# Patient Record
Sex: Male | Born: 1945
Health system: Southern US, Community
[De-identification: ages and names within clinical notes are randomized; demographics above are authoritative.]

## PROBLEM LIST (undated history)

## (undated) DIAGNOSIS — K611 Rectal abscess: Secondary | ICD-10-CM

## (undated) DIAGNOSIS — J449 Chronic obstructive pulmonary disease, unspecified: Secondary | ICD-10-CM

## (undated) DIAGNOSIS — R7301 Impaired fasting glucose: Secondary | ICD-10-CM

## (undated) DIAGNOSIS — I739 Peripheral vascular disease, unspecified: Secondary | ICD-10-CM

## (undated) DIAGNOSIS — M169 Osteoarthritis of hip, unspecified: Secondary | ICD-10-CM

## (undated) DIAGNOSIS — I2510 Atherosclerotic heart disease of native coronary artery without angina pectoris: Secondary | ICD-10-CM

## (undated) DIAGNOSIS — I10 Essential (primary) hypertension: Secondary | ICD-10-CM

## (undated) DIAGNOSIS — E119 Type 2 diabetes mellitus without complications: Secondary | ICD-10-CM

## (undated) DIAGNOSIS — H3530 Unspecified macular degeneration: Secondary | ICD-10-CM

## (undated) DIAGNOSIS — M1710 Unilateral primary osteoarthritis, unspecified knee: Secondary | ICD-10-CM

## (undated) DIAGNOSIS — N189 Chronic kidney disease, unspecified: Secondary | ICD-10-CM

## (undated) DIAGNOSIS — Z720 Tobacco use: Secondary | ICD-10-CM

## (undated) DIAGNOSIS — M179 Osteoarthritis of knee, unspecified: Secondary | ICD-10-CM

## (undated) DIAGNOSIS — I219 Acute myocardial infarction, unspecified: Secondary | ICD-10-CM

## (undated) DIAGNOSIS — E7800 Pure hypercholesterolemia, unspecified: Secondary | ICD-10-CM

## (undated) DIAGNOSIS — Z8719 Personal history of other diseases of the digestive system: Secondary | ICD-10-CM

## (undated) HISTORY — DX: Impaired fasting glucose: R73.01

## (undated) HISTORY — DX: Osteoarthritis of knee, unspecified: M17.9

## (undated) HISTORY — DX: Unilateral primary osteoarthritis, unspecified knee: M17.10

## (undated) HISTORY — PX: CORONARY STENT PLACEMENT: SHX1402

## (undated) HISTORY — DX: Peripheral vascular disease, unspecified: I73.9

## (undated) HISTORY — DX: Tobacco use: Z72.0

## (undated) HISTORY — DX: Atherosclerotic heart disease of native coronary artery without angina pectoris: I25.10

## (undated) HISTORY — PX: CORONARY ANGIOPLASTY: SHX604

## (undated) HISTORY — DX: Essential (primary) hypertension: I10

## (undated) HISTORY — DX: Type 2 diabetes mellitus without complications: E11.9

## (undated) HISTORY — DX: Chronic obstructive pulmonary disease, unspecified: J44.9

## (undated) HISTORY — DX: Osteoarthritis of hip, unspecified: M16.9

## (undated) HISTORY — DX: Pure hypercholesterolemia, unspecified: E78.00

---

## 1998-04-06 ENCOUNTER — Inpatient Hospital Stay (HOSPITAL_COMMUNITY): Admission: AD | Admit: 1998-04-06 | Discharge: 1998-04-09 | Payer: Self-pay | Admitting: *Deleted

## 2001-02-08 ENCOUNTER — Encounter (INDEPENDENT_AMBULATORY_CARE_PROVIDER_SITE_OTHER): Payer: Self-pay | Admitting: Specialist

## 2001-02-08 ENCOUNTER — Other Ambulatory Visit: Admission: RE | Admit: 2001-02-08 | Discharge: 2001-02-08 | Payer: Self-pay | Admitting: Gastroenterology

## 2004-07-08 ENCOUNTER — Ambulatory Visit: Payer: Self-pay | Admitting: *Deleted

## 2004-10-07 ENCOUNTER — Ambulatory Visit: Payer: Self-pay | Admitting: Family Medicine

## 2004-10-13 ENCOUNTER — Ambulatory Visit: Payer: Self-pay | Admitting: Family Medicine

## 2005-04-22 ENCOUNTER — Ambulatory Visit: Payer: Self-pay | Admitting: Family Medicine

## 2005-04-26 ENCOUNTER — Ambulatory Visit: Payer: Self-pay | Admitting: Family Medicine

## 2005-05-24 ENCOUNTER — Ambulatory Visit: Payer: Self-pay | Admitting: Family Medicine

## 2005-07-11 HISTORY — PX: INCISE AND DRAIN ABCESS: PRO64

## 2005-09-16 ENCOUNTER — Ambulatory Visit: Payer: Self-pay | Admitting: Cardiology

## 2005-09-19 ENCOUNTER — Ambulatory Visit: Payer: Self-pay | Admitting: Cardiology

## 2005-12-27 ENCOUNTER — Ambulatory Visit: Payer: Self-pay | Admitting: Cardiology

## 2005-12-29 ENCOUNTER — Ambulatory Visit: Payer: Self-pay

## 2006-01-09 ENCOUNTER — Ambulatory Visit: Payer: Self-pay | Admitting: Cardiovascular Disease

## 2006-01-25 LAB — HM COLONOSCOPY

## 2006-04-13 ENCOUNTER — Ambulatory Visit: Payer: Self-pay | Admitting: Cardiology

## 2006-05-31 ENCOUNTER — Ambulatory Visit: Payer: Self-pay | Admitting: Family Medicine

## 2006-05-31 ENCOUNTER — Ambulatory Visit (HOSPITAL_COMMUNITY): Admission: EM | Admit: 2006-05-31 | Discharge: 2006-06-01 | Payer: Self-pay | Admitting: Emergency Medicine

## 2006-05-31 ENCOUNTER — Encounter (INDEPENDENT_AMBULATORY_CARE_PROVIDER_SITE_OTHER): Payer: Self-pay | Admitting: Specialist

## 2006-06-02 ENCOUNTER — Emergency Department (HOSPITAL_COMMUNITY): Admission: EM | Admit: 2006-06-02 | Discharge: 2006-06-03 | Payer: Self-pay | Admitting: Emergency Medicine

## 2006-09-05 ENCOUNTER — Ambulatory Visit: Payer: Self-pay | Admitting: Family Medicine

## 2006-11-03 ENCOUNTER — Ambulatory Visit: Payer: Self-pay | Admitting: Cardiology

## 2007-05-02 ENCOUNTER — Ambulatory Visit: Payer: Self-pay | Admitting: Cardiology

## 2007-07-09 ENCOUNTER — Ambulatory Visit: Payer: Self-pay | Admitting: Family Medicine

## 2008-02-27 ENCOUNTER — Telehealth: Payer: Self-pay | Admitting: Family Medicine

## 2008-04-14 ENCOUNTER — Ambulatory Visit: Payer: Self-pay | Admitting: Family Medicine

## 2008-04-14 ENCOUNTER — Telehealth (INDEPENDENT_AMBULATORY_CARE_PROVIDER_SITE_OTHER): Payer: Self-pay | Admitting: *Deleted

## 2008-05-07 ENCOUNTER — Ambulatory Visit: Payer: Self-pay | Admitting: Cardiology

## 2008-06-20 DIAGNOSIS — E785 Hyperlipidemia, unspecified: Secondary | ICD-10-CM

## 2008-06-20 DIAGNOSIS — I2510 Atherosclerotic heart disease of native coronary artery without angina pectoris: Secondary | ICD-10-CM | POA: Insufficient documentation

## 2008-09-24 ENCOUNTER — Telehealth: Payer: Self-pay | Admitting: Family Medicine

## 2009-05-08 ENCOUNTER — Ambulatory Visit: Payer: Self-pay | Admitting: Cardiology

## 2009-05-08 DIAGNOSIS — I10 Essential (primary) hypertension: Secondary | ICD-10-CM

## 2009-05-11 ENCOUNTER — Encounter: Payer: Self-pay | Admitting: Cardiology

## 2009-05-11 ENCOUNTER — Ambulatory Visit: Payer: Self-pay | Admitting: Internal Medicine

## 2009-05-11 DIAGNOSIS — R5383 Other fatigue: Secondary | ICD-10-CM

## 2009-05-11 DIAGNOSIS — R5381 Other malaise: Secondary | ICD-10-CM

## 2009-05-14 ENCOUNTER — Encounter: Payer: Self-pay | Admitting: Family Medicine

## 2009-05-14 LAB — CONVERTED CEMR LAB
ALT: 20 units/L (ref 0–53)
Alkaline Phosphatase: 47 units/L (ref 39–117)
Glucose, Bld: 143 mg/dL — ABNORMAL HIGH (ref 70–99)
HCT: 52.4 % — ABNORMAL HIGH (ref 39.0–52.0)
Hgb A1c MFr Bld: 6.5 % — ABNORMAL HIGH (ref 4.6–6.1)
MCV: 96.5 fL (ref 78.0–100.0)
Potassium: 4.7 meq/L (ref 3.5–5.3)
RDW: 14.4 % (ref 11.5–15.5)
Sodium: 142 meq/L (ref 135–145)
Total Bilirubin: 1.4 mg/dL — ABNORMAL HIGH (ref 0.3–1.2)
Total Protein: 6.5 g/dL (ref 6.0–8.3)

## 2009-05-19 LAB — CONVERTED CEMR LAB
Cholesterol: 144 mg/dL (ref 0–200)
HDL: 30 mg/dL — ABNORMAL LOW (ref >39)
LDL Cholesterol: 60 mg/dL (ref 0–99)
Total CHOL/HDL Ratio: 4.8
Triglycerides: 271 mg/dL — ABNORMAL HIGH (ref <150)
VLDL: 54 mg/dL — ABNORMAL HIGH (ref 0–40)

## 2009-05-22 ENCOUNTER — Telehealth: Payer: Self-pay | Admitting: Cardiology

## 2009-06-24 ENCOUNTER — Encounter (INDEPENDENT_AMBULATORY_CARE_PROVIDER_SITE_OTHER): Payer: Self-pay | Admitting: *Deleted

## 2009-08-11 ENCOUNTER — Telehealth: Payer: Self-pay | Admitting: Family Medicine

## 2009-11-16 ENCOUNTER — Telehealth: Payer: Self-pay | Admitting: Cardiology

## 2009-12-17 ENCOUNTER — Telehealth: Payer: Self-pay | Admitting: Cardiology

## 2010-01-13 ENCOUNTER — Telehealth (INDEPENDENT_AMBULATORY_CARE_PROVIDER_SITE_OTHER): Payer: Self-pay | Admitting: *Deleted

## 2010-01-14 ENCOUNTER — Ambulatory Visit: Payer: Self-pay | Admitting: Family Medicine

## 2010-01-15 LAB — CONVERTED CEMR LAB
ALT: 28 units/L (ref 0–53)
BUN: 13 mg/dL (ref 6–23)
Calcium: 9.4 mg/dL (ref 8.4–10.5)
Chloride: 105 meq/L (ref 96–112)
Creatinine, Ser: 1.1 mg/dL (ref 0.4–1.5)
Glucose, Bld: 134 mg/dL — ABNORMAL HIGH (ref 70–99)
HDL: 35 mg/dL — ABNORMAL LOW (ref >39.00)
Total Bilirubin: 1.7 mg/dL — ABNORMAL HIGH (ref 0.3–1.2)
Total CHOL/HDL Ratio: 4

## 2010-01-19 ENCOUNTER — Ambulatory Visit: Payer: Self-pay | Admitting: Family Medicine

## 2010-01-19 DIAGNOSIS — R7309 Other abnormal glucose: Secondary | ICD-10-CM

## 2010-01-19 LAB — CONVERTED CEMR LAB: Blood Glucose, Fasting: 144 mg/dL

## 2010-01-22 ENCOUNTER — Telehealth: Payer: Self-pay | Admitting: Family Medicine

## 2010-01-22 ENCOUNTER — Telehealth: Payer: Self-pay | Admitting: Cardiology

## 2010-01-25 ENCOUNTER — Encounter: Payer: Self-pay | Admitting: Family Medicine

## 2010-04-15 ENCOUNTER — Telehealth (INDEPENDENT_AMBULATORY_CARE_PROVIDER_SITE_OTHER): Payer: Self-pay | Admitting: *Deleted

## 2010-05-05 ENCOUNTER — Telehealth (INDEPENDENT_AMBULATORY_CARE_PROVIDER_SITE_OTHER): Payer: Self-pay | Admitting: *Deleted

## 2010-06-30 ENCOUNTER — Telehealth: Payer: Self-pay | Admitting: Family Medicine

## 2010-07-07 ENCOUNTER — Telehealth: Payer: Self-pay | Admitting: Cardiology

## 2010-08-10 NOTE — Miscellaneous (Signed)
  Medications Added LOVASTATIN 40 MG  TABS (LOVASTATIN) Take 1 tablet by mouth once a day       Clinical Lists Changes  Medications: Changed medication from LOVASTATIN 40 MG  TABS (LOVASTATIN) Take 2 tablets daily to LOVASTATIN 40 MG  TABS (LOVASTATIN) Take 1 tablet by mouth once a day

## 2010-08-10 NOTE — Progress Notes (Signed)
Summary: sorethroat  Phone Note Call from Patient Call back at [phone]   Caller: Patient Call For: Shaune Leeks MD Summary of Call: Pt walked in with sorethroat and painful to swallow,dry cough on and off, ?fever, started 08/10/09. Pt taking Alka Seltzer, and Cepacol lozenge if needed. If condition changes or worsens pt will call back. Pt has appt to see Dr Hetty Ely 08/12/09 at 10:30am. Initial call taken by: Lewanda Rife LPN,  August 11, 2009 8:48 AM

## 2010-08-10 NOTE — Progress Notes (Signed)
Summary: MED QUESTION  Phone Note Call from Patient Call back at [phone]   Summary of Call: PT HAS A QUESTION ABOUT HIS 2 REFILLS THE DOSE WAS CHANGED AND HE IS NOT SURE WHY.  LOVASTIN WAS DECREASED AND METOPROLOL WAS INCREASED.  PLEASE CALL HIM BACK Initial call taken by: Park Breed,  Nov 16, 2009 2:08 PM  Follow-up for Phone Call        Called spoke with pt pt states refills for Metoprolol and Lovastatin were picked up and the dosages and amts taken varied from previous refills.  [facility]ed [facility] spoke with pharmacist pt's Metoprolol rx has been 25mg  two times a day since 03/23/09 and pt's Lovastating rx has been 40mg  2 tabs daily since 01/22/09.  Will correct on medication list and refill accordingly.  Follow-up by: Cloyde Reams RN,  Nov 17, 2009 10:36 AM  Additional Follow-up for Phone Call Additional follow up Details #1::        Called spoke with pt aware rx refills have been changed to match previous dosages.  Pt aware to take the current refill Metoprolol 50mg  1/2 tablet two times a day until gone to prevent wasting them.  Additional Follow-up by: Cloyde Reams RN,  Nov 17, 2009 10:42 AM    New/Updated Medications: METOPROLOL TARTRATE 25 MG TABS (METOPROLOL TARTRATE) Take one tablet by mouth twice a day LOVASTATIN 40 MG  TABS (LOVASTATIN) Take 2 tablets daily Prescriptions: LOVASTATIN 40 MG  TABS (LOVASTATIN) Take 2 tablets daily  #60 x 3   Entered by:   Cloyde Reams RN   Authorized by:   Marca Ancona, MD   Signed by:   Cloyde Reams RN on 11/17/2009   Method used:   Electronically to        [id number]       [address]       [place], [place]  [address]       Ph: [phone]       Fax: [phone]   RxID:   [id number] METOPROLOL TARTRATE 25 MG TABS (METOPROLOL TARTRATE) Take one tablet by mouth twice a day  #60 x 3   Entered by:   Cloyde Reams RN   Authorized by:   Marca Ancona, MD   Signed by:   Cloyde Reams RN on 11/17/2009   Method used:   Electronically to        [id number]       [address]       [place], [place]  [address]       Ph: [phone]       Fax: [phone]   RxID:   [id number]

## 2010-08-10 NOTE — Assessment & Plan Note (Signed)
Summary: CPX/TRANSFER FROM DR SCHALLER/CLE   Vital Signs:  Patient profile:   65 year old male Height:      206 inches Weight:      204.75 pounds BMI:     3.40 Temp:     98 degrees F oral Pulse rate:   60 / minute Pulse rhythm:   regular BP sitting:   144 / 90  (left arm) Cuff size:   regular  Vitals Entered By: Delilah Shan CMA Brogan Martis Dull) (January 19, 2010 8:12 AM) CC: CPX - Transfer from [facility]   History of Present Illness: CPE- See prev med.   Increased sugar noted on labs (fasting >125 twice recently), but patient had steroid injection for plantar fasciitis last week (this predates the labs).  No known h/o DM2 "but my sugar has been borderline for awhile and diabetes runs in the family."  Hypertension:      Using medication without problems or lightheadedness:yes Chest pain with exertion:no Edema:no Short of breath:no Average home BPs: 120s/80s.  Other issues: no   Elevated Cholesterol: Using medications without problems:yes Muscle aches: no Other complaints: no  Skin tag L axilla has been bothering the patient.   Problems Prior to Update: 1)  Physical Examination  (ICD-V70.0) 2)  Hyperglycemia  (ICD-790.29) 3)  Special Screening Malignant Neoplasm of Prostate  (ICD-V76.44) 4)  Fatigue  (ICD-780.79) 5)  Hypertension, Unspecified  (ICD-401.9) 6)  Hyperlipidemia-mixed  (ICD-272.4) 7)  Cad, Native Vessel  (ICD-414.01)  Current Medications (verified): 1)  Metoprolol Tartrate 25 Mg Tabs (Metoprolol Tartrate) .... Take One Tablet By Mouth Twice A Day 2)  Plavix 75 Mg  Tabs (Clopidogrel Bisulfate) .Marland Kitchen.. 1 Daily 3)  Lisinopril 5 Mg  Tabs (Lisinopril) .Marland Kitchen.. 1 Tablet Daily 4)  Lovastatin 40 Mg  Tabs (Lovastatin) .... Take 2 Tablets Daily 5)  Adult Aspirin Ec Low Strength 81 Mg  Tbec (Aspirin) .Marland Kitchen.. 1 Daily 6)  Fish Oil   Oil (Fish Oil) .Marland Kitchen.. 1 Two Times A Day 7)  Nitroquick 0.4 Mg  Subl (Nitroglycerin) .Marland Kitchen.. 1 Tablet As Directed 8)  Antivert 25 Mg  Tabs (Meclizine Hcl) .... One  Tab By Mouth Every 6 Hrs As Needed Dizziness. 9)  Proair Hfa 108 (90 Base) Mcg/act Aers (Albuterol Sulfate) .... 2 Puffs Every 4 Hours As Needed Wheezing 10)  Niaspan 1000 Mg Cr-Tabs (Niacin (Antihyperlipidemic)) .Marland Kitchen.. 1 Tab By Mouth At Bedtime 11)  Glucosamine-Chondroitin   Caps (Glucosamine-Chondroit-Vit C-Mn) .... Take 1 Tablet By Mouth Two Times A Day  Allergies: No Known Drug Allergies  Past History:  Past Medical History: 1. Coronary artery disease.  The patient had a myocardial infarction in 1999 and had a stent placed in his LAD.  He had an inferior MI in 2005 and had a drug-eluting stent placed in his RCA.  His most recent functional study was Myoview in June 2007 with an EF of 61% and no evidence for ischemia. 2. Hypercholesterolemia. 3. Osteoarthritis of the hip and the knee. 4. Prior tobacco abuse.  5.Impaired fasting glucose, elevated after steroid injection  Family History: Reviewed history from 05/08/2009 and no changes required. Father: DECEASED 28;  CHF Mother: DECEASED 28: OLD AGE  Social History: Reviewed history from 05/08/2009 and no changes required. 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  He was a past smoker.  He states that he will smoke maybe 1 cigarette a month or so still.  He lives in [place].  Etoh: occ beer on the weekend.  Married 46  years. 2 grown daughters, 7 grandkids, 1 great grandchild  Review of Systems       See HPI.  Otherwise noncontributory. No CP.  No other complaints.  Doing well and at baseline level of health.   Physical Exam  General:  GEN: nad, alert and oriented HEENT: mucous membranes moist NECK: supple w/o LA, no bruit CV: rrr.  no murmur PULM: ctab, no inc wob ABD: soft, +bs EXT: no edema SKIN: no acute rash  2+ radial pulses Stool heme neg.  Prostate:  Prostate gland firm and smooth, no enlargement, nodularity, tenderness, mass, asymmetry or induration.   Impression & Recommendations:  Problem #  1:  Preventive Health Care (ICD-V70.0) Stool heme neg.  colonoscopy done 2007.  Tdap today, will check with coverage on zostavax.  Due for PNA vaccine- patient can get this when he turns 64 and will then be up to date for that.  Flu shot encouraged.  Labs reviewed with patient.  PSA not elevated.    Problem # 2:  HYPERTENSION, UNSPECIFIED (ICD-401.9) No change in meds.  Controlled based on outside readings.  tolerating meds.  His updated medication list for this problem includes:    Metoprolol Tartrate 25 Mg Tabs (Metoprolol tartrate) .Marland Kitchen... Take one tablet by mouth twice a day    Lisinopril 5 Mg Tabs (Lisinopril) .Marland Kitchen... 1 tablet daily  His updated medication list for this problem includes:    Metoprolol Tartrate 25 Mg Tabs (Metoprolol tartrate) .Marland Kitchen... Take one tablet by mouth twice a day    Lisinopril 5 Mg Tabs (Lisinopril) .Marland Kitchen... 1 tablet daily  Problem # 3:  HYPERLIPIDEMIA-MIXED (ICD-272.4) Tolerating meds.  No change.  labs reviewed.  His updated medication list for this problem includes:    Lovastatin 40 Mg Tabs (Lovastatin) .Marland Kitchen... Take 2 tablets daily    Niaspan 1000 Mg Cr-tabs (Niacin (antihyperlipidemic)) .Marland Kitchen... 1 tab by mouth at bedtime  His updated medication list for this problem includes:    Lovastatin 40 Mg Tabs (Lovastatin) .Marland Kitchen... Take 2 tablets daily    Niaspan 1000 Mg Cr-tabs (Niacin (antihyperlipidemic)) .Marland Kitchen... 1 tab by mouth at bedtime  Problem # 4:  HYPERGLYCEMIA (ICD-790.29) Recheck fasting glucose in 6 months.  Unclear of impact of recent steroid injection and if patient truely has DM2 now.  Pt to work on diet and exercise along with weight in meantime.  He understands the plan.    Complete Medication List: 1)  Metoprolol Tartrate 25 Mg Tabs (Metoprolol tartrate) .... Take one tablet by mouth twice a day 2)  Plavix 75 Mg Tabs (Clopidogrel bisulfate) .Marland Kitchen.. 1 daily 3)  Lisinopril 5 Mg Tabs (Lisinopril) .Marland Kitchen.. 1 tablet daily 4)  Lovastatin 40 Mg Tabs (Lovastatin) .... Take 2  tablets daily 5)  Adult Aspirin Ec Low Strength 81 Mg Tbec (Aspirin) .Marland Kitchen.. 1 daily 6)  Fish Oil Oil (Fish oil) .Marland Kitchen.. 1 two times a day 7)  Nitroquick 0.4 Mg Subl (Nitroglycerin) .Marland Kitchen.. 1 tablet as directed 8)  Antivert 25 Mg Tabs (Meclizine hcl) .... One tab by mouth every 6 hrs as needed dizziness. 9)  Proair Hfa 108 (90 Base) Mcg/act Aers (Albuterol sulfate) .... 2 puffs every 4 hours as needed wheezing 10)  Niaspan 1000 Mg Cr-tabs (Niacin (antihyperlipidemic)) .Marland Kitchen.. 1 tab by mouth at bedtime 11)  Glucosamine-chondroitin Caps (Glucosamine-chondroit-vit c-mn) .... Take 1 tablet by mouth two times a day  Other Orders: Tdap => 31yrs IM ([address]) Admin 1st Vaccine ([address])  Patient Instructions: 1)  Please schedule a follow-up appointment  in 6 months .  I want to recheck your fasting sugar at that point.  Work on [employer] and exercise in the meantime.   Prescriptions: NIASPAN 1000 MG CR-TABS (NIACIN (ANTIHYPERLIPIDEMIC)) 1 tab by mouth at bedtime  #30 x 12   Entered and Authorized by:   Crawford Givens MD   Signed by:   Crawford Givens MD on 01/19/2010   Method used:   Electronically to        [id number]       [address]       [place], [place]  [address]       Ph: [phone]       Fax: [phone]   RxID:   [id number] PROAIR HFA 108 (90 BASE) MCG/ACT AERS (ALBUTEROL SULFATE) 2 puffs every 4 hours as needed wheezing  #1 x 1   Entered and Authorized by:   Crawford Givens MD   Signed by:   Crawford Givens MD on 01/19/2010   Method used:   Electronically to        [id number]       [address]       [place], [place]  [address]       Ph: [phone]       Fax: [phone]   RxID:   [id number] ANTIVERT 25 MG  TABS (MECLIZINE HCL) one tab by mouth every 6 hrs as needed dizziness.  #30 x 1   Entered and Authorized by:   Crawford Givens MD   Signed by:   Crawford Givens MD on 01/19/2010   Method used:   Electronically to        [id number]       [address]       [place], [place]  [address]       Ph: [phone]       Fax: [phone]   RxID:   [id number] NITROQUICK 0.4 MG  SUBL (NITROGLYCERIN) 1 TABLET AS DIRECTED  #25 x 1   Entered and Authorized by:   Crawford Givens MD   Signed by:   Crawford Givens MD on 01/19/2010   Method used:   Electronically to        [id number]       [address]       [place], [place]  [address]       Ph: [phone]       Fax: [phone]   RxID:   [id number] LOVASTATIN 40 MG  TABS (LOVASTATIN) Take 2 tablets daily  #30 Tablet x 12   Entered and Authorized by:   Crawford Givens MD   Signed by:   Crawford Givens MD on 01/19/2010   Method used:   Electronically to        [id number]       [address]       [place], [place]  [address]       Ph: [phone]       Fax: [phone]   RxID:   [id number] LISINOPRIL 5 MG  TABS (LISINOPRIL) 1 TABLET DAILY  #30 Tablet x 12   Entered and Authorized by:   Crawford Givens MD   Signed by:   Crawford Givens MD on 01/19/2010   Method used:   Electronically to        [id number]       [phone]  [address]       [place], [place]  [address]       Ph: [phone]       Fax: [phone]   RxID:   [id number] PLAVIX 75 MG  TABS (CLOPIDOGREL BISULFATE) 1 DAILY  #30 Tablet x 12   Entered and Authorized by:   Crawford Givens MD   Signed by:   Crawford Givens MD on 01/19/2010   Method used:   Electronically to        [id number]       [address]       [place], [place]  [address]       Ph: [phone]       Fax: [phone]   RxID:   [id number] METOPROLOL TARTRATE 25 MG TABS (METOPROLOL TARTRATE) Take one tablet by mouth twice a day  #60 x 12   Entered and Authorized by:   Crawford Givens MD   Signed by:   Crawford Givens MD on 01/19/2010   Method used:   Electronically to        [id number]       [address]       [place], [place]  [address]       Ph: [phone]       Fax: [phone]   RxID:   [id number]   Current Allergies (reviewed today): No known allergies   Laboratory Results   Blood Tests   Date/Time Received: January 19, 2010 8:20 AM   Glucose (fasting): 144 mg/dL   (Normal Range: 09-470)       Immunizations Administered:  Tetanus Vaccine:    Vaccine Type: Tdap    Site: left deltoid    Mfr: GlaxoSmithKline    Dose: 0.5 ml    Route: IM    Given by: Delilah Shan CMA (AAMA)    Exp. Date: 10/02/2011    Lot #: [id number]    VIS given: 05/29/07 version given January 19, 2010.

## 2010-08-10 NOTE — Progress Notes (Signed)
Summary: lovastatin   Phone Note Call from Patient Call back at Home Phone [phone]   Caller: Patient Call For: Crawford Givens MD Summary of Call: Patient calling questioning how many of his lovastatin he should be taking daily. He says that he has always taken 1 daily and for some reason his rx now says 2 daily. It was changed on his med list 11-17-09. He called dr. Alford Highland office and they told him it must have been a mistake or Dr. Hetty Ely could have changed it, but to check with our office on how he should be taking this (see phone note). Please advise.  Initial call taken by: Melody Comas,  January 22, 2010 5:15 PM  Follow-up for Phone Call        My understanding was that the patient was taking (and tolerating) 2 tabs a day.  I would continue this.  Please make sure he has enough for the rx (ie 60 per month or 180 for 90 days) with 1 year worth of refills.  thanks.  Follow-up by: Crawford Givens MD,  January 24, 2010 4:30 PM  Additional Follow-up for Phone Call Additional follow up Details #1::        Spoke with patient's wife who knew all about the mix-up.  She handles his medications.  She says she thinks it is taken care of now.  I apologized for the mix up and advised her to call back if there is any other problem.  Lugene Fuquay CMA Jarmon Javid Dull)  January 25, 2010 9:40 AM

## 2010-08-10 NOTE — Progress Notes (Signed)
Summary: wants phone call   Phone Note Call from Patient   Caller: Patient Call For: [phone] Summary of Call: Patient is asking if you could give him a call regarding his medications.    Initial call taken by: Melody Comas,  May 05, 2010 1:12 PM  Follow-up for Phone Call        Please get more details so I can call him back.  Ask him what specifics he had.  Follow-up by: Crawford Givens MD,  May 05, 2010 1:16 PM  Additional Follow-up for Phone Call Additional follow up Details #1::        Left message on voicemail  to return call with specifics re:  medications.  Lugene Fuquay CMA Duncan Dull)  May 05, 2010 3:10 PM   He is going on Medicare and his insurance will lapse for about 2 weeks while all the paperwork is being processed.  His current insurance ends Monday.  He says he will need his Lovastatin, Lisinopril and Plavix during that time.  He was asking if we had any samples to help him out with.  I don't think we have any of these here in the office. Additional Follow-up by: Delilah Shan CMA Duncan Dull),  May 06, 2010 9:23 AM    Additional Follow-up for Phone Call Additional follow up Details #2::    he should be able to get lovastatin and lisinpril at low cost if he pays cash at the pharmacy.  please send in #30 of each for these.  We don't have any plavix here.  I would have patient ask [facility] clinic.   Additional Follow-up for Phone Call Additional follow up Details #3:: Details for Additional Follow-up Action Taken: I spoke with pt, sent in lisinopril and lovastatin to [facility], 90 day supplies so that he could get them for $10.00.  I dont know what he wants to do about the plavix, advised him we dont have samples.    Lowella Petties CMA, AAMA  May 06, 2010 4:14 PM

## 2010-08-10 NOTE — Progress Notes (Signed)
----   Converted from flag ---- ---- 01/13/2010 2:44 PM, Natasha Chavers CMA (AAMA) wrote:   ---- 01/13/2010 1:20 PM, Graham Duncan MD wrote: PSA v76.44 CMET, lipid 401.1   ---- 01/13/2010 7:52 AM, Natasha Chavers CMA (AAMA) wrote: Good Morning! 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 Thanks Tasha ------------------------------ 

## 2010-08-10 NOTE — Progress Notes (Signed)
----   Converted from flag ---- ---- 04/15/2010 2:34 PM, Daine Gip wrote: thanks Molli Hazard... I will send this to billing to have level removed.Marland KitchenMarland KitchenAram Beecham   ---- 04/15/2010 2:34 PM, Katina Dung. Ayinde wrote: Aram Beecham,  I do not see a documentation for skin tag removal for DOS  7/12, also I looked in IDX i did not see that it was billed. However, the additional LV 4 service that was billed can be removed.   The labs that were order for the management of chronics (HTN, lipids, and hyperglycemia) are part of routine physical/ disease maintenance and reviewing them should not count as additional work to necessity a OV. Dr. Para March might have mistaked this visit for a medicare annual wellness visit.   I will have OV removed.   Thanks Molli Hazard  ---- 04/13/2010 3:32 PM, Daine Gip wrote: Zenaida Deed you review dos 01-19-2010 pt has has cpx and office visit on the same day for a skin tag removal.  Pt wants to know if we can charge for the skin tag only instead of an office visit... Hope you are having a good day.Marland KitchenMarland KitchenMarland KitchenAram Beecham ------------------------------  Appended Document:  Sent request to adjust charged to charge corrections. also, called pt left message.Apolinar Junes 04-16-2010

## 2010-08-10 NOTE — Progress Notes (Signed)
----   Converted from flag ---- ---- 01/13/2010 2:44 PM, Liane Comber CMA (AAMA) wrote:   ---- 01/13/2010 1:20 PM, Crawford Givens MD wrote: PSA v76.44 CMET, lipid 401.1   ---- 01/13/2010 7:52 AM, Liane Comber CMA (AAMA) wrote: Peri Jefferson Morning! 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 Thanks Tasha ------------------------------

## 2010-08-10 NOTE — Progress Notes (Signed)
Summary: BLEEDING IN EYE   Phone Note Call from Patient Call back at [phone]   Caller: SELF Call For: [facility] Summary of Call: BLEEDING IN RIGHT EYE-IS ON BLOOD THINNER-WHAT NEEDS TO BE DONE? Initial call taken by: Harlon Flor,  December 17, 2009 2:50 PM  Follow-up for Phone Call        spoke with pt blood not interferring with sight. instructed him that if the bleeding dose interfere with sight to call his eye dr. burst blood vessel should resolve in a week or so.  Follow-up by: Benedict Needy, RN,  December 17, 2009 5:33 PM

## 2010-08-10 NOTE — Progress Notes (Signed)
Summary: MEDICATION QUESTION   Phone Note Call from Patient Call back at [phone]   Summary of Call: PATIENT CALLED AND HE IS ON LOVASTATIN 40 MG AND HE IS NOT SURE IF HE SHOULD BE TAKING ONE OR TWO A DAY.  HE THINKS HIS PRIMARY CARE MD CHANGED IT BUT WANTED TO CHECK WITH [facility] TO SEE WHAT HE SHOULD BE TAKING. Initial call taken by: [place] Carbo,  January 22, 2010 2:01 PM  Follow-up for Phone Call        pt told that Dr Hetty Ely has been following his lipids therfore Dr Para March will need to take care of this since he will be following it now. Pt understands and will call there. Follow-up by: Hardin Negus, RMA,  January 22, 2010 3:57 PM

## 2010-08-12 NOTE — Progress Notes (Signed)
Summary: question on meds pt called gso off   Phone Note Call from Patient Call back at [phone]   Caller: Patient Reason for Call: Talk to Nurse Summary of Call: question on meds re gneric Initial call taken by: Roe Coombs,  June 30, 2010 2:44 PM  Follow-up for Phone Call        Pt is asking if he can be prescribed something less costly than plavix and niaspan, he says Dr Shirlee Latch has been refilling these, but initial note was forwarded to Dr. Lianne Bushy office. Dr Para March is out of the office until 12/28 and pt needs refills now.  He doesnt want to pay for another refill on these meds if he doesnt have to. Follow-up by: Lowella Petties CMA, AAMA,  July 01, 2010 2:58 PM  Additional Follow-up for Phone Call Additional follow up Details #1::        Patient called again to see if Dr. Para March could change his medications.  Advised patient that Dr. Para March is out of the office until 12/28 but would address his concern when he returns. Additional Follow-up by: Linde Gillis CMA [name]),  July 02, 2010 12:03 PM    Additional Follow-up for Phone Call Additional follow up Details #2::    If cards thinks he should be on the meds, then I wouldn't change them.  I don't know of cheaper alternatives now.  I would see if cards has any samples of either. If not, then I would see if he can get on med assistance program.   If he is still unable to get the plavix, then have him as cards clinic about what they want him to do about his aspirin dose.   I am not advocating a change in his meds.  Crawford Givens MD  July 07, 2010 1:01 PM   Patient Advised.   He says he has checked into a medication assistance program but didn't qualify.  He likely will qualify when he retires.  Lugene Fuquay CMA [name])  July 07, 2010 2:23 PM

## 2010-08-12 NOTE — Progress Notes (Signed)
Summary: Holding Plavix   Phone Note Call from Patient   Caller: Patient Summary of Call: Pt called stating he is having dental work coming up and they want to hold Plavix for 4 days. Pt has been on Plavix for 10 years. Can I tell pt this is ok to hold? Also, pt inquiring if there are different meds that could replace Plavix and Niaspan due to cost, or if he needs to continue on Plavix? Told pt I will leave samples for Plavix to help, and he does not qualify for assistance program. Please advise. Initial call taken by: Lanny Hurst RN,  July 07, 2010 4:40 PM     Appended Document: Holding Plavix At this point, he can stop Plavix.  Increase aspirin to 162 mg daily.   Appended Document: Holding Plavix pt notified. /MES

## 2010-11-23 NOTE — Assessment & Plan Note (Signed)
[facility]                            CARDIOLOGY OFFICE NOTE   [name]                      MRN:          [id number]  DATE:05/07/2008                            DOB:          09/16/1945    PRIMARY CARE PHYSICIAN:  Arta Silence, MD   HISTORY OF PRESENT ILLNESS:  This is a 65 year old with history of  coronary artery disease status post PCI in 1999 and 2005, who presents  to Cardiology Clinic for his annual followup.  The patient over the last  year has been doing quite well from a cardiovascular standpoint.  He has  had no episodes of chest pain or tightness.  He continues to be quite  active.  His only limitation is hip and knee osteoarthritis.  He does  house and yard work.  He mows and trims.  He does not get short of  breath with moderate exertion.  He is able to climb flight of steps with  no trouble.  He has no orthopnea or PND.  No syncopal episodes.  No  palpitations.  His only recent problem has been about a month ago, when  he had an episode of bronchitis with coughing and congestion.  He was  seen by Dr. Patsy Lager and was treated and is now feeling back to normal.  His cholesterol has been followed by Dr. Hetty Ely.   PAST MEDICAL HISTORY:  1. Coronary artery disease.  The patient had a myocardial infarction      in 1999, had a stent placed in his LAD.  He did have an inferior MI      in 2005 and had a drug-eluting stent placed in his RCA.  His most      recent functional study was Myoview in June 2007, with an EF of      61%, there was no evidence for ischemia.  2. Hypercholesterolemia.  3. Osteoarthritis of the hip and the knee.  4. Prior tobacco abuse.   MEDICATIONS:  1. Plavix 75 mg daily.  2. Aspirin 81 mg daily.  3. Niaspan 10 mg nightly.  4. Fish oil 2 g daily.  5. Toprol 50 mg b.i.d.  6. Lisinopril 5 mg daily.  7. Lovastatin 40 mg daily.  8. Glucosamine.   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  He was a past smoker.  He states that he will smoke maybe  1 cigarette a month or so still.  He lives in [place].   PHYSICAL EXAMINATION:  VITAL SIGNS:  Blood pressure 118/80, heart rate  68 and regular, and weight is 206 pounds.  GENERAL:  This is a well-developed male in no apparent distress.  NEUROLOGIC:  Alert and oriented x3.  Normal affect.  NECK:  No JVD.  There is no thyroid nodule or thyromegaly.  LUNGS:  Clear to auscultation bilaterally with normal respiratory  effort.  CARDIOVASCULAR:  Heart regular.  S1 and S2.  No S3.  No S4.  No murmur.  No carotid bruit.  No peripheral edema.  A 2+ posterior tibial  pulses  bilaterally.  ABDOMEN:  Soft and nontender.  No hepatosplenomegaly.  EXTREMITIES:  No clubbing or cyanosis.   EKG was reviewed today, showed normal sinus rhythm.  This is a normal  EKG.   ASSESSMENT AND PLAN:  This is a 65 year old with history of coronary  artery disease and hypercholesterolemia, who presents to Cardiology  Clinic for followup.  1. Coronary artery disease.  The patient has had no recent ischemic      symptoms.  He seems quite stable.  I will continue him on his      aspirin and Plavix as we are doing.  He should also continue his      beta-blocker, his ACE inhibitor, and his statin.  2. Hypercholesterolemia.  I do not see recent lipids in our system.      He has been following up with his lipids with Dr. Hetty Ely.  He      states he is going to call the office to get an appointment.  I did      encourage him to go ahead and get his lipids checked.  My goal LDL      for him would be less than 70.  He is on Lovastatin 40, fish oil 2      g daily, and Niaspan.  He is tolerating the Niaspan without any      trouble.  3. Blood pressure.  The patient's blood pressure is well controlled      today at 118/80.  4. He will have a followup with [place] in 1 year unless he has recurrent      cardiac symptoms.     Marca Ancona,  MD  Electronically Signed    DM/MedQ  DD: 05/07/2008  DT: 05/08/2008  Job #: [id number]   cc:   Arta Silence, MD

## 2010-11-23 NOTE — Assessment & Plan Note (Signed)
[facility]                            CARDIOLOGY OFFICE NOTE   May, Brett                      MRN:          [id number]  DATE:05/02/2007                            DOB:          1945-10-17    PRIMARY CARE PHYSICIAN:  Arta Silence, MD   REASON FOR VISIT:  Cardiac follow up.   HISTORY OF PRESENT ILLNESS:  [name] continues to do well.  He is  not having any exertional angina.  He reports this is typically a pain  between his scapula and in the back.  He is not limited by any dyspnea  at this time.  His electrocardiogram shows sinus bradycardia at 57 beats  per minute.  Otherwise, normal without any significant changes.  He is  due to see Dr. Hetty Ely in the near future for lipids.   ALLERGIES:  No known drug allergies.   PRESENT MEDICATION:  1. Plavix 75 mg p.o. daily.  2. Vitamin C 1000 mg daily.  3. Glucosamine 1000 mg p.o. b.i.d.  4. Lovastatin 80 mg p.o. daily.  5. Enteric coated aspirin 81 mg p.o. daily.  6. Niaspan 1000 mg p.o. q.h.s.  7. Lisinopril 20 mg p.o. daily.  8. Omegal-3 supplements.  9. Lopressor 25 mg p.o. b.i.d.  10.Sublingual nitroglycerin 0.4 mg p.r.n.   REVIEW OF SYSTEMS:  As described in history of present illness.   PHYSICAL EXAMINATION:  VITAL SIGNS:  Blood pressure 132/80, heart rate  57, weight 202 pounds.  GENERAL:  The patient is comfortable and in no acute distress.  Normally  nourished.  NECK:  Supple.  No elevated jugular venous pressure.  LUNGS:  Clear without labored breathing.  CARDIAC:  Regular rate and rhythm.  No murmurs, rubs or gallops.  EXTREMITIES:  No pitting edema.   IMPRESSION/RECOMMENDATIONS:  1. Coronary artery disease with previous myocardial infarctions in      1999 with stent placement of the left anterior descending as well      as subsequent inferior wall myocardial infarction in April 2005      with drug eluting stent in the right coronary artery.  The patient  is symptomatically stable, and we will plan to continue medical      therapy and move him to an annual follow up for now.  2. Otherwise, continue regular follow-up with Dr. Hetty Ely.  The      patient is due for a follow up with lipids at that time.  His goal      LDL should be around 70.     Jonelle Sidle, MD  Electronically Signed    SGM/MedQ  DD: 05/02/2007  DT: 05/03/2007  Job #: [id number]   cc:   Arta Silence, MD

## 2010-11-26 NOTE — Assessment & Plan Note (Signed)
[facility]                            CARDIOLOGY OFFICE NOTE   REMINGTON, HIGHBAUGH                      MRN:          [id number]  DATE:11/03/2006                            DOB:          01-Aug-1945    PRIMARY CARE PHYSICIAN:  Dr. Laurita Quint.   REASON FOR VISIT:  Follow up coronary artery disease.   HISTORY OF PRESENT ILLNESS:  [name] returns for a routine visit.  He is doing well without any significant exertional angina or dyspnea.  His electrocardiogram today shows sinus bradycardia at 50 b.p.m. with no  significant change compared to his previous tracing from June of 2007.  I reviewed his medications which are unchanged.   ALLERGIES:  NO KNOWN DRUG ALLERGIES.   PRESENT MEDICATIONS:  1. Plavix 75 mg p.o. daily.  2. Vitamin C 1000 mg p.o. daily.  3. Glucosamine 1000 mg p.o. b.i.d.  4. Lovastatin 80 mg p.o. daily.  5. Enteric coated aspirin 81 mg p.o. daily.  6. Niaspan 1000 mg p.o. q.h.s.  7. Lisinopril 20 mg p.o. daily.  8. Omega-3 Fish Oil supplements.  9. Lopressor 25 mg p.o. b.i.d.  10.Sublingual nitroglycerin 0.4 mg p.r.n.   REVIEW OF SYSTEMS:  As described in the History of Present Illness.  He  does state that in the interim since his last visit he had problem with  a scrotal Staph infection that required drainage and debridement.  He  was on a prolonged course of antibiotics.  He states that he is  essentially back to baseline now.   EXAMINATION:  Blood pressure is 139/83, heart rate is 50, weight is 200  pounds, which is stable.  Patient is comfortable and in no acute  distress.  NECK:  No elevated jugular venous pressure without bruits, no  thyromegaly is noted.  LUNGS:  Clear without labored breathing at rest.  CARDIAC EXAM:  A regular rate and rhythm without murmur or gallop.  EXTREMITIES:  No pitting edema.   IMPRESSION RECOMMENDATION:  1. Coronary disease status post myocardial infarction in 1999 with  stent placement to left anterior descending as well as subsequent      inferior wall myocardial infarction in April of 2005 with drug-      eluting stents placed in the right coronary artery.  Myoview from      last year was low risk and the patient is not manifesting any      active angina at this time.  I will plan to      continue medical therapy and see him back for symptom review in the      next 6 months.  2. Hyperlipidemia, followed by Dr. Hetty Ely.  Recommend LDL control      down around 70.     Jonelle Sidle, MD  Electronically Signed    SGM/MedQ  DD: 11/03/2006  DT: 11/03/2006  Job #: [id number]   cc:   Arta Silence, MD

## 2010-11-26 NOTE — Assessment & Plan Note (Signed)
[facility]                              CARDIOLOGY OFFICE NOTE   Atwell, Mcdanel ELEAZAR KIMMEY                      MRN:          [id number]  DATE:04/13/2006                            DOB:          02-15-1946    REASON FOR VISIT:  Follow up coronary artery disease.   HISTORY OF PRESENT ILLNESS:  I saw [name] initially back in March.  His  history is outlined in the most recent note.  He was seen over the summer  months with some generally decreased exercise tolerance and referred for  adenosine Myoview in June which was overall negative for ischemia with an  ejection fraction of 61%.  Mr.  Anne Hahn states that these prior symptoms have  resolved. He is not reporting any exertional angina or limiting dyspnea on  exertion.  He has had some difficulty with erectile dysfunction.  He has  also reported elevated random glucose levels using a home monitor, stating  that these tend to run between 130-150.  He is not yet on medication and  tells me that he is due to see Dr. Hetty Ely to discuss things further.  I  reviewed his medications, and we talked about considering decreasing his  Lopressor to 25 mg p.o. b.i.d., given some of the aforementioned symptoms,  and also his heart rate of 46-51 at rest over the last two visits.  Otherwise, he is not reporting any major problems.   ALLERGIES:  No known drug allergies.   MEDICATIONS:  1. Lopressor 50 mg p.o. b.i.d.  2. Plavix 75 mg p.o. daily.  3. Vitamin C 1000 mg p.o. daily.  4. Glucosamine 1000 mg p.o. b.i.d.  5. Lovastatin 80 mg p.o. daily.  6. Enteric coated aspirin 81 mg p.o. daily.  7. Niaspan 1000 mg p.o. q.h.s.  8. Lisinopril 20 mg p.o. daily.  9. Fish oil supplements.   REVIEW OF SYSTEMS:  As described in history of present illness and was  negative.   PHYSICAL EXAMINATION:  VITAL SIGNS:  Blood pressure 135/85, heart rate 46,  weight _________ pounds, up from __________.  GENERAL:  The patient  is comfortable and in no acute distress.  HEENT:  Conjunctivae are normal.  Pharynx is clear.  NECK:  Supple without elevated jugular venous pressure, without bruits.  No  thyromegaly noted.  LUNGS:  Clear without labored breathing.  CARDIAC:  Regular rate and rhythm without murmurs, rubs or gallops.  ABDOMEN:  Soft without bruits.  EXTREMITIES:  No pitting edema.   IMPRESSION/RECOMMENDATIONS:  1. Coronary artery disease status post myocardial infarction in 1999 with      subsequent stent placement of the left anterior descending as well as      inferior wall myocardial infarction in April 2005 with drug eluting      stents placed in the right coronary artery.  Recent Myoview was      reassuring with normal ejection fraction.  I have asked him to decrease      his Lopressor to 25 mg p.o. b.i.d. and otherwise continue his present      medications.  I will see him back for symptom review over the next six      months.  2. Probably type 2 diabetes mellitus based on reports of random glucose      levels.  I have encouraged him to see Dr. Hetty Ely for further      management.  He is also due for repeat lipid profile which could be      assessed at that time.  He continues on Lovastatin, and his last LDL      cholesterol was 48 back in March 2007.       Jonelle Sidle, MD     SGM/MedQ  DD:  04/13/2006  DT:  04/14/2006  Job #:  [id number]   cc:   Arta Silence, MD

## 2010-11-26 NOTE — Op Note (Signed)
NAME:  [name], [name] NO.:  [id number]   MEDICAL RECORD NO.:  [id number]          PATIENT TYPE:  INP   LOCATION:  0098                         FACILITY:  [facility]   PHYSICIAN:  Sigmund I. Patsi Sears, M.D.DATE OF BIRTH:  01-22-1946   DATE OF PROCEDURE:  05/31/2006  DATE OF DISCHARGE:                               OPERATIVE REPORT   PREOPERATIVE DIAGNOSES:  Left scrotal abscess.   POSTOPERATIVE DIAGNOSES:  Left scrotal abscess   OPERATIONS:  Incision and drainage of left scrotal abscess (infected  sebaceous cyst, multiple, tracking toward urethra)   SURGEON:  Sigmund I. Patsi Sears, M.D.   ANESTHESIA:  General LMA.   PREPARATION:  After appropriate preanesthesia, the patient was brought  to the operating room, placed on the operating room table in the dorsal  supine position where general LMA anesthesia was induced.  He remained  in this position, where the pubis was prepped with Betadine solution and  draped in usual fashion.   REVIEW OF HISTORY:  This 65 year old male, developed acute erythema,  tenderness and swelling in the left hemiscrotum, seen today by Dr.  Darrick Huntsman in the [place] office, referred for incision and drainage  of abscess.  The patient is diet controlled diabetic.  He has a history  of multiple scrotal sebaceous cysts.   PROCEDURE:  Two areas of subcutaneous abscess were identified, which  tracked toward each other across the left hemiscrotum.  A large incision  and drainage accomplished with cultures obtained.  On the medial side,  the abscess tracks toward the urethra.  The entire of scrotum was  opened, and the testicle identified which appeared to measure 4 x 4 cm,  and was healthy.  The epididymis was quite swollen, suppurative although  there was no individual abscess identified in the epididymis.  Three  liters of irrigation were accomplished, and following pulse lavage  irrigation, double passage Penrose drain was placed to the  dependent  portion of the scrotum and sutured in place with two separate 3-0 Vicryl  sutures.  The testicles were placed on the wound, and the wound closed  with intermittent 3-0  Vicryl sutures, so to reapproximate some of the skin, to promote  healing, but the prior wound was not sealed closed to allow for any  wound drainage.  The patient is given IV Toradol, awakened and taken to  the recovery room in good condition.      Sigmund I. Patsi Sears, M.D.  Electronically Signed     SIT/MEDQ  D:  05/31/2006  T:  06/01/2006  Job:  [id number]   cc:   Arta Silence, MD  Fax: [phone]

## 2010-12-12 ENCOUNTER — Other Ambulatory Visit: Payer: Self-pay | Admitting: Cardiology

## 2010-12-14 ENCOUNTER — Other Ambulatory Visit: Payer: Self-pay | Admitting: Emergency Medicine

## 2010-12-14 MED ORDER — METOPROLOL TARTRATE 25 MG PO TABS: 25.0000 mg | ORAL_TABLET | Freq: Two times a day (BID) | ORAL | Status: DC

## 2010-12-17 ENCOUNTER — Encounter: Payer: Self-pay | Admitting: Cardiology

## 2010-12-24 ENCOUNTER — Ambulatory Visit (INDEPENDENT_AMBULATORY_CARE_PROVIDER_SITE_OTHER): Payer: Medicare Other | Admitting: Cardiology

## 2010-12-24 ENCOUNTER — Encounter: Payer: Self-pay | Admitting: Cardiology

## 2010-12-24 ENCOUNTER — Ambulatory Visit: Payer: Self-pay | Admitting: Cardiology

## 2010-12-24 DIAGNOSIS — E785 Hyperlipidemia, unspecified: Secondary | ICD-10-CM

## 2010-12-24 DIAGNOSIS — J449 Chronic obstructive pulmonary disease, unspecified: Secondary | ICD-10-CM

## 2010-12-24 DIAGNOSIS — I10 Essential (primary) hypertension: Secondary | ICD-10-CM

## 2010-12-24 DIAGNOSIS — I2510 Atherosclerotic heart disease of native coronary artery without angina pectoris: Secondary | ICD-10-CM

## 2010-12-24 MED ORDER — LISINOPRIL 5 MG PO TABS: 20.0000 mg | ORAL_TABLET | ORAL | Status: DC

## 2010-12-24 NOTE — Patient Instructions (Addendum)
Increase Lisinopril to 20 mg daily (take 2 tablets in AM 2 tablets in PM) Call our office if this new dose works for you and we can send in Rx.  Blood Pressure check in two weeks.  Keep a reading of blood pressure and heart rates and bring with you to your blood pressure check.  Your physician recommends that you return for a FASTING lipid profile: 2 weeks (lipid/lft/BMP)  Follow up in one year with Dr. Shirlee Latch.

## 2010-12-26 DIAGNOSIS — J449 Chronic obstructive pulmonary disease, unspecified: Secondary | ICD-10-CM | POA: Insufficient documentation

## 2010-12-26 NOTE — Assessment & Plan Note (Signed)
Will check lipids/LFTs with goal LDL < 70.

## 2010-12-26 NOTE — Assessment & Plan Note (Signed)
Prior smoker, suspect COPD.  He is wheezing on exam today but denies dyspnea or cough.  He uses albuterol as needed.

## 2010-12-26 NOTE — Assessment & Plan Note (Signed)
No exertional symptoms.  It has been a number of years since last PCI so I let him stop Plavix.  He is on ASA 162 mg daily.  He will continue ACEI, metoprolol, and statin.

## 2010-12-26 NOTE — Assessment & Plan Note (Signed)
BP is high.  I will increase lisinopril to 20 mg daily with BMET and BP check in 2 weeks.

## 2010-12-26 NOTE — Progress Notes (Signed)
PCP: Dr. Para March  65 yo with history of CAD s/p LAD and RCA PCIs presents for followup.  He has been doing well in general.  He has not had any exertional chest or back pain (had upper back pain with prior MI).  No significant exertional shortness of breath though he has probably COPD and has been wheezing.  He is active at home, doing a lot of yardwork with no problems.  He has stopped Plavix.  BP has been running high.    ECG: NSR at 57, normal  Labs (7/11): LDL 76, HDl 35, K 4.6, creatinine 1.1  Allergies (verified):  No Known Drug Allergies  Past Medical History: 1. Coronary artery disease.  The patient had a myocardial infarction in 1999 and had a stent placed in his LAD.  He had an inferior MI in 2005 and had a drug-eluting stent placed in his RCA.  His most recent functional study was Myoview in June 2007 with an EF of 61% and no evidence for ischemia. 2. Hypercholesterolemia. 3. Osteoarthritis of the hip and the knee. 4. Probable COPD  Family History: Father: DECEASED 82;  CHF Mother: DECEASED 56: OLD AGE  Social History: The patient works in Airline pilot for a IT trainer.  He was a past smoker.  He states that he will smoke maybe 1 cigarette a month or so still.  He lives in [place].  Review of Systems        All systems reviewed and negative except as per HPI.   Current Outpatient Prescriptions  Medication Sig Dispense Refill  . albuterol (PROAIR HFA) 108 (90 BASE) MCG/ACT inhaler Inhale 2 puffs into the lungs every 4 (four) hours as needed.        Marland Kitchen aspirin (ADULT ASPIRIN EC LOW STRENGTH) 81 MG EC tablet Take 81 mg by mouth daily.       . Fish Oil OIL Take 1 capsule by mouth 2 (two) times daily.        Marland Kitchen GLUCOSAMINE-CHONDROITIN PO Take 1 tablet by mouth 2 (two) times daily.        Marland Kitchen lisinopril (PRINIVIL,ZESTRIL) 5 MG tablet Take 4 tablets (20 mg total) by mouth as directed. Take 2 in AM and 2 in PM. Pt to call if dose is working.  120 tablet  6  . lovastatin  (MEVACOR) 40 MG tablet Take 40 mg by mouth at bedtime.        . meclizine (ANTIVERT) 25 MG tablet Take 25 mg by mouth every 6 (six) hours as needed.        . metoprolol tartrate (LOPRESSOR) 25 MG tablet Take 1 tablet (25 mg total) by mouth 2 (two) times daily.  60 tablet  6  . niacin (NIASPAN) 1000 MG CR tablet Take 1,000 mg by mouth at bedtime.        . nitroGLYCERIN (NITROSTAT) 0.4 MG SL tablet Place 0.4 mg under the tongue as directed.          BP 176/89  Pulse 58  Ht 5\' 9"  (1.753 m)  Wt 206 lb (93.441 kg)  BMI 30.42 kg/m2 General:  Well developed, well nourished, in no acute distress. Neck:  Neck supple, no JVD. No masses, thyromegaly or abnormal cervical nodes. Lungs:  Bilateral wheezes Heart:  Non-displaced PMI, chest non-tender; regular rate and rhythm, S1, S2 without murmurs, rubs or gallops. Carotid upstroke normal, no bruit. Pedals normal pulses. No edema, no varicosities. Abdomen:  Bowel sounds positive; abdomen soft and non-tender without  masses, organomegaly, or hernias noted. No hepatosplenomegaly. Extremities:  No clubbing or cyanosis. Neurologic:  Alert and oriented x 3. Psych:  Normal affect.

## 2011-01-05 ENCOUNTER — Other Ambulatory Visit: Payer: Self-pay | Admitting: *Deleted

## 2011-01-05 MED ORDER — ALBUTEROL SULFATE HFA 108 (90 BASE) MCG/ACT IN AERS: 2.0000 | INHALATION_SPRAY | RESPIRATORY_TRACT | Status: DC | PRN

## 2011-01-07 ENCOUNTER — Other Ambulatory Visit (INDEPENDENT_AMBULATORY_CARE_PROVIDER_SITE_OTHER): Payer: Medicare Other | Admitting: *Deleted

## 2011-01-07 DIAGNOSIS — E785 Hyperlipidemia, unspecified: Secondary | ICD-10-CM

## 2011-01-07 DIAGNOSIS — I2510 Atherosclerotic heart disease of native coronary artery without angina pectoris: Secondary | ICD-10-CM

## 2011-01-07 DIAGNOSIS — R5381 Other malaise: Secondary | ICD-10-CM

## 2011-01-08 LAB — LIPID PANEL
Cholesterol: 132 mg/dL (ref 0–200)
HDL: 29 mg/dL — ABNORMAL LOW (ref >39)
Total CHOL/HDL Ratio: 4.6 Ratio
Triglycerides: 222 mg/dL — ABNORMAL HIGH (ref <150)
VLDL: 44 mg/dL — ABNORMAL HIGH (ref 0–40)

## 2011-01-08 LAB — HEPATIC FUNCTION PANEL
AST: 21 U/L (ref 0–37)
Albumin: 4.2 g/dL (ref 3.5–5.2)
Total Bilirubin: 0.9 mg/dL (ref 0.3–1.2)

## 2011-01-08 LAB — BASIC METABOLIC PANEL
BUN: 19 mg/dL (ref 6–23)
CO2: 24 mEq/L (ref 19–32)
Chloride: 106 mEq/L (ref 96–112)
Potassium: 4.6 mEq/L (ref 3.5–5.3)

## 2011-01-10 ENCOUNTER — Telehealth: Payer: Self-pay | Admitting: *Deleted

## 2011-01-10 NOTE — Telephone Encounter (Signed)
Pt gave BP results 2 weeks after ov on 12/24/10. We had incr his Lisinopril to 20mg  (taking his 5mg  tablets 2 in AM 2 in PM). He has about 5 days left of 5mg  tablet, and will need new Rx. Do you want to continue on 20mg  or change dose with these bp results? Please advise.  6/17-6/28--143/80, 150/86, 167/89, 155/80, 146/82, 159/95, 159/80, 139/76, 139/89, 149/85, 152/91

## 2011-01-12 NOTE — Telephone Encounter (Signed)
Increase lisinopril to 40 mg daily with BP check and BMET in 2 wks.

## 2011-01-13 ENCOUNTER — Other Ambulatory Visit: Payer: Self-pay | Admitting: Cardiology

## 2011-01-13 MED ORDER — LISINOPRIL 40 MG PO TABS: 40.0000 mg | ORAL_TABLET | Freq: Every day | ORAL | Status: DC

## 2011-01-13 NOTE — Telephone Encounter (Signed)
Spoke to pt, notified to incr Lisinopril to 40mg  daily, have sent Rx in to pharmacy. Pt is scheduled to return in 2 weeks for BP check and labs.

## 2011-01-26 ENCOUNTER — Encounter: Payer: Self-pay | Admitting: Family Medicine

## 2011-01-26 ENCOUNTER — Ambulatory Visit (INDEPENDENT_AMBULATORY_CARE_PROVIDER_SITE_OTHER): Payer: Medicare Other | Admitting: Family Medicine

## 2011-01-26 VITALS — BP 144/90 | HR 60 | Temp 98.1°F | Wt 205.4 lb

## 2011-01-26 DIAGNOSIS — R05 Cough: Secondary | ICD-10-CM

## 2011-01-26 MED ORDER — NITROGLYCERIN 0.4 MG SL SUBL: 0.4000 mg | SUBLINGUAL_TABLET | SUBLINGUAL | Status: DC

## 2011-01-26 MED ORDER — FLUTICASONE-SALMETEROL 250-50 MCG/DOSE IN AEPB: 1.0000 | INHALATION_SPRAY | Freq: Two times a day (BID) | RESPIRATORY_TRACT | Status: DC

## 2011-01-26 NOTE — Assessment & Plan Note (Addendum)
ACE related vs COPD/air quality vs post nasal gtt.  Will start advair 250/50, instructed today with samples and he'll call back as needed. We may need to change ACE.  He understood.  Nontoxic.  Not sob.

## 2011-01-26 NOTE — Progress Notes (Signed)
Fluctuating dry cough and fatigue.  Some days are better than others.  Some postnasal drip.  No sputum.  No fevers.  There were some days where he didn't feel like he was getting a good deep breath, could happed at rest.  Noted more over last few weeks.  ACE was increased via cards prev.  No other sig changes other than some dental surgery in the spring. He's off plavix and on 162mg  of ASA.  SABA doesn't help much.  He hadn't noticed a wheeze, but wheeze prev noted by cards.  No heartburn.  Smoking occ cig.  Prev smoked 40 years.  No chest pain.  Sx concurrent with high environmental temp and poor air quality.  Meds, vitals, and allergies reviewed.   ROS: See HPI.  Otherwise, noncontributory.  GEN: nad, alert and oriented HEENT: mucous membranes moist, op w/o erythema NECK: supple w/o LA CV: rrr.  PULM: no inc in wob but B diffuse wheeze ABD: soft, +bs EXT: no edema SKIN: no acute rash

## 2011-01-26 NOTE — Patient Instructions (Signed)
Use the advair 250/50- 1 puff twice a day.  Call me with an update in about 2 weeks, sooner if needed.  Take care.

## 2011-01-28 ENCOUNTER — Encounter: Payer: Self-pay | Admitting: *Deleted

## 2011-01-28 ENCOUNTER — Ambulatory Visit (INDEPENDENT_AMBULATORY_CARE_PROVIDER_SITE_OTHER): Payer: Medicare Other | Admitting: *Deleted

## 2011-01-28 VITALS — BP 161/89 | HR 59 | Ht 69.0 in | Wt 203.0 lb

## 2011-01-28 DIAGNOSIS — Z79899 Other long term (current) drug therapy: Secondary | ICD-10-CM

## 2011-01-28 DIAGNOSIS — I10 Essential (primary) hypertension: Secondary | ICD-10-CM

## 2011-01-28 NOTE — Progress Notes (Signed)
Patient came to office today to have nurse bp check since lisinopril increased to 40mg  2 weeks ago. Patient has taken his medications and hasn't missed any doses. No c/o chest pain,sob,dizziness.

## 2011-01-29 LAB — BASIC METABOLIC PANEL
BUN: 11 mg/dL (ref 6–23)
CO2: 23 mEq/L (ref 19–32)
Calcium: 9.1 mg/dL (ref 8.4–10.5)
Chloride: 105 mEq/L (ref 96–112)
Creat: 0.96 mg/dL (ref 0.50–1.35)
Glucose, Bld: 200 mg/dL — ABNORMAL HIGH (ref 70–99)
Potassium: 4.2 mEq/L (ref 3.5–5.3)
Sodium: 140 mEq/L (ref 135–145)

## 2011-01-31 NOTE — Progress Notes (Signed)
   Patient ID: [name], male    DOB: 02-26-1946, 65 y.o.   MRN: [id number]  BP still running high.  Would add amlodipine 5 mg daily.    HPI    Review of Systems    Physical Exam

## 2011-02-17 ENCOUNTER — Other Ambulatory Visit: Payer: Self-pay | Admitting: *Deleted

## 2011-02-17 MED ORDER — NIACIN ER (ANTIHYPERLIPIDEMIC) 1000 MG PO TBCR: 1000.0000 mg | EXTENDED_RELEASE_TABLET | Freq: Every day | ORAL | Status: DC

## 2011-03-10 ENCOUNTER — Emergency Department: Payer: Medicare Other | Admitting: Emergency Medicine

## 2011-03-10 ENCOUNTER — Telehealth: Payer: Self-pay

## 2011-03-10 NOTE — Telephone Encounter (Signed)
Agree with ER evaluation

## 2011-03-10 NOTE — Telephone Encounter (Signed)
Patient feeling lightheaded, clammy, sick on stomach with a blood pressure reading this am of 186/96 and heart rate of 67.  The patient has a history of vertigo and MI 7-8 years ago.  He feels the same has he did when had the MI.  The wife wants to take him to the ER for evaluation.  Told the patient then go to ER or have EMS come.

## 2011-06-21 ENCOUNTER — Other Ambulatory Visit: Payer: Self-pay | Admitting: *Deleted

## 2011-06-21 NOTE — Telephone Encounter (Signed)
Do you prescribe this or Cardiology?

## 2011-06-22 ENCOUNTER — Telehealth: Payer: Self-pay | Admitting: *Deleted

## 2011-06-22 MED ORDER — LOVASTATIN 40 MG PO TABS: 40.0000 mg | ORAL_TABLET | Freq: Every day | ORAL | Status: DC

## 2011-06-22 NOTE — Telephone Encounter (Signed)
Patient advised.

## 2011-06-22 NOTE — Telephone Encounter (Signed)
Patient came in to the office this morning saying that he has been exposed to the flu and is now feeling bad.  He is asking advice about what to do.

## 2011-06-22 NOTE — Telephone Encounter (Signed)
He says he had a bad sore throat yesterday and today he feels bad and has a cough.

## 2011-06-22 NOTE — Telephone Encounter (Signed)
I'm okay sending this in.

## 2011-06-22 NOTE — Telephone Encounter (Signed)
If patient has a fever, then I would start tamiflu 75mg  po bid x5 days.  Please call in.  If no fever, then I wouldn't start the medicine.  If patient doesn't have a contraindication, then he should get a flu shot every fall.  That is dramatically better protection than tamiflu.

## 2011-07-11 ENCOUNTER — Encounter: Payer: Self-pay | Admitting: Internal Medicine

## 2011-07-11 ENCOUNTER — Ambulatory Visit: Payer: Medicare Other | Admitting: Family Medicine

## 2011-07-11 ENCOUNTER — Ambulatory Visit (INDEPENDENT_AMBULATORY_CARE_PROVIDER_SITE_OTHER): Payer: Medicare Other | Admitting: Internal Medicine

## 2011-07-11 VITALS — BP 140/70 | HR 58 | Temp 97.6°F | Ht 69.0 in | Wt 212.0 lb

## 2011-07-11 DIAGNOSIS — R05 Cough: Secondary | ICD-10-CM

## 2011-07-11 MED ORDER — AMOXICILLIN 500 MG PO TABS: 1000.0000 mg | ORAL_TABLET | Freq: Two times a day (BID) | ORAL | Status: AC

## 2011-07-11 MED ORDER — ALBUTEROL SULFATE HFA 108 (90 BASE) MCG/ACT IN AERS: 2.0000 | INHALATION_SPRAY | RESPIRATORY_TRACT | Status: DC | PRN

## 2011-07-11 NOTE — Progress Notes (Signed)
Subjective:    Patient ID: [name], male    DOB: September 18, 1945, 65 y.o.   MRN: [id number]  HPI Having a persistent cough--goes back 6 weeks Feels something in his throat---just gets stuck there mucinex has helped but then runs out  Doesn't feel sick No fever Feels persistent PND---feels chunks of mucus Unable to bring up mucus  Wheezes all the time--no change real change of late Uses the proair ~once a week Hasn't helped his cough One side of throat is raw every morning  Allergies are spring and fall only  Current Outpatient Prescriptions on File Prior to Visit  Medication Sig Dispense Refill  . albuterol (PROAIR HFA) 108 (90 BASE) MCG/ACT inhaler Inhale 2 puffs into the lungs every 4 (four) hours as needed.  1 Inhaler  1  . aspirin (ADULT ASPIRIN EC LOW STRENGTH) 81 MG EC tablet Take 162 mg by mouth daily.       . Fish Oil OIL Take 1 capsule by mouth 2 (two) times daily.        Marland Kitchen GLUCOSAMINE-CHONDROITIN PO Take 1 tablet by mouth 2 (two) times daily.        Marland Kitchen lisinopril (PRINIVIL,ZESTRIL) 40 MG tablet Take 1 tablet (40 mg total) by mouth daily.  30 tablet  6  . lovastatin (MEVACOR) 40 MG tablet Take 1 tablet (40 mg total) by mouth at bedtime.  180 tablet  3  . meclizine (ANTIVERT) 25 MG tablet Take 25 mg by mouth every 6 (six) hours as needed.        . metoprolol tartrate (LOPRESSOR) 25 MG tablet Take 1 tablet (25 mg total) by mouth 2 (two) times daily.  60 tablet  6  . niacin (NIASPAN) 1000 MG CR tablet Take 1 tablet (1,000 mg total) by mouth at bedtime.  30 tablet  4  . nitroGLYCERIN (NITROSTAT) 0.4 MG SL tablet Place 1 tablet (0.4 mg total) under the tongue as directed.  25 tablet  5    No Known Allergies  Past Medical History  Diagnosis Date  . CAD (coronary artery disease)     Pt had a MI in 1999 and had a stent placed in LAD. Had an infreior MI in 2005 and had a drug-eluting stent placed in his RCA. Most recent functional study was Myoview in June 2007 with an EF of  61% and no evidence for ischemia  . Hypercholesterolemia   . Osteoarthritis of hip   . Osteoarthritis, knee   . Tobacco abuse     Prior  . Impaired fasting glucose     Elevated after steroid injection  . COPD (chronic obstructive pulmonary disease)     Past Surgical History  Procedure Date  . Coronary stent placement     Multiple, LAD in 1999, RCA in 2005    Family History  Problem Relation Age of Onset  . Heart failure Father     CHF    History   Social History  . Marital Status: Married    Spouse Name: N/A    Number of Children: 2  . Years of Education: N/A   Occupational History  . 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 Smoking status: Former Smoker -- 1.0 packs/day for 40 years    Types: Cigarettes    Quit date: 10/20/2009  . Smokeless tobacco: Never Used   Comment: Past smoker, states he will smoke maybe 1 cigarette/ month or so still  . Alcohol  Use: Yes     Occasional beer on the weekends  . Drug Use: No  . Sexually Active: Not on file   Other Topics Concern  . Not on file   Social History Narrative   Lives in [place] 46 years2 grown daughters, 7 grandchildren, 1 great grandchildDesignated Party Release Form signed on 01/19/10 appointing Evelene Croon   Review of Systems Rare heartburn only occ notices throat more irritated after eating---but not a big part of the cough    Objective:   Physical Exam  Constitutional: He appears well-developed and well-nourished. No distress.  HENT:  Head: Normocephalic and atraumatic.  Right Ear: External ear normal.  Left Ear: External ear normal.  Mouth/Throat: Oropharynx is clear and moist. No oropharyngeal exudate.       No sinus tenderness Mild nasal inflammation  Neck: Normal range of motion. Neck supple.  Pulmonary/Chest: Effort normal and breath sounds normal. No respiratory distress. He has no wheezes. He has no rales.  Abdominal: Soft. There is no tenderness.    Musculoskeletal: He exhibits no edema.  Lymphadenopathy:    He has no cervical adenopathy.          Assessment & Plan:

## 2011-07-11 NOTE — Patient Instructions (Signed)
Please try over the counter cetirizine 10mg  daily as this may dry up the drainage more also

## 2011-07-11 NOTE — Assessment & Plan Note (Signed)
Ongoing cough for 6 weeks or so Spirometry just shows some decrease in FEV1--more consistent with restrictive pattern. I don't think the cough is from bronchospasm (and FEV1 well over 2l) No persistent reflux but should consider empiric Rx if symptoms persist May have low level ongoing sinusitis Will treat with amoxicillin

## 2011-08-03 DIAGNOSIS — M25539 Pain in unspecified wrist: Secondary | ICD-10-CM | POA: Diagnosis not present

## 2011-08-03 DIAGNOSIS — S5000XA Contusion of unspecified elbow, initial encounter: Secondary | ICD-10-CM | POA: Diagnosis not present

## 2011-08-03 DIAGNOSIS — M25439 Effusion, unspecified wrist: Secondary | ICD-10-CM | POA: Diagnosis not present

## 2011-08-03 DIAGNOSIS — M7020 Olecranon bursitis, unspecified elbow: Secondary | ICD-10-CM | POA: Diagnosis not present

## 2011-08-11 DIAGNOSIS — M7020 Olecranon bursitis, unspecified elbow: Secondary | ICD-10-CM | POA: Diagnosis not present

## 2011-08-11 DIAGNOSIS — M25439 Effusion, unspecified wrist: Secondary | ICD-10-CM | POA: Diagnosis not present

## 2011-08-11 DIAGNOSIS — S5000XA Contusion of unspecified elbow, initial encounter: Secondary | ICD-10-CM | POA: Diagnosis not present

## 2011-08-11 DIAGNOSIS — M25539 Pain in unspecified wrist: Secondary | ICD-10-CM | POA: Diagnosis not present

## 2011-08-14 ENCOUNTER — Other Ambulatory Visit: Payer: Self-pay | Admitting: Cardiology

## 2011-08-30 ENCOUNTER — Other Ambulatory Visit: Payer: Self-pay | Admitting: Cardiology

## 2011-08-30 ENCOUNTER — Other Ambulatory Visit: Payer: Self-pay | Admitting: *Deleted

## 2011-08-30 MED ORDER — NIACIN ER (ANTIHYPERLIPIDEMIC) 1000 MG PO TBCR: 1000.0000 mg | EXTENDED_RELEASE_TABLET | Freq: Every day | ORAL | Status: DC

## 2011-08-30 NOTE — Telephone Encounter (Signed)
Patient not seen for physical in over 1 year 

## 2011-08-30 NOTE — Telephone Encounter (Signed)
Patient advised.

## 2011-08-30 NOTE — Telephone Encounter (Signed)
Send in, schedule OV for this spring.  Thanks.

## 2011-11-07 ENCOUNTER — Encounter: Payer: Self-pay | Admitting: Cardiology

## 2011-11-07 ENCOUNTER — Ambulatory Visit (INDEPENDENT_AMBULATORY_CARE_PROVIDER_SITE_OTHER): Payer: Medicare Other | Admitting: Cardiology

## 2011-11-07 VITALS — BP 152/84 | HR 60 | Ht 68.0 in | Wt 213.8 lb

## 2011-11-07 DIAGNOSIS — I2510 Atherosclerotic heart disease of native coronary artery without angina pectoris: Secondary | ICD-10-CM | POA: Diagnosis not present

## 2011-11-07 DIAGNOSIS — I10 Essential (primary) hypertension: Secondary | ICD-10-CM | POA: Diagnosis not present

## 2011-11-07 DIAGNOSIS — E7800 Pure hypercholesterolemia, unspecified: Secondary | ICD-10-CM | POA: Diagnosis not present

## 2011-11-07 DIAGNOSIS — E785 Hyperlipidemia, unspecified: Secondary | ICD-10-CM

## 2011-11-07 NOTE — Patient Instructions (Signed)
Your physician recommends that you return for a FASTING lipid profile /liver profile in about 2 weeks. This can be scheduled in [place].   Take and record your blood pressure daily. I will call you in about 2 weeks to get the readings. Luana Shu. [phone]  Use tylenol instead of ibuprofen for pain.  Your physician wants you to follow-up in: 1 year with Dr Shirlee Latch. (April 2014). You will receive a reminder letter in the mail two months in advance. If you don't receive a letter, please call our office to schedule the follow-up appointment.

## 2011-11-08 NOTE — Assessment & Plan Note (Addendum)
No exertional symptoms.  He will continue ACEI, metoprolol, ASA, and statin.  I encouraged him to try to exercise more.  He cannot do a lot of walking with his joint pain but likes to swim.  I asked him to consider joining the [employer] to use the pool.

## 2011-11-08 NOTE — Assessment & Plan Note (Signed)
Check lipids/LFTs with goal LDL < 70.  

## 2011-11-08 NOTE — Progress Notes (Signed)
PCP: Dr. Para March  66 yo with history of CAD s/p LAD and RCA PCIs presents for followup.  He has been doing well in general.  He has not had any exertional chest or back pain (had upper back pain with prior MI).  No significant exertional shortness of breath.  He is limited by joint pain and does use Ibuprofen.  BP is running high today.    ECG: NSR, anterolateral T wave flattening  Labs (7/11): LDL 76, HDl 35, K 4.6, creatinine 1.1 Labs (6/12): LDL 59, HDL 29 Labs (7/12): K 4.2, creatinine 0.96  Allergies (verified):  No Known Drug Allergies  Past Medical History: 1. Coronary artery disease.  The patient had a myocardial infarction in 1999 and had a stent placed in his LAD.  He had an inferior MI in 2005 and had a drug-eluting stent placed in his RCA.  His most recent functional study was Myoview in June 2007 with an EF of 61% and no evidence for ischemia. 2. Hypercholesterolemia. 3. Osteoarthritis of the hip and the knee. 4. 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  He was a past smoker.  He states that he will smoke maybe 1 cigarette a month or so still.  He lives in [place].  ROS: All systems reviewed and negative except as per HPI.   Current Outpatient Prescriptions  Medication Sig Dispense Refill  . albuterol (PROAIR HFA) 108 (90 BASE) MCG/ACT inhaler Inhale 2 puffs into the lungs every 4 (four) hours as needed.  1 Inhaler  1  . aspirin (ADULT ASPIRIN EC LOW STRENGTH) 81 MG EC tablet Take 81 mg by mouth daily.       . Fish Oil OIL Take 1 capsule by mouth 2 (two) times daily.        Marland Kitchen GLUCOSAMINE-CHONDROITIN PO Take 1 tablet by mouth 2 (two) times daily.        Marland Kitchen lisinopril (PRINIVIL,ZESTRIL) 40 MG tablet TAKE ONE TABLET BY MOUTH EVERY DAY  30 tablet  5  . lovastatin (MEVACOR) 40 MG tablet Take 1 tablet (40 mg total) by mouth at bedtime.  180 tablet  3  .  meclizine (ANTIVERT) 25 MG tablet Take 25 mg by mouth every 6 (six) hours as needed.        . metoprolol tartrate (LOPRESSOR) 25 MG tablet TAKE ONE TABLET BY MOUTH TWICE DAILY  60 tablet  6  . niacin (NIASPAN) 1000 MG CR tablet Take 1 tablet (1,000 mg total) by mouth at bedtime.  30 tablet  5  . nitroGLYCERIN (NITROSTAT) 0.4 MG SL tablet Place 1 tablet (0.4 mg total) under the tongue as directed.  25 tablet  5    BP 152/84  Pulse 60  Ht 5\' 8"  (1.727 m)  Wt 213 lb 12.8 oz (96.979 kg)  BMI 32.51 kg/m2 General:  Well developed, well nourished, in no acute distress. Neck:  Neck supple, no JVD. No masses, thyromegaly or abnormal cervical nodes. Lungs:  Bilateral wheezes Heart:  Non-displaced PMI, chest non-tender; regular rate and rhythm, S1, S2 without murmurs, rubs or gallops. Carotid upstroke normal, no bruit. Pedals normal pulses. No edema, no varicosities. Abdomen:  Bowel sounds positive; abdomen soft and non-tender without masses, organomegaly, or hernias noted. No hepatosplenomegaly. Extremities:  No clubbing or cyanosis. Neurologic:  Alert and oriented x 3. Psych:  Normal affect.

## 2011-11-08 NOTE — Assessment & Plan Note (Signed)
BP high today.  I have asked him to check his BP daily at home for 2 wks (has a cuff).  We will call him to see what his readings look like to determine if he needs advancement of BP regimen.  I also asked him to try to use Tylenol instead of Ibuprofen for joint pain.

## 2011-11-21 ENCOUNTER — Ambulatory Visit (INDEPENDENT_AMBULATORY_CARE_PROVIDER_SITE_OTHER): Payer: Medicare Other

## 2011-11-21 DIAGNOSIS — E7800 Pure hypercholesterolemia, unspecified: Secondary | ICD-10-CM

## 2011-11-21 DIAGNOSIS — I2510 Atherosclerotic heart disease of native coronary artery without angina pectoris: Secondary | ICD-10-CM

## 2011-11-21 DIAGNOSIS — E785 Hyperlipidemia, unspecified: Secondary | ICD-10-CM

## 2011-11-21 DIAGNOSIS — I10 Essential (primary) hypertension: Secondary | ICD-10-CM

## 2011-11-22 ENCOUNTER — Telehealth: Payer: Self-pay | Admitting: *Deleted

## 2011-11-22 ENCOUNTER — Ambulatory Visit (INDEPENDENT_AMBULATORY_CARE_PROVIDER_SITE_OTHER): Payer: Medicare Other | Admitting: Family Medicine

## 2011-11-22 ENCOUNTER — Encounter: Payer: Self-pay | Admitting: Family Medicine

## 2011-11-22 VITALS — BP 162/80 | HR 58 | Temp 98.3°F | Wt 211.0 lb

## 2011-11-22 DIAGNOSIS — J019 Acute sinusitis, unspecified: Secondary | ICD-10-CM | POA: Insufficient documentation

## 2011-11-22 LAB — LIPID PANEL: HDL: 29 mg/dL — ABNORMAL LOW (ref >39)

## 2011-11-22 LAB — HEPATIC FUNCTION PANEL
Albumin: 4 g/dL (ref 3.6–4.8)
Alkaline Phosphatase: 52 IU/L (ref 25–160)
Bilirubin, Direct: 0.31 mg/dL (ref 0.00–0.40)
Total Bilirubin: 1.4 mg/dL — ABNORMAL HIGH (ref 0.0–1.2)
Total Protein: 6.1 g/dL (ref 6.0–8.5)

## 2011-11-22 MED ORDER — NITROGLYCERIN 0.4 MG SL SUBL: 0.4000 mg | SUBLINGUAL_TABLET | SUBLINGUAL | Status: DC

## 2011-11-22 MED ORDER — FLUTICASONE PROPIONATE 50 MCG/ACT NA SUSP: 2.0000 | Freq: Every day | NASAL | Status: DC

## 2011-11-22 MED ORDER — AMOXICILLIN-POT CLAVULANATE 875-125 MG PO TABS: 1.0000 | ORAL_TABLET | Freq: Two times a day (BID) | ORAL | Status: AC

## 2011-11-22 MED ORDER — GUAIFENESIN-CODEINE 100-10 MG/5ML PO SYRP: 5.0000 mL | ORAL_SOLUTION | Freq: Two times a day (BID) | ORAL | Status: AC | PRN

## 2011-11-22 NOTE — Patient Instructions (Signed)
You have a sinus infection. Take medicine as prescribed: augmentin twice daily for 10 days Use flonase nasal spray Use cheratussin for cough at night. Push fluids and plenty of rest. Nasal saline irrigation or neti pot to help drain sinuses. May use simple mucinex with plenty of fluid to help mobilize mucous. Let us know if fever >101.5, trouble opening/closing mouth, difficulty swallowing, or worsening - you may need to be seen again.

## 2011-11-22 NOTE — Progress Notes (Signed)
  Subjective:    Patient ID: [name], male    DOB: October 17, 1945, 66 y.o.   MRN: [id number]  HPI CC: cough, sinus congestion/drainage  3-4 wk h/o constant drainage, cough from drainage, head staying stopped up.  Cough dry.  Mainly feels irritated throat from continual drainage.  Tinnitus longstanding.  Congestion in head as well as in chest.  Blowing nose with yellow mucous.  So far has tried mucinex, decongestants, but only temporary relief.  No fevers/chills, abd pain, n/v, sore throat, HA, ear or tooth pain.  No sneezing or watery eyes.  No smokers at home.  No sick contacts at home.  Pt quit smoking 2011.  No h/o allergy problems.  + asthma and COPD hx.  BP Readings from Last 3 Encounters:  11/22/11 162/80  11/07/11 152/84  07/11/11 140/70    Review of Systems Per HPI    Objective:   Physical Exam  Nursing note and vitals reviewed. Constitutional: He appears well-developed and well-nourished. No distress.  HENT:  Head: Normocephalic and atraumatic.  Right Ear: Hearing, tympanic membrane, external ear and ear canal normal.  Left Ear: Hearing, external ear and ear canal normal.  Nose: Mucosal edema present. No rhinorrhea. Right sinus exhibits no maxillary sinus tenderness and no frontal sinus tenderness. Left sinus exhibits no maxillary sinus tenderness and no frontal sinus tenderness.  Mouth/Throat: Uvula is midline, oropharynx is clear and moist and mucous membranes are normal. No oropharyngeal exudate, posterior oropharyngeal edema, posterior oropharyngeal erythema or tonsillar abscesses.       Cerumen removed from L canal but still unable to visualize L TM. R>L nasal edema Posterior oropharyngeal cobblestoning  Eyes: Conjunctivae and EOM are normal. Pupils are equal, round, and reactive to light. No scleral icterus.  Neck: Normal range of motion. Neck supple.  Cardiovascular: Normal rate, regular rhythm, normal heart sounds and intact distal pulses.   No murmur  heard. Pulmonary/Chest: Effort normal. No respiratory distress. He has no wheezes. He has rhonchi (mild expiratory). He has no rales.  Lymphadenopathy:    He has no cervical adenopathy.  Skin: Skin is warm and dry. No rash noted.       Assessment & Plan:

## 2011-11-22 NOTE — Telephone Encounter (Signed)
HYPERTENSION, UNSPECIFIED - Marca Ancona, MD 11/08/2011 12:43 AM Signed  BP high today. I have asked him to check his BP daily at home for 2 wks (has a cuff). We will call him to see what his readings look like to determine if he needs advancement of BP regimen. I also asked him to try to use Tylenol instead of Ibuprofen for joint pain.  11/22/11--I called pt to get record of recent BP readings. LMTCB for pt.

## 2011-11-22 NOTE — Assessment & Plan Note (Signed)
Anticipate acute bacterial sinusitis with significant sinus congestion and drainage leading to cough. Some rhonchi, h/o COPD, treat with augmentin. flonase as well as cheratussin prescribed today. Supportive care as per instructions.  Cerumen partially removed with plastic curette on left side, rec dilute H2O2 to finish disimpacting at home.

## 2011-11-24 ENCOUNTER — Other Ambulatory Visit: Payer: Self-pay | Admitting: *Deleted

## 2011-11-24 DIAGNOSIS — E7800 Pure hypercholesterolemia, unspecified: Secondary | ICD-10-CM

## 2011-11-24 MED ORDER — NIACIN ER (ANTIHYPERLIPIDEMIC) 1000 MG PO TBCR: EXTENDED_RELEASE_TABLET | ORAL | Status: DC

## 2011-11-24 NOTE — Telephone Encounter (Signed)
Fu msg Pt returning your call 

## 2011-11-24 NOTE — Telephone Encounter (Signed)
LMTCB

## 2011-11-24 NOTE — Telephone Encounter (Signed)
Spoke with pt. Recent BP readings--- 11/16/11 141/80  11/17/11 176/93  11/18/11 150/81  11/19/11 146/ 84  11/20/11  151/89  11/21/11  142/83  11/22/11 155/85   11/23/11 145/81. I will forward to Dr Shirlee Latch for review.

## 2011-11-25 MED ORDER — AMLODIPINE BESYLATE 5 MG PO TABS: 5.0000 mg | ORAL_TABLET | Freq: Every day | ORAL | Status: DC

## 2011-11-25 NOTE — Telephone Encounter (Signed)
BP is high.  Add amlodipine 5 mg daily.  Have him call with BP numbers in 2 wks.

## 2011-11-25 NOTE — Telephone Encounter (Signed)
Extensive message left on mobile voicemail to start Amlodipine 5mg  daily. Keep a record of bps and call back with recordings. Also to call back if any questions.  Amlodipine e-scribed to pharmacy. Mylo Red RN

## 2011-12-20 ENCOUNTER — Telehealth: Payer: Self-pay | Admitting: Cardiology

## 2011-12-20 DIAGNOSIS — E7800 Pure hypercholesterolemia, unspecified: Secondary | ICD-10-CM

## 2011-12-20 NOTE — Telephone Encounter (Signed)
Spoke with pt. Pt is having a fair amount of nausea on Niaspan 2000mg  hs. He states he is eating a snack when he takes Niaspan.  Pt will decrease Niaspan to 1500mg  hs. He will call back if he is unable to tolerate this dose.

## 2011-12-20 NOTE — Telephone Encounter (Signed)
New msg Pt is having side effect from niaspan. Please call him back

## 2011-12-26 DIAGNOSIS — M7020 Olecranon bursitis, unspecified elbow: Secondary | ICD-10-CM | POA: Diagnosis not present

## 2011-12-26 DIAGNOSIS — I10 Essential (primary) hypertension: Secondary | ICD-10-CM | POA: Diagnosis not present

## 2011-12-26 DIAGNOSIS — M25539 Pain in unspecified wrist: Secondary | ICD-10-CM | POA: Diagnosis not present

## 2011-12-27 ENCOUNTER — Ambulatory Visit (INDEPENDENT_AMBULATORY_CARE_PROVIDER_SITE_OTHER): Payer: Medicare Other | Admitting: Family Medicine

## 2011-12-27 ENCOUNTER — Encounter: Payer: Self-pay | Admitting: Family Medicine

## 2011-12-27 VITALS — BP 106/60 | HR 64 | Temp 98.1°F | Wt 207.2 lb

## 2011-12-27 DIAGNOSIS — K611 Rectal abscess: Secondary | ICD-10-CM

## 2011-12-27 DIAGNOSIS — K612 Anorectal abscess: Secondary | ICD-10-CM | POA: Diagnosis not present

## 2011-12-27 DIAGNOSIS — I10 Essential (primary) hypertension: Secondary | ICD-10-CM

## 2011-12-27 HISTORY — DX: Rectal abscess: K61.1

## 2011-12-27 MED ORDER — AMLODIPINE BESYLATE 2.5 MG PO TABS: 2.5000 mg | ORAL_TABLET | Freq: Every day | ORAL | Status: DC

## 2011-12-27 MED ORDER — DOXYCYCLINE HYCLATE 100 MG PO CAPS: 100.0000 mg | ORAL_CAPSULE | Freq: Two times a day (BID) | ORAL | Status: DC

## 2011-12-27 NOTE — Patient Instructions (Addendum)
Start taking lower dose of amlodipine (2.5mg  daily).  I've sent this lower dose in to see if any improvement in symptoms. Start doxycycline twice daily for 10 days.  Soak in warm tub, warm compresses to area. Pass by [facility]'s office for referral to surgeon to eval perirectal cellulitis. If area coming to a head, or worsening pain or spreading redness, please return to see [place]. Good to see you today, call us with quesitons.

## 2011-12-27 NOTE — Assessment & Plan Note (Signed)
bp low, on recheck improved.  However given sxs endorsed, recommend decrease amlodipine to 2.5mg  daily.  Update [place] if not improved with this.

## 2011-12-27 NOTE — Progress Notes (Signed)
  Subjective:    Patient ID: [name], male    DOB: 1945/12/08, 66 y.o.   MRN: [id number]  HPI CC: not feeling well.  Recently seen by Cards Shirlee Latch) and started on amlodipine 5mg  (11/2011) for elevated blood pressures.  Since then, feeling ill - nauseated, fatigued, no energy, no appetite.  BP low today.  Denies chest pain/tightness, sob, dizziness, HA.  Keeps cough.  Also now with swelling rectal area, has doubled in size since yesterday.  Wonders if has hemorrhoid.  Denies constipation.  Some diarrhea last few days.  Denies blood in stool.  Fever to 101.9 last night as well as chills.  Voiding fine.  H/o hemorrhoids in past, but nothing like this in past.per  Had right olecranon bursa drained yesterday, injected with steroids.  Not warm or streaking redness. H/o scrotal abscess 2007 needing I&D in OR by urology. H/o smoking, quit 1999.  Wt Readings from Last 3 Encounters:  12/27/11 207 lb 4 oz (94.008 kg)  11/22/11 211 lb (95.709 kg)  11/07/11 213 lb 12.8 oz (96.979 kg)    BP Readings from Last 3 Encounters:  12/27/11 98/58  11/22/11 162/80  11/07/11 152/84    Past Medical History  Diagnosis Date  . CAD (coronary artery disease)     Pt had a MI in 1999 and had a stent placed in LAD. Had an infreior MI in 2005 and had a drug-eluting stent placed in his RCA. Most recent functional study was Myoview in June 2007 with an EF of 61% and no evidence for ischemia  . Hypercholesterolemia   . Osteoarthritis of hip   . Osteoarthritis, knee   . Tobacco abuse     Prior  . Impaired fasting glucose     Elevated after steroid injection  . COPD (chronic obstructive pulmonary disease)   . HTN (hypertension)      Review of Systems Per HPI    Objective:   Physical Exam  Nursing note and vitals reviewed. Constitutional: He appears well-developed and well-nourished. No distress.  HENT:  Head: Normocephalic and atraumatic.  Mouth/Throat: Oropharynx is clear and moist. No  oropharyngeal exudate.  Cardiovascular: Normal rate, regular rhythm, normal heart sounds and intact distal pulses.   No murmur heard. Pulmonary/Chest: Effort normal. No respiratory distress. He has wheezes (mild exp wheezing). He has no rales.       Upper airway wheezing present  Genitourinary: Rectal exam shows tenderness. Rectal exam shows no internal hemorrhoid and no fissure.          Left perirectal (about 2cm distal to anus) induration and erythema, warmth.  No fluctuance.  Skin: Skin is warm and dry. No rash noted. There is erythema.       Assessment & Plan:

## 2011-12-27 NOTE — Assessment & Plan Note (Signed)
No fluctuance, did not see obvious area that needed drainage, however given systemic sxs (fever last night) and location, I do want surgery input and assistance to follow infection. Will refer, see if he can be seen at [facility] urgent [facility].  Appreciate their care. Start doxy, warm compresses.  Update [place] if not better.

## 2011-12-28 ENCOUNTER — Ambulatory Visit (INDEPENDENT_AMBULATORY_CARE_PROVIDER_SITE_OTHER): Payer: Medicare Other | Admitting: General Surgery

## 2011-12-28 ENCOUNTER — Encounter (HOSPITAL_COMMUNITY)
Admission: AD | Disposition: A | Discharge status: Home / Self Care | Payer: Self-pay | Source: Ambulatory Visit | Attending: General Surgery

## 2011-12-28 ENCOUNTER — Inpatient Hospital Stay (HOSPITAL_COMMUNITY)
Admission: AD | Admit: 2011-12-28 | Discharge: 2011-12-30 | DRG: 349 | Disposition: A | Discharge status: Home / Self Care | Payer: Medicare Other | Source: Ambulatory Visit | Attending: General Surgery | Admitting: General Surgery

## 2011-12-28 ENCOUNTER — Encounter (INDEPENDENT_AMBULATORY_CARE_PROVIDER_SITE_OTHER): Payer: Self-pay | Admitting: General Surgery

## 2011-12-28 ENCOUNTER — Encounter (HOSPITAL_COMMUNITY): Payer: Self-pay | Admitting: General Practice

## 2011-12-28 ENCOUNTER — Inpatient Hospital Stay (HOSPITAL_COMMUNITY): Payer: Medicare Other | Admitting: Anesthesiology

## 2011-12-28 ENCOUNTER — Encounter (HOSPITAL_COMMUNITY): Payer: Self-pay | Admitting: Anesthesiology

## 2011-12-28 VITALS — BP 112/68 | HR 88 | Temp 98.8°F | Resp 20 | Ht 68.0 in | Wt 206.0 lb

## 2011-12-28 DIAGNOSIS — I2510 Atherosclerotic heart disease of native coronary artery without angina pectoris: Secondary | ICD-10-CM | POA: Diagnosis present

## 2011-12-28 DIAGNOSIS — Z23 Encounter for immunization: Secondary | ICD-10-CM | POA: Diagnosis not present

## 2011-12-28 DIAGNOSIS — T465X5A Adverse effect of other antihypertensive drugs, initial encounter: Secondary | ICD-10-CM | POA: Diagnosis not present

## 2011-12-28 DIAGNOSIS — I9589 Other hypotension: Secondary | ICD-10-CM | POA: Diagnosis not present

## 2011-12-28 DIAGNOSIS — J4489 Other specified chronic obstructive pulmonary disease: Secondary | ICD-10-CM | POA: Diagnosis not present

## 2011-12-28 DIAGNOSIS — K612 Anorectal abscess: Secondary | ICD-10-CM

## 2011-12-28 DIAGNOSIS — J449 Chronic obstructive pulmonary disease, unspecified: Secondary | ICD-10-CM | POA: Diagnosis present

## 2011-12-28 DIAGNOSIS — E782 Mixed hyperlipidemia: Secondary | ICD-10-CM | POA: Diagnosis not present

## 2011-12-28 DIAGNOSIS — I10 Essential (primary) hypertension: Secondary | ICD-10-CM | POA: Diagnosis not present

## 2011-12-28 DIAGNOSIS — R509 Fever, unspecified: Secondary | ICD-10-CM | POA: Diagnosis not present

## 2011-12-28 DIAGNOSIS — K611 Rectal abscess: Secondary | ICD-10-CM

## 2011-12-28 DIAGNOSIS — Y9210 Unspecified residential institution as the place of occurrence of the external cause: Secondary | ICD-10-CM | POA: Diagnosis not present

## 2011-12-28 HISTORY — PX: INCISION AND DRAINAGE PERIRECTAL ABSCESS: SHX1804

## 2011-12-28 HISTORY — DX: Personal history of other diseases of the digestive system: Z87.19

## 2011-12-28 HISTORY — DX: Rectal abscess: K61.1

## 2011-12-28 LAB — BASIC METABOLIC PANEL
BUN: 15 mg/dL (ref 6–23)
Calcium: 9.7 mg/dL (ref 8.4–10.5)
Chloride: 99 mEq/L (ref 96–112)
Creatinine, Ser: 1.09 mg/dL (ref 0.50–1.35)
GFR calc Af Amer: 80 mL/min — ABNORMAL LOW (ref >90)

## 2011-12-28 LAB — DIFFERENTIAL
Basophils Absolute: 0 10*3/uL (ref 0.0–0.1)
Basophils Relative: 0 % (ref 0–1)
Eosinophils Relative: 0 % (ref 0–5)
Lymphocytes Relative: 5 % — ABNORMAL LOW (ref 12–46)
Monocytes Absolute: 1.5 10*3/uL — ABNORMAL HIGH (ref 0.1–1.0)
Neutro Abs: 17.2 10*3/uL — ABNORMAL HIGH (ref 1.7–7.7)

## 2011-12-28 LAB — PROTIME-INR: Prothrombin Time: 14.1 seconds (ref 11.6–15.2)

## 2011-12-28 LAB — CBC
HCT: 46.3 % (ref 39.0–52.0)
MCHC: 35.9 g/dL (ref 30.0–36.0)
MCV: 91.1 fL (ref 78.0–100.0)
Platelets: 146 10*3/uL — ABNORMAL LOW (ref 150–400)
RDW: 13.6 % (ref 11.5–15.5)
WBC: 19.7 10*3/uL — ABNORMAL HIGH (ref 4.0–10.5)

## 2011-12-28 SURGERY — INCISION AND DRAINAGE, ABSCESS, PERIRECTAL
Anesthesia: General | Site: Perineum | Wound class: Contaminated

## 2011-12-28 MED ORDER — MIDAZOLAM HCL 5 MG/5ML IJ SOLN
INTRAMUSCULAR | Status: DC | PRN
  Administered 2011-12-28: 2 mg via INTRAVENOUS

## 2011-12-28 MED ORDER — ONDANSETRON HCL 4 MG/2ML IJ SOLN: 4.0000 mg | Freq: Four times a day (QID) | INTRAMUSCULAR | Status: DC | PRN

## 2011-12-28 MED ORDER — ASPIRIN EC 81 MG PO TBEC
81.0000 mg | DELAYED_RELEASE_TABLET | Freq: Two times a day (BID) | ORAL | Status: DC
  Administered 2011-12-29: 81 mg via ORAL
  Filled 2011-12-28 (×3): qty 1

## 2011-12-28 MED ORDER — KCL IN DEXTROSE-NACL 20-5-0.9 MEQ/L-%-% IV SOLN
INTRAVENOUS | Status: DC
  Administered 2011-12-28 (×2): via INTRAVENOUS
  Filled 2011-12-28 (×4): qty 1000

## 2011-12-28 MED ORDER — ONDANSETRON HCL 4 MG/2ML IJ SOLN: 4.0000 mg | Freq: Once | INTRAMUSCULAR | Status: DC | PRN

## 2011-12-28 MED ORDER — LACTATED RINGERS IV SOLN
INTRAVENOUS | Status: DC | PRN
  Administered 2011-12-28: 22:00:00 via INTRAVENOUS

## 2011-12-28 MED ORDER — CIPROFLOXACIN IN D5W 400 MG/200ML IV SOLN
400.0000 mg | Freq: Two times a day (BID) | INTRAVENOUS | Status: DC
  Administered 2011-12-29 (×2): 400 mg via INTRAVENOUS
  Filled 2011-12-28 (×4): qty 200

## 2011-12-28 MED ORDER — 0.9 % SODIUM CHLORIDE (POUR BTL) OPTIME
TOPICAL | Status: DC | PRN
  Administered 2011-12-28: 1000 mL

## 2011-12-28 MED ORDER — METOPROLOL TARTRATE 25 MG PO TABS
25.0000 mg | ORAL_TABLET | Freq: Two times a day (BID) | ORAL | Status: DC
  Administered 2011-12-29: 25 mg via ORAL
  Filled 2011-12-28 (×3): qty 1

## 2011-12-28 MED ORDER — PROPOFOL 10 MG/ML IV BOLUS
INTRAVENOUS | Status: DC | PRN
  Administered 2011-12-28: 100 mg via INTRAVENOUS

## 2011-12-28 MED ORDER — ALBUTEROL SULFATE HFA 108 (90 BASE) MCG/ACT IN AERS
INHALATION_SPRAY | RESPIRATORY_TRACT | Status: DC | PRN
  Administered 2011-12-28: 4 via RESPIRATORY_TRACT

## 2011-12-28 MED ORDER — MORPHINE SULFATE 4 MG/ML IJ SOLN
4.0000 mg | INTRAMUSCULAR | Status: DC | PRN
  Administered 2011-12-28: 4 mg via INTRAVENOUS

## 2011-12-28 MED ORDER — AMLODIPINE BESYLATE 2.5 MG PO TABS
2.5000 mg | ORAL_TABLET | Freq: Every day | ORAL | Status: DC
  Administered 2011-12-29: 2.5 mg via ORAL
  Filled 2011-12-28: qty 1

## 2011-12-28 MED ORDER — SIMVASTATIN 10 MG PO TABS
10.0000 mg | ORAL_TABLET | Freq: Every day | ORAL | Status: DC
  Administered 2011-12-29: 10 mg via ORAL
  Filled 2011-12-28 (×2): qty 1

## 2011-12-28 MED ORDER — FENTANYL CITRATE 0.05 MG/ML IJ SOLN
INTRAMUSCULAR | Status: DC | PRN
  Administered 2011-12-28: 100 ug via INTRAVENOUS

## 2011-12-28 MED ORDER — MECLIZINE HCL 25 MG PO TABS
25.0000 mg | ORAL_TABLET | Freq: Four times a day (QID) | ORAL | Status: DC | PRN
  Filled 2011-12-28: qty 1

## 2011-12-28 MED ORDER — BUPIVACAINE HCL (PF) 0.25 % IJ SOLN
INTRAMUSCULAR | Status: DC | PRN
  Administered 2011-12-28: 20 mL

## 2011-12-28 MED ORDER — PNEUMOCOCCAL VAC POLYVALENT 25 MCG/0.5ML IJ INJ
0.5000 mL | INJECTION | Freq: Once | INTRAMUSCULAR | Status: AC
  Administered 2011-12-29: 0.5 mL via INTRAMUSCULAR
  Filled 2011-12-28: qty 0.5

## 2011-12-28 MED ORDER — PANTOPRAZOLE SODIUM 40 MG IV SOLR
40.0000 mg | Freq: Every day | INTRAVENOUS | Status: DC
  Filled 2011-12-28 (×2): qty 40

## 2011-12-28 MED ORDER — LISINOPRIL 40 MG PO TABS
40.0000 mg | ORAL_TABLET | Freq: Every day | ORAL | Status: DC
  Administered 2011-12-29 – 2011-12-30 (×2): 40 mg via ORAL
  Filled 2011-12-28 (×2): qty 1

## 2011-12-28 MED ORDER — HYDROMORPHONE HCL PF 1 MG/ML IJ SOLN: 0.2500 mg | INTRAMUSCULAR | Status: DC | PRN

## 2011-12-28 MED ORDER — KCL IN DEXTROSE-NACL 20-5-0.9 MEQ/L-%-% IV SOLN
INTRAVENOUS | Status: DC
  Filled 2011-12-28 (×2): qty 1000

## 2011-12-28 MED ORDER — CIPROFLOXACIN IN D5W 400 MG/200ML IV SOLN
INTRAVENOUS | Status: AC
  Filled 2011-12-28: qty 200

## 2011-12-28 MED ORDER — NIACIN ER (ANTIHYPERLIPIDEMIC) 500 MG PO TBCR
1000.0000 mg | EXTENDED_RELEASE_TABLET | Freq: Every day | ORAL | Status: DC
  Administered 2011-12-29: 1000 mg via ORAL
  Filled 2011-12-28 (×2): qty 2

## 2011-12-28 MED ORDER — LIDOCAINE HCL (CARDIAC) 20 MG/ML IV SOLN
INTRAVENOUS | Status: DC | PRN
  Administered 2011-12-28: 80 mg via INTRAVENOUS

## 2011-12-28 MED ORDER — BUPIVACAINE-EPINEPHRINE PF 0.25-1:200000 % IJ SOLN
INTRAMUSCULAR | Status: AC
  Filled 2011-12-28: qty 30

## 2011-12-28 MED ORDER — ONDANSETRON HCL 4 MG PO TABS: 4.0000 mg | ORAL_TABLET | Freq: Four times a day (QID) | ORAL | Status: DC | PRN

## 2011-12-28 MED ORDER — CIPROFLOXACIN IN D5W 400 MG/200ML IV SOLN
INTRAVENOUS | Status: DC | PRN
  Administered 2011-12-28: 400 mg via INTRAVENOUS

## 2011-12-28 MED ORDER — MORPHINE SULFATE 4 MG/ML IJ SOLN
4.0000 mg | INTRAMUSCULAR | Status: DC | PRN
  Administered 2011-12-28 (×2): 4 mg via INTRAVENOUS
  Filled 2011-12-28 (×3): qty 1

## 2011-12-28 MED ORDER — ONDANSETRON HCL 4 MG/2ML IJ SOLN
4.0000 mg | Freq: Four times a day (QID) | INTRAMUSCULAR | Status: DC | PRN
  Administered 2011-12-28: 4 mg via INTRAVENOUS
  Filled 2011-12-28: qty 2

## 2011-12-28 MED ORDER — ALBUTEROL SULFATE HFA 108 (90 BASE) MCG/ACT IN AERS
2.0000 | INHALATION_SPRAY | RESPIRATORY_TRACT | Status: DC | PRN
  Administered 2011-12-29: 2 via RESPIRATORY_TRACT
  Filled 2011-12-28: qty 6.7

## 2011-12-28 MED ORDER — NITROGLYCERIN 0.4 MG SL SUBL: 0.4000 mg | SUBLINGUAL_TABLET | SUBLINGUAL | Status: DC | PRN

## 2011-12-28 MED ORDER — FLUTICASONE PROPIONATE 50 MCG/ACT NA SUSP
2.0000 | Freq: Every day | NASAL | Status: DC
  Administered 2011-12-29 – 2011-12-30 (×2): 2 via NASAL
  Filled 2011-12-28: qty 16

## 2011-12-28 MED ORDER — HYDROCODONE-ACETAMINOPHEN 5-325 MG PO TABS
1.0000 | ORAL_TABLET | ORAL | Status: DC | PRN
  Administered 2011-12-29: 2 via ORAL
  Administered 2011-12-29: 1 via ORAL
  Administered 2011-12-30: 2 via ORAL
  Filled 2011-12-28: qty 1
  Filled 2011-12-28 (×3): qty 2

## 2011-12-28 SURGICAL SUPPLY — 33 items
BLADE SURG 15 STRL LF DISP TIS (BLADE) ×1 IMPLANT
BLADE SURG 15 STRL SS (BLADE) ×2
CANISTER SUCTION 2500CC (MISCELLANEOUS) ×2 IMPLANT
CLEANER TIP ELECTROSURG 2X2 (MISCELLANEOUS) IMPLANT
CLOTH BEACON ORANGE TIMEOUT ST (SAFETY) ×2 IMPLANT
COVER SURGICAL LIGHT HANDLE (MISCELLANEOUS) ×2 IMPLANT
DRAPE UTILITY 15X26 W/TAPE STR (DRAPE) ×4 IMPLANT
DRSG PAD ABDOMINAL 8X10 ST (GAUZE/BANDAGES/DRESSINGS) ×2 IMPLANT
ELECT REM PT RETURN 9FT ADLT (ELECTROSURGICAL) ×2
ELECTRODE REM PT RTRN 9FT ADLT (ELECTROSURGICAL) IMPLANT
GAUZE PACKING IODOFORM 1 (PACKING) IMPLANT
GAUZE SPONGE 4X4 16PLY XRAY LF (GAUZE/BANDAGES/DRESSINGS) ×2 IMPLANT
GLOVE BIO SURGEON STRL SZ7.5 (GLOVE) ×2 IMPLANT
GOWN STRL NON-REIN LRG LVL3 (GOWN DISPOSABLE) ×4 IMPLANT
KIT BASIN OR (CUSTOM PROCEDURE TRAY) ×2 IMPLANT
KIT ROOM TURNOVER OR (KITS) ×2 IMPLANT
NEEDLE 22X1 1/2 (OR ONLY) (NEEDLE) ×1 IMPLANT
NS IRRIG 1000ML POUR BTL (IV SOLUTION) ×2 IMPLANT
PACK LITHOTOMY IV (CUSTOM PROCEDURE TRAY) ×2 IMPLANT
PAD ARMBOARD 7.5X6 YLW CONV (MISCELLANEOUS) ×4 IMPLANT
PENCIL BUTTON HOLSTER BLD 10FT (ELECTRODE) ×1 IMPLANT
SPONGE GAUZE 4X4 12PLY (GAUZE/BANDAGES/DRESSINGS) ×2 IMPLANT
SWAB COLLECTION DEVICE MRSA (MISCELLANEOUS) ×2 IMPLANT
SYR BULB 3OZ (MISCELLANEOUS) ×1 IMPLANT
SYR CONTROL 10ML LL (SYRINGE) ×1 IMPLANT
TAPE CLOTH SURG 4X10 WHT LF (GAUZE/BANDAGES/DRESSINGS) ×1 IMPLANT
TOWEL OR 17X24 6PK STRL BLUE (TOWEL DISPOSABLE) ×2 IMPLANT
TOWEL OR 17X26 10 PK STRL BLUE (TOWEL DISPOSABLE) ×2 IMPLANT
TUBE ANAEROBIC SPECIMEN COL (MISCELLANEOUS) ×2 IMPLANT
TUBE CONNECTING 12X1/4 (SUCTIONS) ×2 IMPLANT
UNDERPAD 30X30 INCONTINENT (UNDERPADS AND DIAPERS) ×2 IMPLANT
WATER STERILE IRR 1000ML POUR (IV SOLUTION) ×1 IMPLANT
YANKAUER SUCT BULB TIP NO VENT (SUCTIONS) ×2 IMPLANT

## 2011-12-28 NOTE — Transfer of Care (Signed)
Immediate Anesthesia Transfer of Care Note  Patient: [name]  Procedure(s) Performed: Procedure(s) (LRB): IRRIGATION AND DEBRIDEMENT PERIRECTAL ABSCESS (N/A)  Patient Location: PACU  Anesthesia Type: General  Level of Consciousness: awake, alert  and oriented  Airway & Oxygen Therapy: Patient connected to face mask oxygen  Post-op Assessment: Report given to PACU RN, Post -op Vital signs reviewed and stable and Patient moving all extremities X 4  Post vital signs: Reviewed and stable  Complications: No apparent anesthesia complications

## 2011-12-28 NOTE — Anesthesia Procedure Notes (Signed)
Procedure Name: LMA Insertion Date/Time: 12/28/2011 9:53 PM Performed by: Molli Hazard Pre-anesthesia Checklist: Patient identified, Emergency Drugs available, Suction available and Patient being monitored Patient Re-evaluated:Patient Re-evaluated prior to inductionOxygen Delivery Method: Circle system utilized Preoxygenation: Pre-oxygenation with 100% oxygen Intubation Type: IV induction LMA Size: 5.0 Number of attempts: 1 Dental Injury: Teeth and Oropharynx as per pre-operative assessment

## 2011-12-28 NOTE — Anesthesia Preprocedure Evaluation (Addendum)
Anesthesia Evaluation  Patient identified by MRN, date of birth, ID band Patient awake    Reviewed: Allergy & Precautions, H&P , NPO status , Patient's Chart, lab work & pertinent test results, reviewed documented beta blocker date and time   Airway Mallampati: I TM Distance: >3 FB Neck ROM: Full    Dental  (+) Dental Advisory Given and Teeth Intact   Pulmonary  breath sounds clear to auscultation        Cardiovascular Rhythm:Regular Rate:Normal     Neuro/Psych    GI/Hepatic   Endo/Other    Renal/GU      Musculoskeletal   Abdominal   Peds  Hematology   Anesthesia Other Findings   Reproductive/Obstetrics                           Anesthesia Physical Anesthesia Plan  ASA: III and Emergent  Anesthesia Plan: General   Post-op Pain Management:    Induction: Intravenous  Airway Management Planned: LMA  Additional Equipment:   Intra-op Plan:   Post-operative Plan: Extubation in OR  Informed Consent:   Dental advisory given  Plan Discussed with: Anesthesiologist and Surgeon  Anesthesia Plan Comments:         Anesthesia Quick Evaluation

## 2011-12-28 NOTE — Anesthesia Postprocedure Evaluation (Signed)
  Anesthesia Post-op Note  Patient: [name]  Procedure(s) Performed: Procedure(s) (LRB): IRRIGATION AND DEBRIDEMENT PERIRECTAL ABSCESS (N/A)  Patient Location: PACU  Anesthesia Type: General  Level of Consciousness: awake, alert  and oriented  Airway and Oxygen Therapy: Patient Spontanous Breathing  Post-op Pain: mild  Post-op Assessment: Post-op Vital signs reviewed  Post-op Vital Signs: Reviewed  Complications: No apparent anesthesia complications

## 2011-12-28 NOTE — Op Note (Signed)
12/28/2011  10:27 PM  PATIENT:  [name]  66 y.o. male  PRE-OPERATIVE DIAGNOSIS:  Peri Rectal Abscess  POST-OPERATIVE DIAGNOSIS:  * No post-op diagnosis entered *  PROCEDURE:  Procedure(s) (LRB): IRRIGATION AND DEBRIDEMENT PERIRECTAL ABSCESS (N/A)  SURGEON:  Surgeon(s) and Role:    * Robyne Askew, MD - Primary  PHYSICIAN ASSISTANT:   ASSISTANTS: none   ANESTHESIA:   general  EBL:  Total I/O In: 600 [I.V.:600] Out: 100 [Urine:100]  BLOOD ADMINISTERED:none  DRAINS: none   LOCAL MEDICATIONS USED:  MARCAINE     SPECIMEN:  No Specimen  DISPOSITION OF SPECIMEN:  N/A  COUNTS:  YES  TOURNIQUET:  * No tourniquets in log *  DICTATION: .Dragon Dictation After informed consent was obtained the patient was brought to the operating room placed in the supine position on the operating room table. After adequate induction of general anesthesia the patient was moved in the lithotomy position. His perirectal area was then prepped with Betadine and draped in usual sterile manner. A small opening in the left perirectal space was probed bluntly with the hemostat until a abscess cavity was identified. The abscess cavity was then opened sharply with the electrocautery. Cultures were obtained. The cavity was then probed bluntly with a finger and all loculations were broken up. The cavity went deep to the left perirectal space but did not appear to communicate with the inside of the rectum. Hemostasis was achieved using the Bovie electrocautery. The wound was then packed with Kerlix gauze and sterile dressings were applied. The patient tolerated the procedure well. At the end of the case on needle sponge and instrument counts were correct. The patient was then awakened and taken to recovery in stable condition.  PLAN OF CARE: Admit to inpatient   PATIENT DISPOSITION:  PACU - hemodynamically stable.   Delay start of Pharmacological VTE agent (>24hrs) due to surgical blood loss or risk of  bleeding: yes

## 2011-12-28 NOTE — H&P (Signed)
[name]  Description:  66 year old male  12/28/2011 3:15 PM Office Visit Provider:  Robyne Askew, MD  MRN: [id number] Department:  [facility]            Diagnoses  Reason for Visit    Perirectal cellulitis - Primary  Abscess   566  peri-rectal           Vitals - Last Recorded       BP  Pulse  Temp  Resp  Ht  Wt    112/68  88  98.8 F (37.1 C) (Temporal)  20  5\' 8"  (1.727 m)  206 lb (93.441 kg)          BMI               31.32 kg/m2                   Progress Notes     Robyne Askew, MD 12/28/2011 3:29 PM Signed    Subjective:    Patient ID: [name], male DOB: [birth year], 66 y.o. MRN: [id number]  HPI  We're asked to see the patient in consultation by Dr. Sharen Hones to evaluate him for a perirectal abscess. The patient is a 66 her white male who started developing some swelling on the left side of his perirectal area on Saturday. Since that time it has become significantly more swollen and tender. He has been running fevers to 101.9. He has been nauseated. He has not eaten since yesterday.  Review of Systems  Constitutional: Negative.  HENT: Negative.  Eyes: Negative.  Respiratory: Negative.  Cardiovascular: Negative.  Gastrointestinal: Positive for nausea and rectal pain.  Genitourinary: Negative.  Musculoskeletal: Negative.  Skin: Negative.  Neurological: Negative.  Hematological: Negative.  Psychiatric/Behavioral: Negative.      Objective:    Physical Exam  Constitutional: He is oriented to person, place, and time. He appears well-developed and well-nourished.  HENT:  Head: Normocephalic and atraumatic.  Eyes: Conjunctivae and EOM are normal. Pupils are equal, round, and reactive to light.  Neck: Normal range of motion. Neck supple.  Cardiovascular: Normal rate and regular rhythm.  Pulmonary/Chest: Effort normal and breath sounds normal.  Abdominal: Soft. Bowel sounds are normal.  Genitourinary:  The  patient has a large area of redness induration and swelling in the left perirectal space.  Musculoskeletal: Normal range of motion.  Neurological: He is alert and oriented to person, place, and time.  Skin: Skin is warm and dry.  Psychiatric: He has a normal mood and affect. His behavior is normal.      Assessment:     The patient has a large perirectal abscess that is too tender and too large to drain in the clinic. I think this would be best drained in the operating room. I've discussed this with him including the risks and benefits of the surgery as well as some technical aspects and he understands and wishes to proceed     Plan:     We will plan to admit him to the hospital tonight. We'll start him on broad-spectrum antibiotics. We will plan to taken to the operating room tonight for incision and drainage of the area.              Not recorded  Immunization Questions 12/28/2011             Level of Service     PR OFFICE CONSULTATION NEW/ESTAB PATIENT 40 MIN [[address]]           All Flowsheet Templates (all recorded)     Encounter Vitals Flowsheet   Custom Formula Data Flowsheet   Anthropometrics Flowsheet                           Referring Provider          Eustaquio Boyden, MD            All Charges for This Encounter       Code  Description  Service Date  Service Provider  Modifiers  Quantity    [phone]  PR OFFICE CONSULTATION NEW/ESTAB PATIENT 40 MIN  12/28/2011  Robyne Askew, MD   1    [id number]  PR CURRENT TOBACCO NON-USER  12/28/2011  Robyne Askew, MD   1                Other Encounter Related Information     Allergies & Medications      Problem List      History      Patient-Entered Questionnaires      Printed AVS Reports     No AVS reports have been printed for this encounter.          No data filed

## 2011-12-28 NOTE — Progress Notes (Signed)
Subjective:     Patient ID: [name], male   DOB: [birth year], 66 y.o.   MRN: [id number]  HPI We're asked to see the patient in consultation by Dr. Sharen Hones to evaluate him for a perirectal abscess. The patient is a 66 her white male who started developing some swelling on the left side of his perirectal area on Saturday. Since that time it has become significantly more swollen and tender. He has been running fevers to 101.9. He has been nauseated. He has not eaten since yesterday.  Review of Systems  Constitutional: Negative.   HENT: Negative.   Eyes: Negative.   Respiratory: Negative.   Cardiovascular: Negative.   Gastrointestinal: Positive for nausea and rectal pain.  Genitourinary: Negative.   Musculoskeletal: Negative.   Skin: Negative.   Neurological: Negative.   Hematological: Negative.   Psychiatric/Behavioral: Negative.        Objective:   Physical Exam  Constitutional: He is oriented to person, place, and time. He appears well-developed and well-nourished.  HENT:  Head: Normocephalic and atraumatic.  Eyes: Conjunctivae and EOM are normal. Pupils are equal, round, and reactive to light.  Neck: Normal range of motion. Neck supple.  Cardiovascular: Normal rate and regular rhythm.   Pulmonary/Chest: Effort normal and breath sounds normal.  Abdominal: Soft. Bowel sounds are normal.  Genitourinary:       The patient has a large area of redness induration and swelling in the left perirectal space.  Musculoskeletal: Normal range of motion.  Neurological: He is alert and oriented to person, place, and time.  Skin: Skin is warm and dry.  Psychiatric: He has a normal mood and affect. His behavior is normal.       Assessment:     The patient has a large perirectal abscess that is too tender and too large to drain in the clinic. I think this would be best drained in the operating room. I've discussed this with him including the risks and benefits of the surgery as well  as some technical aspects and he understands and wishes to proceed    Plan:     We will plan to admit him to the hospital tonight. We'll start him on broad-spectrum antibiotics. We will plan to taken to the operating room tonight for incision and drainage of the area.

## 2011-12-29 MED ORDER — SODIUM CHLORIDE 0.9 % IV BOLUS (SEPSIS): 500.0000 mL | Freq: Once | INTRAVENOUS | Status: DC

## 2011-12-29 MED ORDER — BIOTENE DRY MOUTH MT LIQD
15.0000 mL | Freq: Two times a day (BID) | OROMUCOSAL | Status: DC
  Administered 2011-12-29: 15 mL via OROMUCOSAL

## 2011-12-29 MED ORDER — SODIUM CHLORIDE 0.9 % IV BOLUS (SEPSIS)
500.0000 mL | Freq: Once | INTRAVENOUS | Status: AC
  Administered 2011-12-29: 500 mL via INTRAVENOUS

## 2011-12-29 MED ORDER — METOPROLOL TARTRATE 25 MG PO TABS
25.0000 mg | ORAL_TABLET | Freq: Two times a day (BID) | ORAL | Status: DC
  Administered 2011-12-29 – 2011-12-30 (×2): 25 mg via ORAL
  Filled 2011-12-29 (×3): qty 1

## 2011-12-29 MED ORDER — CHLORHEXIDINE GLUCONATE 0.12 % MT SOLN
15.0000 mL | Freq: Two times a day (BID) | OROMUCOSAL | Status: DC
  Administered 2011-12-29 (×2): 15 mL via OROMUCOSAL
  Filled 2011-12-29 (×3): qty 15

## 2011-12-29 MED ORDER — PANTOPRAZOLE SODIUM 40 MG PO TBEC
40.0000 mg | DELAYED_RELEASE_TABLET | Freq: Every day | ORAL | Status: DC
  Administered 2011-12-29 – 2011-12-30 (×2): 40 mg via ORAL
  Filled 2011-12-29 (×2): qty 1

## 2011-12-29 MED ORDER — ASPIRIN EC 81 MG PO TBEC
81.0000 mg | DELAYED_RELEASE_TABLET | Freq: Two times a day (BID) | ORAL | Status: DC
  Administered 2011-12-29 – 2011-12-30 (×3): 81 mg via ORAL
  Filled 2011-12-29 (×4): qty 1

## 2011-12-29 MED ORDER — ACETAMINOPHEN 325 MG PO TABS
650.0000 mg | ORAL_TABLET | Freq: Four times a day (QID) | ORAL | Status: DC | PRN
  Administered 2011-12-29: 650 mg via ORAL
  Filled 2011-12-29: qty 2

## 2011-12-29 MED ORDER — PHENOL 1.4 % MT LIQD
1.0000 | OROMUCOSAL | Status: DC | PRN
  Filled 2011-12-29: qty 177

## 2011-12-29 MED FILL — Dextrose 5% w/ Sodium Chloride 0.45%: INTRAVENOUS | Qty: 1000 | Status: AC

## 2011-12-29 NOTE — Progress Notes (Signed)
1 Day Post-Op  Subjective: Doing well this am, mild amount of discomfort from surgical incision. Tolerated liquid diet this am, would like "something more substantial to eat." No report of NV. +Flatus,BS.  Objective: Vital signs in last 24 hours: Temp:  [97.1 F (36.2 C)-100.5 F (38.1 C)] 98.7 F (37.1 C) (06/20 0958) Pulse Rate:  [75-99] 75  (06/20 0958) Resp:  [18-23] 18  (06/20 0958) BP: (94-142)/(49-86) 115/70 mmHg (06/20 0958) SpO2:  [92 %-100 %] 96 % (06/20 0958) Weight:  [205 lb (92.987 kg)-206 lb (93.441 kg)] 205 lb (92.987 kg) (06/19 2105) Last BM Date: 12/28/11  Intake/Output from previous day: 06/19 0701 - 06/20 0700 In: 1095.9 [I.V.:1095.9] Out: 425 [Urine:425] Intake/Output this shift: Total I/O In: 240 [P.O.:240] Out: -   General appearance: alert, cooperative, appears stated age and no distress Dressing to rectal area is C/D/I minimal amount of serous appearing drainage on dressing, packing remains in place. Some erythema noted. He remains afebrile, BP is well controlled.  Lab Results:   [facility] 12/28/11 1646  WBC 19.7*  HGB 16.6  HCT 46.3  PLT 146*   BMET  Basename 12/28/11 1646  NA 137  K 3.8  CL 99  CO2 27  GLUCOSE 150*  BUN 15  CREATININE 1.09  CALCIUM 9.7   PT/INR  Basename 12/28/11 1646  LABPROT 14.1  INR 1.07   ABG No results found for this basename: PHART:2,PCO2:2,PO2:2,HCO3:2 in the last 72 hours  Studies/Results: No results found.  Anti-infectives: Anti-infectives     Start     Dose/Rate Route Frequency Ordered Stop   12/29/11 1000   ciprofloxacin (CIPRO) IVPB 400 mg        400 mg 200 mL/hr over 60 Minutes Intravenous Every 12 hours 12/28/11 2327            Assessment/Plan: s/p Procedure(s) (LRB): IRRIGATION AND DEBRIDEMENT PERIRECTAL ABSCESS (N/A)  1. Advance diet 2. encourage ambulation/OOB 3. Recheck CBC in am 4. Remove wound packing in am.  Addenum:   Patient required 500cc NS bolus for brief  hypotensive episode, (BP 92/54) following Amlodipine dose.  Patient responded well to fluid bolus. No report of CP, or syncope during hypotensive episode. BP  now improved to 102/68. Patient states that he feels much better.  Wife states that this has happened at least 1 time before; and that his current dose was "reduced" downward from 5 mg to the present 2.5 which he received today with same result. Will hold amlodipine for now, continue to watch BP.   LOS: 1 day    Season Astacio 12/29/2011

## 2011-12-29 NOTE — Progress Notes (Signed)
Doing well now.. Had brief hypotention now improved with fluid. Alert and otherwise no c/o.

## 2011-12-30 ENCOUNTER — Encounter (HOSPITAL_COMMUNITY): Payer: Self-pay | Admitting: General Surgery

## 2011-12-30 ENCOUNTER — Telehealth: Payer: Self-pay | Admitting: Cardiology

## 2011-12-30 LAB — CBC
HCT: 43.3 % (ref 39.0–52.0)
MCHC: 35.8 g/dL (ref 30.0–36.0)
MCV: 92.3 fL (ref 78.0–100.0)
Platelets: 169 10*3/uL (ref 150–400)
RDW: 13.6 % (ref 11.5–15.5)
WBC: 12.9 10*3/uL — ABNORMAL HIGH (ref 4.0–10.5)

## 2011-12-30 MED ORDER — HYDROCODONE-ACETAMINOPHEN 5-325 MG PO TABS
2.0000 | ORAL_TABLET | ORAL | Status: AC
  Administered 2011-12-30: 2 via ORAL

## 2011-12-30 MED ORDER — ASPIRIN 81 MG PO TBEC: 81.0000 mg | DELAYED_RELEASE_TABLET | Freq: Two times a day (BID) | ORAL | Status: DC

## 2011-12-30 MED ORDER — CIPROFLOXACIN HCL 500 MG PO TABS: 500.0000 mg | ORAL_TABLET | Freq: Two times a day (BID) | ORAL | Status: AC

## 2011-12-30 MED ORDER — ALBUTEROL SULFATE HFA 108 (90 BASE) MCG/ACT IN AERS: 2.0000 | INHALATION_SPRAY | RESPIRATORY_TRACT | Status: DC | PRN

## 2011-12-30 MED ORDER — HYDROCODONE-ACETAMINOPHEN 5-325 MG PO TABS: 1.0000 | ORAL_TABLET | ORAL | Status: AC | PRN

## 2011-12-30 MED ORDER — ACETAMINOPHEN 325 MG PO TABS: 650.0000 mg | ORAL_TABLET | Freq: Four times a day (QID) | ORAL | Status: AC | PRN

## 2011-12-30 MED ORDER — CIPROFLOXACIN HCL 500 MG PO TABS
500.0000 mg | ORAL_TABLET | Freq: Two times a day (BID) | ORAL | Status: DC
  Administered 2011-12-30: 500 mg via ORAL
  Filled 2011-12-30 (×4): qty 1

## 2011-12-30 NOTE — Telephone Encounter (Signed)
OK, if BP low, stay off amlodipine.

## 2011-12-30 NOTE — Discharge Summary (Signed)
Physician Discharge Summary  Patient ID: [name] MRN: [id number] DOB/AGE: 67-Nov-1947 67 y.o.  Admit date: 12/28/2011 Discharge date: 12/30/2011  Admission Diagnoses: peri-rectal abcess  Discharge Diagnoses: status post excision and drainage of peri-rectal abcess  Discharged Condition: stable  Hospital Course: The patient is a 66 her white male who started developing some swelling on the left side of his perirectal area on Saturday. Since that time it has become significantly more swollen and tender. He has been running fevers to 101.9. He has been nauseated. He has not eaten since yesterday. Patient was taken to operating room for excision and drainage of this abcess. postoperatively he has done well, and is deemed stable to discharge home with follow-up in 1 weeks time with Dr. Carolynne Edouard.  Consults: None  Significant Diagnostic Studies: labs  and microbiology  Treatments: IV hydration, antibiotics, analgesia and surgery.  Discharge Exam: Blood pressure 123/70, pulse 70, temperature 98.6 F (37 C), temperature source Oral, resp. rate 20, height 5\' 8"  (1.727 m), weight 205 lb (92.987 kg), SpO2 96.00%. General appearance: alert, cooperative, appears stated age and no distress Wound appears to be granulating well, packing was removed and replaced with wet 4x4 dressing, minimal drainage on bandage and there was some odor which is thought to be old drainage fluid and should resolve. Dry dsg was placed. Dr. Jamey Ripa did initial dressing change and examined wound.  Disposition:   Discharge Orders    Future Appointments: Provider: Department: Dept Phone: Center:   01/19/2012 8:30 AM [facility] [phone] [facility]     Medication List  As of 12/30/2011  9:46 AM   ASK your doctor about these medications         ADULT ASPIRIN EC LOW STRENGTH 81 MG EC tablet   Generic drug: aspirin   Take 81 mg by mouth 2 (two) times daily.      albuterol 108 (90 BASE)  MCG/ACT inhaler   Commonly known as: PROVENTIL HFA;VENTOLIN HFA   Inhale 2 puffs into the lungs every 4 (four) hours as needed. For shortness of breath      amLODipine 2.5 MG tablet   Commonly known as: NORVASC   Take 2.5 mg by mouth daily.      ANTIVERT 25 MG tablet   Generic drug: meclizine   Take 25 mg by mouth every 6 (six) hours as needed. For dizziness/vertigo      doxycycline 100 MG capsule   Commonly known as: VIBRAMYCIN   Take 100 mg by mouth 2 (two) times daily. Started on 12/27/11      Fish Oil Oil   Take 1 capsule by mouth 2 (two) times daily.      fluticasone 50 MCG/ACT nasal spray   Commonly known as: FLONASE   Place 2 sprays into the nose daily.      GLUCOSAMINE-CHONDROITIN PO   Take 1 tablet by mouth 2 (two) times daily.      lisinopril 40 MG tablet   Commonly known as: PRINIVIL,ZESTRIL   Take 40 mg by mouth daily.      lovastatin 40 MG tablet   Commonly known as: MEVACOR   Take 40 mg by mouth at bedtime.      metoprolol tartrate 25 MG tablet   Commonly known as: LOPRESSOR   Take 25 mg by mouth 2 (two) times daily.      NIASPAN 1000 MG CR tablet   Generic drug: niacin   Take 1 and 1/2 tablets at bedtime  nitroGLYCERIN 0.4 MG SL tablet   Commonly known as: NITROSTAT   Place 0.4 mg under the tongue every 5 (five) minutes as needed. For chest pain           Follow-up Information    Follow up with TOTH III,PAUL S, MD in 1 week. (If symptoms worsen call office)    Contact information:   [facility] [address] [place] [address] [phone]          Signed: Blenda Mounts 12/30/2011, 9:46 AM

## 2011-12-30 NOTE — Telephone Encounter (Signed)
Spoke with pt's wife. Amlodipine had been decreased to 2.5mg  daily by PCP a few weeks ago due to low BP.  Pt  had surgery by Dr Carolynne Edouard 12/28/11. Amlodipine stopped during hospitalization due to low BP. Pt's wife states they recommended by stay off amlodipine and call Dr Shirlee Latch to let him know pt not taking it now.  Pt's wife will take and record pt's BP and call in 2 weeks with the readings. I will forward to Dr Shirlee Latch for review.

## 2011-12-30 NOTE — Discharge Planning (Signed)
Patient discharged home in stable condition. Verbalizes understanding of all discharge instructions, including home medications and follow up appointments. 

## 2011-12-30 NOTE — Telephone Encounter (Signed)
New Problem:    Patient's wife wanted to let you know that after having going to the ER he was taken off of his amLODipine (NORVASC) tablet 2.5 mg because his BP was droppng too low and wanted to know how to proceed.  Please call back.

## 2011-12-30 NOTE — Telephone Encounter (Signed)
Pt's wife aware.

## 2011-12-31 LAB — CULTURE, ROUTINE-ABSCESS: Gram Stain: NONE SEEN

## 2012-01-04 LAB — ANAEROBIC CULTURE

## 2012-01-05 ENCOUNTER — Ambulatory Visit (INDEPENDENT_AMBULATORY_CARE_PROVIDER_SITE_OTHER): Payer: Medicare Other | Admitting: General Surgery

## 2012-01-05 ENCOUNTER — Encounter (INDEPENDENT_AMBULATORY_CARE_PROVIDER_SITE_OTHER): Payer: Self-pay | Admitting: General Surgery

## 2012-01-05 VITALS — BP 110/72 | HR 60 | Temp 97.5°F | Resp 16 | Ht 68.0 in | Wt 200.5 lb

## 2012-01-05 DIAGNOSIS — K611 Rectal abscess: Secondary | ICD-10-CM | POA: Insufficient documentation

## 2012-01-05 DIAGNOSIS — K612 Anorectal abscess: Secondary | ICD-10-CM

## 2012-01-05 NOTE — Patient Instructions (Signed)
Continue daily wash/shower.  Follow up with Dr. Carolynne Edouard in 2-3 weeks.

## 2012-01-05 NOTE — Assessment & Plan Note (Signed)
Pt is doing well.  Advised to continue warm water soaks/showers.  Wound will take a bit longer to heal.  Follow up with Dr. Carolynne Edouard in 2-3 weeks.

## 2012-01-05 NOTE — Progress Notes (Signed)
HISTORY: [name] doing pretty well after I&D perirectal abscess by Dr. Carolynne Edouard.  He is no longer packing it.  He is keeping wound clean.  He is not taking narcotics.  He denies fevers/ chills.     EXAM: General:  A&O times 3 Incision:  Left incision without active cellulitis  Some drainage is coming from wound, but no firmness of buttocks.  Wound edges are healthy.   ASSESSMENT AND PLAN:   Perirectal abscess [name] is doing well.  Advised to continue warm water soaks/showers.  Wound will take a bit longer to heal.  Follow up with Dr. Carolynne Edouard in 2-3 weeks.        Maudry Diego, MD Surgical Oncology, General & Endocrine Surgery [facility]  Crawford Givens, MD Joaquim Nam, MD

## 2012-01-19 ENCOUNTER — Other Ambulatory Visit (INDEPENDENT_AMBULATORY_CARE_PROVIDER_SITE_OTHER): Payer: Medicare Other

## 2012-01-19 DIAGNOSIS — E7800 Pure hypercholesterolemia, unspecified: Secondary | ICD-10-CM

## 2012-01-19 DIAGNOSIS — I2510 Atherosclerotic heart disease of native coronary artery without angina pectoris: Secondary | ICD-10-CM | POA: Diagnosis not present

## 2012-01-20 LAB — HEPATIC FUNCTION PANEL
Albumin: 3.9 g/dL (ref 3.6–4.8)
Alkaline Phosphatase: 48 IU/L (ref 25–160)
Bilirubin, Direct: 0.23 mg/dL (ref 0.00–0.40)
Total Bilirubin: 0.9 mg/dL (ref 0.0–1.2)
Total Protein: 6.1 g/dL (ref 6.0–8.5)

## 2012-01-20 LAB — LIPID PANEL: Chol/HDL Ratio: 4.4 ratio units (ref 0.0–5.0)

## 2012-02-03 ENCOUNTER — Encounter (INDEPENDENT_AMBULATORY_CARE_PROVIDER_SITE_OTHER): Payer: Self-pay | Admitting: General Surgery

## 2012-02-03 ENCOUNTER — Ambulatory Visit (INDEPENDENT_AMBULATORY_CARE_PROVIDER_SITE_OTHER): Payer: Medicare Other | Admitting: General Surgery

## 2012-02-03 VITALS — BP 146/96 | HR 63 | Temp 98.6°F | Ht 68.0 in | Wt 203.8 lb

## 2012-02-03 DIAGNOSIS — K611 Rectal abscess: Secondary | ICD-10-CM

## 2012-02-03 DIAGNOSIS — K612 Anorectal abscess: Secondary | ICD-10-CM

## 2012-02-03 NOTE — Patient Instructions (Signed)
Keep area clean and dry. °

## 2012-02-03 NOTE — Progress Notes (Signed)
Subjective:     Patient ID: [name], male   DOB: 1946-04-10, 66 y.o.   MRN: [id number]  HPI The patient is a 66 year old white male who is about a month out from an incision and drainage of a perirectal abscess. He denies any pain. He initially had some drainage but this has tapered off to almost nothing now. His bowels are working normally. He is keeping the area clean and dry  Review of Systems     Objective:   Physical Exam On exam the wound is almost completely healed. There is a little bit of granulation tissue along the center of the incision. There is no sign of infection.    Assessment:     Status post incision and drainage of a perirectal abscess    Plan:     At this point I have encouraged him to keep the area clean and dry and shower daily. We will plan to see him back in one month to check the wound.

## 2012-03-09 ENCOUNTER — Ambulatory Visit (INDEPENDENT_AMBULATORY_CARE_PROVIDER_SITE_OTHER): Payer: Medicare Other | Admitting: General Surgery

## 2012-03-09 VITALS — BP 166/88 | HR 76 | Temp 97.9°F | Resp 18 | Ht 68.5 in | Wt 202.0 lb

## 2012-03-09 DIAGNOSIS — K612 Anorectal abscess: Secondary | ICD-10-CM

## 2012-03-09 DIAGNOSIS — K611 Rectal abscess: Secondary | ICD-10-CM

## 2012-03-09 NOTE — Patient Instructions (Signed)
May return to all normal activities 

## 2012-03-22 ENCOUNTER — Encounter (INDEPENDENT_AMBULATORY_CARE_PROVIDER_SITE_OTHER): Payer: Self-pay | Admitting: General Surgery

## 2012-03-22 NOTE — Progress Notes (Signed)
Subjective:     Patient ID: [name], male   DOB: July 14, 1945, 66 y.o.   MRN: [id number]  HPI The patient is a 66 year old white male who is a couple months status post incision and drainage of a perirectal abscess. He is doing very well now and feels as though he is completely healed. He denies any rectal pain. His appetite is good and his bowels are working normally.  Review of Systems     Objective:   Physical Exam On exam the area of the operation has completely healed. There is no evidence of infection. He has good rectal tone.    Assessment:     Status post incision and drainage of perirectal abscess now resolved    Plan:     At this point returned all his normal activities without any restrictions. We will plan to see him back on a when necessary basis

## 2012-03-26 ENCOUNTER — Other Ambulatory Visit: Payer: Self-pay | Admitting: Cardiology

## 2012-03-29 NOTE — Telephone Encounter (Signed)
Refilled lisinopril

## 2012-04-25 ENCOUNTER — Other Ambulatory Visit: Payer: Self-pay | Admitting: Cardiology

## 2012-06-05 ENCOUNTER — Encounter: Payer: Self-pay | Admitting: Family Medicine

## 2012-06-05 ENCOUNTER — Telehealth: Payer: Self-pay | Admitting: *Deleted

## 2012-06-05 ENCOUNTER — Ambulatory Visit (INDEPENDENT_AMBULATORY_CARE_PROVIDER_SITE_OTHER): Payer: Medicare Other | Admitting: Family Medicine

## 2012-06-05 VITALS — BP 142/86 | HR 61 | Temp 98.0°F | Wt 209.0 lb

## 2012-06-05 DIAGNOSIS — J449 Chronic obstructive pulmonary disease, unspecified: Secondary | ICD-10-CM

## 2012-06-05 MED ORDER — ALBUTEROL SULFATE HFA 108 (90 BASE) MCG/ACT IN AERS: 2.0000 | INHALATION_SPRAY | RESPIRATORY_TRACT | Status: DC | PRN

## 2012-06-05 MED ORDER — AZITHROMYCIN 250 MG PO TABS: ORAL_TABLET | ORAL | Status: DC

## 2012-06-05 NOTE — Assessment & Plan Note (Signed)
If continues need for the albuterol inhaler more than 2-3 times a week, then would need recheck for consideration of longer acting agent.  D/w pt.

## 2012-06-05 NOTE — Assessment & Plan Note (Signed)
Start zithromax, continue flonase and use SABA prn.  Nontoxic, f/u prn.

## 2012-06-05 NOTE — Progress Notes (Signed)
3 weeks cough and nasal gtt, facial HA.  Foul taste in mouth more recently.  Needs refill on SABA, has been out for a few months. Cough is dry.  No ear pain.  No fevers.  H/o likely tinnitus and wife has noted long standing hearing loss.  Meds, vitals, and allergies reviewed.   ROS: See HPI.  Otherwise, noncontributory.  GEN: nad, alert and oriented HEENT: mucous membranes moist, tm w/o erythema, nasal exam w/erythema B, clear discharge noted, OP with cobblestoning NECK: supple w/o LA CV: rrr.   PULM: ctab, no inc wob EXT: no edema SKIN: no acute rash

## 2012-06-05 NOTE — Telephone Encounter (Signed)
Faxed

## 2012-06-05 NOTE — Telephone Encounter (Signed)
Proventil and Ventolin are not covered.  ProAir is.  Can they use ProAir?

## 2012-06-05 NOTE — Telephone Encounter (Signed)
Yes, please make the change.  Same sig, disp 1 inhaler, same number of refills.

## 2012-06-05 NOTE — Patient Instructions (Addendum)
Start the antibiotics today, use the inhaler if needed and use the nasal spray daily.  If you continue need/use the albuterol inhaler more than 2-3 times a week (when you are feeling better), then come back for me to test your breathing.  It you come for that appointment, try not to use the inhaler for 4 hours before the visit.   Take care.

## 2012-09-03 ENCOUNTER — Other Ambulatory Visit: Payer: Self-pay | Admitting: *Deleted

## 2012-09-03 MED ORDER — LOVASTATIN 40 MG PO TABS: 40.0000 mg | ORAL_TABLET | Freq: Every day | ORAL | Status: DC

## 2012-09-07 ENCOUNTER — Telehealth: Payer: Self-pay

## 2012-09-07 NOTE — Telephone Encounter (Signed)
I would price check the OTC niacin.  He should be able to get 500mg  tabs.  I would try to take 2 at night.  He may have more skin flushing with this.  Taking the aspirin about ahead of the niacin may help with that.

## 2012-09-07 NOTE — Telephone Encounter (Signed)
Pt taking Niacin CR 1000 mg 1 1/2 tab daily; cost of Niacin $290/month;this includes what Medicare pays and pt's out of pocket. Pt fearful will reach donut hole by 3rd quarter and wants to know if less expensive substitute pt can take.Please advise. [facility] [address]

## 2012-09-07 NOTE — Telephone Encounter (Signed)
Patient advised.

## 2012-10-15 ENCOUNTER — Other Ambulatory Visit: Payer: Self-pay | Admitting: Cardiology

## 2012-11-19 ENCOUNTER — Telehealth: Payer: Self-pay | Admitting: Family Medicine

## 2012-11-19 NOTE — Telephone Encounter (Signed)
Patient Information:  Caller Name: [name]  Phone: [phone]  Patient: [name]  Gender: Male  DOB: [birth year]  Age: 67 Years  PCP: Crawford Givens Clelia Croft) [facility])  Office Follow Up:  Does the office need to follow up with this patient?: No  Instructions For The Office: N/A   Symptoms  Reason For Call & Symptoms: Has frequent dry cough that comes and goes for past 3-4 weeks.  Reviewed Health History In EMR: Yes  Reviewed Medications In EMR: Yes  Reviewed Allergies In EMR: Yes  Reviewed Surgeries / Procedures: Yes  Date of Onset of Symptoms: 10/29/2012  Treatments Tried: Musinex, Sinus medication,  Treatments Tried Worked: No  Guideline(s) Used:  Cough  Disposition Per Guideline:   See Today in Office  Reason For Disposition Reached:   Known COPD or other severe lung disease (i.e., bronchiectasis, cystic fibrosis, lung surgery) and worsening symptoms (i.e., increased sputum purulence or amount, increased breathing difficulty)  Advice Given:  Reassurance  Coughing is the way that our lungs remove irritants and mucus. It helps protect our lungs from getting pneumonia.  You can get a dry hacking cough after a chest cold. Sometimes this type of cough can last 1-3 weeks, and be worse at night.  You can also get a cough after being exposed to irritating substances like smoke, strong perfumes, and dust.  Here is some care advice that should help.  Coughing Spasms:  Drink warm fluids. Inhale warm mist (Reason: both relax the airway and loosen up the phlegm).  Suck on cough drops or hard candy to coat the irritated throat.  Prevent Dehydration:  Drink adequate liquids.  This will help soothe an irritated or dry throat and loosen up the phlegm.  Avoid Tobacco Smoke:  Smoking or being exposed to smoke makes coughs much worse.  Call Back If:  Difficulty breathing  Cough lasts more than 3 weeks  Fever lasts > 3 days  You become worse.  Expected Course:   The  expected course depends on what is causing the cough.  Viral bronchitis (chest cold) causes a cough that lasts 1 to 3 weeks. Sometimes you may cough up lots of phlegm (sputum, mucus). The mucus can normally be white, gray, yellow, or green.  Patient Will Follow Care Advice:  YES  Appointment Scheduled:  11/20/2012 11:15:00 Appointment Scheduled Provider:  Crawford Givens Clelia Croft) [facility])

## 2012-11-20 ENCOUNTER — Encounter: Payer: Self-pay | Admitting: Family Medicine

## 2012-11-20 ENCOUNTER — Ambulatory Visit (INDEPENDENT_AMBULATORY_CARE_PROVIDER_SITE_OTHER)
Admission: RE | Admit: 2012-11-20 | Discharge: 2012-11-20 | Disposition: A | Discharge status: Home / Self Care | Payer: Medicare Other | Source: Ambulatory Visit | Attending: Family Medicine | Admitting: Family Medicine

## 2012-11-20 ENCOUNTER — Ambulatory Visit (INDEPENDENT_AMBULATORY_CARE_PROVIDER_SITE_OTHER): Payer: Medicare Other | Admitting: Family Medicine

## 2012-11-20 VITALS — BP 144/76 | HR 72 | Temp 97.5°F | Wt 205.0 lb

## 2012-11-20 DIAGNOSIS — R05 Cough: Secondary | ICD-10-CM

## 2012-11-20 DIAGNOSIS — F17200 Nicotine dependence, unspecified, uncomplicated: Secondary | ICD-10-CM | POA: Diagnosis not present

## 2012-11-20 DIAGNOSIS — R059 Cough, unspecified: Secondary | ICD-10-CM

## 2012-11-20 MED ORDER — RANITIDINE HCL 150 MG PO TABS: 150.0000 mg | ORAL_TABLET | Freq: Two times a day (BID) | ORAL | Status: DC

## 2012-11-20 MED ORDER — ALBUTEROL SULFATE HFA 108 (90 BASE) MCG/ACT IN AERS: 2.0000 | INHALATION_SPRAY | RESPIRATORY_TRACT | Status: DC | PRN

## 2012-11-20 NOTE — Progress Notes (Signed)
He has had a cough for a few (~3) weeks.  He had been using SABA daily with some relief.  Prev he was using it rarely.  Cough is dry.  No sputum.  Cough is intermittent.  Today is the best day he's had in ~1.5 weeks.  Prev he's had a good days followed by more coughing. No fevers.  No wheeze noted by patient but his wife had noted it.  Not SOB, not more than he would expect.  He can push mow his yard, but "I'm not 39 anymore."  No CP. He's on ACE.  No typical seasonal allergy sx now, ie itchy eyes, etc.  He doesn't have typical seasonal allergy sx like this in general.  No GERD sx but he has noted halitosis once.    Meds, vitals, and allergies reviewed.   ROS: See HPI.  Otherwise, noncontributory.  GEN: nad, alert and oriented HEENT: mucous membranes moist NECK: supple w/o LA CV: rrr.   PULM: ctab, no inc wob, dry cough occ noted.  ABD: soft, +bs EXT: no edema SKIN: no acute rash

## 2012-11-20 NOTE — Patient Instructions (Addendum)
Go to the lab on the way out.  We'll contact you with your xray report. Take zantac/ranitidine 150mg  twice a day.  If the cough doesn't improve in about 10 days, then let me know.  Take care.

## 2012-11-21 ENCOUNTER — Telehealth: Payer: Self-pay | Admitting: *Deleted

## 2012-11-21 MED ORDER — ALBUTEROL SULFATE HFA 108 (90 BASE) MCG/ACT IN AERS: 2.0000 | INHALATION_SPRAY | RESPIRATORY_TRACT | Status: DC | PRN

## 2012-11-21 NOTE — Telephone Encounter (Signed)
[facility] [address] faxed note stating that pt's insurance prefers proair over the proventil that was prescribed yesterday.  Please advise.

## 2012-11-21 NOTE — Assessment & Plan Note (Signed)
ddx d/w pt.  COPD, allergies, GERD, postnasal gtt, ACE.  CXR w/o acute changes, reviewed the images personally.   Would treat for gerd in meantime, consider ACE change if not improved.  He has no wheeze today, so we didn't change his inhalers.  Plan d/w pt.  He agrees. >25 min spent with face to face with patient, >50% counseling and/or coordinating care.

## 2012-11-21 NOTE — Telephone Encounter (Signed)
Sent!

## 2012-12-07 ENCOUNTER — Telehealth: Payer: Self-pay | Admitting: Cardiology

## 2012-12-07 NOTE — Telephone Encounter (Signed)
New problem    Dental procedure on 6/10  Please advise if patient can take valium .

## 2012-12-07 NOTE — Telephone Encounter (Signed)
Will forward to Dr McLean for review and recommendations.  

## 2012-12-07 NOTE — Telephone Encounter (Signed)
Yes - that would be fine

## 2012-12-07 NOTE — Telephone Encounter (Signed)
LM on voice ID voice mail that it would be fine.

## 2012-12-17 ENCOUNTER — Emergency Department: Payer: Self-pay | Admitting: Emergency Medicine

## 2012-12-17 DIAGNOSIS — R05 Cough: Secondary | ICD-10-CM | POA: Diagnosis not present

## 2012-12-17 DIAGNOSIS — Z95818 Presence of other cardiac implants and grafts: Secondary | ICD-10-CM | POA: Diagnosis not present

## 2012-12-17 DIAGNOSIS — J209 Acute bronchitis, unspecified: Secondary | ICD-10-CM | POA: Diagnosis not present

## 2012-12-17 DIAGNOSIS — I10 Essential (primary) hypertension: Secondary | ICD-10-CM | POA: Diagnosis not present

## 2012-12-17 DIAGNOSIS — I252 Old myocardial infarction: Secondary | ICD-10-CM | POA: Diagnosis not present

## 2012-12-17 DIAGNOSIS — Z87891 Personal history of nicotine dependence: Secondary | ICD-10-CM | POA: Diagnosis not present

## 2012-12-17 LAB — CBC WITH DIFFERENTIAL/PLATELET
Basophil #: 0 10*3/uL (ref 0.0–0.1)
Basophil %: 0.3 %
Eosinophil #: 0.1 10*3/uL (ref 0.0–0.7)
HCT: 48.8 % (ref 40.0–52.0)
HGB: 16.7 g/dL (ref 13.0–18.0)
Lymphocyte #: 1.3 10*3/uL (ref 1.0–3.6)
Lymphocyte %: 9.3 %
MCH: 32 pg (ref 26.0–34.0)
Monocyte %: 7.2 %
Neutrophil #: 11.8 10*3/uL — ABNORMAL HIGH (ref 1.4–6.5)
RBC: 5.22 10*6/uL (ref 4.40–5.90)

## 2012-12-17 LAB — BASIC METABOLIC PANEL
BUN: 11 mg/dL (ref 7–18)
Calcium, Total: 9.1 mg/dL (ref 8.5–10.1)
Chloride: 102 mmol/L (ref 98–107)
Co2: 30 mmol/L (ref 21–32)
Creatinine: 1.01 mg/dL (ref 0.60–1.30)
EGFR (African American): >60
EGFR (Non-African Amer.): >60
Glucose: 194 mg/dL — ABNORMAL HIGH (ref 65–99)
Osmolality: 279 (ref 275–301)
Sodium: 137 mmol/L (ref 136–145)

## 2012-12-27 ENCOUNTER — Ambulatory Visit (INDEPENDENT_AMBULATORY_CARE_PROVIDER_SITE_OTHER): Payer: Medicare Other | Admitting: Family Medicine

## 2012-12-27 ENCOUNTER — Encounter: Payer: Self-pay | Admitting: Family Medicine

## 2012-12-27 VITALS — BP 126/72 | HR 68 | Temp 98.0°F | Wt 197.0 lb

## 2012-12-27 DIAGNOSIS — R05 Cough: Secondary | ICD-10-CM | POA: Diagnosis not present

## 2012-12-27 MED ORDER — BENZONATATE 200 MG PO CAPS: 200.0000 mg | ORAL_CAPSULE | Freq: Three times a day (TID) | ORAL | Status: DC | PRN

## 2012-12-27 NOTE — Patient Instructions (Signed)
Use tessalon and the inhaler for the cough. This should slowly get better.  If you have a fever or feel progressively worse, then let us know.  Take care.

## 2012-12-27 NOTE — Progress Notes (Signed)
Patient went to [facility] on June 9 and was diagnosed with bronchitis.  Was given IVF, Clindamycin and Bromfed for home therapy.  Didn't stay overnight at ER, wasn't admitted.  Patient has finished Bromfed and has a few Clindamycin left, he says probably because he missed a few.  Has been coughing a lot today.  Now with some less cough.  Prev with foul taste in his mouth, improved on abx.  Frequent cough drop use.  No fevers.  Still using his inhalers.  Minimal wheeze per patient. His cough is 50% better from the time of ER eval; he was improving more quickly initially.    Recently had a root canal, too.    Meds, vitals, and allergies reviewed.   ROS: See HPI.  Otherwise, noncontributory.  nad ncat Mmm, OP wnl Neck supple, no LA rrr ctab except for faint rhonchi and wheeze in the RLL, no inc in wob abd soft Ext w/o edema; well perfused

## 2012-12-28 NOTE — Assessment & Plan Note (Signed)
Should likely improve slowly, likely postinfectious.  Overall improved so far.  Continue SABA TID and add on tessalon for cough, f/u if not improved.  He agrees.  Nontoxic.

## 2013-01-13 ENCOUNTER — Other Ambulatory Visit: Payer: Self-pay | Admitting: Cardiology

## 2013-02-19 ENCOUNTER — Other Ambulatory Visit: Payer: Self-pay | Admitting: *Deleted

## 2013-02-19 MED ORDER — LISINOPRIL 40 MG PO TABS: ORAL_TABLET | ORAL | Status: DC

## 2013-02-22 ENCOUNTER — Encounter: Payer: Self-pay | Admitting: Family Medicine

## 2013-02-22 ENCOUNTER — Ambulatory Visit (INDEPENDENT_AMBULATORY_CARE_PROVIDER_SITE_OTHER): Payer: Medicare Other | Admitting: Family Medicine

## 2013-02-22 VITALS — BP 144/80 | HR 65 | Temp 97.9°F | Wt 203.0 lb

## 2013-02-22 DIAGNOSIS — M549 Dorsalgia, unspecified: Secondary | ICD-10-CM | POA: Diagnosis not present

## 2013-02-22 MED ORDER — CYCLOBENZAPRINE HCL 10 MG PO TABS: 5.0000 mg | ORAL_TABLET | Freq: Three times a day (TID) | ORAL | Status: DC | PRN

## 2013-02-22 NOTE — Progress Notes (Signed)
Back pain. Started about 2-3 weeks ago.  Worse pain getting out of bed.  Taking ibuprofen with some help.  Tried to play golf yesterday.  5th tee- pain with a drive and pain since then.  Taking some flexeril with some relief.  Pain at the belt, just lateral to the midline B.   Also with resolving itchy rash on the R buttocks, not painful and resolving.    Meds, vitals, and allergies reviewed.   ROS: See HPI.  Otherwise, noncontributory.  nad but uncomfortable ncat Mmm rrr ctab Abd soft Back w/o midline pain but ttp just lateral to the L spine B Pain with facet loading.  resolving rash on the R buttocks, cluster of hyperpigmented lesions that could have been a mild zoster.  It appears to be resolving w/o complication.

## 2013-02-22 NOTE — Patient Instructions (Signed)
Take the ibuprofen with food and then the flexeril for muscle pain.   Gently stretch your back.   Flexeril can make your drowsy.

## 2013-02-24 DIAGNOSIS — M549 Dorsalgia, unspecified: Secondary | ICD-10-CM | POA: Insufficient documentation

## 2013-02-24 NOTE — Assessment & Plan Note (Signed)
Likely muscle strain, reaggrevated.  Would use flexeril/ibuprofen with cautions.  He understood.   Also with resolving rash on the R buttocks, cluster of hyperpigmented lesions that could have been a mild zoster.  It appears to be resolving w/o complication. No tx needed.

## 2013-03-13 ENCOUNTER — Other Ambulatory Visit: Payer: Self-pay

## 2013-03-13 MED ORDER — METOPROLOL TARTRATE 25 MG PO TABS: ORAL_TABLET | ORAL | Status: DC

## 2013-04-01 ENCOUNTER — Other Ambulatory Visit: Payer: Self-pay | Admitting: Cardiology

## 2013-04-05 ENCOUNTER — Other Ambulatory Visit: Payer: Self-pay | Admitting: Cardiology

## 2013-04-17 ENCOUNTER — Ambulatory Visit (INDEPENDENT_AMBULATORY_CARE_PROVIDER_SITE_OTHER): Payer: Medicare Other | Admitting: Family Medicine

## 2013-04-17 ENCOUNTER — Telehealth: Payer: Self-pay | Admitting: Family Medicine

## 2013-04-17 ENCOUNTER — Encounter: Payer: Self-pay | Admitting: Family Medicine

## 2013-04-17 VITALS — BP 140/80 | HR 70 | Temp 98.0°F | Wt 202.0 lb

## 2013-04-17 DIAGNOSIS — R05 Cough: Secondary | ICD-10-CM | POA: Diagnosis not present

## 2013-04-17 DIAGNOSIS — R059 Cough, unspecified: Secondary | ICD-10-CM

## 2013-04-17 MED ORDER — ALBUTEROL SULFATE HFA 108 (90 BASE) MCG/ACT IN AERS: 2.0000 | INHALATION_SPRAY | Freq: Three times a day (TID) | RESPIRATORY_TRACT | Status: DC | PRN

## 2013-04-17 MED ORDER — LEVOFLOXACIN 500 MG PO TABS: 500.0000 mg | ORAL_TABLET | Freq: Every day | ORAL | Status: DC

## 2013-04-17 MED ORDER — BENZONATATE 200 MG PO CAPS: 200.0000 mg | ORAL_CAPSULE | Freq: Three times a day (TID) | ORAL | Status: DC | PRN

## 2013-04-17 NOTE — Telephone Encounter (Signed)
Put him in at 4:15.  Thanks.

## 2013-04-17 NOTE — Patient Instructions (Signed)
Start the antibiotics today. Use the tessalon for cough.  Use the inhaler 3 times a day.  If you get much worse, then go to the ER.  When you get to feeling better, come back so we can discuss options/inhalers.  Take care.

## 2013-04-17 NOTE — Progress Notes (Signed)
Sx stated about 5 days ago.  Fatigued.  "Snot everywhere."  Cough triggered a deep breath.  Slightly more SOB than typical.  No fevers.  Some aches from coughing. No sick contacts.  Sweats noted.  Using SABA BID.  Meds, vitals, and allergies reviewed.   ROS: See HPI.  Otherwise, noncontributory.  nad ncat Tm wnl Nasal exam slightly stuffy OP with mild irritation Neck supple No LA rrr ctab except for polyphonic exp wheeze only noted on the LLL No inc in WOB Speaking in complete sentences.

## 2013-04-17 NOTE — Telephone Encounter (Signed)
Rose spoke with pt and pt will se Dr Para March today at 4:15pm.

## 2013-04-17 NOTE — Telephone Encounter (Signed)
Pt says he has a bad cough and severe head/chest congestion.  He says in the Spring he had a case of congestion that turned into pneumonia and he wants to prevent that.  He's wanting to be seen today if at all possible.  I tried to schedule him w/Regina Baity but pt says he prefers to see you.  Can you open a slot for him to be seen sometime today for an acute visit? Thank you.

## 2013-04-17 NOTE — Telephone Encounter (Signed)
Pt scheduled at 4:15 pm

## 2013-04-18 NOTE — Assessment & Plan Note (Signed)
Concern for an early PNA.  Would start abx, inc SABA use and use tessalon for cough.  Imaging wouldn't likely change mgmt now, pt agrees. If worsening, to ER.  He agrees.  We'll address his inhalers after he is feeling better, ie consider symbicort or similar.

## 2013-04-22 ENCOUNTER — Ambulatory Visit (INDEPENDENT_AMBULATORY_CARE_PROVIDER_SITE_OTHER)
Admission: RE | Admit: 2013-04-22 | Discharge: 2013-04-22 | Disposition: A | Discharge status: Home / Self Care | Payer: Medicare Other | Source: Ambulatory Visit | Attending: Family Medicine | Admitting: Family Medicine

## 2013-04-22 ENCOUNTER — Telehealth: Payer: Self-pay

## 2013-04-22 ENCOUNTER — Ambulatory Visit (INDEPENDENT_AMBULATORY_CARE_PROVIDER_SITE_OTHER): Payer: Medicare Other | Admitting: Family Medicine

## 2013-04-22 ENCOUNTER — Encounter: Payer: Self-pay | Admitting: Family Medicine

## 2013-04-22 VITALS — BP 148/80 | HR 66 | Temp 98.0°F | Wt 201.0 lb

## 2013-04-22 DIAGNOSIS — R05 Cough: Secondary | ICD-10-CM

## 2013-04-22 MED ORDER — PREDNISONE 20 MG PO TABS: ORAL_TABLET | ORAL | Status: DC

## 2013-04-22 MED ORDER — LEVOFLOXACIN 500 MG PO TABS: 500.0000 mg | ORAL_TABLET | Freq: Every day | ORAL | Status: DC

## 2013-04-22 NOTE — Telephone Encounter (Signed)
Pt was seen 04/17/13; still non prod cough,still SOB upon exertion, pt cannot see improvement except pt can eat now.No fever. Pt said it is like he cannot get air in his lungs. Pt wants to know what is next step; pt has one more antibiotic to take and pt does not want to go to ED. [facility] [address]Please advise.

## 2013-04-22 NOTE — Patient Instructions (Signed)
Take prednisone with food.  Continue the antibiotics for now.  We'll contact you with your xray report. Call me with an update tomorrow.  We'll still need to work on your inhalers when you are feeling better.  If worsening, then go to the ER.  Take care.

## 2013-04-22 NOTE — Telephone Encounter (Signed)
Please get him on the schedule for lunchtime today if possible. Thanks.

## 2013-04-22 NOTE — Telephone Encounter (Signed)
Pt coming in to be seen at 12:15pm

## 2013-04-22 NOTE — Assessment & Plan Note (Addendum)
Likely copd exacerbation.  Would continue the abx for now. Add on pred taper with GI caution.  If worsening, to ER.  Continue SABA for now.  He agrees.  We'll address his inhalers after this episode is resolved.  CXR reviewed.  Okay for outpatient f/u.  >25 min spent with face to face with patient, >50% counseling and/or coordinating care. He'll update me tomorrow.

## 2013-04-22 NOTE — Progress Notes (Signed)
Seen lasted week, treated presumptively for PNA.  He still doesn't have much appetite. No fevers.  Still wheezing, more so recently.  Still coughing, some better with tessalon.  He gets winded walking, esp if in a hurry.  Still with rhinorrhea.  No fevers.   Meds, vitals, and allergies reviewed.   ROS: See HPI.  Otherwise, noncontributory.  nad ncat Mmm Neck supple, no LA rrr Diffuse wheeze initially, exp >insp, B, no rhonchi, no focal dec in BS Recheck after neb- sig dec in wheeze and no inc in wob abd soft Ext w/o edema

## 2013-05-02 ENCOUNTER — Ambulatory Visit (INDEPENDENT_AMBULATORY_CARE_PROVIDER_SITE_OTHER): Payer: Medicare Other | Admitting: Cardiology

## 2013-05-02 ENCOUNTER — Encounter: Payer: Self-pay | Admitting: Cardiology

## 2013-05-02 VITALS — BP 149/83 | HR 77 | Ht 68.5 in | Wt 202.0 lb

## 2013-05-02 DIAGNOSIS — J449 Chronic obstructive pulmonary disease, unspecified: Secondary | ICD-10-CM | POA: Diagnosis not present

## 2013-05-02 DIAGNOSIS — I10 Essential (primary) hypertension: Secondary | ICD-10-CM

## 2013-05-02 DIAGNOSIS — I2510 Atherosclerotic heart disease of native coronary artery without angina pectoris: Secondary | ICD-10-CM

## 2013-05-02 DIAGNOSIS — E785 Hyperlipidemia, unspecified: Secondary | ICD-10-CM | POA: Diagnosis not present

## 2013-05-02 MED ORDER — LOVASTATIN 40 MG PO TABS: 80.0000 mg | ORAL_TABLET | Freq: Every day | ORAL | Status: DC

## 2013-05-02 MED ORDER — HYDROCHLOROTHIAZIDE 25 MG PO TABS: 25.0000 mg | ORAL_TABLET | Freq: Every day | ORAL | Status: DC

## 2013-05-02 NOTE — Patient Instructions (Signed)
Your physician recommends that you return for lab work in: May 16, 2013 at [facility]  Your physician has recommended you make the following change in your medication: Start taking Hydrochlorothiazide 25 mg daily. Stop taking Niaspan and start taking Lovastatin 80 mg at bedtime.  Ann, Dr Alford Highland nurse will call you in 2 weeks to check on your blood pressure  Your physician wants you to follow-up in: 1 year. You will receive a reminder letter in the mail two months in advance. If you don't receive a letter, please call our office to schedule the follow-up appointment.

## 2013-05-03 NOTE — Progress Notes (Signed)
Patient ID: [name], male   DOB: 09-19-1945, 67 y.o.   MRN: [id number] PCP: Dr. Para March  67 yo with history of CAD s/p LAD and RCA PCIs presents for followup.  He has not had any chest pain.  He has been treated recently for a COPD exacerbation and is beginning to feel better with less cough. He denies exertional dyspnea currently but is still wheezing occasionally.   BP has been running high.  Weight is down 11 lbs compared to prior appointment.   ECG: NSR, normal  Labs (7/11): LDL 76, HDl 35, K 4.6, creatinine 1.1 Labs (6/12): LDL 59, HDL 29 Labs (7/12): K 4.2, creatinine 0.96 Labs (6/13): K 3.8, creatinine 1.09 Labs (7/13): LDL 76, HDL 35  Allergies (verified):  No Known Drug Allergies  Past Medical History: 1. Coronary artery disease.  The patient had a myocardial infarction in 1999 and had a stent placed in his LAD.  He had an inferior MI in 2005 and had a drug-eluting stent placed in his RCA.  His most recent functional study was Myoview in June 2007 with an EF of 61% and no evidence for ischemia. 2. Hypercholesterolemia. 3. Osteoarthritis of the hip and the knee. 4. COPD  Family History: Father: DECEASED 80;  CHF Mother: DECEASED 66: OLD AGE  Social History: The patient works in [employer] for a [employer].  He is a past smoker.  He lives in [place].  ROS: All systems reviewed and negative except as per HPI.   Current Outpatient Prescriptions  Medication Sig Dispense Refill  . albuterol (PROAIR HFA) 108 (90 BASE) MCG/ACT inhaler Inhale 2 puffs into the lungs 3 (three) times daily as needed (cough).  18 g  2  . benzonatate (TESSALON) 200 MG capsule Take 1 capsule (200 mg total) by mouth 3 (three) times daily as needed for cough.      . cyclobenzaprine (FLEXERIL) 10 MG tablet Take 0.5-1 tablets (5-10 mg total) by mouth 3 (three) times daily as needed for muscle spasms.  30 tablet  1  . Fish Oil OIL Take 1 capsule by mouth 2 (two) times daily.        .  fluticasone (FLONASE) 50 MCG/ACT nasal spray Place 2 sprays into the nose daily.      Marland Kitchen GLUCOSAMINE-CHONDROITIN PO Take 1 tablet by mouth 2 (two) times daily.        Marland Kitchen HYDROcodone-acetaminophen (NORCO/VICODIN) 5-325 MG per tablet       . levofloxacin (LEVAQUIN) 500 MG tablet Take 1 tablet (500 mg total) by mouth daily.  5 tablet  0  . lisinopril (PRINIVIL,ZESTRIL) 40 MG tablet TAKE ONE TABLET BY MOUTH ONCE DAILY  30 tablet  0  . meclizine (ANTIVERT) 25 MG tablet Take 25 mg by mouth every 6 (six) hours as needed. For dizziness/vertigo      . metoprolol tartrate (LOPRESSOR) 25 MG tablet TAKE ONE TABLET BY MOUTH TWICE DAILY  60 tablet  1  . nitroGLYCERIN (NITROSTAT) 0.4 MG SL tablet Place 0.4 mg under the tongue every 5 (five) minutes as needed. For chest pain      . predniSONE (DELTASONE) 20 MG tablet 3 tabs a day for 3 days, then 2 tabs a day for 3 days, then 1 tab a day for 3 days. With food.  18 tablet  0  . ranitidine (ZANTAC) 150 MG tablet Take 1 tablet (150 mg total) by mouth 2 (two) times daily.      . hydrochlorothiazide (HYDRODIURIL)  25 MG tablet Take 1 tablet (25 mg total) by mouth daily.  30 tablet  11  . lovastatin (MEVACOR) 40 MG tablet Take 2 tablets (80 mg total) by mouth at bedtime.  60 tablet  11   No current facility-administered medications for this visit.    BP 149/83  Pulse 77  Ht 5' 8.5" (1.74 m)  Wt 91.627 kg (202 lb)  BMI 30.26 kg/m2 General:  Well developed, well nourished, in no acute distress. Neck:  Neck supple, no JVD. No masses, thyromegaly or abnormal cervical nodes. Lungs:  Decreased breath sounds bilaterally.  Heart:  Non-displaced PMI, chest non-tender; regular rate and rhythm, S1, S2 without murmurs, rubs or gallops. Carotid upstroke normal, no bruit. Pedals normal pulses. No edema, no varicosities. Abdomen:  Bowel sounds positive; abdomen soft and non-tender without masses, organomegaly, or hernias noted. No hepatosplenomegaly. Extremities:  No clubbing or  cyanosis. Neurologic:  Alert and oriented x 3. Psych:  Normal affect.  Assessment/Plan: 1. CAD: No chest pain.  Continue ASA 81, statin, ACEI, metoprolol. 2. HTN: BP is running high. I am going to have him start HCTZ 25 mg daily with BMET in 2 wks.  He will check his BP daily and we will call to see what BP is running on HCTZ in 2 wks.  3. Hyperlipidemia: He is on Niaspan and lovastatin.  I do not think that Niaspan is likely to be helpful for him based on most recent data.  I will have him stop Niaspan and increase lovastatin to 80 mg daily.  Will get lipids in followup.  4. COPD: Per Dr. Para March.  He is going to have an appointment with him to talk about an inhaler regimen.   I will see him in 1 year  Marca Ancona 05/03/2013

## 2013-05-08 ENCOUNTER — Other Ambulatory Visit: Payer: Medicare Other

## 2013-05-13 ENCOUNTER — Other Ambulatory Visit: Payer: Self-pay | Admitting: Cardiology

## 2013-05-16 ENCOUNTER — Other Ambulatory Visit (INDEPENDENT_AMBULATORY_CARE_PROVIDER_SITE_OTHER): Payer: Medicare Other

## 2013-05-16 DIAGNOSIS — E7800 Pure hypercholesterolemia, unspecified: Secondary | ICD-10-CM

## 2013-05-16 DIAGNOSIS — I10 Essential (primary) hypertension: Secondary | ICD-10-CM

## 2013-05-16 DIAGNOSIS — E785 Hyperlipidemia, unspecified: Secondary | ICD-10-CM

## 2013-05-16 LAB — LIPID PANEL
Cholesterol: 152 mg/dL (ref 0–200)
HDL: 33.9 mg/dL — ABNORMAL LOW (ref >39.00)
VLDL: 81.6 mg/dL — ABNORMAL HIGH (ref 0.0–40.0)

## 2013-05-16 LAB — BASIC METABOLIC PANEL
BUN: 15 mg/dL (ref 6–23)
CO2: 31 mEq/L (ref 19–32)
Calcium: 9.1 mg/dL (ref 8.4–10.5)
Creatinine, Ser: 1.1 mg/dL (ref 0.4–1.5)
Glucose, Bld: 241 mg/dL — ABNORMAL HIGH (ref 70–99)
Sodium: 138 mEq/L (ref 135–145)

## 2013-05-16 LAB — LDL CHOLESTEROL, DIRECT: Direct LDL: 67.7 mg/dL

## 2013-05-20 ENCOUNTER — Telehealth: Payer: Self-pay | Admitting: *Deleted

## 2013-05-20 DIAGNOSIS — E785 Hyperlipidemia, unspecified: Secondary | ICD-10-CM

## 2013-05-20 MED ORDER — FENOFIBRATE 48 MG PO TABS: 48.0000 mg | ORAL_TABLET | Freq: Every day | ORAL | Status: DC

## 2013-05-20 NOTE — Progress Notes (Signed)
Pt advised, verbalized understanding. He will discuss elevated glucose with Dr Para March.

## 2013-05-20 NOTE — Telephone Encounter (Signed)
.   HTN: BP is running high. I am going to have him start HCTZ 25 mg daily with BMET in 2 wks. He will check his BP daily and we will call to see what BP is running on HCTZ in 2 wks.   05/20/13 Pt states BP has been in the 110-115/65-70 range, he denies any lightheadedness or dizziness.

## 2013-05-20 NOTE — Telephone Encounter (Signed)
That is fine 

## 2013-05-21 ENCOUNTER — Telehealth: Payer: Self-pay

## 2013-05-21 NOTE — Telephone Encounter (Signed)
Pt said Dr Shirlee Latch office suggested pt discuss BS 241. Pt is not taking any med for diabetes and pt said he feels OK no diabetic symptoms. Pt is going to be in [place] on 05/22/13 and will return tomorrow night; pt will decrease intake of sweets and starches. Pt scheduled appt with Dr Para March 05/23/13 at 8:15 am to discuss BS. If pt condition changes or worsens prior to appt pt will go to UC.

## 2013-05-22 NOTE — Telephone Encounter (Signed)
LM for pt BP is fine, no adjustments.

## 2013-05-22 NOTE — Telephone Encounter (Signed)
Agreed -

## 2013-05-23 ENCOUNTER — Ambulatory Visit (INDEPENDENT_AMBULATORY_CARE_PROVIDER_SITE_OTHER): Payer: Medicare Other | Admitting: Family Medicine

## 2013-05-23 ENCOUNTER — Encounter: Payer: Self-pay | Admitting: Family Medicine

## 2013-05-23 VITALS — BP 108/64 | HR 73 | Temp 97.5°F | Wt 201.2 lb

## 2013-05-23 DIAGNOSIS — E119 Type 2 diabetes mellitus without complications: Secondary | ICD-10-CM

## 2013-05-23 DIAGNOSIS — E1165 Type 2 diabetes mellitus with hyperglycemia: Secondary | ICD-10-CM

## 2013-05-23 DIAGNOSIS — J449 Chronic obstructive pulmonary disease, unspecified: Secondary | ICD-10-CM

## 2013-05-23 NOTE — Patient Instructions (Signed)
Drink water and eat a low carb diet.   Shirlee Limerick will call about your referral. Go to the lab on the way out.  We'll contact you with your lab report. We'll go from there.

## 2013-05-23 NOTE — Progress Notes (Signed)
Pre-visit discussion using our clinic review tool. No additional management support is needed unless otherwise documented below in the visit note.  Prev with elevated sugars after steroid injection.  Has been off prednisone for at least a month.  Fasting sugar was >200 last week.  FH DM2.  Potatoes, bread, tea and soda d/w pt.  DM2 path/phys d/w pt.   He is having to use SABA daily and we'll need to address this.  He has frequent cough episodes.  He does get relief from SABA.   Meds, vitals, and allergies reviewed.   ROS: See HPI.  Otherwise, noncontributory.  nad ncat Mmm rrr ctab except for scant wheeze abd soft, not ttp Ext w/o edema

## 2013-05-24 DIAGNOSIS — E1151 Type 2 diabetes mellitus with diabetic peripheral angiopathy without gangrene: Secondary | ICD-10-CM | POA: Insufficient documentation

## 2013-05-24 DIAGNOSIS — E1169 Type 2 diabetes mellitus with other specified complication: Secondary | ICD-10-CM | POA: Insufficient documentation

## 2013-05-24 MED ORDER — METFORMIN HCL 500 MG PO TABS: ORAL_TABLET | ORAL | Status: DC

## 2013-05-24 NOTE — Assessment & Plan Note (Signed)
I didn't want to start a steroid inhaler now given the DM2 and the continued response to albuterol. We'll have patient notify us when sugar is better controlled and we'll likely add an inhaled steroid.  Okay for outpatient f/u.

## 2013-05-24 NOTE — Assessment & Plan Note (Addendum)
Path/phys d/w pt.  rec low carb diet and plenty of water.  See notes on labs. Refer for teaching.  Start metformin, see lab note. Recheck in about 3 months.  New dx.

## 2013-06-03 ENCOUNTER — Other Ambulatory Visit: Payer: Self-pay | Admitting: Cardiology

## 2013-06-11 ENCOUNTER — Telehealth: Payer: Self-pay

## 2013-06-11 ENCOUNTER — Other Ambulatory Visit: Payer: Self-pay | Admitting: Family Medicine

## 2013-06-11 NOTE — Telephone Encounter (Signed)
Pt was seen 04/22/13 and said Dr Para March mentioned might change inhaler; pt has non prod cough that is irritating and wants to know if Dr Para March would give new inhaler; o wheezingSOB or CP. Also pt does not have appt with Dr Para March until 08/15/12 and pt feels good but wants to know how he can monitor BS to see if his diabetic meds are helping. Pt wants to know if should get glucose monitor. [facility] [address]. Pt request cb.

## 2013-06-12 MED ORDER — BUDESONIDE-FORMOTEROL FUMARATE 160-4.5 MCG/ACT IN AERO: 2.0000 | INHALATION_SPRAY | Freq: Two times a day (BID) | RESPIRATORY_TRACT | Status: DC

## 2013-06-12 MED ORDER — GLUCOSE BLOOD VI STRP: ORAL_STRIP | Status: DC

## 2013-06-12 MED ORDER — LANCETS MISC: Status: DC

## 2013-06-12 MED ORDER — BLOOD GLUCOSE METER KIT: 1.0000 | PACK | Freq: Every day | Status: DC | PRN

## 2013-06-12 NOTE — Telephone Encounter (Signed)
Change to symbicort, use 2 puffs BID.  Use albuterol only if needed, on top of the routine symbicort use.  Rinse after using symbicort. Notify us if not improved after about 10 days on the new inhaler, ie if not needing the albuterol less.  rx sent.  Please send in a generic rx for a DM2 meter with #100 strips and lancets. Check sugar daily PRN. Dx DM2. Thanks.

## 2013-06-12 NOTE — Telephone Encounter (Signed)
Received refill request electronically. Last office visit 05/23/13, last refill 04/17/13. Is it okay to refill medication?

## 2013-06-12 NOTE — Telephone Encounter (Signed)
Patient notified as instructed by telephone. Items sent to pharmacy as instructed.

## 2013-06-13 NOTE — Telephone Encounter (Signed)
Sent!

## 2013-06-20 ENCOUNTER — Telehealth: Payer: Self-pay | Admitting: *Deleted

## 2013-06-20 ENCOUNTER — Other Ambulatory Visit: Payer: Self-pay | Admitting: *Deleted

## 2013-06-20 MED ORDER — RELION LANCETS MICRO-THIN 33G MISC: Status: DC

## 2013-06-20 MED ORDER — GLUCOSE BLOOD VI STRP: ORAL_STRIP | Status: DC

## 2013-06-20 MED ORDER — RELION CONFIRM GLUCOSE MONITOR W/DEVICE KIT: PACK | Status: DC

## 2013-06-20 NOTE — Telephone Encounter (Signed)
Patient says that the inhaler that he was given at the last OV is very expensive and it will put him in the donut hole early in the year with this med alone, not to mention the other meds that he is on.  Patient is asking if there is a cheaper alternative?

## 2013-06-20 NOTE — Telephone Encounter (Signed)
He needs to check with his insurance for a list of cheaper steroid inhalers.  Let me know and we'll go from there.  Thanks.

## 2013-06-21 NOTE — Telephone Encounter (Signed)
Left message for pt giving instructions listed below.

## 2013-07-15 ENCOUNTER — Telehealth: Payer: Self-pay

## 2013-07-15 NOTE — Telephone Encounter (Signed)
Pt scheduled flu shot at flu shot clinic 07/16/13.

## 2013-07-16 ENCOUNTER — Ambulatory Visit (INDEPENDENT_AMBULATORY_CARE_PROVIDER_SITE_OTHER): Payer: Medicare Other

## 2013-07-16 DIAGNOSIS — Z23 Encounter for immunization: Secondary | ICD-10-CM | POA: Diagnosis not present

## 2013-07-17 DIAGNOSIS — E119 Type 2 diabetes mellitus without complications: Secondary | ICD-10-CM | POA: Diagnosis not present

## 2013-07-23 ENCOUNTER — Other Ambulatory Visit (INDEPENDENT_AMBULATORY_CARE_PROVIDER_SITE_OTHER): Payer: Medicare Other

## 2013-07-23 DIAGNOSIS — I10 Essential (primary) hypertension: Secondary | ICD-10-CM

## 2013-07-23 DIAGNOSIS — E785 Hyperlipidemia, unspecified: Secondary | ICD-10-CM

## 2013-07-23 DIAGNOSIS — IMO0001 Reserved for inherently not codable concepts without codable children: Secondary | ICD-10-CM | POA: Diagnosis not present

## 2013-07-23 DIAGNOSIS — E119 Type 2 diabetes mellitus without complications: Secondary | ICD-10-CM | POA: Diagnosis not present

## 2013-07-23 DIAGNOSIS — E1165 Type 2 diabetes mellitus with hyperglycemia: Secondary | ICD-10-CM

## 2013-07-23 DIAGNOSIS — IMO0002 Reserved for concepts with insufficient information to code with codable children: Secondary | ICD-10-CM

## 2013-07-23 LAB — HEPATIC FUNCTION PANEL
ALBUMIN: 3.9 g/dL (ref 3.5–5.2)
ALT: 40 U/L (ref 0–53)
AST: 29 U/L (ref 0–37)
Alkaline Phosphatase: 38 U/L — ABNORMAL LOW (ref 39–117)
Bilirubin, Direct: 0.1 mg/dL (ref 0.0–0.3)
TOTAL PROTEIN: 6.8 g/dL (ref 6.0–8.3)
Total Bilirubin: 0.8 mg/dL (ref 0.3–1.2)

## 2013-07-23 LAB — LIPID PANEL
CHOLESTEROL: 125 mg/dL (ref 0–200)
HDL: 29.3 mg/dL — ABNORMAL LOW (ref >39.00)
Total CHOL/HDL Ratio: 4
Triglycerides: 319 mg/dL — ABNORMAL HIGH (ref 0.0–149.0)
VLDL: 63.8 mg/dL — ABNORMAL HIGH (ref 0.0–40.0)

## 2013-07-23 LAB — HEMOGLOBIN A1C: Hgb A1c MFr Bld: 8.3 % — ABNORMAL HIGH (ref 4.6–6.5)

## 2013-07-23 LAB — LDL CHOLESTEROL, DIRECT: Direct LDL: 55.8 mg/dL

## 2013-07-24 DIAGNOSIS — E119 Type 2 diabetes mellitus without complications: Secondary | ICD-10-CM | POA: Diagnosis not present

## 2013-07-30 ENCOUNTER — Other Ambulatory Visit: Payer: Self-pay | Admitting: *Deleted

## 2013-07-30 MED ORDER — FENOFIBRATE 145 MG PO TABS: 145.0000 mg | ORAL_TABLET | Freq: Every day | ORAL | Status: DC

## 2013-08-15 ENCOUNTER — Encounter: Payer: Self-pay | Admitting: Family Medicine

## 2013-08-15 ENCOUNTER — Ambulatory Visit (INDEPENDENT_AMBULATORY_CARE_PROVIDER_SITE_OTHER): Payer: Medicare Other | Admitting: Family Medicine

## 2013-08-15 VITALS — BP 120/70 | HR 62 | Temp 98.1°F | Wt 200.5 lb

## 2013-08-15 DIAGNOSIS — E1165 Type 2 diabetes mellitus with hyperglycemia: Secondary | ICD-10-CM

## 2013-08-15 DIAGNOSIS — IMO0001 Reserved for inherently not codable concepts without codable children: Secondary | ICD-10-CM | POA: Diagnosis not present

## 2013-08-15 DIAGNOSIS — IMO0002 Reserved for concepts with insufficient information to code with codable children: Secondary | ICD-10-CM

## 2013-08-15 DIAGNOSIS — J449 Chronic obstructive pulmonary disease, unspecified: Secondary | ICD-10-CM | POA: Diagnosis not present

## 2013-08-15 NOTE — Assessment & Plan Note (Signed)
Improved, much less cough, ctab today.  Minimal SABA use.  He'll check prices for combination inhalers.  Cost was high on symbicort.

## 2013-08-15 NOTE — Patient Instructions (Addendum)
See if another combination inhaler (like advair) will be cheaper than symbicort.   Recheck A1c before a visit in about 3-4 months.  Keep working on your sugar.   Take care. Glad to see you.

## 2013-08-15 NOTE — Progress Notes (Signed)
Pre-visit discussion using our clinic review tool. No additional management support is needed unless otherwise documented below in the visit note.  Diabetes:  Using medications without difficulties: yes Hypoglycemic episodes:no Hyperglycemic episodes:rarely >200 Feet problems:no Blood Sugars averaging: usually 120-150 usually, he had a reading of 91 one morning.   Working much more on diet. A1c improved.  D/w pt.  Not at goal yet  COPD.  symbicort added, using in AM.  Still using SABA occ, but much less than prev.  He is coughing much less.    Meds, vitals, and allergies reviewed.   ROS: See HPI.  Otherwise negative.    GEN: nad, alert and oriented HEENT: mucous membranes moist NECK: supple w/o LA CV: rrr. PULM: ctab, no inc wob ABD: soft, +bs EXT: no edema SKIN: no acute rash  Diabetic foot exam: Normal inspection No skin breakdown No calluses  Normal DP pulses Normal sensation to light touch and monofilament Nails normal

## 2013-08-15 NOTE — Assessment & Plan Note (Signed)
Improved, but not yet controlled.  D/w pt.  He'll work on sugar and continue metformin.  A1c may lag his progress.  Recheck in a few months before a visit.  He agrees. Labs d/w pt.

## 2013-08-16 ENCOUNTER — Telehealth: Payer: Self-pay

## 2013-08-16 ENCOUNTER — Telehealth: Payer: Self-pay | Admitting: Family Medicine

## 2013-08-16 NOTE — Telephone Encounter (Signed)
Relevant patient education assigned to patient using Emmi. ° °

## 2013-08-21 DIAGNOSIS — E119 Type 2 diabetes mellitus without complications: Secondary | ICD-10-CM | POA: Diagnosis not present

## 2013-09-18 DIAGNOSIS — E119 Type 2 diabetes mellitus without complications: Secondary | ICD-10-CM | POA: Diagnosis not present

## 2013-11-15 DIAGNOSIS — IMO0001 Reserved for inherently not codable concepts without codable children: Secondary | ICD-10-CM | POA: Diagnosis not present

## 2013-11-15 DIAGNOSIS — E11329 Type 2 diabetes mellitus with mild nonproliferative diabetic retinopathy without macular edema: Secondary | ICD-10-CM | POA: Diagnosis not present

## 2013-11-21 ENCOUNTER — Other Ambulatory Visit: Payer: Self-pay | Admitting: Family Medicine

## 2013-11-21 DIAGNOSIS — E119 Type 2 diabetes mellitus without complications: Secondary | ICD-10-CM

## 2013-11-29 ENCOUNTER — Other Ambulatory Visit (INDEPENDENT_AMBULATORY_CARE_PROVIDER_SITE_OTHER): Payer: Medicare Other

## 2013-11-29 DIAGNOSIS — E119 Type 2 diabetes mellitus without complications: Secondary | ICD-10-CM | POA: Diagnosis not present

## 2013-11-29 LAB — HEMOGLOBIN A1C: HEMOGLOBIN A1C: 7.4 % — AB (ref 4.6–6.5)

## 2013-11-29 LAB — LIPID PANEL
Cholesterol: 163 mg/dL (ref 0–200)
HDL: 30.3 mg/dL — ABNORMAL LOW (ref >39.00)
LDL Cholesterol: 85 mg/dL (ref 0–99)
Total CHOL/HDL Ratio: 5
Triglycerides: 238 mg/dL — ABNORMAL HIGH (ref 0.0–149.0)
VLDL: 47.6 mg/dL — ABNORMAL HIGH (ref 0.0–40.0)

## 2013-11-29 LAB — GLUCOSE, RANDOM: Glucose, Bld: 123 mg/dL — ABNORMAL HIGH (ref 70–99)

## 2013-12-04 ENCOUNTER — Ambulatory Visit (INDEPENDENT_AMBULATORY_CARE_PROVIDER_SITE_OTHER): Payer: Medicare Other | Admitting: Family Medicine

## 2013-12-04 ENCOUNTER — Encounter: Payer: Self-pay | Admitting: Family Medicine

## 2013-12-04 VITALS — BP 122/74 | HR 60 | Temp 97.9°F | Wt 196.8 lb

## 2013-12-04 DIAGNOSIS — IMO0002 Reserved for concepts with insufficient information to code with codable children: Secondary | ICD-10-CM

## 2013-12-04 DIAGNOSIS — IMO0001 Reserved for inherently not codable concepts without codable children: Secondary | ICD-10-CM | POA: Diagnosis not present

## 2013-12-04 DIAGNOSIS — E1165 Type 2 diabetes mellitus with hyperglycemia: Secondary | ICD-10-CM

## 2013-12-04 NOTE — Patient Instructions (Signed)
Recheck labs before a visit in about 04/2014.  Keep working on your diet in the meantime. Thank you for your effort.

## 2013-12-04 NOTE — Assessment & Plan Note (Signed)
Improved, continue with work on diet and weight.  No change in meds.  TGs improved with sugar control.  D/w pt.   The atypical sensation on his back is noted, but no cause seen, he'll notify us prn.  No other neuro or derm sx to w/u at this point.

## 2013-12-04 NOTE — Progress Notes (Signed)
Pre visit review using our clinic review tool, if applicable. No additional management support is needed unless otherwise documented below in the visit note.  Diabetes:  Using medications without difficulties:yes Hypoglycemic episodes:only if prolonged fasting.   Hyperglycemic episodes:no Feet problems:no Blood Sugars averaging: 125-140 usually.   eye exam within last year: yes, 3 weeks ago.   He cut out a lot of carbs, as much as possible.   More veggies.  Weight is down, intentionally.   Change in sensation on the back.  Several inch wide area on right side of mid back feels "wet" per patient, going on for 1-2 months. No rash.  No visible skin changes.  It feels like it is on the skin, not deep in/under the skin.  No other neuro sx.  He'll monitor it.  Meds, vitals, and allergies reviewed.   ROS: See HPI.  Otherwise negative.    GEN: nad, alert and oriented HEENT: mucous membranes moist NECK: supple w/o LA CV: rrr. PULM: ctab, no inc wob ABD: soft, +bs EXT: no edema SKIN: no acute or chronic rash on back.  No skin lesions. Normal exam on the back.

## 2013-12-05 ENCOUNTER — Telehealth: Payer: Self-pay

## 2013-12-05 NOTE — Telephone Encounter (Signed)
Relevant patient education assigned to patient using Emmi. ° °

## 2014-01-29 ENCOUNTER — Telehealth: Payer: Self-pay

## 2014-01-29 DIAGNOSIS — M545 Low back pain: Secondary | ICD-10-CM

## 2014-01-29 NOTE — Telephone Encounter (Signed)
Agree, we should get the plain films first.  I put in the order.  Xray visit. Not an OV.  We'll go from there.  Thanks.

## 2014-01-29 NOTE — Telephone Encounter (Signed)
See what info you can get.  I've got patient scheduled from now until 7PM.  Thanks.

## 2014-01-29 NOTE — Telephone Encounter (Signed)
Pt was seen 12/04/13;area rt side of mid back felt "wet" with no noticeable skin changes. Pt said progressively area of rt mid back has worsened.  now 90 % of the time pt has dull constant ache in rt mid back. Pt said it is difficult to describe and just wants to talk with Dr Para Marchuncan. Pt request cb by Dr Para Marchuncan at [phone].

## 2014-01-29 NOTE — Telephone Encounter (Signed)
Patient notified as instructed by telephone. 

## 2014-01-29 NOTE — Telephone Encounter (Signed)
Pt has same wet sensation on outside of skin on rt mid back that pt had when seen 12/04/13;when pt checks his shirt there is no wetness on the shirt. When pt was seen in May did not have pain associated to area but not 90 % of the time pt has dull constant ache size of a golf ball in rt mid back.Pt said his wife has looked at area and there are no noticeable skin changes; no redness,no swelling,no rash or blistering noted.No lumps or knots seen or felt.No urinary symptoms such as pain or burning upon urination or frequency of urine.No fever,no numbess and pain does not radiate to another part of body.No problem walking and no problems with hands gripping or strength. Pt has not had any injury to the area; Pt is concerned something going on inside and wants to know if could get xray. Pt request cb rather than another appt.Please advise.

## 2014-01-30 ENCOUNTER — Telehealth: Payer: Self-pay | Admitting: Radiology

## 2014-01-30 ENCOUNTER — Ambulatory Visit (INDEPENDENT_AMBULATORY_CARE_PROVIDER_SITE_OTHER)
Admission: RE | Admit: 2014-01-30 | Discharge: 2014-01-30 | Disposition: A | Discharge status: Home / Self Care | Payer: Medicare Other | Source: Ambulatory Visit | Attending: Family Medicine | Admitting: Family Medicine

## 2014-01-30 DIAGNOSIS — M545 Low back pain, unspecified: Secondary | ICD-10-CM

## 2014-01-30 DIAGNOSIS — M4310 Spondylolisthesis, site unspecified: Secondary | ICD-10-CM | POA: Diagnosis not present

## 2014-01-30 DIAGNOSIS — M5137 Other intervertebral disc degeneration, lumbosacral region: Secondary | ICD-10-CM | POA: Diagnosis not present

## 2014-01-30 NOTE — Telephone Encounter (Signed)
Patient advised.   Patient says it really is not that bad, he just wanted to be sure there was not a tumor/cancer in there causing his pain.  So unless it gets considerably worse, he doesn't want to do anything more right now.

## 2014-01-30 NOTE — Telephone Encounter (Signed)
It is not possible to be directly available to all patient calls.  I would like to be, but it can't be done.  There isn't any information that he can give me that he can't give a CMA since we work together.   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  About his films- Diffuse degenerative disc disease, greatest at L4-5 and L5-S1. This could be the cause.  We can try getting him over to ortho or a trial of PT (or both).  Let me know which way he wants to go and we'll set it up.   It would be reasonable to use ibuprofen with food in the meantime.   Thanks.

## 2014-01-30 NOTE — Telephone Encounter (Signed)
When pt came in for his x ray, he said he spoke 3 times to your CMA, and wanted to speak to you. Complained he can never get you on the phone when he needs to talk to you. Request call back

## 2014-01-31 NOTE — Telephone Encounter (Signed)
Noted, thanks!

## 2014-02-03 ENCOUNTER — Telehealth: Payer: Self-pay | Admitting: Cardiology

## 2014-02-03 NOTE — Telephone Encounter (Signed)
Hold HCTZ for now, check BP daily and restart at 12.5 mg daily if BP increases to > 140/90.  Sounds like a GI problem, would have him see his PCP for evaluation.

## 2014-02-03 NOTE — Telephone Encounter (Signed)
New message           Pt bp 110/60 / pt energy level is low

## 2014-02-03 NOTE — Telephone Encounter (Signed)
Pt advised,verbalized understanding. 

## 2014-02-03 NOTE — Telephone Encounter (Signed)
Pt states his BP seems to be on a downward trend. Recently it has been in the 106-110/56-60 range.  He does take HCTZ 25mg  daily. He feels his energy level has decreased from an 8 to a 3.  He has constant nausea for about 2 weeks the is worse when he eats something.   I will forward to Dr Shirlee LatchMcLean for review.

## 2014-02-04 ENCOUNTER — Other Ambulatory Visit: Payer: Self-pay | Admitting: Cardiology

## 2014-04-10 ENCOUNTER — Other Ambulatory Visit: Payer: Self-pay | Admitting: Family Medicine

## 2014-04-10 DIAGNOSIS — E1165 Type 2 diabetes mellitus with hyperglycemia: Secondary | ICD-10-CM

## 2014-04-10 DIAGNOSIS — IMO0002 Reserved for concepts with insufficient information to code with codable children: Secondary | ICD-10-CM

## 2014-04-14 ENCOUNTER — Other Ambulatory Visit (INDEPENDENT_AMBULATORY_CARE_PROVIDER_SITE_OTHER): Payer: Medicare Other

## 2014-04-14 DIAGNOSIS — IMO0002 Reserved for concepts with insufficient information to code with codable children: Secondary | ICD-10-CM

## 2014-04-14 DIAGNOSIS — E1165 Type 2 diabetes mellitus with hyperglycemia: Secondary | ICD-10-CM | POA: Diagnosis not present

## 2014-04-14 LAB — HEMOGLOBIN A1C: Hgb A1c MFr Bld: 7.2 % — ABNORMAL HIGH (ref 4.6–6.5)

## 2014-04-21 ENCOUNTER — Encounter: Payer: Self-pay | Admitting: Family Medicine

## 2014-04-21 ENCOUNTER — Ambulatory Visit (INDEPENDENT_AMBULATORY_CARE_PROVIDER_SITE_OTHER): Payer: Medicare Other | Admitting: Family Medicine

## 2014-04-21 VITALS — BP 126/78 | HR 60 | Temp 98.1°F | Resp 14 | Wt 200.5 lb

## 2014-04-21 DIAGNOSIS — E1165 Type 2 diabetes mellitus with hyperglycemia: Secondary | ICD-10-CM | POA: Diagnosis not present

## 2014-04-21 DIAGNOSIS — Z23 Encounter for immunization: Secondary | ICD-10-CM

## 2014-04-21 DIAGNOSIS — IMO0002 Reserved for concepts with insufficient information to code with codable children: Secondary | ICD-10-CM

## 2014-04-21 MED ORDER — NITROGLYCERIN 0.4 MG SL SUBL: 0.4000 mg | SUBLINGUAL_TABLET | SUBLINGUAL | Status: DC | PRN

## 2014-04-21 MED ORDER — BUDESONIDE-FORMOTEROL FUMARATE 160-4.5 MCG/ACT IN AERO: 2.0000 | INHALATION_SPRAY | RESPIRATORY_TRACT | Status: DC

## 2014-04-21 NOTE — Assessment & Plan Note (Signed)
Now nearly controlled, improved from prev.  Continue work on diet and weight.  Recheck in 6 months.

## 2014-04-21 NOTE — Patient Instructions (Signed)
Keep working on your sugar and recheck at physical in about 6 months.  Take care.  Glad to see you.

## 2014-04-21 NOTE — Progress Notes (Signed)
Pre visit review using our clinic review tool, if applicable. No additional management support is needed unless otherwise documented below in the visit note.  Diabetes:  Using medications without difficulties:yes Hypoglycemic episodes:no Hyperglycemic episodes:no Feet problems:no Blood Sugars averaging: 100-140 eye exam within last year: yes, done early 2015, ~08/2013 per patient  Meds, vitals, and allergies reviewed.   ROS: See HPI.  Otherwise negative.    GEN: nad, alert and oriented HEENT: mucous membranes moist NECK: supple w/o LA CV: rrr. PULM: ctab, no inc wob ABD: soft, +bs EXT: no edema SKIN: no acute rash  Diabetic foot exam: Normal inspection No skin breakdown No calluses  Normal DP pulses Normal sensation to light touch and monofilament Nails normal

## 2014-04-25 ENCOUNTER — Encounter: Payer: Self-pay | Admitting: Cardiology

## 2014-04-25 ENCOUNTER — Other Ambulatory Visit: Payer: Self-pay | Admitting: Cardiology

## 2014-04-25 ENCOUNTER — Ambulatory Visit (INDEPENDENT_AMBULATORY_CARE_PROVIDER_SITE_OTHER): Payer: Medicare Other | Admitting: Cardiology

## 2014-04-25 VITALS — BP 124/70 | HR 68 | Ht 68.5 in | Wt 199.0 lb

## 2014-04-25 DIAGNOSIS — I2510 Atherosclerotic heart disease of native coronary artery without angina pectoris: Secondary | ICD-10-CM

## 2014-04-25 DIAGNOSIS — I10 Essential (primary) hypertension: Secondary | ICD-10-CM | POA: Diagnosis not present

## 2014-04-25 DIAGNOSIS — E785 Hyperlipidemia, unspecified: Secondary | ICD-10-CM

## 2014-04-25 NOTE — Patient Instructions (Addendum)
Your physician wants you to follow-up in: 1 year with Dr McLean. (October 2016).You will receive a reminder letter in the mail two months in advance. If you don't receive a letter, please call our office to schedule the follow-up appointment.  

## 2014-04-26 ENCOUNTER — Other Ambulatory Visit: Payer: Self-pay | Admitting: *Deleted

## 2014-04-27 NOTE — Progress Notes (Signed)
Patient ID: [name], male   DOB: [birth year], 68 y.o.   MRN: [id number] PCP: Dr. Damita Dunnings  68 yo with history of CAD s/p LAD and RCA PCIs presents for followup.  He has not had any chest pain.  No exertional dyspnea.  BP is controlled.  Weight is down 3 lbs from last appointment. He has been diagnosed with diabetes and is watching his diet better.  He is now on metformin.   ECG: NSR, anterolateral T wave flattening  Labs (7/11): LDL 76, HDl 35, K 4.6, creatinine 1.1 Labs (6/12): LDL 59, HDL 29 Labs (7/12): K 4.2, creatinine 0.96 Labs (6/13): K 3.8, creatinine 1.09 Labs (7/13): LDL 76, HDL 35 Labs (11/14): K 4.5, creatinine 1.1 Labs (5/15): LDL 85, HDL 30, TGs 238  Allergies (verified):  No Known Drug Allergies  Past Medical History: 1. Coronary artery disease.  The patient had a myocardial infarction in 1999 and had a stent placed in his LAD.  He had an inferior MI in 2005 and had a drug-eluting stent placed in his RCA.  His most recent functional study was Myoview in June 2007 with an EF of 61% and no evidence for ischemia. 2. Hypercholesterolemia. 3. Osteoarthritis of the hip and the knee. 4. COPD 5. Type II diabetes  Family History: Father: DECEASED 73;  CHF Mother: DECEASED 77: OLD AGE  Social History: The patient works in [employer] for a [employer].  He is a past smoker.  He lives in [place].  ROS: All systems reviewed and negative except as per HPI.   Current Outpatient Prescriptions  Medication Sig Dispense Refill  . albuterol (PROAIR HFA) 108 (90 BASE) MCG/ACT inhaler Inhale 2 puffs into the lungs 3 (three) times daily as needed (cough).  18 g  2  . benzonatate (TESSALON) 200 MG capsule TAKE ONE CAPSULE BY MOUTH THREE TIMES DAILY AS NEEDED FOR COUGH  30 capsule  0  . Blood Glucose Monitoring Suppl (RELION CONFIRM GLUCOSE MONITOR) W/DEVICE KIT Check blood sugar once daily as needed.  Diagnosis:  250.00  Non-insulin dependent  1 kit  0  .  budesonide-formoterol (SYMBICORT) 160-4.5 MCG/ACT inhaler Inhale 2 puffs into the lungs every morning.  1 Inhaler  12  . fenofibrate (TRICOR) 145 MG tablet Take 1 tablet (145 mg total) by mouth daily.  30 tablet  6  . Fish Oil OIL Take 1 capsule by mouth 2 (two) times daily.        . fluticasone (FLONASE) 50 MCG/ACT nasal spray Place 2 sprays into the nose daily.      Marland Kitchen GLUCOSAMINE-CHONDROITIN PO Take 1 tablet by mouth 2 (two) times daily.        Marland Kitchen glucose blood (RELION CONFIRM/MICRO TEST) test strip Use as instructed to test blood sugar once daily as needed.  Diagnosis:  250.00  Non insulin-dependent.  100 each  3  . HYDROcodone-acetaminophen (NORCO/VICODIN) 5-325 MG per tablet       . lisinopril (PRINIVIL,ZESTRIL) 40 MG tablet Take 1 tablet (40 mg total) by mouth daily.  30 tablet  10  . lovastatin (MEVACOR) 40 MG tablet Take 2 tablets (80 mg total) by mouth at bedtime.  60 tablet  11  . meclizine (ANTIVERT) 25 MG tablet Take 25 mg by mouth every 6 (six) hours as needed. For dizziness/vertigo      . metFORMIN (GLUCOPHAGE) 500 MG tablet Take 1,000 mg by mouth 2 (two) times daily with a meal.      . nitroGLYCERIN (  NITROSTAT) 0.4 MG SL tablet Place 1 tablet (0.4 mg total) under the tongue every 5 (five) minutes as needed. For chest pain  25 tablet  12  . RELION LANCETS MICRO-THIN 33G MISC Use to test blood sugar once daily as needed.  Diagnosis:  250.00   Non insulin dependent.  100 each  3  . aspirin EC 81 MG EC tablet Take 1 tablet (81 mg total) by mouth 2 (two) times daily.  30 tablet  0  . metoprolol tartrate (LOPRESSOR) 25 MG tablet TAKE ONE TABLET BY MOUTH TWICE DAILY  60 tablet  12   No current facility-administered medications for this visit.    BP 124/70  Pulse 68  Ht 5' 8.5" (1.74 m)  Wt 199 lb (90.266 kg)  BMI 29.81 kg/m2 General:  Well developed, well nourished, in no acute distress. Neck:  Neck supple, no JVD. No masses, thyromegaly or abnormal cervical nodes. Lungs:  Decreased  breath sounds bilaterally.  Heart:  Non-displaced PMI, chest non-tender; regular rate and rhythm, S1, S2 without murmurs, rubs or gallops. Carotid upstroke normal, no bruit. Pedals normal pulses. No edema, no varicosities. Abdomen:  Bowel sounds positive; abdomen soft and non-tender without masses, organomegaly, or hernias noted. No hepatosplenomegaly. Extremities:  No clubbing or cyanosis. Neurologic:  Alert and oriented x 3. Psych:  Normal affect.  Assessment/Plan: 1. CAD: No chest pain.  Continue ASA 81, statin, ACEI, metoprolol. 2. HTN: BP is controlled on current regimen.  3. Hyperlipidemia: Reasonable LDL in 5/15, continue lovastatin.  If LDL rises any at next check, would have low threshold to transition him onto atorvastatin.  4. COPD: Continues to use Symbicort.  Stable, no wheezing currently.  I will see him in 1 year  Loralie Champagne 04/27/2014

## 2014-05-19 ENCOUNTER — Telehealth: Payer: Self-pay | Admitting: Family Medicine

## 2014-05-19 NOTE — Telephone Encounter (Signed)
Noted, if he is lightheaded at all then skip his lisinopril until he feels better.  Thanks.

## 2014-05-19 NOTE — Telephone Encounter (Signed)
Patient Information:  Caller Name: Faylene MillionVance  Phone: [phone]  Patient: [name], [name]  Gender: Male  DOB: 07/23/1945  Age: 68 Years  PCP: Crawford Givensuncan, Graham Clelia Croft(Shaw) [facility](Family Practice)  Office Follow Up:  Does the office need to follow up with this patient?: Yes  Instructions For The Office: Coud u please call him if there are any cancellations today.   Symptoms  Reason For Call & Symptoms: Pt  has left lower abdominal pain. Onset Sun 05/18/14. Pt has had loose stools x 10 days. Pt is having  5-6 stools/day. He is afeb. He is drinking fluids and urinating.   Reviewed Health History In EMR: Yes  Reviewed Medications In EMR: Yes  Reviewed Allergies In EMR: Yes  Reviewed Surgeries / Procedures: Yes  Date of Onset of Symptoms: 05/18/2014  Guideline(s) Used:  Abdominal Pain - Male  Disposition Per Guideline:   See Today in Office  Reason For Disposition Reached:   Age > 60 years  Advice Given:  N/A  Patient Will Follow Care Advice: Appts are fully booked today. RN offered another location. Pt refused. He only wants to be seen at this office and asked to be scheduled tomorrow.  Appointment Scheduled:  05/20/2014 10:30:00 Appointment Scheduled Provider:  Nicki ReaperBaity, Regina

## 2014-05-19 NOTE — Telephone Encounter (Signed)
Patient advised.

## 2014-05-20 ENCOUNTER — Encounter: Payer: Self-pay | Admitting: Internal Medicine

## 2014-05-20 ENCOUNTER — Ambulatory Visit (INDEPENDENT_AMBULATORY_CARE_PROVIDER_SITE_OTHER): Payer: Medicare Other | Admitting: Internal Medicine

## 2014-05-20 VITALS — BP 128/74 | HR 56 | Temp 97.7°F | Wt 197.0 lb

## 2014-05-20 DIAGNOSIS — I2510 Atherosclerotic heart disease of native coronary artery without angina pectoris: Secondary | ICD-10-CM | POA: Diagnosis not present

## 2014-05-20 DIAGNOSIS — R1032 Left lower quadrant pain: Secondary | ICD-10-CM | POA: Diagnosis not present

## 2014-05-20 DIAGNOSIS — R197 Diarrhea, unspecified: Secondary | ICD-10-CM

## 2014-05-20 NOTE — Progress Notes (Signed)
Subjective:    Patient ID: [name], male    DOB: January 23, 1946, 68 y.o.   MRN: [id number]  HPI  Pt presents to the clinic today with c/o abdominal pain and diarrhea. He reports this started about 4 days ago. The pain was in the left groin abdomen. He reports that it was a sharp pain, worse with coughing. The pain was really bad over the weekend but reports it has improved. His was having up to 5 loose stools per day but his stool has been normal in color and consistency the last 2 days. He has not noticed any blood in his stool. He denies nausea, vomiting, fever or chills. He has not had sick contacts that he is aware of. He has not tried anything OTC.  Review of Systems      Past Medical History  Diagnosis Date  . CAD (coronary artery disease)     Pt had a MI in 1999 and had a stent placed in LAD. Had an inferior MI in 2005 and had a drug-eluting stent placed in his RCA. Most recent functional study was Myoview in June 2007 with an EF of 61% and no evidence for ischemia  . Hypercholesterolemia   . Osteoarthritis of hip   . Osteoarthritis, knee   . Tobacco abuse     Prior  . Impaired fasting glucose     Elevated after steroid injection  . COPD (chronic obstructive pulmonary disease)   . HTN (hypertension)   . Myocardial infarction   . Shortness of breath   . H/O hiatal hernia   . Peri-rectal abscess 12/27/2011    Current Outpatient Prescriptions  Medication Sig Dispense Refill  . albuterol (PROAIR HFA) 108 (90 BASE) MCG/ACT inhaler Inhale 2 puffs into the lungs 3 (three) times daily as needed (cough). 18 g 2  . Blood Glucose Monitoring Suppl (RELION CONFIRM GLUCOSE MONITOR) W/DEVICE KIT Check blood sugar once daily as needed.  Diagnosis:  250.00  Non-insulin dependent 1 kit 0  . budesonide-formoterol (SYMBICORT) 160-4.5 MCG/ACT inhaler Inhale 2 puffs into the lungs every morning. 1 Inhaler 12  . fenofibrate (TRICOR) 145 MG tablet Take 1 tablet (145 mg total) by mouth daily.  30 tablet 6  . Fish Oil OIL Take 1 capsule by mouth 2 (two) times daily.      . fluticasone (FLONASE) 50 MCG/ACT nasal spray Place 2 sprays into the nose daily.    Marland Kitchen GLUCOSAMINE-CHONDROITIN PO Take 1 tablet by mouth 2 (two) times daily.      Marland Kitchen glucose blood (RELION CONFIRM/MICRO TEST) test strip Use as instructed to test blood sugar once daily as needed.  Diagnosis:  250.00  Non insulin-dependent. 100 each 3  . HYDROcodone-acetaminophen (NORCO/VICODIN) 5-325 MG per tablet     . lisinopril (PRINIVIL,ZESTRIL) 40 MG tablet Take 1 tablet (40 mg total) by mouth daily. 30 tablet 10  . lovastatin (MEVACOR) 40 MG tablet Take 2 tablets (80 mg total) by mouth at bedtime. 60 tablet 11  . meclizine (ANTIVERT) 25 MG tablet Take 25 mg by mouth every 6 (six) hours as needed. For dizziness/vertigo    . metFORMIN (GLUCOPHAGE) 500 MG tablet Take 1,000 mg by mouth 2 (two) times daily with a meal.    . metoprolol tartrate (LOPRESSOR) 25 MG tablet TAKE ONE TABLET BY MOUTH TWICE DAILY 60 tablet 12  . nitroGLYCERIN (NITROSTAT) 0.4 MG SL tablet Place 1 tablet (0.4 mg total) under the tongue every 5 (five) minutes as needed. For chest  pain 25 tablet 12  . RELION LANCETS MICRO-THIN 33G MISC Use to test blood sugar once daily as needed.  Diagnosis:  250.00   Non insulin dependent. 100 each 3  . aspirin EC 81 MG EC tablet Take 1 tablet (81 mg total) by mouth 2 (two) times daily. 30 tablet 0   No current facility-administered medications for this visit.    Allergies  Allergen Reactions  . Augmentin [Amoxicillin-Pot Clavulanate] Other (See Comments)    Nausea,vomiting,diarrhea    Family History  Problem Relation Age of Onset  . Heart failure Father     CHF    History   Social History  . Marital Status: Married    Spouse Name: N/A    Number of Children: 2  . Years of Education: N/A   Occupational History  . 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 Smoking status: Former Smoker  -- 1.00 packs/day for 40 years    Types: Cigarettes    Quit date: 10/21/1998  . Smokeless tobacco: Never Used     Comment: Past smoker, states he will smoke maybe 1 cigarette/ month or so still  . Alcohol Use: 0.0 oz/week    0 Not specified per week     Comment: Occasional beer on the weekends  . Drug Use: No  . Sexual Activity: Not on file   Other Topics Concern  . Not on file   Social History Narrative   Lives in [place]   Married 30 years   2 grown daughters, 7 grandchildren, 1 great grandchild      Designated Party Release Form signed on 01/19/10 appointing Gerhard Munch     Constitutional: Denies fever, malaise, fatigue, headache or abrupt weight changes.  Respiratory: Denies difficulty breathing, shortness of breath, cough or sputum production.   Cardiovascular: Denies chest pain, chest tightness, palpitations or swelling in the hands or feet.  Gastrointestinal: Pt reports diarrhea. Denies abdominal pain, bloating, constipation, or blood in the stool.  GU: Denies urgency, frequency, pain with urination, burning sensation, blood in urine, odor or discharge. Musculoskeletal: Pt reports left groin pain. Denies decrease in range of motion, difficulty with gait, muscle pain or joint pain and swelling.    No other specific complaints in a complete review of systems (except as listed in HPI above).  Objective:   Physical Exam   BP 128/74 mmHg  Pulse 56  Temp(Src) 97.7 F (36.5 C) (Oral)  Wt 197 lb (89.359 kg)  SpO2 98% Wt Readings from Last 3 Encounters:  05/20/14 197 lb (89.359 kg)  04/25/14 199 lb (90.266 kg)  04/21/14 200 lb 8 oz (90.946 kg)    General: Appears his stated age, obese but well developed, well nourished in NAD. Cardiovascular: Normal rate and rhythm. S1,S2 noted.  No murmur, rubs or gallops noted.  Pulmonary/Chest: Normal effort and positive vesicular breath sounds. No respiratory distress. No wheezes, rales or ronchi noted.  Abdomen: Soft and  nontender. Normal bowel sounds, no bruits noted. No distention or masses noted. Liver, spleen and kidneys non palpable. No direct or indirect inguinal hernia noted.   BMET    Component Value Date/Time   NA 138 05/16/2013 0758   K 4.5 05/16/2013 0758   CL 103 05/16/2013 0758   CO2 31 05/16/2013 0758   GLUCOSE 123* 11/29/2013 0915   BUN 15 05/16/2013 0758   CREATININE 1.1 05/16/2013 0758   CREATININE 0.96 01/28/2011 0855   CALCIUM 9.1 05/16/2013 0758  GFRNONAA 69* 12/28/2011 1646   GFRAA 80* 12/28/2011 1646    Lipid Panel     Component Value Date/Time   CHOL 163 11/29/2013 0915   TRIG 238.0* 11/29/2013 0915   HDL 30.30* 11/29/2013 0915   HDL 35* 01/19/2012 0814   CHOLHDL 5 11/29/2013 0915   CHOLHDL 4.4 01/19/2012 0814   VLDL 47.6* 11/29/2013 0915   LDLCALC 85 11/29/2013 0915   LDLCALC 76 01/19/2012 0814    CBC    Component Value Date/Time   WBC 12.9* 12/30/2011 0645   RBC 4.69 12/30/2011 0645   HGB 15.5 12/30/2011 0645   HCT 43.3 12/30/2011 0645   PLT 169 12/30/2011 0645   MCV 92.3 12/30/2011 0645   MCH 33.0 12/30/2011 0645   MCHC 35.8 12/30/2011 0645   RDW 13.6 12/30/2011 0645   LYMPHSABS 1.1 12/28/2011 1646   MONOABS 1.5* 12/28/2011 1646   EOSABS 0.0 12/28/2011 1646   BASOSABS 0.0 12/28/2011 1646    Hgb A1C Lab Results  Component Value Date   HGBA1C 7.2* 04/14/2014        Assessment & Plan:   Groin pain, left with diarrhea:  Pain and diarrhea have resolved Exam normal Do not think we need to do any additional workup at this time Pt advised to watch for reoccurring or worsening symptoms and if that occurs to let me know  RTC as needed

## 2014-05-20 NOTE — Progress Notes (Signed)
Pre visit review using our clinic review tool, if applicable. No additional management support is needed unless otherwise documented below in the visit note. 

## 2014-05-20 NOTE — Patient Instructions (Signed)

## 2014-05-25 ENCOUNTER — Other Ambulatory Visit: Payer: Self-pay | Admitting: Cardiology

## 2014-06-17 ENCOUNTER — Other Ambulatory Visit: Payer: Self-pay | Admitting: Cardiology

## 2014-06-19 ENCOUNTER — Other Ambulatory Visit: Payer: Self-pay | Admitting: Cardiology

## 2014-07-07 ENCOUNTER — Encounter: Payer: Self-pay | Admitting: Family Medicine

## 2014-07-07 ENCOUNTER — Ambulatory Visit (INDEPENDENT_AMBULATORY_CARE_PROVIDER_SITE_OTHER): Payer: Medicare Other | Admitting: Family Medicine

## 2014-07-07 VITALS — BP 102/60 | HR 68 | Temp 97.4°F | Wt 194.5 lb

## 2014-07-07 DIAGNOSIS — R05 Cough: Secondary | ICD-10-CM | POA: Diagnosis not present

## 2014-07-07 DIAGNOSIS — I2510 Atherosclerotic heart disease of native coronary artery without angina pectoris: Secondary | ICD-10-CM | POA: Diagnosis not present

## 2014-07-07 DIAGNOSIS — R059 Cough, unspecified: Secondary | ICD-10-CM

## 2014-07-07 MED ORDER — DOXYCYCLINE HYCLATE 100 MG PO TABS: 100.0000 mg | ORAL_TABLET | Freq: Two times a day (BID) | ORAL | Status: DC

## 2014-07-07 MED ORDER — PREDNISONE 20 MG PO TABS: ORAL_TABLET | ORAL | Status: DC

## 2014-07-07 MED ORDER — BENZONATATE 200 MG PO CAPS: 200.0000 mg | ORAL_CAPSULE | Freq: Three times a day (TID) | ORAL | Status: DC | PRN

## 2014-07-07 NOTE — Progress Notes (Signed)
Pre visit review using our clinic review tool, if applicable. No additional management support is needed unless otherwise documented below in the visit note.  Started about 1.5 weeks ago, was initially getting better, then worsened again around Phelanxmas.  Post nasal gtt.  Fevers prev, not now.  Not much sputum.  Wheezing more.  Off symbicort, using SABA daily now.  Has been slightly more SOB recently.    Meds, vitals, and allergies reviewed.   ROS: See HPI.  Otherwise, noncontributory.  GEN: nad, alert and oriented HEENT: mucous membranes moist, tm w/o erythema, nasal exam w/o erythema, clear discharge noted,  OP with cobblestoning NECK: supple w/o LA CV: rrr.   PULM: no inc wob but diffuse mild wheezes noted B. No focal dec in BS.  Dry cough noted throughout the exam.  EXT: no edema SKIN: no acute rash

## 2014-07-07 NOTE — Patient Instructions (Signed)
Start back on symbicort, use albuterol if needed.  Start doxycycline in the meantime.  If you are still wheezing, then start the prednisone and take it with food.  Take tessalon as needed for the cough.  Take care.  Glad to see you.

## 2014-07-08 NOTE — Assessment & Plan Note (Signed)
Nontoxic.  Start back on symbicort, use albuterol if needed.  Start doxycycline in the meantime.  If still wheezing, then start the prednisone and take it with food.  Take tessalon as needed for the cough.  D/w pt.  F/u prn.

## 2014-07-11 HISTORY — PX: FEMORAL-POPLITEAL BYPASS GRAFT: SHX937

## 2014-07-23 ENCOUNTER — Other Ambulatory Visit: Payer: Self-pay | Admitting: Family Medicine

## 2014-08-01 ENCOUNTER — Other Ambulatory Visit: Payer: Self-pay | Admitting: Cardiology

## 2014-08-05 ENCOUNTER — Other Ambulatory Visit: Payer: Self-pay | Admitting: *Deleted

## 2014-08-05 ENCOUNTER — Telehealth: Payer: Self-pay | Admitting: Cardiology

## 2014-08-05 MED ORDER — FENOFIBRATE 145 MG PO TABS: 145.0000 mg | ORAL_TABLET | Freq: Every day | ORAL | Status: DC

## 2014-08-05 NOTE — Telephone Encounter (Signed)
OK, refill.

## 2014-08-05 NOTE — Telephone Encounter (Signed)
New Message         Pt calling stating that he can't remember is Dr. Shirlee LatchMCLean wanted him to continue taking Finofibrate or to stop taking it. Please call back and advise.

## 2014-08-05 NOTE — Telephone Encounter (Signed)
Spoke with pt and informed him that when looking over the last office visit that his Fenofibrate is still showing on his medication list and that I did not see an order for him to discontinue this medication.   Pt was last seen by Dr. Shirlee LatchMcLean in October 2015.  Pt states that he has not been taking this medication since shortly after his appt because he forgot to have it refilled. I told pt [facility] [place]. I informed pt that I would forward this message to Dr. Shirlee LatchMcLean and his nurse, Katina DungAnne Lankford, RN to make them aware that he had not been taking this medication. Pt verbalized understanding and was in agreement with this plan.

## 2014-08-21 ENCOUNTER — Encounter: Payer: Self-pay | Admitting: Family Medicine

## 2014-08-21 ENCOUNTER — Ambulatory Visit (INDEPENDENT_AMBULATORY_CARE_PROVIDER_SITE_OTHER): Payer: Medicare Other | Admitting: Family Medicine

## 2014-08-21 VITALS — BP 128/70 | HR 82 | Temp 98.2°F | Wt 199.5 lb

## 2014-08-21 DIAGNOSIS — J441 Chronic obstructive pulmonary disease with (acute) exacerbation: Secondary | ICD-10-CM | POA: Diagnosis not present

## 2014-08-21 MED ORDER — ALBUTEROL SULFATE (2.5 MG/3ML) 0.083% IN NEBU
2.5000 mg | INHALATION_SOLUTION | Freq: Once | RESPIRATORY_TRACT | Status: AC
  Administered 2014-08-21: 2.5 mg via RESPIRATORY_TRACT

## 2014-08-21 MED ORDER — IPRATROPIUM BROMIDE 0.02 % IN SOLN
0.5000 mg | Freq: Once | RESPIRATORY_TRACT | Status: AC
  Administered 2014-08-21: 0.5 mg via RESPIRATORY_TRACT

## 2014-08-21 MED ORDER — DOXYCYCLINE HYCLATE 100 MG PO CAPS: 100.0000 mg | ORAL_CAPSULE | Freq: Two times a day (BID) | ORAL | Status: DC

## 2014-08-21 MED ORDER — PREDNISONE 20 MG PO TABS: ORAL_TABLET | ORAL | Status: DC

## 2014-08-21 NOTE — Progress Notes (Signed)
Pre visit review using our clinic review tool, if applicable. No additional management support is needed unless otherwise documented below in the visit note. 

## 2014-08-21 NOTE — Addendum Note (Signed)
Addended by: Josph MachoANCE, KIMBERLY A on: 08/21/2014 06:43 PM   Modules accepted: Orders

## 2014-08-21 NOTE — Progress Notes (Signed)
BP 128/70 mmHg  Pulse 82  Temp(Src) 98.2 F (36.8 C) (Oral)  Wt 199 lb 8 oz (90.493 kg)  SpO2 94%   CC: COPD exac  Subjective:    Patient ID: [name], male    DOB: [birth year], 69 y.o.   MRN: [id number]  HPI: [name] is a 69 y.o. male presenting on 08/21/2014 for COPD   3d h/o increased congestion in head and chest, cough, wheezing, dyspnea. Cough not productive. Today with markedly worsening dyspnea and wheeze.  Seen 07/07/2014 for similar sxs, dx with COPD exacerbation and treated with doxy course. Did not need prednisone.  H/o diabetes COPD - compliant with symicort 2 puffs every morning. Has not recently been using albuterol.  Relevant past medical, surgical, family and social history reviewed and updated as indicated. Interim medical history since our last visit reviewed. Allergies and medications reviewed and updated. Current Outpatient Prescriptions on File Prior to Visit  Medication Sig  . albuterol (PROAIR HFA) 108 (90 BASE) MCG/ACT inhaler Inhale 2 puffs into the lungs 3 (three) times daily as needed (cough).  . Blood Glucose Monitoring Suppl (RELION CONFIRM GLUCOSE MONITOR) W/DEVICE KIT Check blood sugar once daily as needed.  Diagnosis:  250.00  Non-insulin dependent  . budesonide-formoterol (SYMBICORT) 160-4.5 MCG/ACT inhaler Inhale 2 puffs into the lungs every morning.  . fenofibrate (TRICOR) 145 MG tablet Take 1 tablet (145 mg total) by mouth daily.  . Fish Oil OIL Take 1 capsule by mouth 2 (two) times daily.    . fluticasone (FLONASE) 50 MCG/ACT nasal spray Place 2 sprays into the nose daily.  Marland Kitchen GLUCOSAMINE-CHONDROITIN PO Take 1 tablet by mouth 2 (two) times daily.    Marland Kitchen glucose blood (RELION CONFIRM/MICRO TEST) test strip Use as instructed to test blood sugar once daily as needed.  Diagnosis:  250.00  Non insulin-dependent.  . hydrochlorothiazide (HYDRODIURIL) 25 MG tablet TAKE ONE TABLET BY MOUTH ONCE DAILY  . HYDROcodone-acetaminophen  (NORCO/VICODIN) 5-325 MG per tablet   . lisinopril (PRINIVIL,ZESTRIL) 40 MG tablet TAKE ONE TABLET BY MOUTH ONCE DAILY  . lovastatin (MEVACOR) 40 MG tablet TAKE TWO TABLETS BY MOUTH AT BEDTIME, THIS REPLACES NIASPAN  . meclizine (ANTIVERT) 25 MG tablet Take 25 mg by mouth every 6 (six) hours as needed. For dizziness/vertigo  . metFORMIN (GLUCOPHAGE) 500 MG tablet Take 1,000 mg by mouth 2 (two) times daily with a meal.  . metFORMIN (GLUCOPHAGE) 500 MG tablet TAKE UP TO 2 TABLETS BY MOUTH IN THE MORNING AND 2 TABLETS IN THE EVENING.  . metoprolol tartrate (LOPRESSOR) 25 MG tablet TAKE ONE TABLET BY MOUTH TWICE DAILY  . nitroGLYCERIN (NITROSTAT) 0.4 MG SL tablet Place 1 tablet (0.4 mg total) under the tongue every 5 (five) minutes as needed. For chest pain  . RELION LANCETS MICRO-THIN 33G MISC Use to test blood sugar once daily as needed.  Diagnosis:  250.00   Non insulin dependent.  Marland Kitchen aspirin EC 81 MG EC tablet Take 1 tablet (81 mg total) by mouth 2 (two) times daily.  . benzonatate (TESSALON) 200 MG capsule Take 1 capsule (200 mg total) by mouth 3 (three) times daily as needed. (Patient not taking: Reported on 08/21/2014)   No current facility-administered medications on file prior to visit.   Past Medical History  Diagnosis Date  . CAD (coronary artery disease)     Pt had a MI in 1999 and had a stent placed in LAD. Had an inferior MI in 2005 and had a  drug-eluting stent placed in his RCA. Most recent functional study was Myoview in June 2007 with an EF of 61% and no evidence for ischemia  . Hypercholesterolemia   . Osteoarthritis of hip   . Osteoarthritis, knee   . Tobacco abuse     Prior  . Impaired fasting glucose     Elevated after steroid injection  . COPD (chronic obstructive pulmonary disease)   . HTN (hypertension)   . Myocardial infarction   . Shortness of breath   . H/O hiatal hernia   . Peri-rectal abscess 12/27/2011    Review of Systems Per HPI unless specifically indicated  above     Objective:    BP 128/70 mmHg  Pulse 82  Temp(Src) 98.2 F (36.8 C) (Oral)  Wt 199 lb 8 oz (90.493 kg)  SpO2 94%  Wt Readings from Last 3 Encounters:  08/21/14 199 lb 8 oz (90.493 kg)  07/07/14 194 lb 8 oz (88.225 kg)  05/20/14 197 lb (89.359 kg)    Physical Exam  Constitutional: He appears well-developed and well-nourished. No distress.  HENT:  Head: Normocephalic and atraumatic.  Right Ear: Hearing, tympanic membrane, external ear and ear canal normal.  Left Ear: Hearing, tympanic membrane, external ear and ear canal normal.  Nose: Mucosal edema (nasal mucosal congestion and injectoin) present. No rhinorrhea. Right sinus exhibits no maxillary sinus tenderness and no frontal sinus tenderness. Left sinus exhibits no maxillary sinus tenderness and no frontal sinus tenderness.  Mouth/Throat: Uvula is midline, oropharynx is clear and moist and mucous membranes are normal. No oropharyngeal exudate, posterior oropharyngeal edema, posterior oropharyngeal erythema or tonsillar abscesses.  Eyes: Conjunctivae and EOM are normal. Pupils are equal, round, and reactive to light. No scleral icterus.  Neck: Normal range of motion. Neck supple.  Cardiovascular: Normal rate, regular rhythm, normal heart sounds and intact distal pulses.   No murmur heard. Pulmonary/Chest: Accessory muscle usage present. Tachypnea noted. He is in respiratory distress. He has decreased breath sounds. He has wheezes (diffuse insp/exp coarse wheezing). He has no rhonchi. He has no rales.  After 2 albuterol/atrovent nebs, improved air movement, normalized work of breathing, persistent expiratory wheezing but otherwise clear lungs  Lymphadenopathy:    He has no cervical adenopathy.  Skin: Skin is warm and dry. No rash noted.  Nursing note and vitals reviewed.  Results for orders placed or performed in visit on 04/14/14  Hemoglobin A1c  Result Value Ref Range   Hgb A1c MFr Bld 7.2 (H) 4.6 - 6.5 %        Assessment & Plan:   Problem List Items Addressed This Visit    COPD exacerbation - Primary    2/3 gold criteria for COPD exacerbation with increased work of breathing on presentation, improved after albuterol/atrovent nebs x 2.  Will treat with prednisone and doxycycline course, continue albuterol 2 puffs Q4-6 hours for next 2 days then taper down. Will need to monitor sugars while on prednisone. Advised if recurrent significant dyspnea, to return tomorrow for recheck or to seek urgent care. Pt and wife agree with plan.      Relevant Medications   predniSONE (DELTASONE) tablet       Follow up plan: Return if symptoms worsen or fail to improve.

## 2014-08-21 NOTE — Patient Instructions (Addendum)
I think you had a COPD exacerbation. Treat with prednisone for 1 week and doxycycline for 10 days. Start using albuterol 2 puffs every 4-6 hours as needed, but regularly for the next 2 days. If worsening cough, shortness of breath, fever return to see us or seek urgent care.  Chronic Obstructive Pulmonary Disease Chronic obstructive pulmonary disease (COPD) is a common lung condition in which airflow from the lungs is limited. COPD is a general term that can be used to describe many different lung problems that limit airflow, including both chronic bronchitis and emphysema. If you have COPD, your lung function will probably never return to normal, but there are measures you can take to improve lung function and make yourself feel better.  CAUSES   Smoking (common).   Exposure to secondhand smoke.   Genetic problems.  Chronic inflammatory lung diseases or recurrent infections. SYMPTOMS   Shortness of breath, especially with physical activity.   Deep, persistent (chronic) cough with a large amount of thick mucus.   Wheezing.   Rapid breaths (tachypnea).   Gray or bluish discoloration (cyanosis) of the skin, especially in fingers, toes, or lips.   Fatigue.   Weight loss.   Frequent infections or episodes when breathing symptoms become much worse (exacerbations).   Chest tightness. DIAGNOSIS  Your health care provider will take a medical history and perform a physical examination to make the initial diagnosis. Additional tests for COPD may include:   Lung (pulmonary) function tests.  Chest X-ray.  CT scan.  Blood tests. TREATMENT  Treatment available to help you feel better when you have COPD includes:   Inhaler and nebulizer medicines. These help manage the symptoms of COPD and make your breathing more comfortable.  Supplemental oxygen. Supplemental oxygen is only helpful if you have a low oxygen level in your blood.   Exercise and physical activity.  These are beneficial for nearly all people with COPD. Some people may also benefit from a pulmonary rehabilitation program. HOME CARE INSTRUCTIONS   Take all medicines (inhaled or pills) as directed by your health care provider.  Avoid over-the-counter medicines or cough syrups that dry up your airway (such as antihistamines) and slow down the elimination of secretions unless instructed otherwise by your health care provider.   If you are a smoker, the most important thing that you can do is stop smoking. Continuing to smoke will cause further lung damage and breathing trouble. Ask your health care provider for help with quitting smoking. He or she can direct you to community resources or hospitals that provide support.  Avoid exposure to irritants such as smoke, chemicals, and fumes that aggravate your breathing.  Use oxygen therapy and pulmonary rehabilitation if directed by your health care provider. If you require home oxygen therapy, ask your health care provider whether you should purchase a pulse oximeter to measure your oxygen level at home.   Avoid contact with individuals who have a contagious illness.  Avoid extreme temperature and humidity changes.  Eat healthy foods. Eating smaller, more frequent meals and resting before meals may help you maintain your strength.  Stay active, but balance activity with periods of rest. Exercise and physical activity will help you maintain your ability to do things you want to do.  Preventing infection and hospitalization is very important when you have COPD. Make sure to receive all the vaccines your health care provider recommends, especially the pneumococcal and influenza vaccines. Ask your health care provider whether you need a pneumonia vaccine.  Learn and use relaxation techniques to manage stress.  Learn and use controlled breathing techniques as directed by your health care provider. Controlled breathing techniques include:   Pursed  lip breathing. Start by breathing in (inhaling) through your nose for 1 second. Then, purse your lips as if you were going to whistle and breathe out (exhale) through the pursed lips for 2 seconds.   Diaphragmatic breathing. Start by putting one hand on your abdomen just above your waist. Inhale slowly through your nose. The hand on your abdomen should move out. Then purse your lips and exhale slowly. You should be able to feel the hand on your abdomen moving in as you exhale.   Learn and use controlled coughing to clear mucus from your lungs. Controlled coughing is a series of short, progressive coughs. The steps of controlled coughing are:  1. Lean your head slightly forward.  2. Breathe in deeply using diaphragmatic breathing.  3. Try to hold your breath for 3 seconds.  4. Keep your mouth slightly open while coughing twice.  5. Spit any mucus out into a tissue.  6. Rest and repeat the steps once or twice as needed. SEEK MEDICAL CARE IF:   You are coughing up more mucus than usual.   There is a change in the color or thickness of your mucus.   Your breathing is more labored than usual.   Your breathing is faster than usual.  SEEK IMMEDIATE MEDICAL CARE IF:   You have shortness of breath while you are resting.   You have shortness of breath that prevents you from:  Being able to talk.   Performing your usual physical activities.   You have chest pain lasting longer than 5 minutes.   Your skin color is more cyanotic than usual.  You measure low oxygen saturations for longer than 5 minutes with a pulse oximeter. MAKE SURE YOU:   Understand these instructions.  Will watch your condition.  Will get help right away if you are not doing well or get worse. Document Released: 04/06/2005 Document Revised: 11/11/2013 Document Reviewed: 02/21/2013 [facility] Patient Information 2015 [place], [place]. This information is not intended to replace advice given to you by your  health care provider. Make sure you discuss any questions you have with your health care provider.

## 2014-08-21 NOTE — Assessment & Plan Note (Addendum)
2/3 gold criteria for COPD exacerbation with increased work of breathing on presentation, improved after albuterol/atrovent nebs x 2.  Will treat with prednisone and doxycycline course, continue albuterol 2 puffs Q4-6 hours for next 2 days then taper down. Will need to monitor sugars while on prednisone. Advised if recurrent significant dyspnea, to return tomorrow for recheck or to seek urgent care. Pt and wife agree with plan.

## 2014-08-25 ENCOUNTER — Telehealth: Payer: Self-pay

## 2014-08-25 NOTE — Telephone Encounter (Signed)
No CHF hx but does have CAD. Any chest pain or tightness? If so needs eval. Otherwise ensure he's been regular with scheduled albuterol inhaler 2 puffs Q4-6 hours.

## 2014-08-25 NOTE — Telephone Encounter (Signed)
Patient notified and verbalized understanding. Appt scheduled with PCP. Note forwarded for review.

## 2014-08-25 NOTE — Telephone Encounter (Signed)
Spoke with patient. He denies any CP or chest tightness. He said he did the albuterol inhaler as scheduled up until Sunday morning and then hasn't done anymore because he didn't think he was supposed to. He thought he was supposed to only do it for 2 days. He was concerned about long-term effects of it. He said he hasn't gotten worse, but hasn't gotten any better.

## 2014-08-25 NOTE — Telephone Encounter (Signed)
No h/o CHF but he does have CAD hx. Recommend continue albuterol 2 puffs every 6 hours scheduled over next 24 hours and re-eval in office tomorrow with myself or PCP given not improving as would be expected.

## 2014-08-25 NOTE — Telephone Encounter (Signed)
Pt was seen 08/21/14 and after taking neb treatment x 2 pt felt a lot better; pt has been taking abx and prednisone and if pt does not have exertion he is not SOB but if goes up steps pt does get SOB.pt does not want to come in for another appt but pt wanted to know what else could be done to improve his situation. [facility] [address]pt request cb.

## 2014-08-25 NOTE — Telephone Encounter (Signed)
Noted, thanks!

## 2014-08-26 ENCOUNTER — Encounter: Payer: Self-pay | Admitting: Family Medicine

## 2014-08-26 ENCOUNTER — Ambulatory Visit (INDEPENDENT_AMBULATORY_CARE_PROVIDER_SITE_OTHER): Payer: Medicare Other | Admitting: Family Medicine

## 2014-08-26 VITALS — BP 144/78 | HR 71 | Temp 97.6°F | Wt 197.8 lb

## 2014-08-26 DIAGNOSIS — J441 Chronic obstructive pulmonary disease with (acute) exacerbation: Secondary | ICD-10-CM | POA: Diagnosis not present

## 2014-08-26 MED ORDER — PREDNISONE 20 MG PO TABS: ORAL_TABLET | ORAL | Status: DC

## 2014-08-26 MED ORDER — BUDESONIDE-FORMOTEROL FUMARATE 160-4.5 MCG/ACT IN AERO: 2.0000 | INHALATION_SPRAY | Freq: Two times a day (BID) | RESPIRATORY_TRACT | Status: DC

## 2014-08-26 NOTE — Assessment & Plan Note (Signed)
Finish doxy in the meantime.  Take two puffs of pulmicort twice a day, rinse after use, ie inc dose.  If worse after finishing the prednisone, then restart a second round and notify me.  Hold printed pred rx for now.  He agrees.  Try to taper off the albuterol as tolerated.  In about 2 months, he'll call back to schedule spirometry/pulmonary function testing here.

## 2014-08-26 NOTE — Progress Notes (Signed)
Pre visit review using our clinic review tool, if applicable. No additional management support is needed unless otherwise documented below in the visit note.  F/u for COPD exacerbation.  Tomorrow will be last dose of prednisone. Still on doxycyline w/o ADE.  Hoarse after restarting SABA. Dec in SOB with SABA, feels better today.   He rinses after symbicort, uses daily.   The recent illness was triggered quickly, likely with an antecedent URI.   He can walk better today w/o less SOB.  Still with some wheeze.   Meds, vitals, and allergies reviewed.   ROS: See HPI.  Otherwise, noncontributory.  GEN: nad, alert and oriented HEENT: mucous membranes moist NECK: supple w/o LA CV: rrr.  PULM: ctab except for scant B exp wheeze, no inc wob ABD: soft, +bs EXT: no edema

## 2014-08-26 NOTE — Patient Instructions (Signed)
Finish the antibiotics in the meantime.   Take two puffs of pulmicort twice a day, rinse after use.  If you get worse after finishing the prednisone, then restart it and notify me.  Try to taper off the albuterol as tolerated.  In about 2 months, call back to schedule spirometry/pulmonary function testing here.  We should only do that if you are feeling well.  Take care.   Glad to see you.

## 2014-08-29 ENCOUNTER — Emergency Department: Payer: Self-pay | Admitting: Emergency Medicine

## 2014-08-29 DIAGNOSIS — M79662 Pain in left lower leg: Secondary | ICD-10-CM | POA: Diagnosis not present

## 2014-08-29 DIAGNOSIS — I700 Atherosclerosis of aorta: Secondary | ICD-10-CM | POA: Diagnosis not present

## 2014-08-29 DIAGNOSIS — M79672 Pain in left foot: Secondary | ICD-10-CM | POA: Diagnosis not present

## 2014-08-29 DIAGNOSIS — R110 Nausea: Secondary | ICD-10-CM | POA: Diagnosis not present

## 2014-08-29 DIAGNOSIS — I82432 Acute embolism and thrombosis of left popliteal vein: Secondary | ICD-10-CM | POA: Diagnosis not present

## 2014-08-29 DIAGNOSIS — I998 Other disorder of circulatory system: Secondary | ICD-10-CM | POA: Diagnosis not present

## 2014-08-29 DIAGNOSIS — R0600 Dyspnea, unspecified: Secondary | ICD-10-CM | POA: Diagnosis not present

## 2014-08-29 DIAGNOSIS — M7732 Calcaneal spur, left foot: Secondary | ICD-10-CM | POA: Diagnosis not present

## 2014-08-30 ENCOUNTER — Other Ambulatory Visit: Payer: Self-pay | Admitting: Emergency Medicine

## 2014-08-30 ENCOUNTER — Inpatient Hospital Stay: Payer: Self-pay | Admitting: Surgery

## 2014-08-30 DIAGNOSIS — K3189 Other diseases of stomach and duodenum: Secondary | ICD-10-CM | POA: Diagnosis not present

## 2014-08-30 DIAGNOSIS — M62262 Nontraumatic ischemic infarction of muscle, left lower leg: Secondary | ICD-10-CM | POA: Diagnosis not present

## 2014-08-30 DIAGNOSIS — M79662 Pain in left lower leg: Secondary | ICD-10-CM | POA: Diagnosis not present

## 2014-08-30 DIAGNOSIS — I739 Peripheral vascular disease, unspecified: Secondary | ICD-10-CM | POA: Diagnosis not present

## 2014-08-30 DIAGNOSIS — R110 Nausea: Secondary | ICD-10-CM | POA: Diagnosis not present

## 2014-08-30 DIAGNOSIS — Z7982 Long term (current) use of aspirin: Secondary | ICD-10-CM | POA: Diagnosis not present

## 2014-08-30 DIAGNOSIS — R0600 Dyspnea, unspecified: Secondary | ICD-10-CM | POA: Diagnosis not present

## 2014-08-30 DIAGNOSIS — R140 Abdominal distension (gaseous): Secondary | ICD-10-CM | POA: Diagnosis not present

## 2014-08-30 DIAGNOSIS — M79609 Pain in unspecified limb: Secondary | ICD-10-CM | POA: Diagnosis not present

## 2014-08-30 DIAGNOSIS — R609 Edema, unspecified: Secondary | ICD-10-CM | POA: Diagnosis present

## 2014-08-30 DIAGNOSIS — F17210 Nicotine dependence, cigarettes, uncomplicated: Secondary | ICD-10-CM | POA: Diagnosis present

## 2014-08-30 DIAGNOSIS — I82432 Acute embolism and thrombosis of left popliteal vein: Secondary | ICD-10-CM | POA: Diagnosis not present

## 2014-08-30 DIAGNOSIS — I998 Other disorder of circulatory system: Secondary | ICD-10-CM | POA: Diagnosis present

## 2014-08-30 DIAGNOSIS — E119 Type 2 diabetes mellitus without complications: Secondary | ICD-10-CM | POA: Diagnosis not present

## 2014-08-30 DIAGNOSIS — Z4682 Encounter for fitting and adjustment of non-vascular catheter: Secondary | ICD-10-CM | POA: Diagnosis not present

## 2014-08-30 DIAGNOSIS — J441 Chronic obstructive pulmonary disease with (acute) exacerbation: Secondary | ICD-10-CM | POA: Diagnosis not present

## 2014-08-30 DIAGNOSIS — E860 Dehydration: Secondary | ICD-10-CM | POA: Diagnosis present

## 2014-08-30 DIAGNOSIS — T8131XA Disruption of external operation (surgical) wound, not elsewhere classified, initial encounter: Secondary | ICD-10-CM | POA: Diagnosis not present

## 2014-08-30 DIAGNOSIS — K310 Acute dilatation of stomach: Secondary | ICD-10-CM | POA: Diagnosis not present

## 2014-08-30 DIAGNOSIS — I724 Aneurysm of artery of lower extremity: Secondary | ICD-10-CM | POA: Diagnosis not present

## 2014-08-30 DIAGNOSIS — E1159 Type 2 diabetes mellitus with other circulatory complications: Secondary | ICD-10-CM | POA: Diagnosis not present

## 2014-08-30 DIAGNOSIS — I252 Old myocardial infarction: Secondary | ICD-10-CM | POA: Diagnosis not present

## 2014-08-30 DIAGNOSIS — E780 Pure hypercholesterolemia: Secondary | ICD-10-CM | POA: Diagnosis not present

## 2014-08-30 DIAGNOSIS — I10 Essential (primary) hypertension: Secondary | ICD-10-CM | POA: Diagnosis not present

## 2014-08-30 DIAGNOSIS — Z79891 Long term (current) use of opiate analgesic: Secondary | ICD-10-CM | POA: Diagnosis not present

## 2014-08-30 DIAGNOSIS — R112 Nausea with vomiting, unspecified: Secondary | ICD-10-CM | POA: Diagnosis present

## 2014-08-30 DIAGNOSIS — M7732 Calcaneal spur, left foot: Secondary | ICD-10-CM | POA: Diagnosis not present

## 2014-08-30 DIAGNOSIS — I743 Embolism and thrombosis of arteries of the lower extremities: Secondary | ICD-10-CM | POA: Diagnosis not present

## 2014-08-30 DIAGNOSIS — I700 Atherosclerosis of aorta: Secondary | ICD-10-CM | POA: Diagnosis not present

## 2014-08-30 DIAGNOSIS — N179 Acute kidney failure, unspecified: Secondary | ICD-10-CM | POA: Diagnosis present

## 2014-08-30 DIAGNOSIS — I2510 Atherosclerotic heart disease of native coronary artery without angina pectoris: Secondary | ICD-10-CM | POA: Diagnosis not present

## 2014-08-30 DIAGNOSIS — M79672 Pain in left foot: Secondary | ICD-10-CM | POA: Diagnosis not present

## 2014-08-30 DIAGNOSIS — M25569 Pain in unspecified knee: Secondary | ICD-10-CM | POA: Diagnosis not present

## 2014-09-08 ENCOUNTER — Telehealth: Payer: Self-pay | Admitting: *Deleted

## 2014-09-08 DIAGNOSIS — Z48812 Encounter for surgical aftercare following surgery on the circulatory system: Secondary | ICD-10-CM | POA: Diagnosis not present

## 2014-09-08 DIAGNOSIS — I10 Essential (primary) hypertension: Secondary | ICD-10-CM | POA: Diagnosis not present

## 2014-09-08 DIAGNOSIS — E119 Type 2 diabetes mellitus without complications: Secondary | ICD-10-CM | POA: Diagnosis not present

## 2014-09-08 DIAGNOSIS — J449 Chronic obstructive pulmonary disease, unspecified: Secondary | ICD-10-CM | POA: Diagnosis not present

## 2014-09-08 DIAGNOSIS — M6281 Muscle weakness (generalized): Secondary | ICD-10-CM | POA: Diagnosis not present

## 2014-09-08 DIAGNOSIS — T79A22D Traumatic compartment syndrome of left lower extremity, subsequent encounter: Secondary | ICD-10-CM | POA: Diagnosis not present

## 2014-09-08 NOTE — Telephone Encounter (Signed)
Jacki ConesLaurie with [facility] advised.

## 2014-09-08 NOTE — Telephone Encounter (Signed)
Please give the order.  Last A1c was 7.2 on 04/14/14.  Due for recheck in ~10/2014.  Thanks.

## 2014-09-08 NOTE — Telephone Encounter (Signed)
[facility] requests verbal orders for PT 3 x/week for 2 weeks and then 2 x/week for 2 weeks.  Also requests most recent A1c and when he is due to have it drawn again.

## 2014-09-09 ENCOUNTER — Encounter: Payer: Self-pay | Admitting: Family Medicine

## 2014-09-09 DIAGNOSIS — I70219 Atherosclerosis of native arteries of extremities with intermittent claudication, unspecified extremity: Secondary | ICD-10-CM | POA: Insufficient documentation

## 2014-09-09 DIAGNOSIS — I739 Peripheral vascular disease, unspecified: Secondary | ICD-10-CM | POA: Insufficient documentation

## 2014-09-10 DIAGNOSIS — I10 Essential (primary) hypertension: Secondary | ICD-10-CM | POA: Diagnosis not present

## 2014-09-10 DIAGNOSIS — T79A22D Traumatic compartment syndrome of left lower extremity, subsequent encounter: Secondary | ICD-10-CM | POA: Diagnosis not present

## 2014-09-10 DIAGNOSIS — M6281 Muscle weakness (generalized): Secondary | ICD-10-CM | POA: Diagnosis not present

## 2014-09-10 DIAGNOSIS — Z48812 Encounter for surgical aftercare following surgery on the circulatory system: Secondary | ICD-10-CM | POA: Diagnosis not present

## 2014-09-10 DIAGNOSIS — E119 Type 2 diabetes mellitus without complications: Secondary | ICD-10-CM | POA: Diagnosis not present

## 2014-09-10 DIAGNOSIS — J449 Chronic obstructive pulmonary disease, unspecified: Secondary | ICD-10-CM | POA: Diagnosis not present

## 2014-09-12 ENCOUNTER — Telehealth: Payer: Self-pay | Admitting: Family Medicine

## 2014-09-12 ENCOUNTER — Other Ambulatory Visit: Payer: Self-pay | Admitting: Cardiology

## 2014-09-12 DIAGNOSIS — M6281 Muscle weakness (generalized): Secondary | ICD-10-CM | POA: Diagnosis not present

## 2014-09-12 DIAGNOSIS — Z48812 Encounter for surgical aftercare following surgery on the circulatory system: Secondary | ICD-10-CM | POA: Diagnosis not present

## 2014-09-12 DIAGNOSIS — E119 Type 2 diabetes mellitus without complications: Secondary | ICD-10-CM | POA: Diagnosis not present

## 2014-09-12 DIAGNOSIS — T79A22D Traumatic compartment syndrome of left lower extremity, subsequent encounter: Secondary | ICD-10-CM | POA: Diagnosis not present

## 2014-09-12 DIAGNOSIS — J449 Chronic obstructive pulmonary disease, unspecified: Secondary | ICD-10-CM | POA: Diagnosis not present

## 2014-09-12 DIAGNOSIS — I10 Essential (primary) hypertension: Secondary | ICD-10-CM | POA: Diagnosis not present

## 2014-09-12 MED ORDER — METFORMIN HCL 500 MG PO TABS: 500.0000 mg | ORAL_TABLET | Freq: Two times a day (BID) | ORAL | Status: DC

## 2014-09-12 NOTE — Telephone Encounter (Signed)
His days and night may be reversed.  This isn't necessarily pathologic.  However, if he is delirious then he needs to be checked. I would take metformin 500mg  BID for now and update us re: his sugars early next week.  Thanks.

## 2014-09-12 NOTE — Telephone Encounter (Signed)
Leg is healing well.  He is off Oxycontin because it messes up his thought processes so bad.  He is taking Ibuprofen for pain.  Wife says he isn't sleeping at night and he wanders around.  He thinks he is supposed to be working at night.  By 6 am, he goes to sleep and sleeps well.  It seems that near bedtime, he gets agitated and gets everything turned around.  He is back on his regular meds, except for the confusion with the Metformin. There are two dosing instructions on the meds sheet and wife isn't sure which he is supposed to be taking.  Please advise.

## 2014-09-12 NOTE — Telephone Encounter (Signed)
Wife advised.  She will get BS's over the weekend and call with results early next week and also report on his sleep routine.

## 2014-09-12 NOTE — Telephone Encounter (Signed)
Pts wife wants clarification on what dosage he should be taking regarding his Metformin. According to his medication list it has it listed twice. Please clarify.Pts wife would like a call back also bc she has some questions regarding some side affects that he is having. Wife is requesting a call as soon as possible

## 2014-09-15 DIAGNOSIS — M6281 Muscle weakness (generalized): Secondary | ICD-10-CM | POA: Diagnosis not present

## 2014-09-15 DIAGNOSIS — J449 Chronic obstructive pulmonary disease, unspecified: Secondary | ICD-10-CM | POA: Diagnosis not present

## 2014-09-15 DIAGNOSIS — E119 Type 2 diabetes mellitus without complications: Secondary | ICD-10-CM | POA: Diagnosis not present

## 2014-09-15 DIAGNOSIS — I10 Essential (primary) hypertension: Secondary | ICD-10-CM | POA: Diagnosis not present

## 2014-09-15 DIAGNOSIS — Z48812 Encounter for surgical aftercare following surgery on the circulatory system: Secondary | ICD-10-CM | POA: Diagnosis not present

## 2014-09-15 DIAGNOSIS — T79A22D Traumatic compartment syndrome of left lower extremity, subsequent encounter: Secondary | ICD-10-CM | POA: Diagnosis not present

## 2014-09-15 NOTE — Telephone Encounter (Signed)
Good, would continue as is and please update me in about a week, sooner if needed. Thanks.

## 2014-09-15 NOTE — Telephone Encounter (Signed)
Wife advised. 

## 2014-09-15 NOTE — Telephone Encounter (Signed)
Mrs Elige KoWillets left v/m; 09/12/14 at 5pm BS 171; 09/13/14 at 10 AM BS was 137; 09/13/13 at 6:30 PM BS was 144; 09/15/14 at 2:45PM  BS was 140. Pt is less nauseated and memory is doing better as well.

## 2014-09-17 DIAGNOSIS — J449 Chronic obstructive pulmonary disease, unspecified: Secondary | ICD-10-CM | POA: Diagnosis not present

## 2014-09-17 DIAGNOSIS — M6281 Muscle weakness (generalized): Secondary | ICD-10-CM | POA: Diagnosis not present

## 2014-09-17 DIAGNOSIS — I10 Essential (primary) hypertension: Secondary | ICD-10-CM | POA: Diagnosis not present

## 2014-09-17 DIAGNOSIS — E119 Type 2 diabetes mellitus without complications: Secondary | ICD-10-CM | POA: Diagnosis not present

## 2014-09-17 DIAGNOSIS — Z48812 Encounter for surgical aftercare following surgery on the circulatory system: Secondary | ICD-10-CM | POA: Diagnosis not present

## 2014-09-17 DIAGNOSIS — T79A22D Traumatic compartment syndrome of left lower extremity, subsequent encounter: Secondary | ICD-10-CM | POA: Diagnosis not present

## 2014-09-19 DIAGNOSIS — T79A22D Traumatic compartment syndrome of left lower extremity, subsequent encounter: Secondary | ICD-10-CM | POA: Diagnosis not present

## 2014-09-19 DIAGNOSIS — I10 Essential (primary) hypertension: Secondary | ICD-10-CM | POA: Diagnosis not present

## 2014-09-19 DIAGNOSIS — E119 Type 2 diabetes mellitus without complications: Secondary | ICD-10-CM | POA: Diagnosis not present

## 2014-09-19 DIAGNOSIS — M6281 Muscle weakness (generalized): Secondary | ICD-10-CM | POA: Diagnosis not present

## 2014-09-19 DIAGNOSIS — Z48812 Encounter for surgical aftercare following surgery on the circulatory system: Secondary | ICD-10-CM | POA: Diagnosis not present

## 2014-09-19 DIAGNOSIS — J449 Chronic obstructive pulmonary disease, unspecified: Secondary | ICD-10-CM | POA: Diagnosis not present

## 2014-09-22 ENCOUNTER — Telehealth: Payer: Self-pay | Admitting: Family Medicine

## 2014-09-22 NOTE — Telephone Encounter (Signed)
Opened in error

## 2014-09-25 DIAGNOSIS — I724 Aneurysm of artery of lower extremity: Secondary | ICD-10-CM | POA: Diagnosis not present

## 2014-09-25 DIAGNOSIS — E780 Pure hypercholesterolemia: Secondary | ICD-10-CM | POA: Diagnosis not present

## 2014-09-25 DIAGNOSIS — L97209 Non-pressure chronic ulcer of unspecified calf with unspecified severity: Secondary | ICD-10-CM | POA: Diagnosis not present

## 2014-09-25 DIAGNOSIS — M79609 Pain in unspecified limb: Secondary | ICD-10-CM | POA: Diagnosis not present

## 2014-09-25 DIAGNOSIS — M7989 Other specified soft tissue disorders: Secondary | ICD-10-CM | POA: Diagnosis not present

## 2014-09-25 DIAGNOSIS — E119 Type 2 diabetes mellitus without complications: Secondary | ICD-10-CM | POA: Diagnosis not present

## 2014-09-25 DIAGNOSIS — I890 Lymphedema, not elsewhere classified: Secondary | ICD-10-CM | POA: Diagnosis not present

## 2014-09-25 DIAGNOSIS — M25569 Pain in unspecified knee: Secondary | ICD-10-CM | POA: Diagnosis not present

## 2014-09-25 DIAGNOSIS — I10 Essential (primary) hypertension: Secondary | ICD-10-CM | POA: Diagnosis not present

## 2014-09-25 DIAGNOSIS — I2510 Atherosclerotic heart disease of native coronary artery without angina pectoris: Secondary | ICD-10-CM | POA: Diagnosis not present

## 2014-10-02 DIAGNOSIS — E780 Pure hypercholesterolemia: Secondary | ICD-10-CM | POA: Diagnosis not present

## 2014-10-02 DIAGNOSIS — M79609 Pain in unspecified limb: Secondary | ICD-10-CM | POA: Diagnosis not present

## 2014-10-02 DIAGNOSIS — I10 Essential (primary) hypertension: Secondary | ICD-10-CM | POA: Diagnosis not present

## 2014-10-02 DIAGNOSIS — M7989 Other specified soft tissue disorders: Secondary | ICD-10-CM | POA: Diagnosis not present

## 2014-10-02 DIAGNOSIS — M25569 Pain in unspecified knee: Secondary | ICD-10-CM | POA: Diagnosis not present

## 2014-10-02 DIAGNOSIS — E119 Type 2 diabetes mellitus without complications: Secondary | ICD-10-CM | POA: Diagnosis not present

## 2014-10-02 DIAGNOSIS — I2510 Atherosclerotic heart disease of native coronary artery without angina pectoris: Secondary | ICD-10-CM | POA: Diagnosis not present

## 2014-10-02 DIAGNOSIS — L97209 Non-pressure chronic ulcer of unspecified calf with unspecified severity: Secondary | ICD-10-CM | POA: Diagnosis not present

## 2014-10-02 DIAGNOSIS — I724 Aneurysm of artery of lower extremity: Secondary | ICD-10-CM | POA: Diagnosis not present

## 2014-10-02 DIAGNOSIS — I890 Lymphedema, not elsewhere classified: Secondary | ICD-10-CM | POA: Diagnosis not present

## 2014-10-09 DIAGNOSIS — M25569 Pain in unspecified knee: Secondary | ICD-10-CM | POA: Diagnosis not present

## 2014-10-09 DIAGNOSIS — I10 Essential (primary) hypertension: Secondary | ICD-10-CM | POA: Diagnosis not present

## 2014-10-09 DIAGNOSIS — E119 Type 2 diabetes mellitus without complications: Secondary | ICD-10-CM | POA: Diagnosis not present

## 2014-10-09 DIAGNOSIS — L97209 Non-pressure chronic ulcer of unspecified calf with unspecified severity: Secondary | ICD-10-CM | POA: Diagnosis not present

## 2014-10-09 DIAGNOSIS — I739 Peripheral vascular disease, unspecified: Secondary | ICD-10-CM | POA: Diagnosis not present

## 2014-10-09 DIAGNOSIS — E780 Pure hypercholesterolemia: Secondary | ICD-10-CM | POA: Diagnosis not present

## 2014-10-09 DIAGNOSIS — I724 Aneurysm of artery of lower extremity: Secondary | ICD-10-CM | POA: Diagnosis not present

## 2014-10-09 DIAGNOSIS — I2510 Atherosclerotic heart disease of native coronary artery without angina pectoris: Secondary | ICD-10-CM | POA: Diagnosis not present

## 2014-10-09 DIAGNOSIS — I890 Lymphedema, not elsewhere classified: Secondary | ICD-10-CM | POA: Diagnosis not present

## 2014-10-09 DIAGNOSIS — M7989 Other specified soft tissue disorders: Secondary | ICD-10-CM | POA: Diagnosis not present

## 2014-10-09 DIAGNOSIS — M79609 Pain in unspecified limb: Secondary | ICD-10-CM | POA: Diagnosis not present

## 2014-10-12 ENCOUNTER — Other Ambulatory Visit: Payer: Self-pay | Admitting: Family Medicine

## 2014-10-12 DIAGNOSIS — I739 Peripheral vascular disease, unspecified: Secondary | ICD-10-CM

## 2014-10-12 DIAGNOSIS — IMO0002 Reserved for concepts with insufficient information to code with codable children: Secondary | ICD-10-CM

## 2014-10-12 DIAGNOSIS — I2510 Atherosclerotic heart disease of native coronary artery without angina pectoris: Secondary | ICD-10-CM

## 2014-10-12 DIAGNOSIS — E1165 Type 2 diabetes mellitus with hyperglycemia: Secondary | ICD-10-CM

## 2014-10-13 ENCOUNTER — Other Ambulatory Visit (INDEPENDENT_AMBULATORY_CARE_PROVIDER_SITE_OTHER): Payer: Medicare Other

## 2014-10-13 DIAGNOSIS — I2510 Atherosclerotic heart disease of native coronary artery without angina pectoris: Secondary | ICD-10-CM

## 2014-10-13 DIAGNOSIS — IMO0002 Reserved for concepts with insufficient information to code with codable children: Secondary | ICD-10-CM

## 2014-10-13 DIAGNOSIS — E1165 Type 2 diabetes mellitus with hyperglycemia: Secondary | ICD-10-CM | POA: Diagnosis not present

## 2014-10-13 LAB — COMPREHENSIVE METABOLIC PANEL
ALBUMIN: 3.8 g/dL (ref 3.5–5.2)
ALT: 14 U/L (ref 0–53)
AST: 15 U/L (ref 0–37)
Alkaline Phosphatase: 30 U/L — ABNORMAL LOW (ref 39–117)
BUN: 22 mg/dL (ref 6–23)
CO2: 29 meq/L (ref 19–32)
Calcium: 9.7 mg/dL (ref 8.4–10.5)
Chloride: 103 mEq/L (ref 96–112)
Creatinine, Ser: 1.28 mg/dL (ref 0.40–1.50)
GFR: 59.14 mL/min — AB (ref >60.00)
GLUCOSE: 160 mg/dL — AB (ref 70–99)
Potassium: 4.1 mEq/L (ref 3.5–5.1)
SODIUM: 138 meq/L (ref 135–145)
Total Bilirubin: 0.6 mg/dL (ref 0.2–1.2)
Total Protein: 6.6 g/dL (ref 6.0–8.3)

## 2014-10-13 LAB — CBC WITH DIFFERENTIAL/PLATELET
BASOS PCT: 0.7 % (ref 0.0–3.0)
Basophils Absolute: 0 10*3/uL (ref 0.0–0.1)
EOS PCT: 4.4 % (ref 0.0–5.0)
Eosinophils Absolute: 0.3 10*3/uL (ref 0.0–0.7)
HEMATOCRIT: 44.3 % (ref 39.0–52.0)
Hemoglobin: 14.9 g/dL (ref 13.0–17.0)
Lymphocytes Relative: 26 % (ref 12.0–46.0)
Lymphs Abs: 1.8 10*3/uL (ref 0.7–4.0)
MCHC: 33.7 g/dL (ref 30.0–36.0)
MCV: 91 fl (ref 78.0–100.0)
Monocytes Absolute: 0.6 10*3/uL (ref 0.1–1.0)
Monocytes Relative: 8.2 % (ref 3.0–12.0)
NEUTROS PCT: 60.7 % (ref 43.0–77.0)
Neutro Abs: 4.1 10*3/uL (ref 1.4–7.7)
PLATELETS: 210 10*3/uL (ref 150.0–400.0)
RBC: 4.86 Mil/uL (ref 4.22–5.81)
RDW: 15.2 % (ref 11.5–15.5)
WBC: 6.7 10*3/uL (ref 4.0–10.5)

## 2014-10-13 LAB — LIPID PANEL
Cholesterol: 143 mg/dL (ref 0–200)
HDL: 24.1 mg/dL — ABNORMAL LOW
NonHDL: 118.9
Total CHOL/HDL Ratio: 6
Triglycerides: 341 mg/dL — ABNORMAL HIGH (ref 0.0–149.0)
VLDL: 68.2 mg/dL — ABNORMAL HIGH (ref 0.0–40.0)

## 2014-10-13 LAB — HEMOGLOBIN A1C: Hgb A1c MFr Bld: 7.6 % — ABNORMAL HIGH (ref 4.6–6.5)

## 2014-10-13 LAB — LDL CHOLESTEROL, DIRECT: Direct LDL: 67 mg/dL

## 2014-10-20 ENCOUNTER — Ambulatory Visit (INDEPENDENT_AMBULATORY_CARE_PROVIDER_SITE_OTHER): Payer: Medicare Other | Admitting: Family Medicine

## 2014-10-20 ENCOUNTER — Encounter: Payer: Self-pay | Admitting: Family Medicine

## 2014-10-20 VITALS — BP 130/72 | HR 58 | Temp 97.6°F | Ht 68.0 in | Wt 197.8 lb

## 2014-10-20 DIAGNOSIS — I739 Peripheral vascular disease, unspecified: Secondary | ICD-10-CM

## 2014-10-20 DIAGNOSIS — E785 Hyperlipidemia, unspecified: Secondary | ICD-10-CM | POA: Diagnosis not present

## 2014-10-20 DIAGNOSIS — Z23 Encounter for immunization: Secondary | ICD-10-CM

## 2014-10-20 DIAGNOSIS — I2510 Atherosclerotic heart disease of native coronary artery without angina pectoris: Secondary | ICD-10-CM

## 2014-10-20 DIAGNOSIS — Z0000 Encounter for general adult medical examination without abnormal findings: Secondary | ICD-10-CM | POA: Diagnosis not present

## 2014-10-20 DIAGNOSIS — E1159 Type 2 diabetes mellitus with other circulatory complications: Secondary | ICD-10-CM | POA: Diagnosis not present

## 2014-10-20 DIAGNOSIS — Z7189 Other specified counseling: Secondary | ICD-10-CM

## 2014-10-20 DIAGNOSIS — E1151 Type 2 diabetes mellitus with diabetic peripheral angiopathy without gangrene: Secondary | ICD-10-CM

## 2014-10-20 DIAGNOSIS — E119 Type 2 diabetes mellitus without complications: Secondary | ICD-10-CM

## 2014-10-20 NOTE — Progress Notes (Signed)
Pre visit review using our clinic review tool, if applicable. No additional management support is needed unless otherwise documented below in the visit note.  I have personally reviewed the Medicare Annual Wellness questionnaire and have noted 1. The patient's medical and social history 2. Their use of alcohol, tobacco or illicit drugs 3. Their current medications and supplements 4. The patient's functional ability including ADL's, fall risks, home safety risks and hearing or visual             impairment. 5. Diet and physical activities 6. Evidence for depression or mood disorders  The patients weight, height, BMI have been recorded in the chart and visual acuity is per eye clinic.  I have made referrals, counseling and provided education to the patient based review of the above and I have provided the pt with a written personalized care plan for preventive services.  Provider list updated- see scanned forms.  Routine anticipatory guidance given to patient.  See health maintenance.  Flu 2015 Shingles encouraged PNA 2013 Tetanus 2011 Colonoscopy 2012 Prostate cancer screening and PSA options (with potential risks and benefits of testing vs not testing) were discussed along with recent recs/guidelines.  He declined testing PSA at this point. Advance directive- wife designated if patient were incapacitated.  Cognitive function addressed- see scanned forms- and if abnormal then additional documentation follows.   L fem pop bypass done emergently after occlusion.  Severe pain initially, to ER, and surgery done.  Still with some swelling in the L calf and foot, some residual nerve irritation with pain and numbness at the surgery sites, but clearly with dec in pain in the leg overall.  2 wounds fully healed, with the L lower lateral site almost done healing.  He had f/u with vascular surgery pending.  He is/has slowly returned to activity.    Diabetes:  Using medications without  difficulties:yes Hypoglycemic episodes:no Hyperglycemic episodes:no Feet problems:no Blood Sugars averaging: 125-160 eye exam within last year: f/u pending.   His exercise and diet were upended by recent surgery.  A1c slightly up, this is actually good considering his recent events.  D/w pt.   PMH and SH reviewed  Meds, vitals, and allergies reviewed.   ROS: See HPI.  Otherwise negative.    GEN: nad, alert and oriented HEENT: mucous membranes moist NECK: supple w/o LA CV: rrr. PULM: ctab, no inc wob ABD: soft, +bs EXT: no edema on R leg but 1+ edema on L lower leg SKIN: no acute rash L calf with healing incision noted on lateral side, healed medially.   No erythema on legs  Diabetic foot exam: Normal inspection No skin breakdown No calluses  Normal DP pulses Normal sensation to light touch and monofilament Nails normal

## 2014-10-20 NOTE — Patient Instructions (Signed)
Recheck A1c in about 3 months.  Work on diet and getting up and moving the meantime.  Take care.  Glad to see you.

## 2014-10-21 DIAGNOSIS — Z7189 Other specified counseling: Secondary | ICD-10-CM | POA: Insufficient documentation

## 2014-10-21 DIAGNOSIS — Z0000 Encounter for general adult medical examination without abnormal findings: Secondary | ICD-10-CM | POA: Insufficient documentation

## 2014-10-21 NOTE — Assessment & Plan Note (Signed)
Slowly healing but doing well overall, his pain is gradually improving and controlled well enough with tylenol.  Continue compression stockings.  App help from vascular surgery.

## 2014-10-21 NOTE — Assessment & Plan Note (Addendum)
Uncontrolled by A1c but expected/acceptable for now, given recent events.  He has f/u with vascular pending.  Secondary prevention is the goal now.  He'll work on A1c via inc exercise (gradually) better diet and we'll recheck in about 3 months.  He agrees.

## 2014-10-21 NOTE — Assessment & Plan Note (Signed)
Flu 2015 Shingles encouraged PNA 2013 Tetanus 2011 Colonoscopy 2012 Prostate cancer screening and PSA options (with potential risks and benefits of testing vs not testing) were discussed along with recent recs/guidelines.  He declined testing PSA at this point. Advance directive- wife designated if patient were incapacitated.  Cognitive function addressed- see scanned forms- and if abnormal then additional documentation follows.

## 2014-10-21 NOTE — Assessment & Plan Note (Signed)
LDL controlled, but with TG elevated as expected given his hx.  Continue statin.  No ADE on med.  Continue work on diet and exercise.

## 2014-10-24 ENCOUNTER — Inpatient Hospital Stay
Admit: 2014-10-24 | Disposition: A | Discharge status: Home / Self Care | Payer: Self-pay | Attending: Internal Medicine | Admitting: Internal Medicine

## 2014-10-24 DIAGNOSIS — T82857A Stenosis of cardiac prosthetic devices, implants and grafts, initial encounter: Secondary | ICD-10-CM | POA: Diagnosis present

## 2014-10-24 DIAGNOSIS — R0602 Shortness of breath: Secondary | ICD-10-CM | POA: Diagnosis not present

## 2014-10-24 DIAGNOSIS — I252 Old myocardial infarction: Secondary | ICD-10-CM | POA: Diagnosis not present

## 2014-10-24 DIAGNOSIS — N179 Acute kidney failure, unspecified: Secondary | ICD-10-CM | POA: Diagnosis not present

## 2014-10-24 DIAGNOSIS — Z9889 Other specified postprocedural states: Secondary | ICD-10-CM | POA: Diagnosis not present

## 2014-10-24 DIAGNOSIS — N289 Disorder of kidney and ureter, unspecified: Secondary | ICD-10-CM | POA: Diagnosis not present

## 2014-10-24 DIAGNOSIS — E780 Pure hypercholesterolemia: Secondary | ICD-10-CM | POA: Diagnosis present

## 2014-10-24 DIAGNOSIS — M1610 Unilateral primary osteoarthritis, unspecified hip: Secondary | ICD-10-CM | POA: Diagnosis present

## 2014-10-24 DIAGNOSIS — K279 Peptic ulcer, site unspecified, unspecified as acute or chronic, without hemorrhage or perforation: Secondary | ICD-10-CM | POA: Diagnosis present

## 2014-10-24 DIAGNOSIS — Z8249 Family history of ischemic heart disease and other diseases of the circulatory system: Secondary | ICD-10-CM | POA: Diagnosis not present

## 2014-10-24 DIAGNOSIS — I739 Peripheral vascular disease, unspecified: Secondary | ICD-10-CM | POA: Diagnosis not present

## 2014-10-24 DIAGNOSIS — Z79899 Other long term (current) drug therapy: Secondary | ICD-10-CM | POA: Diagnosis not present

## 2014-10-24 DIAGNOSIS — Z955 Presence of coronary angioplasty implant and graft: Secondary | ICD-10-CM | POA: Diagnosis not present

## 2014-10-24 DIAGNOSIS — I214 Non-ST elevation (NSTEMI) myocardial infarction: Secondary | ICD-10-CM | POA: Diagnosis not present

## 2014-10-24 DIAGNOSIS — E119 Type 2 diabetes mellitus without complications: Secondary | ICD-10-CM | POA: Diagnosis not present

## 2014-10-24 DIAGNOSIS — J449 Chronic obstructive pulmonary disease, unspecified: Secondary | ICD-10-CM | POA: Diagnosis not present

## 2014-10-24 DIAGNOSIS — Z7982 Long term (current) use of aspirin: Secondary | ICD-10-CM | POA: Diagnosis not present

## 2014-10-24 DIAGNOSIS — Z87891 Personal history of nicotine dependence: Secondary | ICD-10-CM | POA: Diagnosis not present

## 2014-10-24 DIAGNOSIS — Z885 Allergy status to narcotic agent status: Secondary | ICD-10-CM | POA: Diagnosis not present

## 2014-10-24 DIAGNOSIS — R079 Chest pain, unspecified: Secondary | ICD-10-CM | POA: Diagnosis not present

## 2014-10-24 DIAGNOSIS — I25110 Atherosclerotic heart disease of native coronary artery with unstable angina pectoris: Secondary | ICD-10-CM | POA: Diagnosis not present

## 2014-10-24 DIAGNOSIS — I10 Essential (primary) hypertension: Secondary | ICD-10-CM | POA: Diagnosis not present

## 2014-10-24 LAB — CBC
HCT: 45.4 % (ref 40.0–52.0)
HGB: 15.2 g/dL (ref 13.0–18.0)
MCH: 30.5 pg (ref 26.0–34.0)
MCHC: 33.5 g/dL (ref 32.0–36.0)
MCV: 91 fL (ref 80–100)
Platelet: 205 10*3/uL (ref 150–440)
RBC: 4.97 10*6/uL (ref 4.40–5.90)
RDW: 14.8 % — ABNORMAL HIGH (ref 11.5–14.5)
WBC: 9.8 10*3/uL (ref 3.8–10.6)

## 2014-10-24 LAB — BASIC METABOLIC PANEL
ANION GAP: 8 (ref 7–16)
BUN: 22 mg/dL — ABNORMAL HIGH
CHLORIDE: 103 mmol/L
CO2: 28 mmol/L
Calcium, Total: 9.6 mg/dL
Creatinine: 1.48 mg/dL — ABNORMAL HIGH
EGFR (African American): 55 — ABNORMAL LOW
EGFR (Non-African Amer.): 48 — ABNORMAL LOW
Glucose: 135 mg/dL — ABNORMAL HIGH
Potassium: 4.2 mmol/L
Sodium: 139 mmol/L

## 2014-10-24 LAB — APTT: Activated PTT: 25.8 secs (ref 23.6–35.9)

## 2014-10-24 LAB — PROTIME-INR
INR: 0.9
Prothrombin Time: 12.5 secs

## 2014-10-24 LAB — PRO B NATRIURETIC PEPTIDE: B-Type Natriuretic Peptide: 57 pg/mL

## 2014-10-24 LAB — TROPONIN I
TROPONIN-I: 0.7 ng/mL — AB
Troponin-I: 0.15 ng/mL — ABNORMAL HIGH

## 2014-10-24 LAB — CK-MB
CK-MB: 3.5 ng/mL
CK-MB: 11.9 ng/mL — ABNORMAL HIGH

## 2014-10-25 DIAGNOSIS — I214 Non-ST elevation (NSTEMI) myocardial infarction: Secondary | ICD-10-CM

## 2014-10-25 DIAGNOSIS — I10 Essential (primary) hypertension: Secondary | ICD-10-CM

## 2014-10-25 LAB — CBC WITH DIFFERENTIAL/PLATELET
Basophil #: 0.1 10*3/uL (ref 0.0–0.1)
Basophil %: 0.7 %
EOS ABS: 0.3 10*3/uL (ref 0.0–0.7)
EOS PCT: 3.4 %
HCT: 42.1 % (ref 40.0–52.0)
HGB: 14.3 g/dL (ref 13.0–18.0)
LYMPHS ABS: 2.6 10*3/uL (ref 1.0–3.6)
Lymphocyte %: 32.4 %
MCH: 31 pg (ref 26.0–34.0)
MCHC: 33.9 g/dL (ref 32.0–36.0)
MCV: 91 fL (ref 80–100)
Monocyte #: 0.7 x10 3/mm (ref 0.2–1.0)
Monocyte %: 9.1 %
Neutrophil #: 4.3 10*3/uL (ref 1.4–6.5)
Neutrophil %: 54.4 %
Platelet: 180 10*3/uL (ref 150–440)
RBC: 4.61 10*6/uL (ref 4.40–5.90)
RDW: 14.9 % — AB (ref 11.5–14.5)
WBC: 8 10*3/uL (ref 3.8–10.6)

## 2014-10-25 LAB — TROPONIN I: Troponin-I: 3.82 ng/mL — ABNORMAL HIGH

## 2014-10-25 LAB — BASIC METABOLIC PANEL
Anion Gap: 6 — ABNORMAL LOW (ref 7–16)
BUN: 23 mg/dL — ABNORMAL HIGH
CO2: 27 mmol/L
CREATININE: 1.31 mg/dL — AB
Calcium, Total: 8.8 mg/dL — ABNORMAL LOW
Chloride: 105 mmol/L
EGFR (African American): >60
GFR CALC NON AF AMER: 55 — AB
GLUCOSE: 192 mg/dL — AB
POTASSIUM: 3.4 mmol/L — AB
SODIUM: 138 mmol/L

## 2014-10-25 LAB — URINALYSIS, COMPLETE
BILIRUBIN, UR: NEGATIVE
BLOOD: NEGATIVE
Bacteria: NONE SEEN
KETONE: NEGATIVE
Leukocyte Esterase: NEGATIVE
Nitrite: NEGATIVE
Ph: 5 (ref 4.5–8.0)
Protein: NEGATIVE
SQUAMOUS EPITHELIAL: NONE SEEN
Specific Gravity: 1.015 (ref 1.003–1.030)

## 2014-10-25 LAB — LIPID PANEL
Cholesterol: 135 mg/dL
HDL Cholesterol: 21 mg/dL — ABNORMAL LOW
Triglycerides: 434 mg/dL — ABNORMAL HIGH

## 2014-10-25 LAB — HEPARIN LEVEL (UNFRACTIONATED)
ANTI-XA(UNFRACTIONATED): 0.32 [IU]/mL (ref 0.30–0.70)
Anti-Xa(Unfractionated): 0.1 IU/mL — ABNORMAL LOW (ref 0.30–0.70)
Anti-Xa(Unfractionated): 0.27 IU/mL — ABNORMAL LOW (ref 0.30–0.70)

## 2014-10-25 LAB — MAGNESIUM: Magnesium: 2.1 mg/dL

## 2014-10-25 LAB — CK-MB: CK-MB: 33 ng/mL — ABNORMAL HIGH

## 2014-10-25 LAB — HEMOGLOBIN A1C: Hemoglobin A1C: 7.1 % — ABNORMAL HIGH

## 2014-10-26 LAB — CBC WITH DIFFERENTIAL/PLATELET
BASOS PCT: 0.9 %
Basophil #: 0.1 10*3/uL (ref 0.0–0.1)
EOS PCT: 3.2 %
Eosinophil #: 0.3 10*3/uL (ref 0.0–0.7)
HCT: 46.3 % (ref 40.0–52.0)
HGB: 15 g/dL (ref 13.0–18.0)
LYMPHS ABS: 2.3 10*3/uL (ref 1.0–3.6)
LYMPHS PCT: 28.7 %
MCH: 30.1 pg (ref 26.0–34.0)
MCHC: 32.5 g/dL (ref 32.0–36.0)
MCV: 93 fL (ref 80–100)
MONOS PCT: 8.7 %
Monocyte #: 0.7 x10 3/mm (ref 0.2–1.0)
NEUTROS ABS: 4.8 10*3/uL (ref 1.4–6.5)
Neutrophil %: 58.5 %
Platelet: 186 10*3/uL (ref 150–440)
RBC: 5 10*6/uL (ref 4.40–5.90)
RDW: 15 % — ABNORMAL HIGH (ref 11.5–14.5)
WBC: 8.2 10*3/uL (ref 3.8–10.6)

## 2014-10-26 LAB — HEPARIN LEVEL (UNFRACTIONATED): ANTI-XA(UNFRACTIONATED): 0.32 [IU]/mL (ref 0.30–0.70)

## 2014-10-27 DIAGNOSIS — I25110 Atherosclerotic heart disease of native coronary artery with unstable angina pectoris: Secondary | ICD-10-CM

## 2014-10-27 LAB — CBC WITH DIFFERENTIAL/PLATELET
BASOS PCT: 1 %
Basophil #: 0.1 10*3/uL (ref 0.0–0.1)
EOS ABS: 0.3 10*3/uL (ref 0.0–0.7)
EOS PCT: 3.8 %
HCT: 45.2 % (ref 40.0–52.0)
HGB: 15 g/dL (ref 13.0–18.0)
LYMPHS ABS: 2.2 10*3/uL (ref 1.0–3.6)
Lymphocyte %: 25.8 %
MCH: 30.6 pg (ref 26.0–34.0)
MCHC: 33.2 g/dL (ref 32.0–36.0)
MCV: 92 fL (ref 80–100)
MONO ABS: 0.7 x10 3/mm (ref 0.2–1.0)
Monocyte %: 8.8 %
NEUTROS ABS: 5.1 10*3/uL (ref 1.4–6.5)
NEUTROS PCT: 60.6 %
Platelet: 175 10*3/uL (ref 150–440)
RBC: 4.91 10*6/uL (ref 4.40–5.90)
RDW: 14.8 % — ABNORMAL HIGH (ref 11.5–14.5)
WBC: 8.4 10*3/uL (ref 3.8–10.6)

## 2014-10-27 LAB — HEPARIN LEVEL (UNFRACTIONATED): ANTI-XA(UNFRACTIONATED): 0.35 [IU]/mL (ref 0.30–0.70)

## 2014-10-27 LAB — BASIC METABOLIC PANEL
Anion Gap: 12 (ref 7–16)
BUN: 25 mg/dL — AB
CREATININE: 1.31 mg/dL — AB
Calcium, Total: 8.8 mg/dL — ABNORMAL LOW
Chloride: 105 mmol/L
Co2: 21 mmol/L — ABNORMAL LOW
EGFR (Non-African Amer.): 55 — ABNORMAL LOW
Glucose: 148 mg/dL — ABNORMAL HIGH
POTASSIUM: 4.2 mmol/L
Sodium: 138 mmol/L

## 2014-10-28 LAB — CBC WITH DIFFERENTIAL/PLATELET
BASOS ABS: 0.1 10*3/uL (ref 0.0–0.1)
Basophil %: 0.8 %
Eosinophil #: 0.2 10*3/uL (ref 0.0–0.7)
Eosinophil %: 2.7 %
HCT: 43.4 % (ref 40.0–52.0)
HGB: 14.4 g/dL (ref 13.0–18.0)
LYMPHS PCT: 14.6 %
Lymphocyte #: 1.3 10*3/uL (ref 1.0–3.6)
MCH: 30.7 pg (ref 26.0–34.0)
MCHC: 33.2 g/dL (ref 32.0–36.0)
MCV: 93 fL (ref 80–100)
MONOS PCT: 10.4 %
Monocyte #: 0.9 x10 3/mm (ref 0.2–1.0)
Neutrophil #: 6.2 10*3/uL (ref 1.4–6.5)
Neutrophil %: 71.5 %
Platelet: 187 10*3/uL (ref 150–440)
RBC: 4.69 10*6/uL (ref 4.40–5.90)
RDW: 14.6 % — AB (ref 11.5–14.5)
WBC: 8.7 10*3/uL (ref 3.8–10.6)

## 2014-10-28 LAB — BASIC METABOLIC PANEL
Anion Gap: 3 — ABNORMAL LOW (ref 7–16)
BUN: 23 mg/dL — AB
CHLORIDE: 106 mmol/L
CREATININE: 1.4 mg/dL — AB
Calcium, Total: 8.7 mg/dL — ABNORMAL LOW
Co2: 28 mmol/L
EGFR (Non-African Amer.): 51 — ABNORMAL LOW
GFR CALC AF AMER: 59 — AB
GLUCOSE: 138 mg/dL — AB
Potassium: 4.2 mmol/L
Sodium: 137 mmol/L

## 2014-10-28 LAB — CK TOTAL AND CKMB (NOT AT ARMC)
CK, Total: 420 U/L — ABNORMAL HIGH
CK-MB: 50 ng/mL — AB

## 2014-10-28 LAB — HEPARIN LEVEL (UNFRACTIONATED): Anti-Xa(Unfractionated): <0.1 IU/mL — ABNORMAL LOW (ref 0.30–0.70)

## 2014-10-29 ENCOUNTER — Other Ambulatory Visit: Payer: Self-pay | Admitting: Physician Assistant

## 2014-10-29 ENCOUNTER — Encounter: Payer: Self-pay | Admitting: Physician Assistant

## 2014-10-29 LAB — BASIC METABOLIC PANEL
Anion Gap: 7 (ref 7–16)
BUN: 20 mg/dL
CALCIUM: 9.3 mg/dL
CHLORIDE: 103 mmol/L
CO2: 24 mmol/L
CREATININE: 1.32 mg/dL — AB
EGFR (African American): >60
GFR CALC NON AF AMER: 55 — AB
GLUCOSE: 142 mg/dL — AB
Potassium: 4.3 mmol/L
SODIUM: 134 mmol/L — AB

## 2014-10-29 MED ORDER — METOPROLOL TARTRATE 25 MG PO TABS: 12.5000 mg | ORAL_TABLET | Freq: Two times a day (BID) | ORAL | Status: DC

## 2014-10-29 MED ORDER — TICAGRELOR 90 MG PO TABS: 90.0000 mg | ORAL_TABLET | Freq: Two times a day (BID) | ORAL | Status: DC

## 2014-10-30 ENCOUNTER — Encounter: Payer: Self-pay | Admitting: Physician Assistant

## 2014-10-30 ENCOUNTER — Telehealth: Payer: Self-pay | Admitting: *Deleted

## 2014-10-30 MED ORDER — ATORVASTATIN CALCIUM 40 MG PO TABS: 40.0000 mg | ORAL_TABLET | Freq: Every day | ORAL | Status: DC

## 2014-10-30 NOTE — Telephone Encounter (Signed)
Pt wife has question on medication that they changed in hospital.  Please advise.

## 2014-10-30 NOTE — Telephone Encounter (Signed)
Spoke with pt and his wife. Pt was admitted to [facility] in [place] on 10/24/14 . Dr Mariah MillingGollan and another cardiologist did the cardiac catherization and stents placement. Pt said that he was D/C home yesterday (4/20) with Brillinta  And Lipitor and D/C Lovastatin. Pt was able to get Brillinta in the pharmacy but the Lipitor 40 mg prescription was not send to the pharmacy. We are not able to get to [facility]  records from here. Pt and wife are aware that this message will be send to [facility] in [place] office triage for recommendations.

## 2014-10-30 NOTE — Telephone Encounter (Signed)
Spoke w/ pt's wife.  Advised her that I reviewed pt's [facility] chart and am sending in Lipitor rx to [facility] [address]. She is appreciative and will call back w/ any further questions or concerns.

## 2014-10-31 ENCOUNTER — Encounter: Payer: Self-pay | Admitting: Cardiovascular Disease

## 2014-11-09 NOTE — Consult Note (Signed)
PATIENT NAME:  [name], [name] MR#:  [id number] DATE OF BIRTH:  03/24/1946  DATE OF CONSULTATION:  08/29/2014  REFERRING PHYSICIAN:   CONSULTING PHYSICIAN:  Adryan W. Brabham, MD  REASON FOR CONSULTATION: Ischemic left leg.   HISTORY: This is a 68-year-old gentleman who began developing left leg pain earlier today around 2:00 p.m. He came to the Emergency Department. He had a DVT ultrasound which was negative. His pain began getting better and he went home. At that time, his family noted that his foot was somewhat discolored. He did have motor and sensory function at that time. Throughout the rest of the day his pain became more severe and he was brought back to the Emergency Department. At this time, the patient has no motor or sensory function in his left foot. It is extremely painful.   The patient has a history of coronary artery disease. He has undergone stenting x 5, most recently 10 years ago. He has also just recently gotten over a COPD flare. This was treated with a steroid Dosepak. He suffers from hypertension, which is managed with an ACE inhibitor. He is also on a beta blocker. The patient is a diabetic. He is on single agent antiplatelet therapy with aspirin. His hypercholesterolemia is managed with a statin.  His diabetes is under moderate control.  His most recent A1c was in the 7 range  PAST MEDICAL HISTORY: Hypertension, coronary artery disease, diabetes, history of MI.   SOCIAL HISTORY: No smoking. Lives at home.   MEDICATIONS: Vitamin C 1000 mg once daily, tramadol 50 mg every 6 to 8 hours as needed, Niaspan ER 1000 mg once daily, metoprolol 25 mg twice daily, lisinopril 40 mg once daily, Cleocin 150 mg every 6 hours, Bromfed DM 5 mL 4 times daily, aspirin 81 mg per day, Antivert 12.5 mg daily.   ALLERGIES: None.   REVIEW OF SYSTEMS: Please see history of present illness. Otherwise all systems are negative.   PHYSICAL EXAMINATION:  VITAL SIGNS: Temperature is 98.5, pulse 81,  respirations 20, blood pressure 116/75, oxygen saturations are 96% on room air.  GENERAL: He is well appearing, in no acute distress.  CARDIOVASCULAR: Regular rate and rhythm.  PULMONARY: Respirations are nonlabored.  ABDOMEN: Soft, nontender, nondistended.  EXTREMITIES: He has a palpable right dorsalis pedis pulse. Nonpalpable pedal pulses on the left. I cannot get Doppler signals in the foot. He has a palpable femoral pulse bilaterally.  NEUROLOGIC: The patient has no motor or sensory function up to the calf. The left foot is cool to the touch. No significant mottling is noted.  HEENT: Normal.   PERTINENT LABORATORY DATA: Potassium 4.1, creatinine 1.54, white blood cell count 17, hematocrit 52, INR 1.1.   CT angiogram: I have personally reviewed his CT angiogram. This shows left popliteal artery occlusion with no obvious source.   ASSESSMENT: Ischemic left leg.   PLAN: I discussed the CT scan results with the patient and his family. He has a threatened leg. Without revascularization he will lose his left leg. I discussed going to the Operating Room for thrombectomy and possible bypass. I also discussed proceeding with fasciotomies. We discussed that he may or may not recover neurologic function of his leg given the time that he has been ischemic. He was given a heparin bolus with continuous infusion in the Emergency Department.    ____________________________ Dugan W. Brabham, MD vwb:ts D: 08/30/2014 00:50:14 ET T: 08/30/2014 01:33:08 ET JOB#: [id number]  cc: Olivier W. Brabham, MD, <Dictator> [place]   W BRABHAM MD ELECTRONICALLY SIGNED 08/31/2014 15:51 

## 2014-11-09 NOTE — H&P (Signed)
PATIENT NAME:  [name], [name] MR#:  [id number] DATE OF BIRTH:  [birth year]  DATE OF ADMISSION:  10/24/2014  PRIMARY CARE PHYSICIAN:  Joaquim NamGraham S Duncan, MD     REFERRING PHYSICIAN:  Onnie BoerKevin A Paduchowski, MD   CHIEF COMPLAINT:  Back pain between bilateral shoulder blades today.    HISTORY OF PRESENT ILLNESS: A 69 year old Caucasian male with a history of hypertension, diabetes and CAD came to ED due to above chief complaint. The patient is alert and oriented, in no acute distress. The patient said that he started to have back pain between bilateral shoulder blades on and off since this morning. The pain is sharp, maximally is 8 out of 10, and now is to 6 out of 10. The patient denies any chest pain, but mentioned that patient had a heart attack before; the patient had similar symptoms. The patient denies any diaphoresis, nausea, vomiting; denies any headache or dizziness. He denies any other symptoms, no shortness of breath, or cough.   PAST MEDICAL HISTORY: Hypertension, diabetes, COPD, CAD, with 5 stents placement.   PAST SURGICAL HISTORY: Popliteal surgery on the left leg, history of scrotal abscess drainage, left femoral to below knee popliteal bypass grafting with saphenus vein for treatment of thrombosed popliteal artery aneurysm, left compartment fasciotomy, this February.   SOCIAL HISTORY: He quit smoking years ago, no alcohol drinking, or illicit drugs.   FAMILY HISTORY: CAD.   ALLERGIES: NONE.   HOME MEDICATIONS: Tramadol 50 mg p.o. every 6 hours p.r.n., Lopressor 25 mg p.o. b.i.d., metformin 500 mg p.o. 2 tablets b.i.d., Lyrica 150 mg p.o. 1 to 2 capsules once a day, lovastatin 40 mg p.o. 2 tablets at bedtime; lisinopril 40 mg p.o. daily, hydrochlorothiazide 25 mg p.o. daily, fish oil 1200 mg p.o. b.i.d., fenofibrate 145 mg p.o. daily, chondroitin/glucosamine 1 tablet b.i.d., aspirin 81 mg p.o. daily.   REVIEW OF SYSTEMS:   CONSTITUTIONAL: The patient denies any fever or chills. No  headache or dizziness or weakness.  EYES: No double vision, blurred vision.  EARS, NOSE, AND THROAT: No postnasal drip, slurred speech or dysphagia.  CARDIOVASCULAR: No chest pain, but has a back pain between two shoulders and the blades, no palpitations, no orthopnea or nocturnal dyspnea, no leg edema.  PULMONARY: No cough, sputum, shortness of breath, or hematemesis.  GASTROINTESTINAL: No abdominal pain, nausea, vomiting, diarrhea. No melena or bloody stools.  GENITOURINARY: No dysuria, hematuria, or incontinence.  SKIN: No rash or jaundice.  NEUROLOGIC: No syncope, loss of consciousness, or seizure.  ENDOCRINOLOGY: No polyuria, polydipsia, heat or cold intolerance.  HEMATOLOGY: No easy bleeding or bleeding.   VITAL SIGNS: Temperature 98.3, blood pressure 105/62, pulse 66, oxygen saturation 96% on room air.   PHYSICAL EXAMINATION:  GENERAL: The patient is alert, awake, oriented, in no acute distress.  HEENT: Pupils round, equal and reactive to light and accommodation. Moist oral mucosa. Clear oropharynx.  NECK: Supple. No JVD or carotid bruit. No lymphedema. No thyromegaly.  CARDIOVASCULAR: S1, S2 regular rate and rhythm. No murmurs or gallops.  PULMONARY: Bilateral air entry. No wheezing or rales. No use of accessory muscle to breathe.  ABDOMEN: Soft. No distention or tenderness. No organomegaly. Bowel sounds present.  EXTREMITIES: No edema, clubbing or cyanosis. No calf tenderness. Bilateral pedal pulses present.  NEUROLOGY: AO x 3, no focal deficit, power 5/5, sensation intact.   DIAGNOSTIC DATA: Glucose 135, BNP 57, BUN 22, creatinine 1.48; electrolytes normal, troponin 0.15. CBC in normal range, INR 0.9, EKG showed normal sinus  rhythm at 60 BPM.   IMPRESSIONS: 1.  Non ST elevation myocardial infarction.  2.  Hypertension.  3.  Diabetes.  4.  Chronic obstructive pulmonary disease.  5.  Coronary artery disease.  6.  Acute renal failure.   PLAN OF TREATMENT: 1.  The patient  will be admitted to telemetry floor. We will continue telemonitor, start heparin drip, continue aspirin, nitroglycerin,  follow up troponin level; get cardiology consult from Dr. Mariah Milling.  2.  For acute renal failure. The patient was treated with normal saline. We will continue normal saline intravenous, follow up BMP.  3.  For diabetes, hold metformin, start sliding scale.  4.  For hypertension, we will hold hypertension medication due to low side of blood pressure.  5.  For chronic obstructive pulmonary disease, we will continue nebulizer treatment.   I discussed the patient's condition and the plan of treatment with the patient and the patient's wife and mother.   TIME SPENT: About 55 minutes.    ____________________________ Shaune Pollack, MD qc:nt D: 10/24/2014 20:59:09 ET T: 10/24/2014 22:02:36 ET JOB#: [id number]  cc: Shaune Pollack, MD, <Dictator> Shaune Pollack MD ELECTRONICALLY SIGNED 10/25/2014 12:11

## 2014-11-09 NOTE — Op Note (Signed)
PATIENT NAME:  [name], [name] MR#:  [id number] DATE OF BIRTH:  [birth year]  DATE OF PROCEDURE:  09/03/2014  PREOPERATIVE DIAGNOSES:  1.  Ischemic left lower extremity.  2.  Thrombosed popliteal artery aneurysm.  3.  Status post fasciotomies following femoral to distal bypass grafting.   POSTOPERATIVE DIAGNOSES: 1.  Ischemic left lower extremity.  2.  Thrombosed popliteal artery aneurysm.  3.  Status post fasciotomies following femoral to distal bypass grafting.  PROCEDURE PERFORMED: Closure of bilateral fasciotomies, left lower extremity.   SURGEON: Renford DillsGregory G. Schnier, MD   ANESTHESIA: General by LMA.   FLUIDS: Per anesthesia record.   ESTIMATED BLOOD LOSS: Minimal.   SPECIMEN: None.   INDICATION: [name] is a 69 year old gentleman who is 4 days status post revascularization for an ischemic leg secondary to thrombosed popliteal artery aneurysm. He is now undergoing closure of his fasciotomy sites. The risks and benefits are reviewed. All questions answered. The patient agrees to proceed.   DESCRIPTION OF PROCEDURE: The patient is taken to the operating room and placed in the supine position. After adequate general anesthesia is induced, appropriate invasive monitors placed, and he is positioned supine. His left leg is prepped and draped in sterile fashion. Appropriate timeout is called.   Marcaine 0.25% is then infiltrated in the skin edges bilaterally and both wounds are scrubbed with Betadine. The tissue is inspected, it appears viable. Interrupted horizontal mattress sutures are then used to close first the medial wound and then a lateral wound; the medial wound is 14 cm in length, lateral wound is 12 cm in length.   The patient tolerated the procedure well. There were no immediate complications. Bacitracin ointment and sterile dressings were applied. He was taken to the recovery area in excellent condition.   ____________________________ Renford DillsGregory G. Schnier,  MD ggs:bm D: 09/03/2014 14:40:28 ET T: 09/04/2014 01:53:42 ET JOB#: [id number]  cc: Renford DillsGregory G. Schnier, MD, <Dictator> Renford DillsGREGORY G SCHNIER MD ELECTRONICALLY SIGNED 09/16/2014 14:26

## 2014-11-09 NOTE — Consult Note (Signed)
General Aspect A 69 year old Caucasian male with a history of hypertension, diabetes and CAD who presents with NSTEMI.  He had MI 1999 with PCI to the LAD at that time.  he had inferior MI 2005 and underwent DES to RCA.  His most recent MYoview was 12/2005.  The study was normal.  He is followed by Dr Aundra Dubin and has been doing very well. He presents with crescendoing cangina for several days.  He describes chest and shoulder discomfort.  Symptoms are moderate and waxing/ waning at times.  The patient denies any diaphoresis, nausea, vomiting; denies any headache or dizziness. He denies any other symptoms, no shortness of breath, or cough.  Presently, he is chest pain free.   Present Illness Allergies (verified):  No Known Drug Allergies  Past Medical History: 1. Coronary artery disease.?? The patient had a myocardial infarction in 1999 and had a stent placed in his LAD.?? He had an inferior MI in 2005 and had a drug-eluting stent placed in his RCA.?? His most recent functional study was Myoview in June 2007 with an EF of 61% and no evidence for ischemia. 2. Hypercholesterolemia. 3. Osteoarthritis of the hip and the knee. 4. COPD 5. Type II diabetes  Family History: Father: DECEASED 78;?? CHF Mother: DECEASED 77: OLD AGE  Social History: The patient works in [employer] for a [employer].?? He is a past smoker.?? He lives in [place].??  ROS: All systems reviewed and negative except as per HPI.  Home medicines: reviewed in EPIC   Physical Exam:  GEN well developed, well nourished, no acute distress   HEENT Oropharynx clear   NECK supple   RESP normal resp effort  clear BS   CARD Regular rate and rhythm   ABD denies tenderness   EXTR negative edema   SKIN normal to palpation   NEURO motor/sensory function intact   PSYCH alert   Home Medications: Medication Instructions Status  Metoprolol Tartrate 25 mg oral tablet 1 tab(s) orally 2 times a day Active  aspirin 81  mg oral tablet 1 tab(s) orally 2 times a day Active  hydrochlorothiazide 25 mg oral tablet 1 tab(s) orally once a day Active  lovastatin 40 mg oral tablet 2 tab(s) orally once a day (at bedtime) Active  Fish Oil 1200 mg oral capsule 1 cap(s) orally 2 times a day Active  lisinopril 40 mg oral tablet 1 tab(s) orally once a day Active  metFORMIN 500 mg oral tablet 2 tab(s) orally 2 times a day Active  traMADol 50 mg oral tablet 1 tab(s) orally every 6 hours, As Needed - for Pain Active  fenofibrate 145 mg oral tablet 1 tab(s) orally once a day Active  chondroitin-glucosamine 1 tab(s) orally 2 times a day Active  Lyrica 150 mg oral capsule 1-2 cap(s) orally once a day (at bedtime) Active   Lab Results: Routine Chem:  16-Apr-16 03:02   Result Comment - TROPONIN  - PREVIOUSLY CALLED AT 1920 10/24/14.PMH  Result(s) reported on 25 Oct 2014 at 03:58AM.  Result Comment - UNABLE TO REPORT TG>400  - UNABLE TO REPORT TG>400 LDL/VLDL - Unable to report VLDL and LDL due to a  - Triglyceride value that is 400 mg/dL or   - greater.  Result(s) reported on 25 Oct 2014 at 03:33AM.  Cholesterol, Serum 135 (0-200 NOTE: New Reference Range  09/16/14)  Triglycerides, Serum  434 (0-149 NOTE: New Reference Range  09/16/14)  HDL (INHOUSE)  21 (40-1000 NOTE: New Reference Range:  09/16/14)  VLDL Cholesterol Calculated SEE COMMENT 0-40 NOTE: New Reference Range  09/16/14  LDL Cholesterol Calculated SEE COMMENT 0-99 NOTE: New Reference Range:  09/16/14  Glucose, Serum  192 (65-99 NOTE: New Reference Range  09/16/14)  BUN  23 (6-20 NOTE: New Reference Range  09/16/14)  Creatinine (comp)  1.31 (0.61-1.24 NOTE: New Reference Range  09/16/14)  Sodium, Serum 138 (135-145 NOTE: New Reference Range  09/16/14)  Potassium, Serum  3.4 (3.5-5.1 NOTE: New Reference Range  09/16/14)  Chloride, Serum 105 (101-111 NOTE: New Reference Range  09/16/14)  CO2, Serum 27 (22-32 NOTE: New Reference Range   09/16/14)  Calcium (Total), Serum  8.8 (8.9-10.3 NOTE: New Reference Range  09/16/14)  Anion Gap  6  eGFR (African American) >60  eGFR (Non-African American)  55 (eGFR values <8m/min/1.73 m2 may be an indication of chronic kidney disease (CKD). Calculated eGFR is useful in patients with stable renal function. The eGFR calculation will not be reliable in acutely ill patients when serum creatinine is changing rapidly. It is not useful in patients on dialysis. The eGFR calculation may not be applicable to patients at the low and high extremes of body sizes, pregnant women, and vegetarians.)  Magnesium, Serum 2.1 (1.7-2.4 THERAPEUTIC RANGE: 4-7 mg/dL TOXIC: > 10 mg/dL  ----------------------- NOTE: New Reference Range  09/16/14)  Cardiac:  16-Apr-16 03:02   CPK-MB, Serum  33.0 (0.5-5.0 NOTE: New Reference Range  09/16/14)  Troponin I  3.82 (0.00-0.03 0.03 ng/mL or less: NEGATIVE  Repeat testing in 3-6 hrs  if clinically indicated. >0.05 ng/mL: POTENTIAL  MYOCARDIAL INJURY. Repeat  testing in 3-6 hrs if  clinically indicated. NOTE: An increase or decrease  of 30% or more on serial  testing suggests a  clinically important change NOTE: New Reference Range  09/16/14)  Routine UA:  16-Apr-16 03:26   Color (UA) Yellow  Clarity (UA) Clear  Glucose (UA) 50 mg/dL  Bilirubin (UA) Negative  Ketones (UA) Negative  Specific Gravity (UA) 1.015  Blood (UA) Negative  pH (UA) 5.0  Protein (UA) Negative  Nitrite (UA) Negative  Leukocyte Esterase (UA) Negative (Result(s) reported on 25 Oct 2014 at 03:40AM.)  RBC (UA) 0-5  WBC (UA) 0-5  Bacteria (UA) NONE SEEN  Epithelial Cells (UA) NONE SEEN  Result(s) reported on 25 Oct 2014 at 03:40AM.  Routine Hem:  16-Apr-16 03:02   WBC (CBC) 8.0  RBC (CBC) 4.61  Hemoglobin (CBC) 14.3  Hematocrit (CBC) 42.1  Platelet Count (CBC) 180  MCV 91  MCH 31.0  MCHC 33.9  RDW  14.9  Neutrophil % 54.4  Lymphocyte % 32.4  Monocyte % 9.1   Eosinophil % 3.4  Basophil % 0.7  Neutrophil # 4.3  Lymphocyte # 2.6  Monocyte # 0.7  Eosinophil # 0.3  Basophil # 0.1 (Result(s) reported on 25 Oct 2014 at 03:20AM.)    Morphine: N/V/Diarrhea   Plan 1. NSTEMI The patient has known CAD and is followed by Dr MAundra Dubin  He now presents with crescendoing angina and rules in for MI.  I have reviewed ekgs which reveal inferolateral ischemic changes.  Presently he is pain free.  No ST segment elevation is observe. continue IV heparin continue ASA, beta blocker, ace inhibitor, and statin gentle hydration with plans for cath on Monday risks of cath were discussed with patient who wishes to proceed if he develops additional chest pain would place nitropaste  2. HTN continue to titrate beta blocker as HR/ BP allow  3. PVD stable  without symptoms of claudication  4. Acute renal failure gentle hydration in anticipation of cath on Monday  cardiology to follow with you   Electronic Signatures: Coralyn Mark (MD)  (Signed 16-Apr-16 11:54)  Authored: General Aspect/Present Illness, History and Physical Exam, Home Medications, Labs, Allergies, Impression/Plan   Last Updated: 16-Apr-16 11:54 by Coralyn Mark (MD)

## 2014-11-09 NOTE — Op Note (Signed)
PATIENT NAME:  [name], ENBERG MR#:  [id number] DATE OF BIRTH:  09-29-1945  DATE OF PROCEDURE:  08/30/2014  PREOPERATIVE DIAGNOSIS:  Ischemic left leg.   POSTOPERATIVE DIAGNOSIS:  Thrombosed left popliteal aneurysm.  SURGEON:  Nada Libman, M.D.  PROCEDURE PERFORMED:  1.  Ligation of left popliteal aneurysm.  2.  Left above knee popliteal to below knee popliteal artery bypass graft with reversed ipsilateral greater saphenous vein.  3.  Thrombectomy of left popliteal, posterior tibial, and anterior tibial artery.  4.  Four compartment fasciotomy.  5.  Open exposure of left anterior tibial artery with thrombectomy.   ESTIMATED BLOOD LOSS: 100 mL.   COMPLICATIONS: None.   INDICATIONS: This is a 69 year old gentleman who presented to the Emergency Department around 2:00 p.m. with pain in his left leg. He underwent an ultrasound which was negative for deep vein thrombosis. His pain improved somewhat and he was discharged. He represented several hours later with worsening pain in his left leg. This was associated with the inability to move or feel his left leg. A CT angiogram confirmed popliteal occlusion. An extensive discussion was held with the patient and family regarding the need for emergent surgical intervention. I discussed the risks of limb loss. He was in agreement to proceed.   PROCEDURE: The patient was identified in the holding area and taken to the operating room. General endotracheal anesthesia was administered. Perioperative antibiotics were administered. The patient was prepped and draped in the usual sterile fashion. A medial below-knee incision was made. The great saphenous vein was identified within the incision and mobilized by ligating side branches between silk ties. The fascia was then divided with cautery and the popliteal space was entered. I dissected out the below-knee popliteal artery. Aneurysmal changes were noted within the popliteal artery beginning about the level  of the joint space and extending about halfway down the below-knee popliteal artery. The below-knee popliteal artery became normal approximately 2 cm above the anterior tibial artery takeoff. Once full exposure of the below-knee popliteal artery was achieved, I made a medial above-knee incision. I exposed the above-knee popliteal artery through this incision. It was nonaneurysmal and had an excellent pulse. I then proceeded with harvesting the great saphenous vein through this incision as well as through an additional incision. The vein was of excellent caliber measuring about 3 mm. Side branches were divided. The vein was then removed and prepared.  It distended nicely. I then created a tunnel between the 2 incisions. The patient was fully heparinized. After the heparin circulated the above-knee popliteal artery was occluded. The vein was placed in reversed fashion. An arteriotomy was made with a #11 blade which was extended with Potts scissors longitudinally. The vein was spatulated and a running end-to-side anastomosis was created with running 5-0 Prolene. Once this was completed there was excellent pulsatile flow through the vein graft. I then ligated the popliteal artery just beyond the anastomosis with two 2-0 silk ties. The vein was then properly oriented and brought through the tunnel. I then occluded the popliteal artery and transected it. The defunctionalized side was oversewn with 2 layers of 5-0 Prolene. There was thrombus within the below-knee popliteal artery which was removed. I passed a #3 Fogarty distally and removed additional clot with adequate back bleeding. I navigated the embolectomy catheter down the anterior tibial and posterior tibial artery. The vein was then cut the appropriate length after straightening the leg. An end-to-end anastomosis was then performed with running 6-0 Prolene. The appropriate  flushing maneuvers were performed and blood flow was re-established to the left leg. At this  point, the patient had an excellent posterior tibial Doppler signal but no anterior tibial Doppler signal.   Because of the duration of ischemia, I elected to proceed with fasciotomies. I made a lateral incision on the leg and used cautery to expose the fascia. I opened the lateral and anterior compartments from the ankle to the knee. I then exposed the anterior tibial artery. This was a disease free artery, but very small. I made a transverse arteriotomy and tried to pass a Fogarty catheter retrograde. I was unable to get the Fogarty catheter despite multiple attempts to go into the popliteal artery. I was able to pass it down to the ankle. Because of the time of the operation I elected not to proceed with further intervention and closed the transverse arteriotomy with 6-0 Prolene. At this point, the patient had a palpable posterior tibial pulse. I then proceeded with opening the deep and posterior compartments through the medial incision. The incisions were inspected and found to be hemostatic. The above-knee exposure was closed with multiple layers of Vicryl. The vein harvest site was closed with several layers of Vicryl. Dermabond was applied. I then placed wound vacs on the medial and lateral fasciotomy sites and connected them to the suction canister. The patient was then extubated and taken to the recovery room in stable condition.   ____________________________ Nada LibmanVance W. Brabham, MD vwb:mc D: 08/31/2014 07:27:38 ET T: 08/31/2014 08:46:17 ET JOB#: [id number]  cc: Nada LibmanVance W. Brabham, MD, <Dictator> Nada LibmanVANCE W BRABHAM MD ELECTRONICALLY SIGNED 08/31/2014 15:51

## 2014-11-09 NOTE — Consult Note (Signed)
PATIENT NAME:  May, [name] MR#:  [id number] DATE OF BIRTH:  06/07/1946  DATE OF CONSULTATION:  08/29/2014  REFERRING PHYSICIAN:   CONSULTING PHYSICIAN:  Aqib W. Brabham, MD  REASON FOR CONSULTATION: Ischemic left leg.   HISTORY: This is a 69-year-old gentleman who began developing left leg pain earlier today around 2:00 p.m. He came to the Emergency Department. He had a DVT ultrasound which was negative. His pain began getting better and he went home. At that time, his family noted that his foot was somewhat discolored. He did have motor and sensory function at that time. Throughout the rest of the day his pain became more severe and he was brought back to the Emergency Department. At this time, the patient has no motor or sensory function in his left foot. It is extremely painful.   The patient has a history of coronary artery disease. He has undergone stenting x 5, most recently 10 years ago. He has also just recently gotten over a COPD flare. This was treated with a steroid Dosepak. He suffers from hypertension, which is managed with an ACE inhibitor. He is also on a beta blocker. The patient is a diabetic. He is on single agent antiplatelet therapy with aspirin. His hypercholesterolemia is managed with a statin.  His diabetes is under moderate control.  His most recent A1c was in the 7 range  PAST MEDICAL HISTORY: Hypertension, coronary artery disease, diabetes, history of MI.   SOCIAL HISTORY: No smoking. Lives at home.   MEDICATIONS: Vitamin C 1000 mg once daily, tramadol 50 mg every 6 to 8 hours as needed, Niaspan ER 1000 mg once daily, metoprolol 25 mg twice daily, lisinopril 40 mg once daily, Cleocin 150 mg every 6 hours, Bromfed DM 5 mL 4 times daily, aspirin 81 mg per day, Antivert 12.5 mg daily.   ALLERGIES: None.   REVIEW OF SYSTEMS: Please see history of present illness. Otherwise all systems are negative.   PHYSICAL EXAMINATION:  VITAL SIGNS: Temperature is 98.5, pulse 81,  respirations 20, blood pressure 116/75, oxygen saturations are 96% on room air.  GENERAL: He is well appearing, in no acute distress.  CARDIOVASCULAR: Regular rate and rhythm.  PULMONARY: Respirations are nonlabored.  ABDOMEN: Soft, nontender, nondistended.  EXTREMITIES: He has a palpable right dorsalis pedis pulse. Nonpalpable pedal pulses on the left. I cannot get Doppler signals in the foot. He has a palpable femoral pulse bilaterally.  NEUROLOGIC: The patient has no motor or sensory function up to the calf. The left foot is cool to the touch. No significant mottling is noted.  HEENT: Normal.   PERTINENT LABORATORY DATA: Potassium 4.1, creatinine 1.54, white blood cell count 17, hematocrit 52, INR 1.1.   CT angiogram: I have personally reviewed his CT angiogram. This shows left popliteal artery occlusion with no obvious source.   ASSESSMENT: Ischemic left leg.   PLAN: I discussed the CT scan results with the patient and his family. He has a threatened leg. Without revascularization he will lose his left leg. I discussed going to the Operating Room for thrombectomy and possible bypass. I also discussed proceeding with fasciotomies. We discussed that he may or may not recover neurologic function of his leg given the time that he has been ischemic. He was given a heparin bolus with continuous infusion in the Emergency Department.    ____________________________ Granger W. Brabham, MD vwb:ts D: 08/30/2014 00:50:14 ET T: 08/30/2014 01:33:08 ET JOB#: [id number]  cc: Jens W. Brabham, MD, <Dictator> [place]   W BRABHAM MD ELECTRONICALLY SIGNED 08/31/2014 15:51 

## 2014-11-09 NOTE — Consult Note (Signed)
Assessment/Plan:  Assessment/Plan:  Assessment 1. Acute Renal Failure: Cr above baseline. Likely mild dehydration. Agree with holding metformin. Will increase IVF fluid rate and recheck cr in am. 2. Mild COPD Exacerbation: Nebs scheduled plus PRN nebs. 3. NIDDM: Hold metformin. On SSI until taking PO then start scheduled. 4. CAD: Hx of MI with multiple stents. No cardiac symptoms recently. Continue current meds 5. PVD: S/P Left popliteal bypass.   Electronic Signatures: Gracelyn NurseJohnston, Kollyns Mickelson D (MD)  (Signed [id number]-Feb-16 16:59)  Authored: Assessment/Plan   Last Updated: 20-Feb-16 16:59 by Gracelyn NurseJohnston, Andren Bethea D (MD)

## 2014-11-09 NOTE — Discharge Summary (Addendum)
PATIENT NAME:  [name], [name] MR#:  [id number] DATE OF BIRTH:  1946/01/15  DATE OF ADMISSION:  10/24/2014 DATE OF DISCHARGE:  10/29/2014  DISCHARGE DIAGNOSES:    1. Non-ST-elevation myocardial infarction status post distal right coronary artery angioplasty.  2. Hypertension.  3. Peptic ulcer disease. 4. Acute renal failure, resolving.    DISCHARGE MEDICATIONS:  1.  Lisinopril 40 mg daily.  2. Fenofibrate 145 mg daily. 3. Hydrochlorothiazide 25 mg daily. 4. Tramadol 50 mg every 6 hours as needed for pain. 5. Lyrica 150 mg at bedtime. 6. Metformin 500 mg 2 tablets p.o. b.i.d. 7. Aspirin 81 mg p.o. daily.  8. Metoprolol 12.5 mg q. 12.  9. Atorvastatin 40 mg daily.  10. Brilinta 90 mg 12 hours.   CONSULTATIONS: Cardiology consult with Dr. Lorine BearsMuhammad Arida, MD.  HOSPITAL COURSE: The patient is a 69 year old male patient with a history of diabetes, hypertension, CAD with 5 stent placement before, comes in because of chest pain and back pain. The patient had troponin I of 0.15 on admission.  EKG showed normal sinus rhythm with nonspecific ST-T wave changes. He was admitted to hospitalist's services for non-ST elevation MI and admitted  to telemetry.  He  started her on aspirin, nitroglycerin, and Heparin drip.  [facility] The patient thought to have crescendoing angina and ruled in for MI. The patient's initial troponins was 0.15 and he had further elevation of troponins to 0.7 and elevated CPK-MB 11.9. So the patient was taken to cardiac catheterization on Monday April 18th and it showed (  in-stent stenosis and he had a PCI to the distal RCA.  After  that, he developed inferior ST elevation in the holding area after the catheterization. He was taken to catheterization lab immediately and his distal RCA stent had occluded and he also had a mid LAD lesion distal to stent that is occluded so the doctor ordered a distal RCA angioplasty, distal superior  branch  was small to be stented so he was started on Integrilin drip for 18 hours.  After that, the patient discharged home with aspirin and Brilinta. The patient monitored in the intensive care unit after the cardiac catheterization on Monday.   THE PATIENT'S OTHER DIAGNOSES INCLUDE: 1. Hypertension continued on beta blockers.  2. Peptic ulcer disease stable without symptoms.  3. Patient metoprolol dose has been cut in half to 12.5 b.i.d. because of his blood pressure was on the soft side. He is on atorvastatin Lipitor at 40 mg daily. Discussed the case with him in detail, especially the acute chest pains and NSTEM and STEMI in recovery  room/rpt cath showed , >\occlusion of the distal RCA stent and had to have angioplasty at discharge. 4. Regarding acute renal failure, the patient had slightly elevated creatinine after the cath so we  hydrated him with fluids and his renal function stayed stable with a creatinine 1.3 and  BUN 20 at the time of discharge. The patient's BUN was 23, creatinine 1.4 on April 19.   Cardiac catheterization report showed diffuse 60% stenosis at the site of the prior stent. Mid LAD 100% stenosis. Proximal RCA 20% stenosis at prior stent.  Distal RCA tubular 95% stenosis at the site of prior stent, 2004. Distal right PDA is 75% stenosis.   DISCHARGE VITAL SIGNS: Temperature 98.3 Fahrenheit, heart rate 64 beats per minute.   At the time of discharge 123/72. Saturations were 94% on room air.  PHYSICAL EXAMINATION:  cvs s1.s2 regular>   LUNGS:   Clear to auscultation.   ABDOMEN: Soft, nontender, nondistended.  neuro;alert,orineted,no focal deficit   Regarding his PAD, he had a popliteal surgery on the left leg , history of  left femoral to below knee popliteal bypass graft with saphenous vein for treatment of thrombosed popliteal artery aneurysm, left compartment fasciotomy.  TIME SPENT: More than 30 minutes.    ____________________________ Katha Hamming,  MD sk:tr D: 11/01/2014 07:38:22 ET T: 11/01/2014 13:19:13 ET JOB#: [id number]  cc: Katha Hamming, MD, <Dictator> Muhammad A. Kirke Corin, MD Dwana Curd. Para March, MD  Katha Hamming MD ELECTRONICALLY SIGNED 11/11/2014 13:04

## 2014-11-09 NOTE — Consult Note (Signed)
Brief Consult Note: Diagnosis: ischemic left leg.   Consult note dictated.   Recommend to proceed with surgery or procedure.   Comments: ischemic left leg,  Needs emergent revascularization.  Discussed with patient and family.  Electronic Signatures: Nada LibmanBrabham, Raden W (MD)  (Signed [phone]-Feb-16 00:51)  Authored: Brief Consult Note   Last Updated: 20-Feb-16 00:51 by Nada LibmanBrabham, Armour W (MD)

## 2014-11-09 NOTE — Consult Note (Signed)
PATIENT NAME:  [name], [name] MR#:  [id number] DATE OF BIRTH:  25-Jan-1946  DATE OF CONSULTATION:  08/30/2014  CONSULTING PHYSICIAN:  Dr. Coral ElseVance Brabham.  PRIMARY CARE PHYSICIAN:  Dr. Crawford GivensGraham Duncan.    REASON FOR CONSULTATION: Diabetes and COPD management.    HISTORY OF PRESENT ILLNESS: This is a 69 year old male who has history of coronary artery disease and COPD.  He has presented with a painful left leg, found to have an ischemic left leg, was taken this morning urgently to surgery for repair of the popliteal and bypass. The patient is doing well after surgery. We have been asked to see him about his medical problems.  The patient has received some pain medication, but is able to answer a few questions, but his 2 daughters are present to give further history. He has recently had a COPD exacerbation, which he just finished a steroid pack 3 days ago and is finishing up a 10-day regimen of doxycycline and has 2 more days remaining on that. He has also been on Symbicort.   PAST MEDICAL HISTORY:  1.  Hypertension.  2.  COPD.   3.  Coronary artery disease, status post MI, last being 10 years ago with stents placed.  4.  Vertigo.  5.  Non-insulin-dependent diabetes.   PAST SURGICAL HISTORY: The recent popliteal surgery on the left leg; history of a scrotal abscess drainage.   SOCIAL HISTORY: He still smokes occasionally. Drinks alcohol occasionally.   FAMILY HISTORY: Significant for coronary artery disease.   MEDICATIONS: 1. Antivert 12.5 mg TID PRN 2. Aspirin 81 mg Daily 3. Lisinopril 40 mg Daily 4. Metoprolol 25 mg BID 5. Niaspan ER 1000 mg Daily 6. Tramadol 50 mg 6 hrs PRN 7. Vitamin C 1000 mg Daily  REVIEW OF SYSTEMS:  Difficult to obtain from the patient because of the medications. CONSTITUTIONAL: He has had no fever. HEENT:  No blurred vision. No hearing loss.  CARDIOVASCULAR: He has had no chest pain.  PULMONARY: He has had wheezing.  GASTROENTEROLOGY:  No nausea, vomiting, or  diarrhea.  GENITOURINARY:  No dysuria.  ENDOCRINE: No heat or cold intolerance but he does have diabetes.  MUSCULOSKELETAL: He has had the left leg pain.  PSYCHIATRIC: No depression.   PHYSICAL EXAMINATION:  VITAL SIGNS: Temperature is 97.9, pulse 89, respirations 16, blood pressure 126/86, pulse oximetry 94% at room air.  GENERAL: This is a well-nourished white male in no acute distress.  HEENT: The pupils are equal, round, and reactive to light. Oral mucosa is dry. The oropharynx is clear. Nasopharynx is clear.  NECK: Supple. No JVD, lymphadenopathy, or thyromegaly.  CARDIOVASCULAR: Regular rate and rhythm. No murmurs, rubs, or gallops.  LUNGS: There is some mild expiratory wheezing. No dullness to percussion. He is not using accessory muscles.  ABDOMEN: Soft, nontender, nondistended. Bowel sounds are positive. No hepatosplenomegaly. No masses.  EXTREMITIES: There is no edema. The left leg has a fresh surgical scar with suturing on the left popliteal area. The foot is warm on the left side. He moves all extremities.  NEUROLOGIC: He is mildly sedated from pain medication, cranial nerves II through XII appear to be intact.  There is no decrease in sensation in the upper and lower extremities.  SKIN: Moist with no rash.   LABORATORY DATA: White blood cells 12, hemoglobin 14.8, BUN 18, creatinine 1.59, sodium 143 and potassium is 4.   ASSESSMENT AND PLAN:  1.  Acute renal failure. The patient's creatinine appears to be above baseline.  He looks mildly dehydrated by his oral  mucosa. I suspect this is from some mild fluid loss. We will go ahead and increase his IV fluids, rate and recheck his creatinine tomorrow. I agree with holding metformin in this setting. He has no history of renal disease and, again, suspect this is going to be volume depletion.  2.  Mild chronic obstructive pulmonary disease exacerbation. He is wheezing very mildly on expiration.  He has had a history of just getting over a  fairly severe chronic obstructive pulmonary disease exacerbation according to his daughters and finished a steroid Dosepak.  I am going to go ahead and put him on scheduled nebulizers right now since he is not awake enough to use his inhaler and also some p.r.n. nebulizers if needed.  I will continue the two more days of his doxycycline as he was scheduled on.  I would like to avoid steroids with his history of diabetes and the recent surgery if possible.  3.  Non-insulin-dependent diabetes.  We are going to keep him on sliding scale while he is just not able to take oral right now.  As soon as he starts eating we will put him on some scheduled regimen.  4.  Coronary artery disease. He has had no recent symptoms of chest pain and his last myocardial infarction was 10 years ago. We will continue his medications, including his beta blocker and ACE inhibitor.  He is seen over at [facility].  He will consult them if necessary.  5.  Peripheral vascular disease. He just recently had the popliteal bypass because of his ischemic left leg. The foot is warm to touch, appears to be doing well after surgery, but Dr. Myra Gianotti is managing this.   Time spent on consult was 45 minutes.    ____________________________ Gracelyn Nurse, MD jdj:at D: 08/30/2014 17:08:00 ET T: 08/30/2014 17:37:51 ET JOB#: [id number]  cc: Gracelyn Nurse, MD, <Dictator> Nada Libman, MD Dwana Curd. Para March, MD Gracelyn Nurse MD ELECTRONICALLY SIGNED 08/30/2014 19:27

## 2014-11-09 NOTE — Consult Note (Signed)
PATIENT NAME:  [name], [name] MR#:  [id number] DATE OF BIRTH:  [birth year]  DATE OF CONSULTATION:  08/29/2014  REFERRING PHYSICIAN:   CONSULTING PHYSICIAN:  Nada Libman, MD  REASON FOR CONSULTATION: Ischemic left leg.   HISTORY: This is a 69 year old gentleman who began developing left leg pain earlier today around 2:00 p.m. He came to the Emergency Department. He had a DVT ultrasound which was negative. His pain began getting better and he went home. At that time, his family noted that his foot was somewhat discolored. He did have motor and sensory function at that time. Throughout the rest of the day his pain became more severe and he was brought back to the Emergency Department. At this time, the patient has no motor or sensory function in his left foot. It is extremely painful.   The patient has a history of coronary artery disease. He has undergone stenting x 5, most recently 10 years ago. He has also just recently gotten over a COPD flare. This was treated with a steroid Dosepak. He suffers from hypertension, which is managed with an ACE inhibitor. He is also on a beta blocker. The patient is a diabetic. He is on single agent antiplatelet therapy with aspirin. His hypercholesterolemia is managed with a statin.  His diabetes is under moderate control.  His most recent A1c was in the 7 range  PAST MEDICAL HISTORY: Hypertension, coronary artery disease, diabetes, history of MI.   SOCIAL HISTORY: No smoking. Lives at home.   MEDICATIONS: Vitamin C 1000 mg once daily, tramadol 50 mg every 6 to 8 hours as needed, Niaspan ER 1000 mg once daily, metoprolol 25 mg twice daily, lisinopril 40 mg once daily, Cleocin 150 mg every 6 hours, Bromfed DM 5 mL 4 times daily, aspirin 81 mg per day, Antivert 12.5 mg daily.   ALLERGIES: None.   REVIEW OF SYSTEMS: Please see history of present illness. Otherwise all systems are negative.   PHYSICAL EXAMINATION:  VITAL SIGNS: Temperature is 98.5, pulse 81,  respirations 20, blood pressure 116/75, oxygen saturations are 96% on room air.  GENERAL: He is well appearing, in no acute distress.  CARDIOVASCULAR: Regular rate and rhythm.  PULMONARY: Respirations are nonlabored.  ABDOMEN: Soft, nontender, nondistended.  EXTREMITIES: He has a palpable right dorsalis pedis pulse. Nonpalpable pedal pulses on the left. I cannot get Doppler signals in the foot. He has a palpable femoral pulse bilaterally.  NEUROLOGIC: The patient has no motor or sensory function up to the calf. The left foot is cool to the touch. No significant mottling is noted.  HEENT: Normal.   PERTINENT LABORATORY DATA: Potassium 4.1, creatinine 1.54, white blood cell count 17, hematocrit 52, INR 1.1.   CT angiogram: I have personally reviewed his CT angiogram. This shows left popliteal artery occlusion with no obvious source.   ASSESSMENT: Ischemic left leg.   PLAN: I discussed the CT scan results with the patient and his family. He has a threatened leg. Without revascularization he will lose his left leg. I discussed going to the Operating Room for thrombectomy and possible bypass. I also discussed proceeding with fasciotomies. We discussed that he may or may not recover neurologic function of his leg given the time that he has been ischemic. He was given a heparin bolus with continuous infusion in the Emergency Department.    ____________________________ Nada Libman, MD vwb:ts D: 08/30/2014 00:50:14 ET T: 08/30/2014 01:33:08 ET JOB#: [id number]  cc: Nada Libman, MD, <Dictator> [facility]  Janae BridgemanW BRABHAM MD ELECTRONICALLY SIGNED 08/31/2014 15:51

## 2014-11-09 NOTE — Discharge Summary (Signed)
PATIENT NAME:  [name], [name] MR#:  [id number] DATE OF BIRTH:  October 14, 1945  DATE OF ADMISSION:  08/30/2014 DATE OF DISCHARGE:  09/05/2014  DIAGNOSIS: Thrombosed left popliteal artery aneurysm.   SECONDARY DIAGNOSES:  1.  Coronary artery disease.  2.  Diabetes.  3.  Hypertension.  4.  Hypercholesterolemia.   CONSULTATIONS: Teacher, adult educationagle doc medical management.   PROCEDURES PERFORMED:  1.  08/30/2014 a left femoral to below-knee popliteal bypass grafting with saphenous vein for treatment of thrombosed popliteal artery aneurysm.  2.  Left 4-compartment fasciotomy.  3.  09/03/2014 closure of fasciotomy incisions.   HISTORY: [name] is a 69 year old gentleman who presented to the hospital with acute pain, coldness and numbness of his foot. He was found on angiography to have an ischemic leg with occlusion of his distal SFA. Subsequently, he was taken to surgery.   HOSPITAL COURSE: On the day of admission, he was taken to surgery where he undergoes vein bypass. At surgery, popliteal artery aneurysm thrombosis was defined. Because of the length of ischemia prior to surgery, 4-compartment fasciotomies were performed. Over the next several days he continued to improve. On postoperative day #4, he was felt appropriate for closure of his fasciotomy sites and this was performed under anesthesia without difficulty. Subsequently, he has done well and is now ambulating. He has had a bowel movement. He is tolerating a regular diet, and he is fit for home. He is discharged to home with home physical therapy. He will follow up in the office in approximately 2 to 2-1/2 weeks. He is to continue his home medications. Percocet is added for pain. His diet is carbohydrate-controlled. His activities are no heavy lifting or exertional activities with frequent periods of elevation.   CONDITION ON DISCHARGE: Improved.   ____________________________ Renford DillsGregory G. Schnier, MD ggs:ST D: 09/05/2014 13:37:33 ET T: 09/06/2014  00:01:30 ET JOB#: [id number]  cc: Renford DillsGregory G. Schnier, MD, <Dictator> Dwana CurdGraham S. Para Marchuncan, MD Laurey Moralealton S. McLean, MD Renford DillsGREGORY G SCHNIER MD ELECTRONICALLY SIGNED 09/16/2014 14:26

## 2014-11-12 ENCOUNTER — Telehealth: Payer: Self-pay | Admitting: *Deleted

## 2014-11-12 NOTE — Telephone Encounter (Signed)
Opened in error. Call completed already.

## 2014-11-12 NOTE — Telephone Encounter (Signed)
Spoke with Mrs. Upshaw about the order for Cardiac Rehab for her husband.  Her husband has appt with Cardiologist next week and will call us back about scheduling orientation soon.

## 2014-11-16 ENCOUNTER — Encounter: Payer: Self-pay | Admitting: Family Medicine

## 2014-11-16 DIAGNOSIS — I214 Non-ST elevation (NSTEMI) myocardial infarction: Secondary | ICD-10-CM | POA: Insufficient documentation

## 2014-11-19 ENCOUNTER — Encounter (INDEPENDENT_AMBULATORY_CARE_PROVIDER_SITE_OTHER): Payer: Self-pay

## 2014-11-19 ENCOUNTER — Encounter: Payer: Self-pay | Admitting: Cardiovascular Disease

## 2014-11-19 ENCOUNTER — Encounter: Payer: Medicare Other | Admitting: Cardiology

## 2014-11-19 ENCOUNTER — Ambulatory Visit (INDEPENDENT_AMBULATORY_CARE_PROVIDER_SITE_OTHER): Payer: Medicare Other | Admitting: Cardiovascular Disease

## 2014-11-19 ENCOUNTER — Encounter: Payer: Medicare Other | Admitting: Physician Assistant

## 2014-11-19 VITALS — BP 115/62 | HR 64 | Ht 68.0 in | Wt 197.0 lb

## 2014-11-19 DIAGNOSIS — I214 Non-ST elevation (NSTEMI) myocardial infarction: Secondary | ICD-10-CM | POA: Diagnosis not present

## 2014-11-19 DIAGNOSIS — J441 Chronic obstructive pulmonary disease with (acute) exacerbation: Secondary | ICD-10-CM

## 2014-11-19 DIAGNOSIS — I739 Peripheral vascular disease, unspecified: Secondary | ICD-10-CM | POA: Diagnosis not present

## 2014-11-19 DIAGNOSIS — E1159 Type 2 diabetes mellitus with other circulatory complications: Secondary | ICD-10-CM

## 2014-11-19 DIAGNOSIS — I10 Essential (primary) hypertension: Secondary | ICD-10-CM | POA: Diagnosis not present

## 2014-11-19 DIAGNOSIS — I2510 Atherosclerotic heart disease of native coronary artery without angina pectoris: Secondary | ICD-10-CM

## 2014-11-19 DIAGNOSIS — E785 Hyperlipidemia, unspecified: Secondary | ICD-10-CM

## 2014-11-19 DIAGNOSIS — E1151 Type 2 diabetes mellitus with diabetic peripheral angiopathy without gangrene: Secondary | ICD-10-CM

## 2014-11-19 MED ORDER — METFORMIN HCL 1000 MG PO TABS: 1000.0000 mg | ORAL_TABLET | Freq: Two times a day (BID) | ORAL | Status: DC

## 2014-11-19 NOTE — Assessment & Plan Note (Signed)
We have encouraged continued exercise, careful diet management in an effort to lose weight. 

## 2014-11-19 NOTE — Assessment & Plan Note (Signed)
Blood pressure is well controlled on today's visit. No changes made to the medications. 

## 2014-11-19 NOTE — Assessment & Plan Note (Signed)
He reports stable symptoms. Currently on albuterol as needed 50 year smoking history

## 2014-11-19 NOTE — Assessment & Plan Note (Signed)
Doing well in follow-up following recent hospitalization, non-STEMI, stents 2 placed to his distal RCA and PDA branch No changes to his medications

## 2014-11-19 NOTE — Assessment & Plan Note (Signed)
Blood pressure is well controlled on today's visit. No changes made to the medications. Blood pressure borderline low. If this runs low her or if he has symptoms, could decrease the dose of his lisinopril

## 2014-11-19 NOTE — Patient Instructions (Signed)
You are doing well. No medication changes were made.  Please call us if you have new issues that need to be addressed before your next appt.  Your physician wants you to follow-up in: 6 months.  You will receive a reminder letter in the mail two months in advance. If you don't receive a letter, please call our office to schedule the follow-up appointment.   

## 2014-11-19 NOTE — Progress Notes (Signed)
Patient ID: [name], male    DOB: 12-29-1945, 69 y.o.   MRN: [id number]  HPI Comments: [name] is a pleasant 69 year old gentleman with history of coronary artery disease, PAD, prior stent to the LAD and RCA, left femoropopliteal bypass with repair of aneurysm, long history of smoking for 50 years, who presents to establish care in the [facility] office after recent hospital admission for non-STEMI 10/24/2014.  He presented to the hospital April 15 with pain similar to his previous anginal equivalent, scapular pain between his shoulder blades. He had cardiac catheterization April 18 that showed chronically occluded mid LAD, severe disease of the distal RCA as well as PDA branch. He had DES to the distal RCA. In the holding area, he developed chest discomfort and was taken back to the cardiac catheterization lab that showed occluded PDA lesion which was stented. Discharged home on 11/01/2014  In follow-up today, he reports that he is doing well. He is back at work, denies any anginal pain or pain at twin his shoulder blades. He reports that he works daily. Wife is always trying to get him to slow down. Blood pressure has been stable at home. He does have chronic mild baseline shortness of breath which she attributes to long history of smoking  EKG on today's visit shows normal sinus rhythm with rate 64 bpm, T-wave abnormality in inferior leads, V6   Allergies  Allergen Reactions  . Augmentin [Amoxicillin-Pot Clavulanate] Other (See Comments)    Nausea,vomiting,diarrhea  . Morphine And Related Nausea And Vomiting    Vomiting, GI upset    Current Outpatient Prescriptions on File Prior to Visit  Medication Sig Dispense Refill  . albuterol (PROAIR HFA) 108 (90 BASE) MCG/ACT inhaler Inhale 2 puffs into the lungs 3 (three) times daily as needed (cough). 18 g 2  . atorvastatin (LIPITOR) 40 MG tablet Take 1 tablet (40 mg total) by mouth daily. 30 tablet 6  . Blood Glucose Monitoring  Suppl (RELION CONFIRM GLUCOSE MONITOR) W/DEVICE KIT Check blood sugar once daily as needed.  Diagnosis:  250.00  Non-insulin dependent 1 kit 0  . budesonide-formoterol (SYMBICORT) 160-4.5 MCG/ACT inhaler Inhale 2 puffs into the lungs 2 (two) times daily.    . fenofibrate (TRICOR) 145 MG tablet Take 1 tablet (145 mg total) by mouth daily. 30 tablet 6  . Fish Oil OIL Take 1 capsule by mouth 2 (two) times daily.      . fluticasone (FLONASE) 50 MCG/ACT nasal spray Place 2 sprays into the nose daily.    Marland Kitchen GLUCOSAMINE-CHONDROITIN PO Take 1 tablet by mouth 2 (two) times daily.      Marland Kitchen glucose blood (RELION CONFIRM/MICRO TEST) test strip Use as instructed to test blood sugar once daily as needed.  Diagnosis:  250.00  Non insulin-dependent. 100 each 3  . hydrochlorothiazide (HYDRODIURIL) 25 MG tablet TAKE ONE TABLET BY MOUTH ONCE DAILY 30 tablet 6  . lisinopril (PRINIVIL,ZESTRIL) 40 MG tablet TAKE ONE TABLET BY MOUTH ONCE DAILY 30 tablet 11  . meclizine (ANTIVERT) 25 MG tablet Take 25 mg by mouth every 6 (six) hours as needed. For dizziness/vertigo    . metoprolol tartrate (LOPRESSOR) 25 MG tablet Take 0.5 tablets (12.5 mg total) by mouth 2 (two) times daily. 60 tablet 11  . nitroGLYCERIN (NITROSTAT) 0.4 MG SL tablet Place 1 tablet (0.4 mg total) under the tongue every 5 (five) minutes as needed. For chest pain 25 tablet 12  . RELION LANCETS MICRO-THIN 33G MISC Use to test  blood sugar once daily as needed.  Diagnosis:  250.00   Non insulin dependent. 100 each 3  . ticagrelor (BRILINTA) 90 MG TABS tablet Take 1 tablet (90 mg total) by mouth 2 (two) times daily. 60 tablet 11   No current facility-administered medications on file prior to visit.    Past Medical History  Diagnosis Date  . CAD (coronary artery disease)     a. 1999: PCI-->LAD 2/2 MI; b. 2005: inf MI s/p PCI/DES x 4 to RCA; c. Myoview in 12/2005: EF 61%, no evidence for ischemia; d. cath 10/2014: occluded mLAD w/ L to L and L to R collats, dRCA  95% s/p PCI/DES 0%, mPDA 70%, LCx mild to mod irregs, procedure complicated by inf ST ele in recovery, repeat cath showed acute dRCA stent thrombosis o/w occluded mRPDA, PTCA dRCA, PCI/DES RPDA, aggrastat x 18 hr  . Hypercholesterolemia   . Osteoarthritis of hip   . Osteoarthritis, knee   . Tobacco abuse     Prior  . Impaired fasting glucose     Elevated after steroid injection  . COPD (chronic obstructive pulmonary disease)   . HTN (hypertension)   . H/O hiatal hernia   . Peri-rectal abscess 12/27/2011  . PAD (peripheral artery disease)     L fem to below the knee popliteal vein bypass graft  . DM2 (diabetes mellitus, type 2)     Past Surgical History  Procedure Laterality Date  . Coronary stent placement  1999, 2006    Multiple, LAD in 1999, RCA in 2005  . Incise and drain abcess  2007    on scrotum  . Coronary angioplasty    . Incision and drainage perirectal abscess  12/28/2011    Procedure: IRRIGATION AND DEBRIDEMENT PERIRECTAL ABSCESS;  Surgeon: Luella Cook III, MD;  Location: [facility];  Service: General;  Laterality: N/A;  . Femoral-popliteal bypass graft  2016    L fem to below the knee popliteal vein bypass graft    Social History  reports that he quit smoking about 16 years ago. His smoking use included Cigarettes. He has a 40 pack-year smoking history. He has never used smokeless tobacco. He reports that he drinks alcohol. He reports that he does not use illicit drugs.  Family History family history includes Heart failure in his father. There is no history of Colon cancer or Prostate cancer.   Review of Systems  Constitutional: Negative.   Respiratory: Negative.   Cardiovascular: Negative.   Gastrointestinal: Negative.   Musculoskeletal: Negative.   Skin: Negative.   Neurological: Negative.   Hematological: Negative.   Psychiatric/Behavioral: Negative.   All other systems reviewed and are negative.   BP 115/62 mmHg  Pulse 64  Ht 5' 8"  (1.727 m)  Wt 197 lb  (89.359 kg)  BMI 29.96 kg/m2  Physical Exam  Constitutional: He is oriented to person, place, and time. He appears well-developed and well-nourished.  HENT:  Head: Normocephalic.  Nose: Nose normal.  Mouth/Throat: Oropharynx is clear and moist.  Eyes: Conjunctivae are normal. Pupils are equal, round, and reactive to light.  Neck: Normal range of motion. Neck supple. No JVD present.  Cardiovascular: Normal rate, regular rhythm, S1 normal, S2 normal, normal heart sounds and intact distal pulses.  Exam reveals no gallop and no friction rub.   No murmur heard. Pulmonary/Chest: Effort normal. No respiratory distress. He has decreased breath sounds. He has no wheezes. He has no rales. He exhibits no tenderness.  Abdominal: Soft. Bowel sounds  are normal. He exhibits no distension. There is no tenderness.  Musculoskeletal: Normal range of motion. He exhibits no edema or tenderness.  Lymphadenopathy:    He has no cervical adenopathy.  Neurological: He is alert and oriented to person, place, and time. Coordination normal.  Skin: Skin is warm and dry. No rash noted. No erythema.  Psychiatric: He has a normal mood and affect. His behavior is normal. Judgment and thought content normal.      Assessment and Plan   Nursing note and vitals reviewed.

## 2014-11-19 NOTE — Assessment & Plan Note (Signed)
Previously was doing well on lovastatin 80 mg daily. Hospital changed him to 40 mg of Lipitor. Recommended that we recheck his cholesterol when he has follow-up with Dr. Para Marchuncan. Goal LDL less than 70

## 2014-11-19 NOTE — Assessment & Plan Note (Signed)
Left femoropopliteal healing well. He has follow-up with Dr. Lorretta HarpSchneir.

## 2014-11-25 ENCOUNTER — Telehealth: Payer: Self-pay | Admitting: *Deleted

## 2014-11-25 NOTE — Telephone Encounter (Signed)
Pt c/o BP issue: STAT if pt c/o blurred vision, one-sided weakness or slurred speech  1. What are your last 5 BP readings?  Will call back in the morning with at least 4 readings.   2. Are you having any other symptoms (ex. Dizziness, headache, blurred vision, passed out)?  Tiredness, feels like he is dragging out.  3. What is your BP issue? Tried, last two days are horrible, could sleep all day if he could. Just may need to adjust medication.

## 2014-11-26 ENCOUNTER — Telehealth: Payer: Self-pay

## 2014-11-26 NOTE — Telephone Encounter (Signed)
Pt wife called with BP readings: 5/16- 104/62, 135/67 5/17- 105/57, 94/53, 110/66 5/18 -105/55

## 2014-11-26 NOTE — Telephone Encounter (Signed)
For now would stop HCTZ Would also cut lisinopril in half, 20 mg Monitor blood pressure

## 2014-11-26 NOTE — Telephone Encounter (Signed)
Spoke w/ pt's wife.  She reports that pt's BP has been running low for the past week or so and pt has been c/o fatigue. She reports that pt denies any wt loss, no recent med changes, he sips fluids throughout the day, but she has noticed that he has not been eating as much. Reports that he works in the yard, but nothing more than usual and he is staying hydrated.  Advised her that I will make Dr. Mariah MillingGollan aware and call her back w/ his recommendations, but advised her that in the meantime, to have pt lie down w/ legs elevated if he feels dizzy and to make sure he changes positions slowly.  She verbalizes understanding and will be home w/ pt to make sure that he does not fall.

## 2014-11-26 NOTE — Telephone Encounter (Signed)
S/w pt regarding Dr. Windell HummingbirdGollan's instructions. Pt verbalized understanding with no further questions.

## 2014-11-26 NOTE — Telephone Encounter (Signed)
Please see previous phone note.  

## 2014-11-26 NOTE — Telephone Encounter (Signed)
Coralee RudSabrina F Gilley at 11/26/2014 10:28 AM     Status: Signed       Expand All Collapse All   Pt wife called with BP readings: 5/16- 104/62, 135/67 5/17- 105/57, 94/53, 110/66 5/18 -105/55

## 2014-12-11 DIAGNOSIS — I739 Peripheral vascular disease, unspecified: Secondary | ICD-10-CM | POA: Diagnosis not present

## 2014-12-11 DIAGNOSIS — I2510 Atherosclerotic heart disease of native coronary artery without angina pectoris: Secondary | ICD-10-CM | POA: Diagnosis not present

## 2014-12-11 DIAGNOSIS — M7989 Other specified soft tissue disorders: Secondary | ICD-10-CM | POA: Diagnosis not present

## 2014-12-11 DIAGNOSIS — M25569 Pain in unspecified knee: Secondary | ICD-10-CM | POA: Diagnosis not present

## 2014-12-11 DIAGNOSIS — I10 Essential (primary) hypertension: Secondary | ICD-10-CM | POA: Diagnosis not present

## 2014-12-11 DIAGNOSIS — I714 Abdominal aortic aneurysm, without rupture: Secondary | ICD-10-CM | POA: Diagnosis not present

## 2014-12-11 DIAGNOSIS — L97209 Non-pressure chronic ulcer of unspecified calf with unspecified severity: Secondary | ICD-10-CM | POA: Diagnosis not present

## 2014-12-11 DIAGNOSIS — M79609 Pain in unspecified limb: Secondary | ICD-10-CM | POA: Diagnosis not present

## 2014-12-11 DIAGNOSIS — I890 Lymphedema, not elsewhere classified: Secondary | ICD-10-CM | POA: Diagnosis not present

## 2014-12-11 DIAGNOSIS — E119 Type 2 diabetes mellitus without complications: Secondary | ICD-10-CM | POA: Diagnosis not present

## 2014-12-11 DIAGNOSIS — I724 Aneurysm of artery of lower extremity: Secondary | ICD-10-CM | POA: Diagnosis not present

## 2014-12-11 DIAGNOSIS — E785 Hyperlipidemia, unspecified: Secondary | ICD-10-CM | POA: Diagnosis not present

## 2014-12-24 ENCOUNTER — Telehealth: Payer: Self-pay

## 2014-12-24 ENCOUNTER — Encounter: Payer: Self-pay | Admitting: Family Medicine

## 2014-12-24 ENCOUNTER — Ambulatory Visit (INDEPENDENT_AMBULATORY_CARE_PROVIDER_SITE_OTHER): Payer: Medicare Other | Admitting: Family Medicine

## 2014-12-24 VITALS — BP 120/70 | HR 66 | Temp 98.5°F | Wt 200.8 lb

## 2014-12-24 DIAGNOSIS — R05 Cough: Secondary | ICD-10-CM

## 2014-12-24 DIAGNOSIS — I2510 Atherosclerotic heart disease of native coronary artery without angina pectoris: Secondary | ICD-10-CM | POA: Diagnosis not present

## 2014-12-24 DIAGNOSIS — R059 Cough, unspecified: Secondary | ICD-10-CM

## 2014-12-24 MED ORDER — DOXYCYCLINE HYCLATE 100 MG PO TABS: 100.0000 mg | ORAL_TABLET | Freq: Two times a day (BID) | ORAL | Status: DC

## 2014-12-24 NOTE — Progress Notes (Signed)
Pre visit review using our clinic review tool, if applicable. No additional management support is needed unless otherwise documented below in the visit note.  Med list reviewed with patient, updated.   Last week with ST from post nasal gtt.  Rhinorrhea.  Cough, dry.  Started taking mucinex and tessalon.  Still on symbicort, but not needed SABA.  Still with ST.  No sputum.  No fevers.  Wheezing more, per wife's report.    Meds, vitals, and allergies reviewed.   ROS: See HPI.  Otherwise, noncontributory.  GEN: nad, alert and oriented HEENT: mucous membranes moist, tm w/o erythema, nasal exam w/o erythema, scant clear discharge noted,  OP with mild cobblestoning, sinuses not ttp x4 NECK: supple w/o LA CV: rrr.   PULM: rare cough noted, ctab, no inc wob, no wheeze EXT: no edema

## 2014-12-24 NOTE — Patient Instructions (Signed)
Add on the flonase and albuterol.  If not better in a few days, then start doxycycline.  Take care.  Glad to see you.

## 2014-12-24 NOTE — Telephone Encounter (Signed)
Will see today. Thanks

## 2014-12-24 NOTE — Telephone Encounter (Signed)
Pt said end of last year had COPD and pt is having non prod cough,wheezing and allergies; pt wants to get abx to prevent from getting in bad shape like last year. Now no fever or SOB. Pt has to come when gets off work at 5. Pt scheduled appt today at 6:15 pm.

## 2014-12-25 NOTE — Assessment & Plan Note (Signed)
No wheeze at the OV and still well appearing.  This could be from post nasal gtt, so would add back flonase.  Can use SABA prn wheezing.  Wouldn't start abx at this point, but given his hx would hold doxy to see how he feels in the next few days.  He agrees.  Update me as needed.  Okay for outpatient f/u.

## 2015-01-14 ENCOUNTER — Other Ambulatory Visit (INDEPENDENT_AMBULATORY_CARE_PROVIDER_SITE_OTHER): Payer: Medicare Other

## 2015-01-14 DIAGNOSIS — E119 Type 2 diabetes mellitus without complications: Secondary | ICD-10-CM | POA: Diagnosis not present

## 2015-01-14 LAB — HEMOGLOBIN A1C: HEMOGLOBIN A1C: 7.4 % — AB (ref 4.6–6.5)

## 2015-01-19 ENCOUNTER — Encounter: Payer: Self-pay | Admitting: Family Medicine

## 2015-01-19 ENCOUNTER — Ambulatory Visit (INDEPENDENT_AMBULATORY_CARE_PROVIDER_SITE_OTHER): Payer: Medicare Other | Admitting: Family Medicine

## 2015-01-19 VITALS — BP 142/80 | HR 68 | Temp 98.2°F | Wt 194.0 lb

## 2015-01-19 DIAGNOSIS — R05 Cough: Secondary | ICD-10-CM | POA: Diagnosis not present

## 2015-01-19 DIAGNOSIS — R0982 Postnasal drip: Secondary | ICD-10-CM | POA: Diagnosis not present

## 2015-01-19 DIAGNOSIS — I2510 Atherosclerotic heart disease of native coronary artery without angina pectoris: Secondary | ICD-10-CM

## 2015-01-19 DIAGNOSIS — E1151 Type 2 diabetes mellitus with diabetic peripheral angiopathy without gangrene: Secondary | ICD-10-CM

## 2015-01-19 DIAGNOSIS — E1159 Type 2 diabetes mellitus with other circulatory complications: Secondary | ICD-10-CM

## 2015-01-19 DIAGNOSIS — R059 Cough, unspecified: Secondary | ICD-10-CM

## 2015-01-19 NOTE — Patient Instructions (Addendum)
Call about an eye exam if you don't get a call by the end of the month.   Recheck A1c before a visit in about 4 months.  Go to the lab on the way out.  We'll contact you with your xray report. Shirlee LimerickMarion will call about your referral to ENT.  See her on the way out.  Take care.  Glad to see you.

## 2015-01-19 NOTE — Progress Notes (Signed)
Pre visit review using our clinic review tool, if applicable. No additional management support is needed unless otherwise documented below in the visit note.  Wife has still noted some wheeze.  He is still coughing some but less than prev.  SABA doesn't help a lot.  Still fatigued.  Still stuffy, some better with flonase but not resolved.  No sputum now.     DM2 f/u.  He had eye exam last year (spring 2015).  Usually sugar ~120-140 in the AM at home.  No ADE med.  A1c similar to prev.  Diet is good.  He is working on diet.    Meds, vitals, and allergies reviewed.   ROS: See HPI.  Otherwise, noncontributory.  nad TM wnl erythema Nasal exam stuffy with clear rhinorrhea OP with mild posterior irritation but no exudates.  mmm Neck supple, no LA rrr Coarse BS B but no focal dec in BS Dry cough noted.  No wheeze.    CXR pending.

## 2015-01-20 ENCOUNTER — Ambulatory Visit (INDEPENDENT_AMBULATORY_CARE_PROVIDER_SITE_OTHER)
Admission: RE | Admit: 2015-01-20 | Discharge: 2015-01-20 | Disposition: A | Discharge status: Home / Self Care | Payer: Medicare Other | Source: Ambulatory Visit | Attending: Family Medicine | Admitting: Family Medicine

## 2015-01-20 DIAGNOSIS — R05 Cough: Secondary | ICD-10-CM

## 2015-01-20 DIAGNOSIS — R059 Cough, unspecified: Secondary | ICD-10-CM

## 2015-01-20 NOTE — Assessment & Plan Note (Signed)
A1c similar to prev, not at the point of needing change in med.  D/w pt.  Continue metformin as is, work on diet and recheck in about 4 months.  He agrees.

## 2015-01-20 NOTE — Assessment & Plan Note (Signed)
With likely post nasal gtt component.  Will refer over to ENT and check CXR in the meantime.  He'll finish the current rx for doxy.  Still nontoxic. Already on flonase without resolution.

## 2015-01-21 ENCOUNTER — Other Ambulatory Visit: Payer: Self-pay | Admitting: Family Medicine

## 2015-01-21 DIAGNOSIS — R9389 Abnormal findings on diagnostic imaging of other specified body structures: Secondary | ICD-10-CM

## 2015-01-22 ENCOUNTER — Other Ambulatory Visit: Payer: Self-pay | Admitting: Family Medicine

## 2015-01-22 ENCOUNTER — Telehealth: Payer: Self-pay | Admitting: *Deleted

## 2015-01-22 NOTE — Telephone Encounter (Signed)
Pt went X Ray recently and his PCP found nothing but they saw that he needed a carotid ultra sound done for they did find some plaque in him He is just being proactive and wanting to make sure we don't need to intervene since we are his heart Doctors  His PCP is Dr Para Marchuncan and everything is in epic.  Please call patient if we do he understood that if he did not hear from us that we won't need to intervene unless Dr Para Marchuncan will need us.

## 2015-01-22 NOTE — Telephone Encounter (Signed)
Looks like carotid ultrasound has been ordered This can be done in [place] or in [place] depending on patient preference Looks like order was put in for [place]

## 2015-01-22 NOTE — Telephone Encounter (Signed)
Pt is going to [place] next week for this. He just wanted to know do we need to step in and take over.

## 2015-01-23 DIAGNOSIS — J441 Chronic obstructive pulmonary disease with (acute) exacerbation: Secondary | ICD-10-CM | POA: Diagnosis not present

## 2015-01-23 DIAGNOSIS — J019 Acute sinusitis, unspecified: Secondary | ICD-10-CM | POA: Diagnosis not present

## 2015-01-23 DIAGNOSIS — J301 Allergic rhinitis due to pollen: Secondary | ICD-10-CM | POA: Diagnosis not present

## 2015-01-23 DIAGNOSIS — J342 Deviated nasal septum: Secondary | ICD-10-CM | POA: Diagnosis not present

## 2015-01-28 ENCOUNTER — Ambulatory Visit (HOSPITAL_COMMUNITY): Payer: Medicare Other | Attending: Cardiovascular Disease

## 2015-01-28 DIAGNOSIS — I6523 Occlusion and stenosis of bilateral carotid arteries: Secondary | ICD-10-CM | POA: Diagnosis not present

## 2015-01-28 DIAGNOSIS — R9389 Abnormal findings on diagnostic imaging of other specified body structures: Secondary | ICD-10-CM

## 2015-01-28 DIAGNOSIS — R938 Abnormal findings on diagnostic imaging of other specified body structures: Secondary | ICD-10-CM | POA: Diagnosis not present

## 2015-01-30 ENCOUNTER — Encounter: Payer: Self-pay | Admitting: Family Medicine

## 2015-01-30 DIAGNOSIS — I739 Peripheral vascular disease, unspecified: Secondary | ICD-10-CM

## 2015-01-30 DIAGNOSIS — I779 Disorder of arteries and arterioles, unspecified: Secondary | ICD-10-CM | POA: Insufficient documentation

## 2015-02-13 DIAGNOSIS — H6122 Impacted cerumen, left ear: Secondary | ICD-10-CM | POA: Diagnosis not present

## 2015-02-13 DIAGNOSIS — J019 Acute sinusitis, unspecified: Secondary | ICD-10-CM | POA: Diagnosis not present

## 2015-02-13 DIAGNOSIS — H93292 Other abnormal auditory perceptions, left ear: Secondary | ICD-10-CM | POA: Diagnosis not present

## 2015-02-13 DIAGNOSIS — J301 Allergic rhinitis due to pollen: Secondary | ICD-10-CM | POA: Diagnosis not present

## 2015-04-02 ENCOUNTER — Telehealth: Payer: Self-pay | Admitting: *Deleted

## 2015-04-02 NOTE — Telephone Encounter (Signed)
Spoke w/ pt.  He reports that he has not been outside working or sweating this week. His lisinopril was previously cut to 1/2 a pill, as his BP was dropping while he was working.  Reports his BP has been elevated, SBP 191 last night.  Advised him to resume whole pill, continue to monitor and call the office if his BP drops or remains elevated.

## 2015-04-02 NOTE — Telephone Encounter (Signed)
Pt c/o BP issue: STAT if pt c/o blurred vision, one-sided weakness or slurred speech  1. What are your last 5 BP readings?  179/88 today 181/81 yesterday   For the last week it is running 180/80 range.  2. Are you having any other symptoms (ex. Dizziness, headache, blurred vision, passed out)? Not having anything else. Not feeling different.   3. What is your BP issue? Wants to know if he needs to take a full pill of Lisinopril for right now he is taking half

## 2015-04-06 NOTE — Telephone Encounter (Addendum)
Spoke w/ pt.  He reports that he increased his lisinopril to a whole 40 mg pill daily, but his BP is still elevated.  Reports SBP has been ranging 150-180 since increasing lisinopril on Friday.  Pt's HCTZ was d/c'd in May 2/2 low BP. He would like to know if he should resume HCTZ or if Dr. Mariah Milling has any other recommendations.

## 2015-04-06 NOTE — Telephone Encounter (Signed)
Previous low blood pressure possibly from the heat, dehydration in the summer, working in the garden  Would restart lisinopril 40 mg daily, restart HCTZ 25 mg daily Would give it one week to start working

## 2015-04-07 ENCOUNTER — Other Ambulatory Visit: Payer: Self-pay | Admitting: Cardiology

## 2015-04-07 MED ORDER — HYDROCHLOROTHIAZIDE 25 MG PO TABS: 25.0000 mg | ORAL_TABLET | Freq: Every day | ORAL | Status: DC

## 2015-04-07 NOTE — Telephone Encounter (Addendum)
Spoke w/ pt.  Advised him of Dr. Windell Hummingbird recommendation.  He verbalizes understanding and will call if his BP remains elevated after implementing these changes.

## 2015-04-07 NOTE — Addendum Note (Signed)
Addended by: Marilynne Halsted on: 04/07/2015 09:32 AM   Modules accepted: Orders

## 2015-04-28 ENCOUNTER — Other Ambulatory Visit: Payer: Self-pay | Admitting: Family Medicine

## 2015-05-13 ENCOUNTER — Other Ambulatory Visit: Payer: Self-pay | Admitting: Family Medicine

## 2015-05-13 NOTE — Telephone Encounter (Signed)
Received refill request electronically Last office visit 01/19/15/acute Medication no longer on medication list Is it okay to refill?

## 2015-05-14 NOTE — Telephone Encounter (Signed)
Sent.  If cough continues, then needs f/u.  Thanks.

## 2015-05-14 NOTE — Telephone Encounter (Signed)
Pt notified Rx sent and to f/u if no improvement  °

## 2015-05-21 ENCOUNTER — Encounter: Payer: Self-pay | Admitting: Cardiovascular Disease

## 2015-05-21 ENCOUNTER — Ambulatory Visit (INDEPENDENT_AMBULATORY_CARE_PROVIDER_SITE_OTHER): Payer: Medicare Other | Admitting: Cardiovascular Disease

## 2015-05-21 VITALS — BP 138/80 | HR 61 | Resp 16 | Ht 68.0 in | Wt 201.0 lb

## 2015-05-21 DIAGNOSIS — I2510 Atherosclerotic heart disease of native coronary artery without angina pectoris: Secondary | ICD-10-CM | POA: Diagnosis not present

## 2015-05-21 DIAGNOSIS — I10 Essential (primary) hypertension: Secondary | ICD-10-CM

## 2015-05-21 DIAGNOSIS — I779 Disorder of arteries and arterioles, unspecified: Secondary | ICD-10-CM | POA: Diagnosis not present

## 2015-05-21 DIAGNOSIS — E785 Hyperlipidemia, unspecified: Secondary | ICD-10-CM

## 2015-05-21 DIAGNOSIS — I739 Peripheral vascular disease, unspecified: Secondary | ICD-10-CM

## 2015-05-21 DIAGNOSIS — I214 Non-ST elevation (NSTEMI) myocardial infarction: Secondary | ICD-10-CM | POA: Diagnosis not present

## 2015-05-21 DIAGNOSIS — J432 Centrilobular emphysema: Secondary | ICD-10-CM

## 2015-05-21 NOTE — Patient Instructions (Signed)
You are doing well. No medication changes were made.  For allergy, Take zyrtec daily (cetirazine)  Please call us if you have new issues that need to be addressed before your next appt.  Your physician wants you to follow-up in: 12 months.  You will receive a reminder letter in the mail two months in advance. If you don't receive a letter, please call our office to schedule the follow-up appointment.

## 2015-05-21 NOTE — Assessment & Plan Note (Signed)
Currently with no symptoms of angina. No further workup at this time. Continue current medication regimen. 

## 2015-05-21 NOTE — Assessment & Plan Note (Signed)
Cholesterol is at goal on the current lipid regimen. No changes to the medications were made.  

## 2015-05-21 NOTE — Assessment & Plan Note (Signed)
No symptoms concerning for angina. Continue current medications, No further testing

## 2015-05-21 NOTE — Assessment & Plan Note (Signed)
Blood pressure is well controlled on today's visit. No changes made to the medications. 

## 2015-05-21 NOTE — Assessment & Plan Note (Signed)
Significant wheezing on today's visit, likely exacerbated by allergies, postnasal drip Recommended if symptoms do not improve with cetirazine, Flonase, that he contact Dr. Para Marchuncan. May need short course of steroids

## 2015-05-21 NOTE — Assessment & Plan Note (Signed)
40-59% disease on the left Stressed importance of aggressive diabetes control

## 2015-05-21 NOTE — Progress Notes (Signed)
Patient ID: [name], male    DOB: 08/07/1945, 69 y.o.   MRN: [id number]  HPI Comments: [name] is a pleasant 69 year old gentleman with history of coronary artery disease, PAD, prior stent to the LAD and RCA, left femoropopliteal bypass with repair of aneurysm, long history of smoking for 50 years,  non-STEMI 10/24/2014 who presents for routine follow-up of his coronary artery disease.  previous anginal equivalent, scapular pain between his shoulder blades.   He is suffering with significant allergies this fall Postnasal drip, coughing, wheezing  he is taking Mucinex with minimal relief. He has tried Flonase  Denies any symptoms concerning for angina Does walk a lot at work, denies any chest pain, shortness of breath, PND, orthopnea, no leg swelling Tolerating his medications Try to do better with his diet, weight loss. Hemoglobin A1c greater than 7  EKG on today's visit shows normal sinus rhythm with rate 61 bpm, unable to exclude old inferior MI  Other past medical history   presented to the hospital April 15 with pain similar to his previous anginal equivalent, scapular pain between his shoulder blades. He had cardiac catheterization April 18 that showed chronically occluded mid LAD, severe disease of the distal RCA as well as PDA branch. He had DES to the distal RCA. In the holding area, he developed chest discomfort and was taken back to the cardiac catheterization lab that showed occluded PDA lesion which was stented. Discharged home on 11/01/2014     Allergies  Allergen Reactions  . Augmentin [Amoxicillin-Pot Clavulanate] Other (See Comments)    Nausea,vomiting,diarrhea  . Morphine And Related Nausea And Vomiting    Vomiting, GI upset    Current Outpatient Prescriptions on File Prior to Visit  Medication Sig Dispense Refill  . albuterol (PROAIR HFA) 108 (90 BASE) MCG/ACT inhaler Inhale 2 puffs into the lungs 3 (three) times daily as needed (cough). 18 g 2  .  aspirin 81 MG EC tablet Take 1 tablet (81 mg total) by mouth daily. 30 tablet 0  . atorvastatin (LIPITOR) 40 MG tablet Take 1 tablet (40 mg total) by mouth daily. 30 tablet 6  . benzonatate (TESSALON) 200 MG capsule TAKE ONE CAPSULE BY MOUTH THREE TIMES DAILY AS NEEDED 30 capsule 0  . Blood Glucose Monitoring Suppl (RELION CONFIRM GLUCOSE MONITOR) W/DEVICE KIT Check blood sugar once daily as needed.  Diagnosis:  250.00  Non-insulin dependent 1 kit 0  . fenofibrate (TRICOR) 145 MG tablet TAKE ONE TABLET BY MOUTH ONCE DAILY 30 tablet 8  . fluticasone (FLONASE) 50 MCG/ACT nasal spray Place 2 sprays into the nose daily.    Marland Kitchen glucose blood (RELION CONFIRM/MICRO TEST) test strip Use as instructed to test blood sugar once daily as needed.  Diagnosis:  250.00  Non insulin-dependent. 100 each 3  . hydrochlorothiazide (HYDRODIURIL) 25 MG tablet Take 1 tablet (25 mg total) by mouth daily. 90 tablet 3  . lisinopril (PRINIVIL,ZESTRIL) 40 MG tablet TAKE ONE TABLET BY MOUTH ONCE DAILY 30 tablet 11  . meclizine (ANTIVERT) 25 MG tablet Take 25 mg by mouth every 6 (six) hours as needed. For dizziness/vertigo    . metFORMIN (GLUCOPHAGE) 500 MG tablet Take by mouth 2 (two) times daily with a meal.    . metoprolol tartrate (LOPRESSOR) 25 MG tablet Take 0.5 tablets (12.5 mg total) by mouth 2 (two) times daily. 60 tablet 11  . nitroGLYCERIN (NITROSTAT) 0.4 MG SL tablet Place 1 tablet (0.4 mg total) under the tongue every 5 (five)  minutes as needed. For chest pain 25 tablet 12  . PROAIR HFA 108 (90 BASE) MCG/ACT inhaler INHALE TWO PUFFS BY MOUTH EVERY 4 HOURS AS NEEDED FOR WHEEZING 18 each 1  . RELION LANCETS MICRO-THIN 33G MISC Use to test blood sugar once daily as needed.  Diagnosis:  250.00   Non insulin dependent. 100 each 3  . SYMBICORT 160-4.5 MCG/ACT inhaler INHALE TWO PUFFS BY MOUTH IN THE MORNING 1 Inhaler 1  . ticagrelor (BRILINTA) 90 MG TABS tablet Take 1 tablet (90 mg total) by mouth 2 (two) times daily. 60  tablet 11  . traMADol (ULTRAM) 50 MG tablet Take 1 tablet (50 mg total) by mouth every 6 (six) hours as needed. 30 tablet 0   No current facility-administered medications on file prior to visit.    Past Medical History  Diagnosis Date  . CAD (coronary artery disease)     a. 1999: PCI-->LAD 2/2 MI; b. 2005: inf MI s/p PCI/DES x 4 to RCA; c. Myoview in 12/2005: EF 61%, no evidence for ischemia; d. cath 10/2014: occluded mLAD w/ L to L and L to R collats, dRCA 95% s/p PCI/DES 0%, mPDA 70%, LCx mild to mod irregs, procedure complicated by inf ST ele in recovery, repeat cath showed acute dRCA stent thrombosis o/w occluded mRPDA, PTCA dRCA, PCI/DES RPDA, aggrastat x 18 hr  . Hypercholesterolemia   . Osteoarthritis of hip   . Osteoarthritis, knee   . Tobacco abuse     Prior  . Impaired fasting glucose     Elevated after steroid injection  . COPD (chronic obstructive pulmonary disease) ([facility])   . HTN (hypertension)   . H/O hiatal hernia   . Peri-rectal abscess 12/27/2011  . PAD (peripheral artery disease) ([facility])     L fem to below the knee popliteal vein bypass graft  . DM2 (diabetes mellitus, type 2) ([place])     Past Surgical History  Procedure Laterality Date  . Coronary stent placement  1999, 2006    Multiple, LAD in 1999, RCA in 2005  . Incise and drain abcess  2007    on scrotum  . Coronary angioplasty    . Incision and drainage perirectal abscess  12/28/2011    Procedure: IRRIGATION AND DEBRIDEMENT PERIRECTAL ABSCESS;  Surgeon: Luella Cook III, MD;  Location: [facility];  Service: General;  Laterality: N/A;  . Femoral-popliteal bypass graft  2016    L fem to below the knee popliteal vein bypass graft    Social History  reports that he quit smoking about 16 years ago. His smoking use included Cigarettes. He has a 40 pack-year smoking history. He has never used smokeless tobacco. He reports that he drinks alcohol. He reports that he does not use illicit drugs.  Family History family history  includes Heart failure in his father. There is no history of Colon cancer or Prostate cancer.   Review of Systems  Constitutional: Negative.   HENT: Positive for postnasal drip.   Eyes: Positive for itching.  Respiratory: Positive for wheezing.   Cardiovascular: Negative.   Gastrointestinal: Negative.   Musculoskeletal: Negative.   Skin: Negative.   Neurological: Negative.   Hematological: Negative.   Psychiatric/Behavioral: Negative.   All other systems reviewed and are negative.   BP 138/80 mmHg  Pulse 61  Resp 16  Ht 5' 8"  (1.727 m)  Wt 201 lb (91.173 kg)  BMI 30.57 kg/m2  Physical Exam  Constitutional: He is oriented to person, place, and time.  He appears well-developed and well-nourished.  HENT:  Head: Normocephalic.  Nose: Nose normal.  Mouth/Throat: Oropharynx is clear and moist.  Eyes: Conjunctivae are normal. Pupils are equal, round, and reactive to light.  Neck: Normal range of motion. Neck supple. No JVD present.  Cardiovascular: Normal rate, regular rhythm, S1 normal, S2 normal, normal heart sounds and intact distal pulses.  Exam reveals no gallop and no friction rub.   No murmur heard. Pulmonary/Chest: Effort normal. No respiratory distress. He has decreased breath sounds. He has no wheezes. He has no rales. He exhibits no tenderness.  Abdominal: Soft. Bowel sounds are normal. He exhibits no distension. There is no tenderness.  Musculoskeletal: Normal range of motion. He exhibits no edema or tenderness.  Lymphadenopathy:    He has no cervical adenopathy.  Neurological: He is alert and oriented to person, place, and time. Coordination normal.  Skin: Skin is warm and dry. No rash noted. No erythema.  Psychiatric: He has a normal mood and affect. His behavior is normal. Judgment and thought content normal.      Assessment and Plan   Nursing note and vitals reviewed.

## 2015-05-22 ENCOUNTER — Other Ambulatory Visit: Payer: Self-pay | Admitting: Family Medicine

## 2015-05-22 ENCOUNTER — Other Ambulatory Visit (INDEPENDENT_AMBULATORY_CARE_PROVIDER_SITE_OTHER): Payer: Medicare Other

## 2015-05-22 DIAGNOSIS — E1151 Type 2 diabetes mellitus with diabetic peripheral angiopathy without gangrene: Secondary | ICD-10-CM

## 2015-05-22 LAB — HEMOGLOBIN A1C: Hgb A1c MFr Bld: 8.3 % — ABNORMAL HIGH (ref 4.6–6.5)

## 2015-05-24 ENCOUNTER — Other Ambulatory Visit: Payer: Self-pay | Admitting: Cardiovascular Disease

## 2015-06-02 ENCOUNTER — Ambulatory Visit (INDEPENDENT_AMBULATORY_CARE_PROVIDER_SITE_OTHER): Payer: Medicare Other | Admitting: Family Medicine

## 2015-06-02 ENCOUNTER — Encounter: Payer: Self-pay | Admitting: Family Medicine

## 2015-06-02 VITALS — BP 116/70 | HR 62 | Temp 97.6°F | Wt 201.0 lb

## 2015-06-02 DIAGNOSIS — E1151 Type 2 diabetes mellitus with diabetic peripheral angiopathy without gangrene: Secondary | ICD-10-CM | POA: Diagnosis not present

## 2015-06-02 DIAGNOSIS — Z23 Encounter for immunization: Secondary | ICD-10-CM | POA: Diagnosis not present

## 2015-06-02 DIAGNOSIS — I2510 Atherosclerotic heart disease of native coronary artery without angina pectoris: Secondary | ICD-10-CM

## 2015-06-02 MED ORDER — METFORMIN HCL 500 MG PO TABS: 1000.0000 mg | ORAL_TABLET | Freq: Two times a day (BID) | ORAL | Status: DC

## 2015-06-02 NOTE — Patient Instructions (Addendum)
I would try to gradually increase your metformin to 4 pills a day, (2 in AM, 2 in PM). See if you can tolerate that.   Max 4 pills a day.   Recheck labs in about 3 months before a visit.  Keep working on [employer]. Holdingsyour diet.   Try taking AM pills after breakfast.  Reasonable to check sugar before and then 2 hours after later.  See if your sugar is spiking.    Take care.  Glad to see you.

## 2015-06-02 NOTE — Progress Notes (Signed)
Pre visit review using our clinic review tool, if applicable. No additional management support is needed unless otherwise documented below in the visit note.  Diabetes:  Using medications without difficulties:yes Hypoglycemic episodes:no Hyperglycemic episodes:no Feet problems: at baseline.   Blood Sugars averaging: usually 120-140s in the AMs and that is similar to prev per patient report.   A1c up.  D/w pt.   Taking metformin 500mg  BID.  Compliant with meds.   Minimal inc in weight.  Still working on diet, still working to cut out carbs.   He likely has progressive decline in pancreatic reserve and that is causing his A1c elevation.   Still with some nasal congestion.  Has seen ENT recently, some better with mucinex. About "2/3rds" better.  Still on inhalers, compliant.   Pt with indigestion in AM, currently taking meds with/before breakfast.  D/w pt about taking meds after meals to try to help with sx.  He agrees to try.    Meds, vitals, and allergies reviewed.   ROS: See HPI.  Otherwise negative.    GEN: nad, alert and oriented HEENT: mucous membranes moist NECK: supple w/o LA CV: rrr. PULM: ctab, no inc wob ABD: soft, +bs EXT: no edema

## 2015-06-03 NOTE — Assessment & Plan Note (Signed)
Labs d/w pt.  Will gradually increase metformin to 4 pills a day, (2 in AM, 2 in PM).  Max 4 pills a day.  Recheck labs in about 3 months before a visit.  Keep working on diet.  Try taking AM pills after breakfast to see if better tolerated.   Reasonable to check sugar before and then 2 hours after later to see about potential spikes in sugar.

## 2015-06-07 ENCOUNTER — Other Ambulatory Visit: Payer: Self-pay | Admitting: Cardiology

## 2015-06-08 ENCOUNTER — Telehealth: Payer: Self-pay | Admitting: Family Medicine

## 2015-06-08 MED ORDER — METFORMIN HCL 500 MG PO TABS: 1500.0000 mg | ORAL_TABLET | Freq: Every day | ORAL | Status: DC

## 2015-06-08 NOTE — Telephone Encounter (Signed)
Cut back to 3 pills a day (ie 2 in AM 1 in PM or 1 TID).  See if he can tolerate that.  If still not tolerated, then back down to 1 BID update me.   Thanks.

## 2015-06-08 NOTE — Telephone Encounter (Signed)
Pt double metformin per dr Para Marchduncan  He stated this is causing diarrhea. What can he do for this.  Please advise

## 2015-06-09 NOTE — Telephone Encounter (Signed)
Patient advised.

## 2015-06-25 DIAGNOSIS — I10 Essential (primary) hypertension: Secondary | ICD-10-CM | POA: Diagnosis not present

## 2015-06-25 DIAGNOSIS — I714 Abdominal aortic aneurysm, without rupture: Secondary | ICD-10-CM | POA: Diagnosis not present

## 2015-06-25 DIAGNOSIS — M25569 Pain in unspecified knee: Secondary | ICD-10-CM | POA: Diagnosis not present

## 2015-06-25 DIAGNOSIS — E785 Hyperlipidemia, unspecified: Secondary | ICD-10-CM | POA: Diagnosis not present

## 2015-06-25 DIAGNOSIS — I724 Aneurysm of artery of lower extremity: Secondary | ICD-10-CM | POA: Diagnosis not present

## 2015-06-25 DIAGNOSIS — M7989 Other specified soft tissue disorders: Secondary | ICD-10-CM | POA: Diagnosis not present

## 2015-06-25 DIAGNOSIS — L97209 Non-pressure chronic ulcer of unspecified calf with unspecified severity: Secondary | ICD-10-CM | POA: Diagnosis not present

## 2015-06-25 DIAGNOSIS — E119 Type 2 diabetes mellitus without complications: Secondary | ICD-10-CM | POA: Diagnosis not present

## 2015-06-25 DIAGNOSIS — I739 Peripheral vascular disease, unspecified: Secondary | ICD-10-CM | POA: Diagnosis not present

## 2015-06-25 DIAGNOSIS — I890 Lymphedema, not elsewhere classified: Secondary | ICD-10-CM | POA: Diagnosis not present

## 2015-06-25 DIAGNOSIS — I2510 Atherosclerotic heart disease of native coronary artery without angina pectoris: Secondary | ICD-10-CM | POA: Diagnosis not present

## 2015-06-25 DIAGNOSIS — M79609 Pain in unspecified limb: Secondary | ICD-10-CM | POA: Diagnosis not present

## 2015-09-24 ENCOUNTER — Other Ambulatory Visit: Payer: Self-pay | Admitting: Cardiovascular Disease

## 2015-10-23 ENCOUNTER — Other Ambulatory Visit: Payer: Self-pay | Admitting: Family Medicine

## 2015-11-15 ENCOUNTER — Other Ambulatory Visit: Payer: Self-pay | Admitting: Physician Assistant

## 2015-11-20 ENCOUNTER — Ambulatory Visit (INDEPENDENT_AMBULATORY_CARE_PROVIDER_SITE_OTHER): Payer: Medicare Other

## 2015-11-20 ENCOUNTER — Other Ambulatory Visit (INDEPENDENT_AMBULATORY_CARE_PROVIDER_SITE_OTHER): Payer: Medicare Other

## 2015-11-20 ENCOUNTER — Other Ambulatory Visit: Payer: Self-pay | Admitting: Family Medicine

## 2015-11-20 VITALS — BP 112/78 | HR 66 | Temp 97.8°F | Ht 67.5 in | Wt 200.0 lb

## 2015-11-20 DIAGNOSIS — E785 Hyperlipidemia, unspecified: Secondary | ICD-10-CM

## 2015-11-20 DIAGNOSIS — Z1159 Encounter for screening for other viral diseases: Secondary | ICD-10-CM

## 2015-11-20 DIAGNOSIS — I10 Essential (primary) hypertension: Secondary | ICD-10-CM

## 2015-11-20 DIAGNOSIS — Z0000 Encounter for general adult medical examination without abnormal findings: Secondary | ICD-10-CM

## 2015-11-20 DIAGNOSIS — E1151 Type 2 diabetes mellitus with diabetic peripheral angiopathy without gangrene: Secondary | ICD-10-CM

## 2015-11-20 LAB — COMPREHENSIVE METABOLIC PANEL
ALT: 26 U/L (ref 0–53)
AST: 20 U/L (ref 0–37)
Albumin: 4.2 g/dL (ref 3.5–5.2)
Alkaline Phosphatase: 35 U/L — ABNORMAL LOW (ref 39–117)
BILIRUBIN TOTAL: 0.6 mg/dL (ref 0.2–1.2)
BUN: 24 mg/dL — ABNORMAL HIGH (ref 6–23)
CALCIUM: 9.9 mg/dL (ref 8.4–10.5)
CO2: 29 meq/L (ref 19–32)
CREATININE: 1.49 mg/dL (ref 0.40–1.50)
Chloride: 102 mEq/L (ref 96–112)
GFR: 49.47 mL/min — ABNORMAL LOW (ref >60.00)
GLUCOSE: 141 mg/dL — AB (ref 70–99)
Potassium: 4.4 mEq/L (ref 3.5–5.1)
Sodium: 139 mEq/L (ref 135–145)
Total Protein: 6.7 g/dL (ref 6.0–8.3)

## 2015-11-20 LAB — HEMOGLOBIN A1C: Hgb A1c MFr Bld: 9.1 % — ABNORMAL HIGH (ref 4.6–6.5)

## 2015-11-20 LAB — LIPID PANEL
CHOL/HDL RATIO: 6
CHOLESTEROL: 111 mg/dL (ref 0–200)
HDL: 19.7 mg/dL — ABNORMAL LOW (ref >39.00)
NonHDL: 91.68
Triglycerides: 263 mg/dL — ABNORMAL HIGH (ref 0.0–149.0)
VLDL: 52.6 mg/dL — ABNORMAL HIGH (ref 0.0–40.0)

## 2015-11-20 LAB — LDL CHOLESTEROL, DIRECT: Direct LDL: 56 mg/dL

## 2015-11-20 NOTE — Progress Notes (Signed)
Pre visit review using our clinic review tool, if applicable. No additional management support is needed unless otherwise documented below in the visit note. 

## 2015-11-20 NOTE — Patient Instructions (Signed)
[name] , Thank you for taking time to come for your Medicare Wellness Visit. I appreciate your ongoing commitment to your health goals. Please review the following plan we discussed and let me know if I can assist you in the future.   These are the goals we discussed: Goals    .  A1C within normal range     Starting 11/20/2015, I will continue to monitor intake of simple carbohydrates by focusing on eating lower glycemic foods and drinking diet soda.        This is a list of the screening recommended for you and due dates:  Health Maintenance  Topic Date Due  . Complete foot exam   04/10/2016*  . Eye exam for diabetics  04/10/2016*  . Shingles Vaccine  11/19/2016*  . Flu Shot  02/09/2016  . Hemoglobin A1C  05/22/2016  . Tetanus Vaccine  01/20/2020  . Colon Cancer Screening  12/16/2020  .  Hepatitis C: One time screening is recommended by Center for Disease Control  (CDC) for  adults born from [birth year] through 1966.   Completed  . Pneumonia vaccines  Completed  *Topic was postponed. The date shown is not the original due date.   Preventive Care for Adults  A healthy lifestyle and preventive care can promote health and wellness. Preventive health guidelines for adults include the following key practices.  . A routine yearly physical is a good way to check with your health care provider about your health and preventive screening. It is a chance to share any concerns and updates on your health and to receive a thorough exam.  . Visit your dentist for a routine exam and preventive care every 6 months. Brush your teeth twice a day and floss once a day. Good oral hygiene prevents tooth decay and gum disease.  . The frequency of eye exams is based on your age, health, family medical history, use  of contact lenses, and other factors. Follow your health care provider's ecommendations for frequency of eye exams.  . Eat a healthy diet. Foods like vegetables, fruits, whole grains, low-fat  dairy products, and lean protein foods contain the nutrients you need without too many calories. Decrease your intake of foods high in solid fats, added sugars, and salt. Eat the right amount of calories for you. Get information about a proper diet from your health care provider, if necessary.  . Regular physical exercise is one of the most important things you can do for your health. Most adults should get at least 150 minutes of moderate-intensity exercise (any activity that increases your heart rate and causes you to sweat) each week. In addition, most adults need muscle-strengthening exercises on 2 or more days a week.  Silver Sneakers may be a benefit available to you. To determine eligibility, you may visit the website: www.silversneakers.com or contact program at [phone] Mon-Fri between 8AM-8PM.   . Maintain a healthy weight. The body mass index (BMI) is a screening tool to identify possible weight problems. It provides an estimate of body fat based on height and weight. Your health care provider can find your BMI and can help you achieve or maintain a healthy weight.   For adults 20 years and older: ? A BMI below 18.5 is considered underweight. ? A BMI of 18.5 to 24.9 is normal. ? A BMI of 25 to 29.9 is considered overweight. ? A BMI of 30 and above is considered obese.   . Maintain normal blood lipids and cholesterol  levels by exercising and minimizing your intake of saturated fat. Eat a balanced diet with plenty of fruit and vegetables. Blood tests for lipids and cholesterol should begin at age 55 and be repeated every 5 years. If your lipid or cholesterol levels are high, you are over 50, or you are at high risk for heart disease, you may need your cholesterol levels checked more frequently. Ongoing high lipid and cholesterol levels should be treated with medicines if diet and exercise are not working.  . If you smoke, find out from your health care provider how to quit. If you do  not use tobacco, please do not start.  . If you choose to drink alcohol, please do not consume more than 2 drinks per day. One drink is considered to be 12 ounces (355 mL) of beer, 5 ounces (148 mL) of wine, or 1.5 ounces (44 mL) of liquor.  . If you are 61-55 years old, ask your health care provider if you should take aspirin to prevent strokes.  . Use sunscreen. Apply sunscreen liberally and repeatedly throughout the day. You should seek shade when your shadow is shorter than you. Protect yourself by wearing long sleeves, pants, a wide-brimmed hat, and sunglasses year round, whenever you are outdoors.  . Once a month, do a whole body skin exam, using a mirror to look at the skin on your back. Tell your health care provider of new moles, moles that have irregular borders, moles that are larger than a pencil eraser, or moles that have changed in shape or color.

## 2015-11-20 NOTE — Progress Notes (Signed)
Subjective:   [name] is a 70 y.o. male who presents for Medicare Annual/Subsequent preventive examination.  Review of Systems: N/A Cardiac Risk Factors include: advanced age (>82mn, >>60women);sedentary lifestyle;diabetes mellitus;dyslipidemia;hypertension;male gender     Objective:    Vitals: BP 112/78 mmHg  Pulse 66  Temp(Src) 97.8 F (36.6 C) (Oral)  Ht 5' 7.5" (1.715 m)  Wt 200 lb (90.719 kg)  BMI 30.84 kg/m2  SpO2 96%  Body mass index is 30.84 kg/(m^2).  Tobacco History  Smoking status  . Former Smoker -- 1.00 packs/day for 40 years  . Types: Cigarettes  . Quit date: 10/21/1998  Smokeless tobacco  . Never Used    Comment: Past smoker, states he will smoke maybe 1 cigarette/ month or so still     Counseling given: No   Past Medical History  Diagnosis Date  . CAD (coronary artery disease)     a. 1999: PCI-->LAD 2/2 MI; b. 2005: inf MI s/p PCI/DES x 4 to RCA; c. Myoview in 12/2005: EF 61%, no evidence for ischemia; d. cath 10/2014: occluded mLAD w/ L to L and L to R collats, dRCA 95% s/p PCI/DES 0%, mPDA 70%, LCx mild to mod irregs, procedure complicated by inf ST ele in recovery, repeat cath showed acute dRCA stent thrombosis o/w occluded mRPDA, PTCA dRCA, PCI/DES RPDA, aggrastat x 18 hr  . Hypercholesterolemia   . Osteoarthritis of hip   . Osteoarthritis, knee   . Tobacco abuse     Prior  . Impaired fasting glucose     Elevated after steroid injection  . COPD (chronic obstructive pulmonary disease) ([facility]   . HTN (hypertension)   . H/O hiatal hernia   . Peri-rectal abscess 12/27/2011  . PAD (peripheral artery disease) ([facility])     L fem to below the knee popliteal vein bypass graft  . DM2 (diabetes mellitus, type 2) ([facility]    Past Surgical History  Procedure Laterality Date  . Coronary stent placement  1999, 2006    Multiple, LAD in 1999, RCA in 2005  . Incise and drain abcess  2007    on scrotum  . Coronary angioplasty    . Incision and drainage  perirectal abscess  12/28/2011    Procedure: IRRIGATION AND DEBRIDEMENT PERIRECTAL ABSCESS;  Surgeon: PLuella CookIII, MD;  Location[place] [facility]  Service: General;  Laterality: N/A;  . Femoral-popliteal bypass graft  2016    L fem to below the knee popliteal vein bypass graft   Family History  Problem Relation Age of Onset  . Heart failure Father     CHF  . Colon cancer Neg Hx   . Prostate cancer Neg Hx    History  Sexual Activity  . Sexual Activity: No    Outpatient Encounter Prescriptions as of 11/20/2015  Medication Sig  . albuterol (PROAIR HFA) 108 (90 BASE) MCG/ACT inhaler Inhale 2 puffs into the lungs 3 (three) times daily as needed (cough).  .Marland Kitchenaspirin 81 MG EC tablet Take 1 tablet (81 mg total) by mouth daily.  .Marland Kitchenatorvastatin (LIPITOR) 40 MG tablet TAKE ONE TABLET BY MOUTH ONCE DAILY  . benzonatate (TESSALON) 200 MG capsule TAKE ONE CAPSULE BY MOUTH THREE TIMES DAILY AS NEEDED  . Blood Glucose Monitoring Suppl (RELION CONFIRM GLUCOSE MONITOR) W/DEVICE KIT Check blood sugar once daily as needed.  Diagnosis:  250.00  Non-insulin dependent  . BRILINTA 90 MG TABS tablet TAKE ONE TABLET BY MOUTH TWICE DAILY  . fenofibrate (TRICOR)  145 MG tablet TAKE ONE TABLET BY MOUTH ONCE DAILY  . fluticasone (FLONASE) 50 MCG/ACT nasal spray Place 2 sprays into the nose daily.  Marland Kitchen glucose blood (RELION CONFIRM/MICRO TEST) test strip Use as instructed to test blood sugar once daily as needed.  Diagnosis:  250.00  Non insulin-dependent.  . hydrochlorothiazide (HYDRODIURIL) 25 MG tablet Take 1 tablet (25 mg total) by mouth daily.  Marland Kitchen lisinopril (PRINIVIL,ZESTRIL) 40 MG tablet TAKE ONE TABLET BY MOUTH ONCE DAILY  . meclizine (ANTIVERT) 25 MG tablet Take 25 mg by mouth every 6 (six) hours as needed. For dizziness/vertigo  . metFORMIN (GLUCOPHAGE) 500 MG tablet Take 3 tablets (1,500 mg total) by mouth daily.  . metoprolol tartrate (LOPRESSOR) 25 MG tablet TAKE ONE-HALF TABLET BY MOUTH TWICE DAILY  .  nitroGLYCERIN (NITROSTAT) 0.4 MG SL tablet Place 1 tablet (0.4 mg total) under the tongue every 5 (five) minutes as needed. For chest pain  . RELION LANCETS MICRO-THIN 33G MISC Use to test blood sugar once daily as needed.  Diagnosis:  250.00   Non insulin dependent.  . SYMBICORT 160-4.5 MCG/ACT inhaler INHALE TWO PUFFS BY MOUTH IN THE MORNING  . traMADol (ULTRAM) 50 MG tablet Take 1 tablet (50 mg total) by mouth every 6 (six) hours as needed.   No facility-administered encounter medications on file as of 11/20/2015.    Activities of Daily Living In your present state of health, do you have any difficulty performing the following activities: 11/20/2015  Hearing? Y  Vision? N  Difficulty concentrating or making decisions? N  Walking or climbing stairs? N  Dressing or bathing? N  Doing errands, shopping? N  Preparing Food and eating ? N  Using the Toilet? N  In the past six months, have you accidently leaked urine? N  Do you have problems with loss of bowel control? N  Managing your Medications? N  Managing your Finances? N  Housekeeping or managing your Housekeeping? N    Patient Care Team: Tonia Ghent, MD as PCP - General   Assessment:     Hearing Screening   _0  _1  _2  _3  _4  _5  _6   Right ear:   0 0 0 40   Left ear:   0 0 40 0   Vision Screening Comments: Last eye exam approx 2 yrs ago   Exercise Activities and Dietary recommendations Current Exercise Habits: The patient does not participate in regular exercise at present, Exercise limited by: None identified  Goals    .  A1C within normal range     Starting 11/20/2015, I will continue to monitor intake of simple carbohydrates by focusing on eating lower glycemic foods and drinking diet soda.       Fall Risk Fall Risk  11/20/2015  Falls in the past year? No   Depression Screen PHQ 2/9 Scores 11/20/2015  PHQ - 2 Score 0    Cognitive Testing MMSE - Mini Mental State Exam 11/20/2015    Orientation to time 5  Orientation to Place 5  Registration 3  Attention/ Calculation 0  Recall 3  Language- name 2 objects 0  Language- repeat 1  Language- follow 3 step command 3  Language- read & follow direction 0  Write a sentence 0  Copy design 0  Total score 20   PLEASE NOTE: A Mini-Cog screen was completed. Maximum score is 20. A value of 0 denotes this part of [facility] MMSE was not completed or the patient failed this part of the Mini-Cog screening.  Mini-Cog Screening Orientation to Time - Max 5 pts Orientation to Place - Max 5 pts Registration - Max 3 pts Recall - Max 3 pts Language Repeat - Max 1 pts Language Follow 3 Step Command - Max 3 pts  Immunization History  Administered Date(s) Administered  . Influenza,inj,Quad PF,36+ Mos 07/16/2013, 04/21/2014, 06/02/2015  . Pneumococcal Conjugate-13 10/20/2014  . Pneumococcal Polysaccharide-23 12/29/2011  . Td 01/19/2010   Screening Tests Health Maintenance  Topic Date Due  . FOOT EXAM  04/10/2016 (Originally 10/20/2015)  . OPHTHALMOLOGY EXAM  04/10/2016 (Originally 08/11/2014)  . ZOSTAVAX  11/19/2016 (Originally 04/16/2005)  . INFLUENZA VACCINE  02/09/2016  . HEMOGLOBIN A1C  05/22/2016  . TETANUS/TDAP  01/20/2020  . COLONOSCOPY  12/16/2020  . Hepatitis C Screening  Completed  . PNA vac Low Risk Adult  Completed      Plan:      I have personally reviewed and addressed the Medicare Annual Wellness questionnaire and have noted the following in the patient's chart:  A. Medical and social history B. Use of alcohol, tobacco or illicit drugs  C. Current medications and supplements D. Functional ability and status E.  Nutritional status F.  Physical activity G. Advance directives H. List of other physicians I.  Hospitalizations, surgeries, and ER visits in previous 12 months J.  [place] to include hearing, vision, cognitive, depression L. Referrals and appointments - none  In addition, I have  reviewed and discussed with patient certain preventive protocols, quality metrics, and best practice recommendations. A written personalized care plan for preventive services as well as general preventive health recommendations were provided to patient.  See attached scanned questionnaire for additional information.   Signed,   Lindell Noe, MHA, BS, LPN Health Advisor [id number]/[id number]

## 2015-11-20 NOTE — Progress Notes (Signed)
I reviewed health advisor's note, was available for consultation, and agree with documentation and plan.  

## 2015-11-20 NOTE — Progress Notes (Signed)
PCP notes:  Health maintenance: A1C and Hep C screening completed with lab work. Pt plans to schedule eye exam in near future. Pt states vascular specialist performs foot exam. Shingles vaccine postponed at this time.   Abnormal screenings:  Hearing - failed. Please assess pt during next visit on 12/01/15 @ 4PM.

## 2015-11-21 LAB — HEPATITIS C ANTIBODY: HCV Ab: NEGATIVE

## 2015-12-01 ENCOUNTER — Encounter: Payer: Self-pay | Admitting: Family Medicine

## 2015-12-01 ENCOUNTER — Ambulatory Visit (INDEPENDENT_AMBULATORY_CARE_PROVIDER_SITE_OTHER): Payer: Medicare Other | Admitting: Family Medicine

## 2015-12-01 VITALS — BP 122/72 | HR 64 | Temp 98.2°F | Ht 68.0 in | Wt 202.5 lb

## 2015-12-01 DIAGNOSIS — I10 Essential (primary) hypertension: Secondary | ICD-10-CM | POA: Diagnosis not present

## 2015-12-01 DIAGNOSIS — E785 Hyperlipidemia, unspecified: Secondary | ICD-10-CM | POA: Diagnosis not present

## 2015-12-01 DIAGNOSIS — E1151 Type 2 diabetes mellitus with diabetic peripheral angiopathy without gangrene: Secondary | ICD-10-CM | POA: Diagnosis not present

## 2015-12-01 MED ORDER — NITROGLYCERIN 0.4 MG SL SUBL: 0.4000 mg | SUBLINGUAL_TABLET | SUBLINGUAL | Status: DC | PRN

## 2015-12-01 NOTE — Patient Instructions (Addendum)
Call about an eye exam.   Recheck labs in about 3 months before a visit.  Check with your insurance to see if they will cover the shingles shot. Check your sugar before and then 2 hours after the meal.  Please update me.  Take care.  Glad to see you.

## 2015-12-01 NOTE — Progress Notes (Signed)
Pre visit review using our clinic review tool, if applicable. No additional management support is needed unless otherwise documented below in the visit note.  HCV neg, d/w pt.   He declined hearing aids/eval.    Diabetes:  Using medications without difficulties:yes Hypoglycemic episodes: no Hyperglycemic episodes:no Feet problems:no Blood Sugars averaging: ~120-150 in the AM usually, rarely higher.   eye exam within last year: due, d/w pt.   A1c up, d/w pt.  He didn't know if this was accurate.    Hypertension:    Using medication without problems or lightheadedness: yes Chest pain with exertion:no Edema:minimal leg edema after bypass Short of breath:no  Elevated Cholesterol: Using medications without problems: noted to have some nocturnal cramps in his legs at night when he stretches, not during the day.  He has vascular f/u pending.  He doesn't have claudication.   Muscle aches: no Diet compliance:yes Exercise:some, encouraged more.   PMH and SH reviewed.   Vital signs, Meds and allergies reviewed.  ROS: Per HPI unless specifically indicated in ROS section   GEN: nad, alert and oriented HEENT: mucous membranes moist NECK: supple w/o LA CV: rrr.  no murmur PULM: ctab, no inc wob ABD: soft, +bs EXT: trace L foot edema, none on R foot SKIN: no acute rash Hands with B PIP and DIP chronic OA changes noted.   Diabetic foot exam: Normal inspection No skin breakdown No calluses  Normal DP pulse on R foot, slight dec in DP pulse L foot Normal sensation to light tough and monofilament Nails normal

## 2015-12-02 NOTE — Assessment & Plan Note (Signed)
Controlled no change in meds.  D/w pt.  He agrees.

## 2015-12-02 NOTE — Assessment & Plan Note (Signed)
D/w pt to  Call about an eye exam.  Recheck labs in about 3 months before a visit.  Check your sugar before and then 2 hours after the meal and then to update me.  We'll go from there.   No med changes yet o/w.   We can recheck Cr with next set of labs, since mildly elevated now, compared to baseline.

## 2015-12-02 NOTE — Assessment & Plan Note (Signed)
Continue fenofibrate and statin, continue work on diet and exercise for sugar and TG, d/w pt.

## 2015-12-08 IMAGING — CR DG CHEST 1V PORT
1 series · 1 of 1 positions shown · non-contrast
Comparison: 12/17/2012

CLINICAL DATA: Dyspnea, nausea

EXAM:
PORTABLE CHEST - 1 VIEW

[ap]
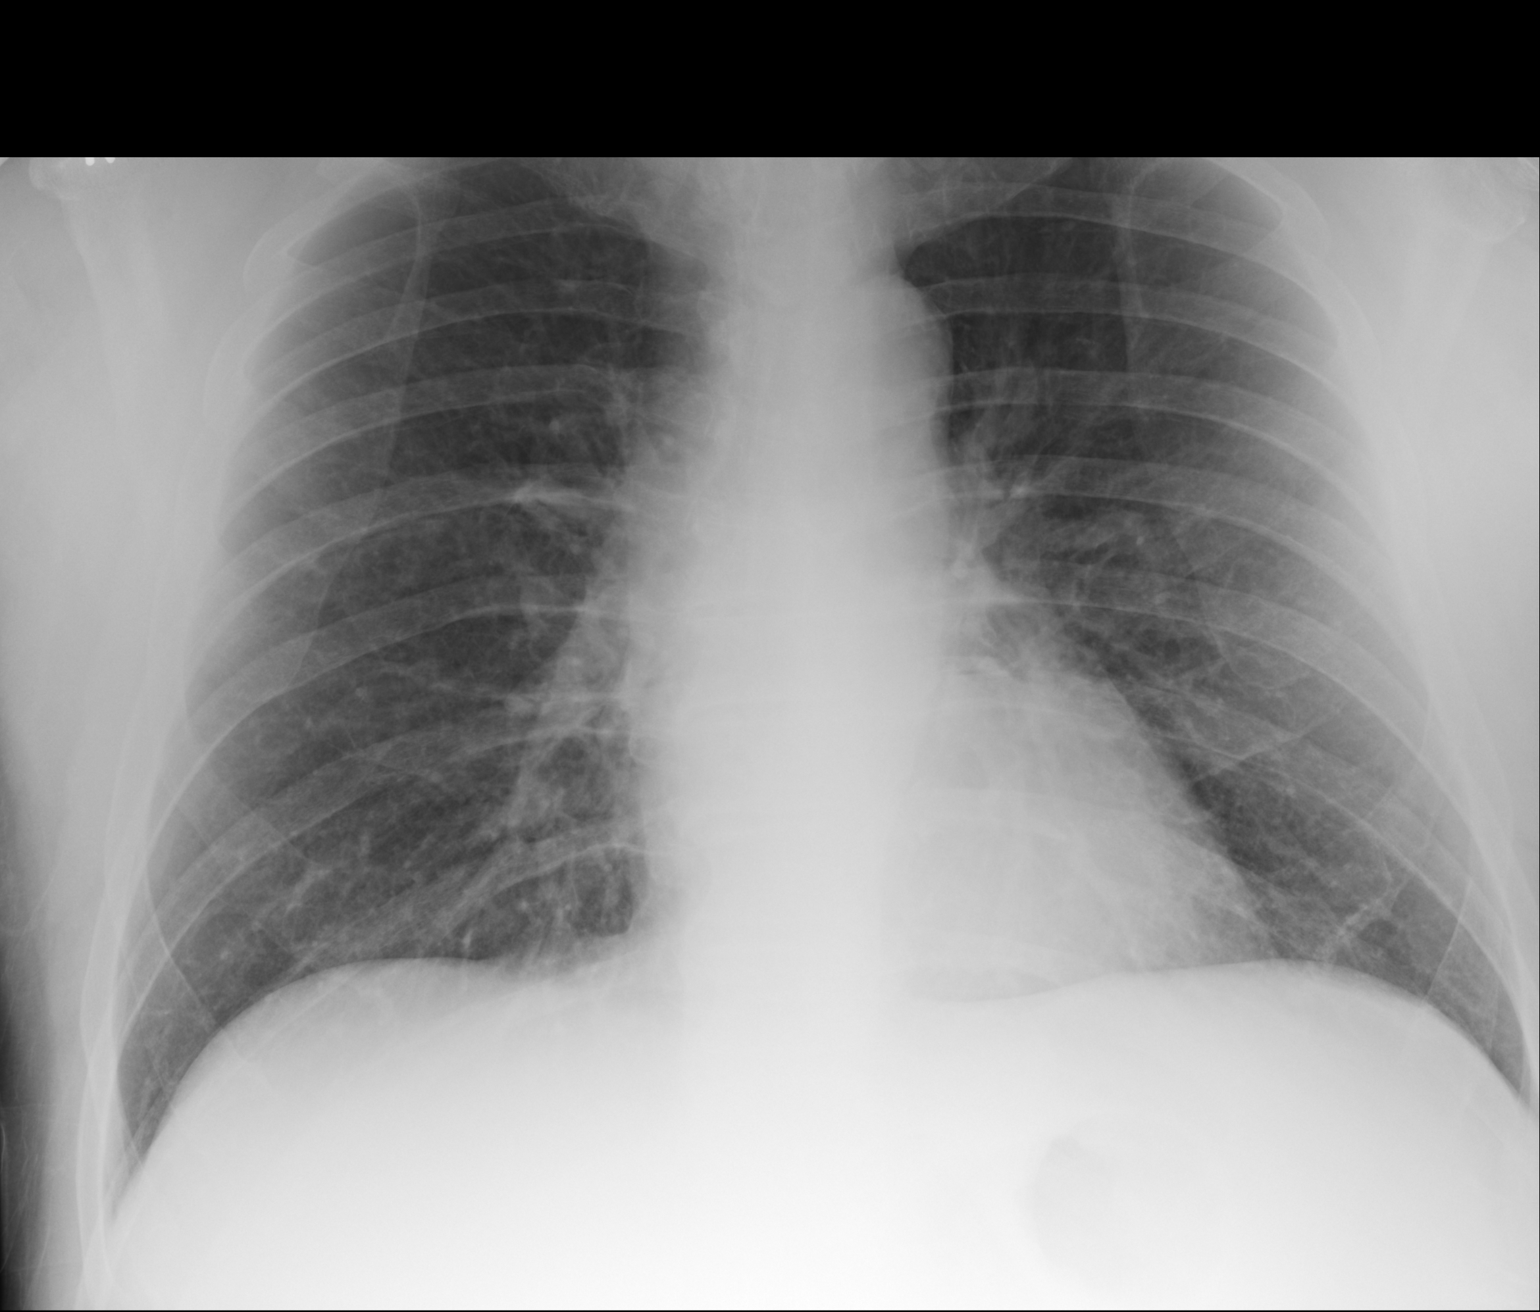

[1 of 1 positions shown; findings below may reference images not displayed]

FINDINGS: A single AP portable view of the chest demonstrates no focal
airspace consolidation or alveolar edema. The lungs are grossly
clear. There is no large effusion or pneumothorax. Cardiac and
mediastinal contours appear unremarkable.
IMPRESSION: No active disease.

## 2015-12-10 IMAGING — CR DG ABDOMEN 1V
1 series · 3 of 3 positions shown · non-contrast
Comparison: 08/31/2014

CLINICAL DATA: Abdominal distention and nausea, nasogastric tube,
surgery [REDACTED] for blood clot

EXAM:
ABDOMEN - 1 VIEW

[Series 1: dxr abdomen ap only · 0.14mm/px · 3 of 3 slices shown]
[im 1/3]
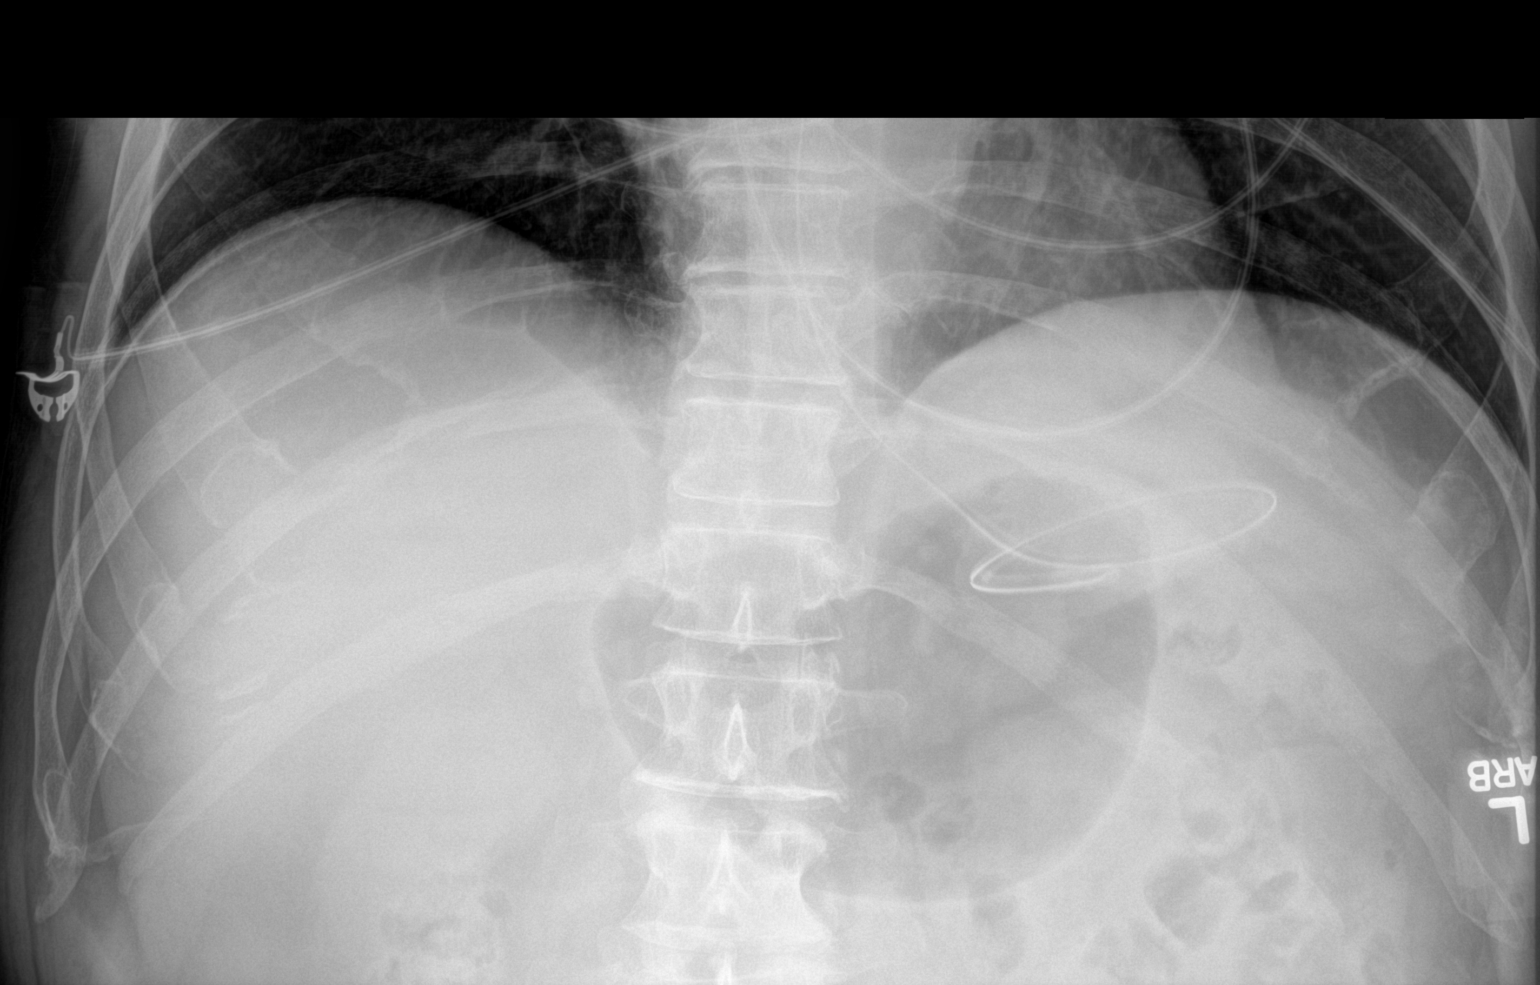
[im 2/3]
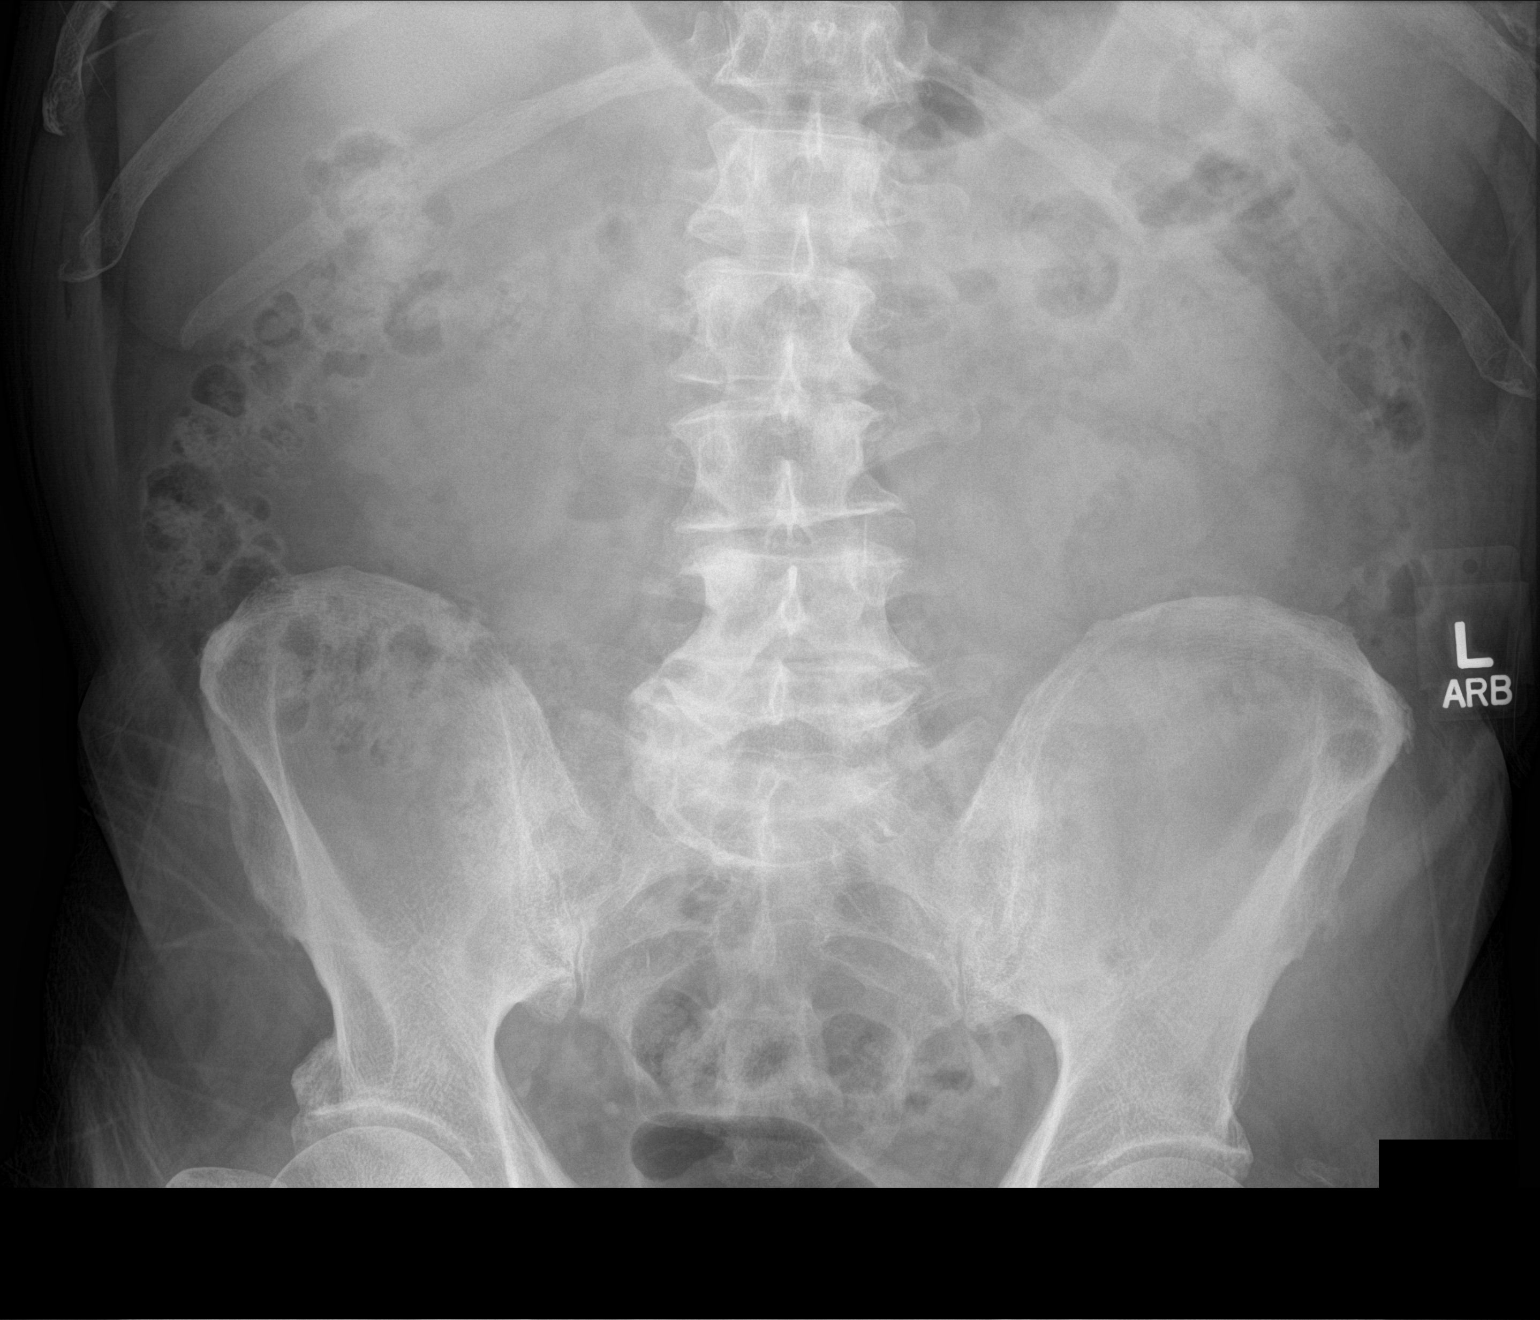
[im 3/3]
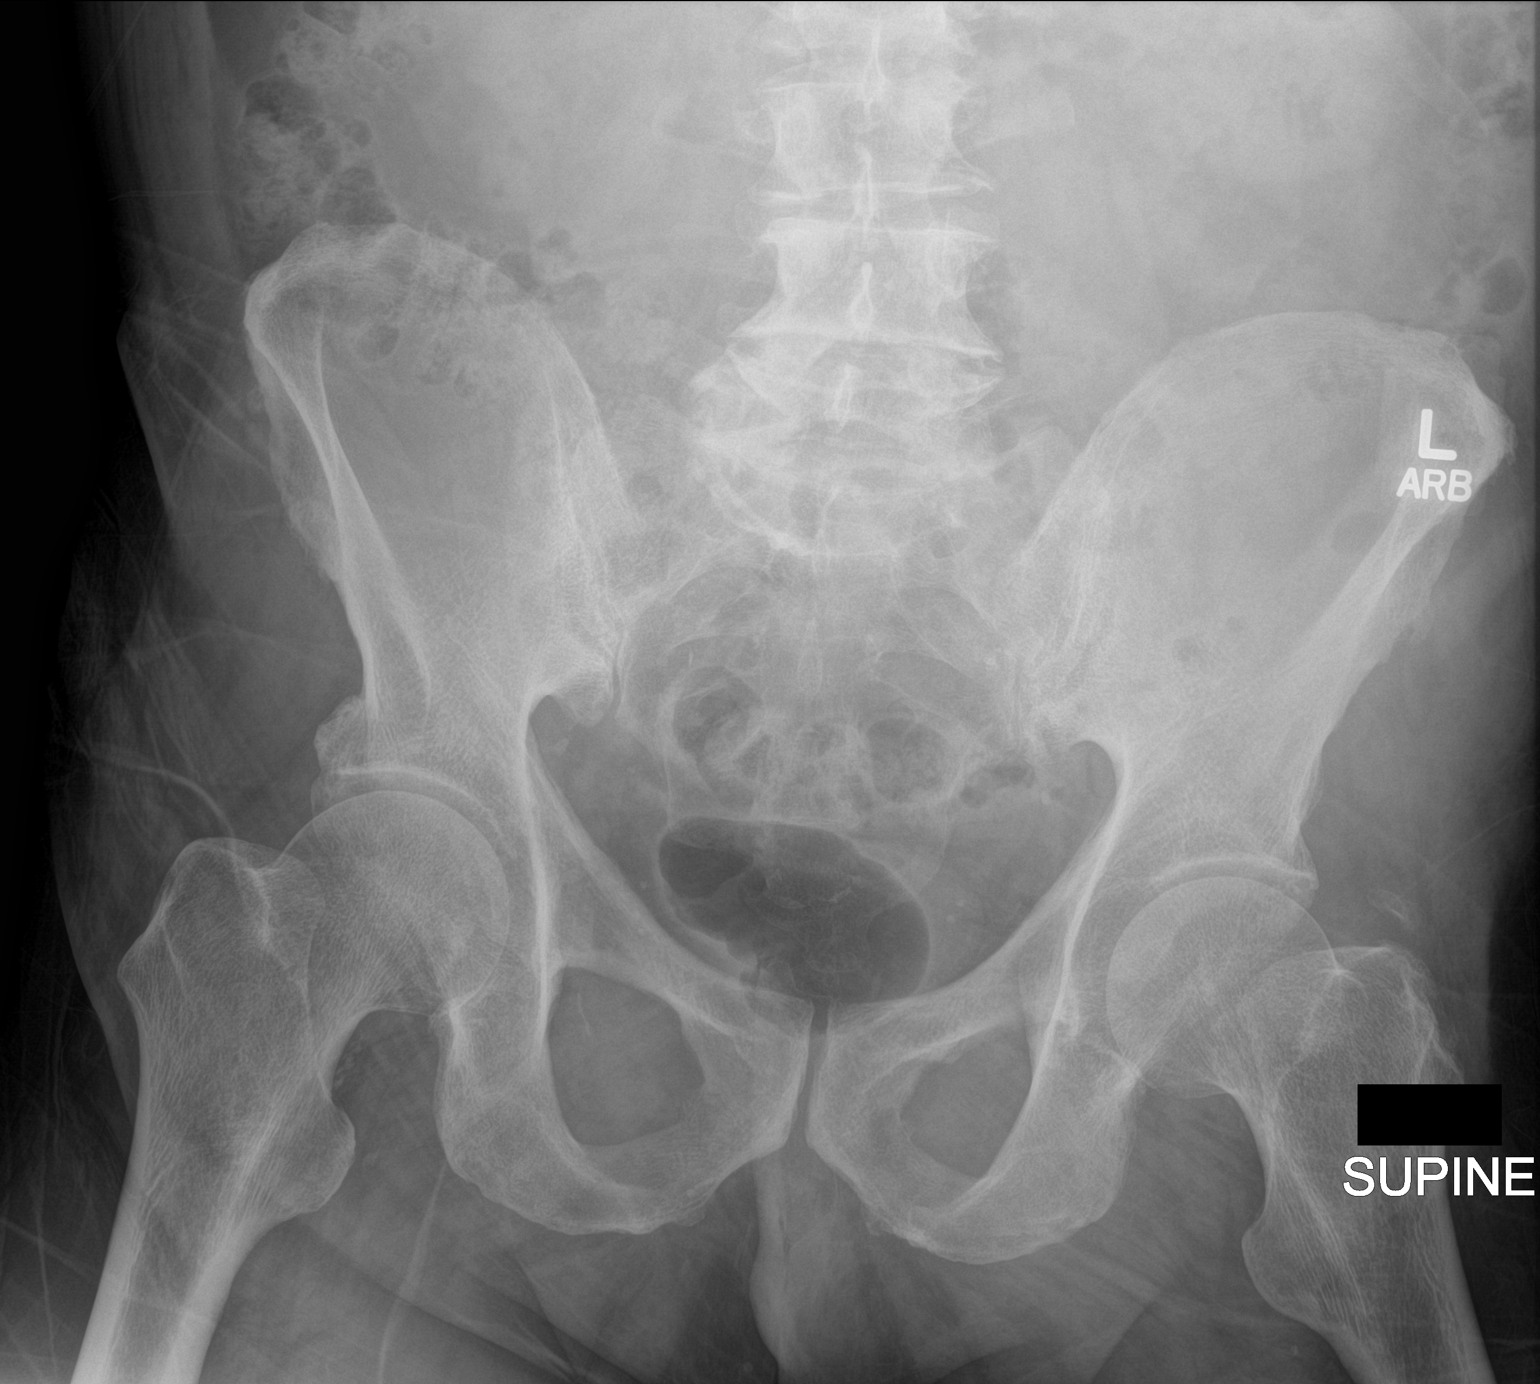

[3 of 3 positions shown; findings below may reference images not displayed]

FINDINGS: Gastric distention seen on previous exam has decreased.

Nasogastric tube coiled in proximal stomach.

Normal gas pattern within large and small bowel loops.

Gas present in rectum.

No bowel wall thickening or dilatation.

Lung bases clear.

No urinary tract calcification.

Bones demineralized with degenerative disc disease changes of the
lumbar spine.
IMPRESSION: Decreased gastric distention since previous exam.

## 2015-12-30 DIAGNOSIS — E785 Hyperlipidemia, unspecified: Secondary | ICD-10-CM | POA: Diagnosis not present

## 2015-12-30 DIAGNOSIS — I890 Lymphedema, not elsewhere classified: Secondary | ICD-10-CM | POA: Diagnosis not present

## 2015-12-30 DIAGNOSIS — E119 Type 2 diabetes mellitus without complications: Secondary | ICD-10-CM | POA: Diagnosis not present

## 2015-12-30 DIAGNOSIS — I2510 Atherosclerotic heart disease of native coronary artery without angina pectoris: Secondary | ICD-10-CM | POA: Diagnosis not present

## 2015-12-30 DIAGNOSIS — L97209 Non-pressure chronic ulcer of unspecified calf with unspecified severity: Secondary | ICD-10-CM | POA: Diagnosis not present

## 2015-12-30 DIAGNOSIS — M25569 Pain in unspecified knee: Secondary | ICD-10-CM | POA: Diagnosis not present

## 2015-12-30 DIAGNOSIS — M7989 Other specified soft tissue disorders: Secondary | ICD-10-CM | POA: Diagnosis not present

## 2015-12-30 DIAGNOSIS — I724 Aneurysm of artery of lower extremity: Secondary | ICD-10-CM | POA: Diagnosis not present

## 2015-12-30 DIAGNOSIS — I739 Peripheral vascular disease, unspecified: Secondary | ICD-10-CM | POA: Diagnosis not present

## 2015-12-30 DIAGNOSIS — I10 Essential (primary) hypertension: Secondary | ICD-10-CM | POA: Diagnosis not present

## 2015-12-30 DIAGNOSIS — I714 Abdominal aortic aneurysm, without rupture: Secondary | ICD-10-CM | POA: Diagnosis not present

## 2015-12-30 DIAGNOSIS — M79609 Pain in unspecified limb: Secondary | ICD-10-CM | POA: Diagnosis not present

## 2016-01-10 ENCOUNTER — Other Ambulatory Visit: Payer: Self-pay | Admitting: Family Medicine

## 2016-01-10 DIAGNOSIS — I779 Disorder of arteries and arterioles, unspecified: Secondary | ICD-10-CM

## 2016-01-10 DIAGNOSIS — I739 Peripheral vascular disease, unspecified: Principal | ICD-10-CM

## 2016-01-10 NOTE — Progress Notes (Signed)
Due for f/u carotid study.  Ordered.  Thanks.

## 2016-01-11 NOTE — Progress Notes (Signed)
Patient advised and states it has already been scheduled.

## 2016-01-18 ENCOUNTER — Other Ambulatory Visit: Payer: Self-pay | Admitting: Cardiology

## 2016-01-29 ENCOUNTER — Ambulatory Visit: Payer: Medicare Other

## 2016-01-29 DIAGNOSIS — I739 Peripheral vascular disease, unspecified: Principal | ICD-10-CM

## 2016-01-29 DIAGNOSIS — I6523 Occlusion and stenosis of bilateral carotid arteries: Secondary | ICD-10-CM | POA: Diagnosis not present

## 2016-01-29 DIAGNOSIS — I779 Disorder of arteries and arterioles, unspecified: Secondary | ICD-10-CM

## 2016-02-01 IMAGING — CR DG CHEST 2V
1 series · 2 of 2 positions shown · non-contrast
Comparison: 08/30/14

CLINICAL DATA: Chest pain. Posterior pain between shoulder blades
or shortness of breath

EXAM:
CHEST  2 VIEW

[Series 1: w chest pa · 0.14mm/px · 2 of 2 slices shown]
[im 1/2]
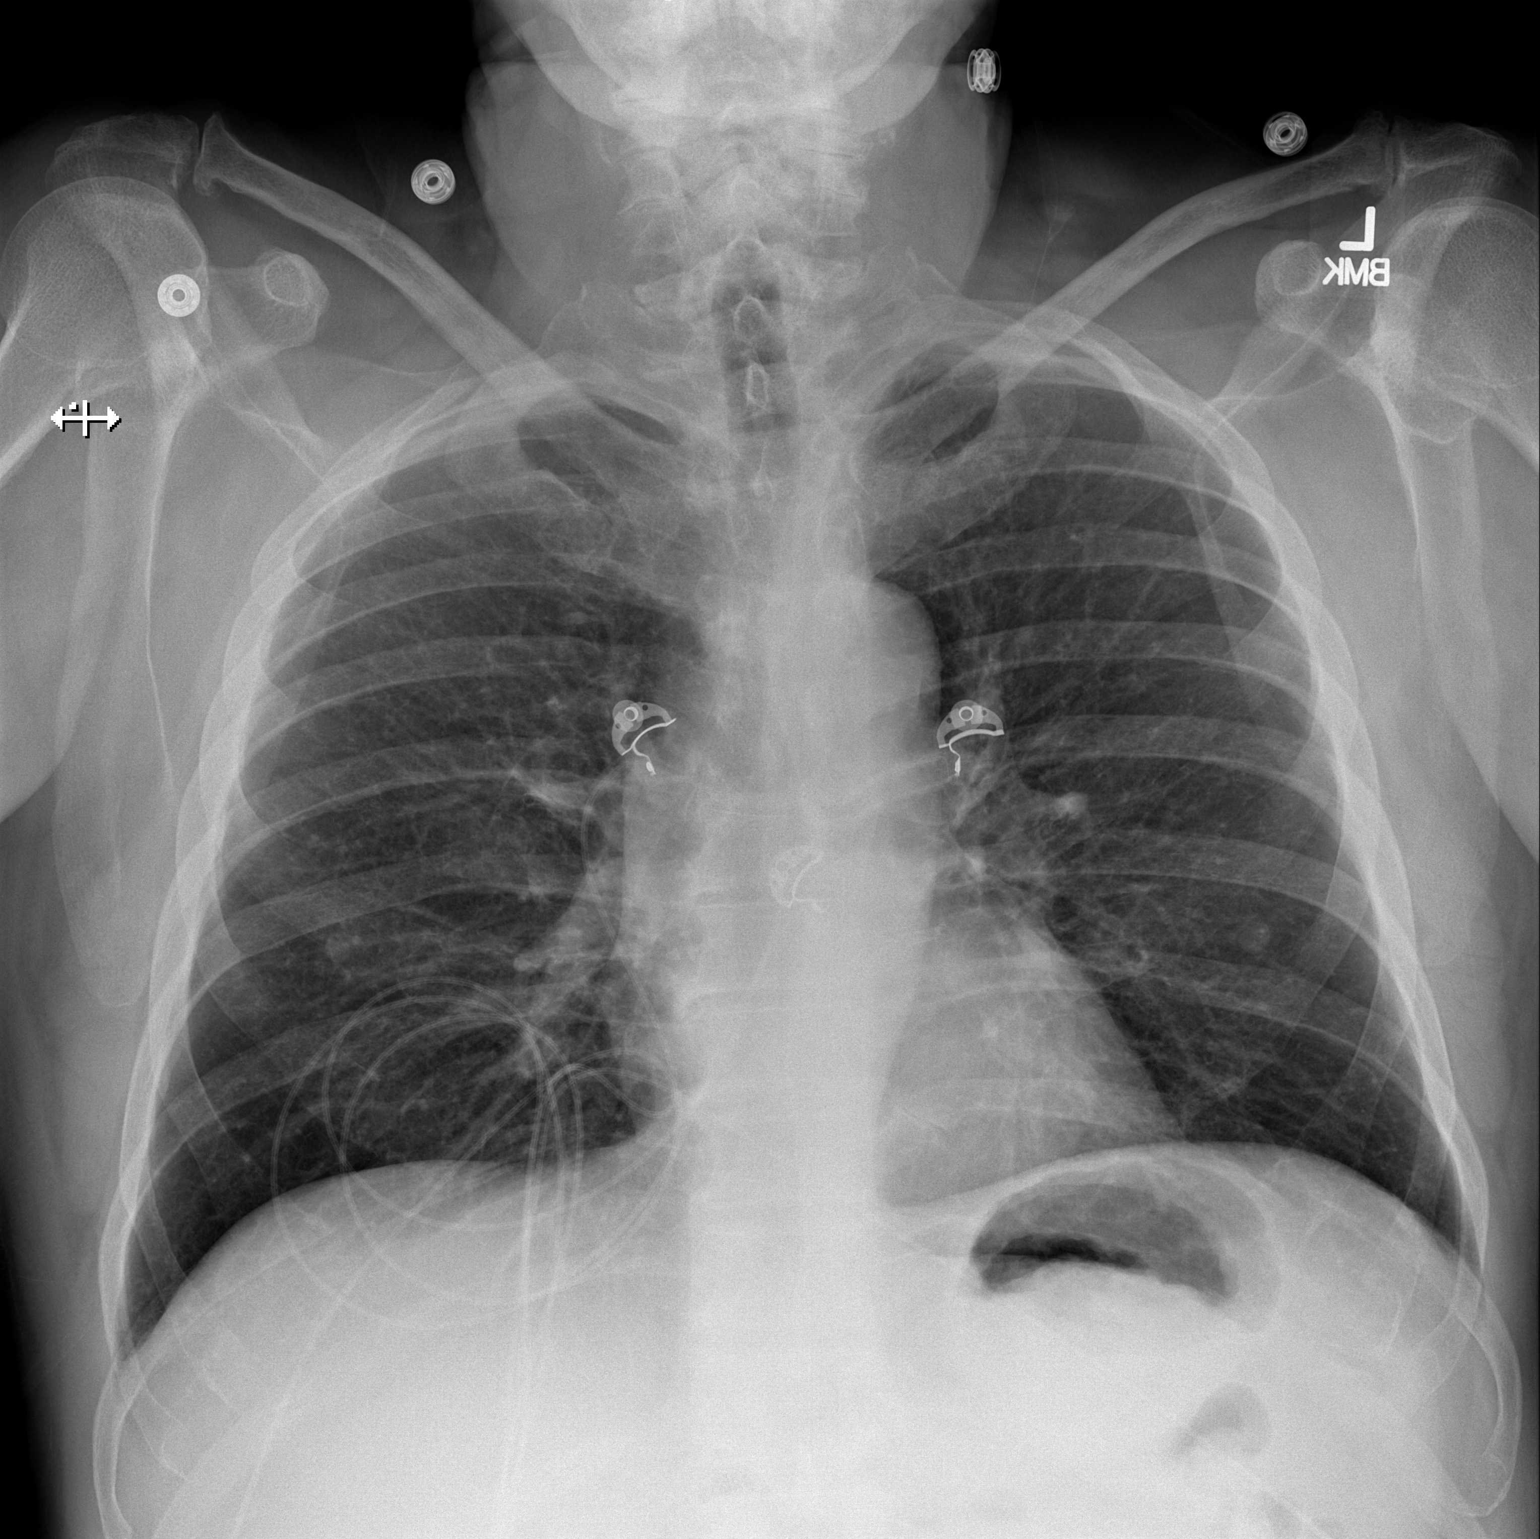
[im 2/2]
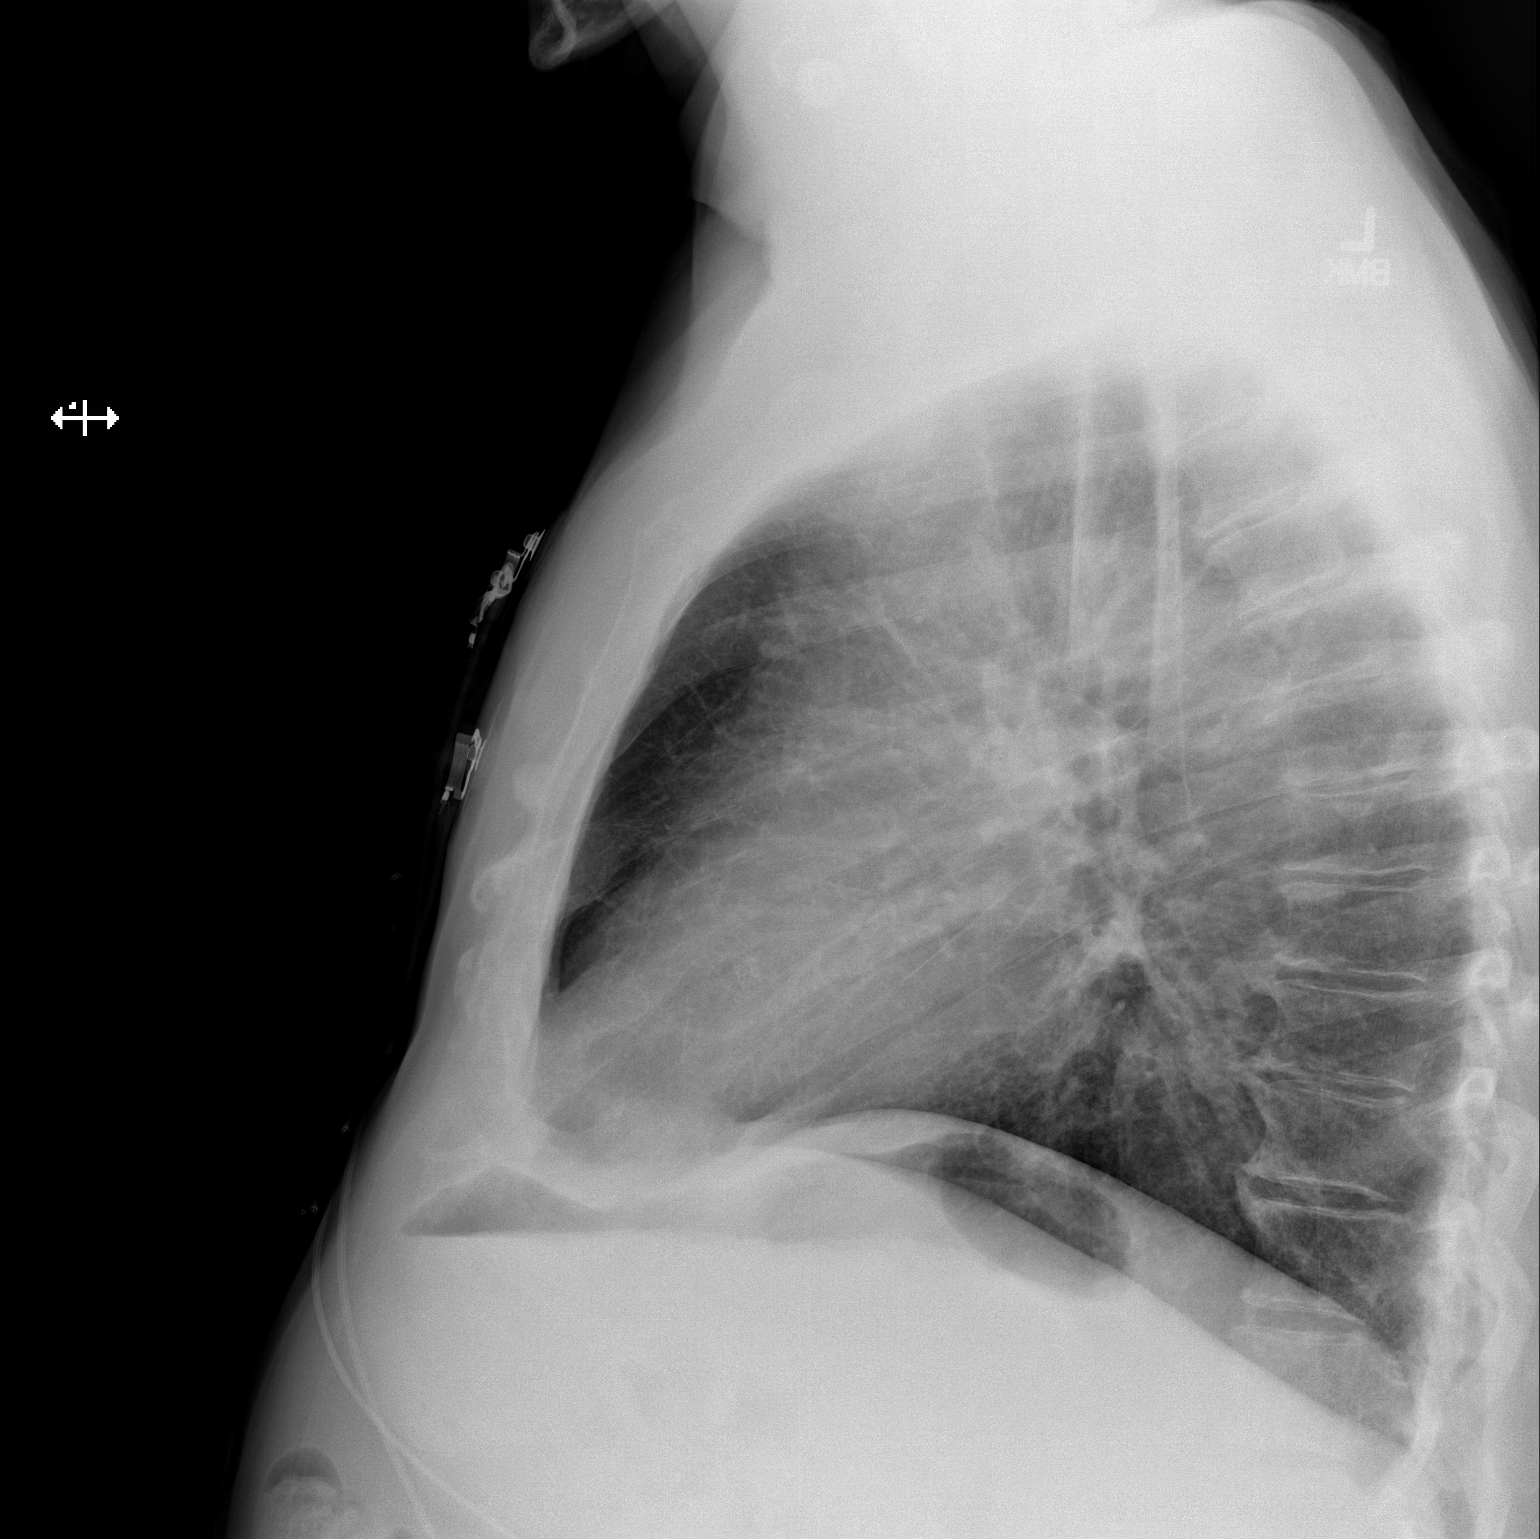

[2 of 2 positions shown; findings below may reference images not displayed]

FINDINGS: Borderline hyperinflation.The cardiomediastinal contours are normal.
The lungs are clear. Bilateral nipple shadows are noted. Pulmonary
vasculature is normal. No consolidation, pleural effusion, or
pneumothorax. No acute osseous abnormalities are seen.
IMPRESSION: Borderline hyperinflation, no acute process.

## 2016-03-01 ENCOUNTER — Telehealth: Payer: Self-pay | Admitting: Family Medicine

## 2016-03-01 NOTE — Telephone Encounter (Signed)
Would stay on aspirin Ok to hold brilinta for 7 days prior to surgery Restart after surgery

## 2016-03-01 NOTE — Telephone Encounter (Signed)
Routed to cards for input.   Dr. Mariah MillingGollan, could/should patient come off ASA and/or brilinta for any period of time?  Thanks.

## 2016-03-01 NOTE — Telephone Encounter (Signed)
Pt left VM on Triage phone, he is having several teeth extracted next week and he is on blood thinners, and he wants to know what he needs to do.  His appointment is on Thursday 03/10/16.  Please advise.

## 2016-03-02 NOTE — Telephone Encounter (Signed)
Reviewed recommendations from Dr. Mariah MillingGollan with patients wife per release form. Let her know to have him remain on aspirin and hold Brilinta for 7 days prior to surgery and restart after surgery. Also forwarded note back to Dr. Para Marchuncan for his review. She verbalized understanding of instructions and had no further questions at this time.

## 2016-03-03 NOTE — Telephone Encounter (Signed)
Thanks

## 2016-03-21 DIAGNOSIS — E113393 Type 2 diabetes mellitus with moderate nonproliferative diabetic retinopathy without macular edema, bilateral: Secondary | ICD-10-CM | POA: Diagnosis not present

## 2016-04-11 ENCOUNTER — Other Ambulatory Visit: Payer: Self-pay | Admitting: Cardiovascular Disease

## 2016-05-22 ENCOUNTER — Other Ambulatory Visit: Payer: Self-pay | Admitting: Cardiovascular Disease

## 2016-05-25 ENCOUNTER — Telehealth: Payer: Self-pay | Admitting: Cardiovascular Disease

## 2016-05-25 MED ORDER — FENOFIBRATE 145 MG PO TABS: 145.0000 mg | ORAL_TABLET | Freq: Every day | ORAL | 3 refills | Status: DC

## 2016-05-25 NOTE — Telephone Encounter (Signed)
°*  STAT* If patient is at the pharmacy, call can be transferred to refill team.   1. Which medications need to be refilled? (please list name of each medication and dose if known) Fenofibrate 145 mg po daily   2. Which pharmacy/location (including street and city if local pharmacy) is medication to be sent to? [facility] [address]   3. Do they need a 30 day or 90 day supply? 90

## 2016-06-10 ENCOUNTER — Ambulatory Visit (INDEPENDENT_AMBULATORY_CARE_PROVIDER_SITE_OTHER): Payer: Medicare Other | Admitting: Cardiovascular Disease

## 2016-06-10 ENCOUNTER — Encounter: Payer: Self-pay | Admitting: Cardiovascular Disease

## 2016-06-10 VITALS — BP 130/82 | HR 61 | Ht 68.0 in | Wt 204.0 lb

## 2016-06-10 DIAGNOSIS — E782 Mixed hyperlipidemia: Secondary | ICD-10-CM

## 2016-06-10 DIAGNOSIS — I739 Peripheral vascular disease, unspecified: Secondary | ICD-10-CM

## 2016-06-10 DIAGNOSIS — I779 Disorder of arteries and arterioles, unspecified: Secondary | ICD-10-CM | POA: Diagnosis not present

## 2016-06-10 DIAGNOSIS — I10 Essential (primary) hypertension: Secondary | ICD-10-CM

## 2016-06-10 DIAGNOSIS — I2510 Atherosclerotic heart disease of native coronary artery without angina pectoris: Secondary | ICD-10-CM | POA: Diagnosis not present

## 2016-06-10 DIAGNOSIS — Z23 Encounter for immunization: Secondary | ICD-10-CM | POA: Diagnosis not present

## 2016-06-10 DIAGNOSIS — J432 Centrilobular emphysema: Secondary | ICD-10-CM

## 2016-06-10 DIAGNOSIS — I214 Non-ST elevation (NSTEMI) myocardial infarction: Secondary | ICD-10-CM

## 2016-06-10 MED ORDER — TICAGRELOR 60 MG PO TABS: 60.0000 mg | ORAL_TABLET | Freq: Two times a day (BID) | ORAL | 11 refills | Status: DC

## 2016-06-10 NOTE — Patient Instructions (Addendum)
Medication Instructions:   Flu shot today  Labwork:  No new labs needed  Testing/Procedures:  No further testing at this time   I recommend watching educational videos on topics of interest to you at:       [email or web address]  Enter code: [email or web address]    Follow-Up: It was a pleasure seeing you in the office today. Please call us if you have new issues that need to be addressed before your next appt.  [phone]  Your physician wants you to follow-up in: 12 months.  You will receive a reminder letter in the mail two months in advance. If you don't receive a letter, please call our office to schedule the follow-up appointment.  If you need a refill on your cardiac medications before your next appointment, please call your pharmacy.

## 2016-06-10 NOTE — Progress Notes (Addendum)
Cardiology Office Note  Date:  06/10/2016   ID:  [name], DOB July 15, 1945, MRN [id number]  PCP:  Elsie Stain, MD   Chief Complaint  Patient presents with  . other    37mof/u. Pt states he is doing well. Reviewed meds with pt verbally.    HPI:  [name] a pleasant 70year old gentleman with history of coronary artery disease, diabetes, PAD, prior stent to the LAD and RCA, left femoropopliteal bypass with repair of aneurysm, long history of smoking for 50 years,  non-STEMI 10/24/2014 who presents for routine follow-up of his coronary artery disease.  previous anginal equivalent, scapular pain between his shoulder blades. 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 Occasionally when he is very stressed has some central back pain, does not last Wife does his medications  Lab work reviewed with him  LDL 57,  HBA1C 9.1 up from 7.1   reports that he has close follow-up withDr dew/schneir  Head ultrasound of his lower abdominal area into the legs in the past several months  Carotid u/s: 40 to 59% left in 01/2016  EKG on today's visit shows normal sinus rhythm with rate 61 bpm,  No significant ST or T-wave changes   Other past medical history   presented to the hospital April 15 with pain similar to his previous anginal equivalent, scapular pain between his shoulder blades. He had cardiac catheterization April 18 that showed chronically occluded mid LAD, severe disease of the distal RCA as well as PDA branch. He had DES to the distal RCA. In the holding area,  he developed chest discomfort and was taken back to the cardiac catheterization lab that showed occluded PDA lesion which was stented. Discharged home on 11/01/2014   PMH:   has a past medical history of CAD (coronary artery disease); COPD (chronic obstructive pulmonary disease) ([place]; DM2 (diabetes mellitus, type 2) ([place]; H/O hiatal hernia; HTN (hypertension); Hypercholesterolemia; Impaired fasting glucose; Osteoarthritis of hip; Osteoarthritis, knee; PAD (peripheral artery disease) ([place]; Peri-rectal abscess (12/27/2011); and Tobacco abuse.  PSH:    Past Surgical History:  Procedure Laterality Date  . CORONARY ANGIOPLASTY    . [place] 2006   Multiple, LAD in 1999, RCA in 2005  . FEMORAL-POPLITEAL BYPASS GRAFT  2016   L fem to below the knee popliteal vein bypass graft  . INCISE AND DRAIN ABCESS  2007   on scrotum  . INCISION AND DRAINAGE PERIRECTAL ABSCESS  12/28/2011   Procedure: IRRIGATION AND DEBRIDEMENT PERIRECTAL ABSCESS;  Surgeon: PMerrie Roof MD;  [facility]  Service: General;  Laterality: N/A;    Current Outpatient Prescriptions  Medication Sig Dispense Refill  . albuterol (PROAIR HFA) 108 (90 BASE) MCG/ACT inhaler Inhale 2 puffs into the lungs 3 (three) times daily as needed (cough). 18 g 2  . aspirin 81 MG EC tablet Take 1 tablet (81 mg total) by mouth daily. 30 tablet 0  . atorvastatin (LIPITOR) 40 MG tablet TAKE ONE TABLET BY MOUTH ONCE DAILY 30 tablet 11  .  benzonatate (TESSALON) 200 MG capsule TAKE ONE CAPSULE BY MOUTH THREE TIMES DAILY AS NEEDED 30 capsule 0  . Blood Glucose Monitoring Suppl (RELION CONFIRM GLUCOSE MONITOR) W/DEVICE KIT Check blood sugar once daily as needed.  Diagnosis:  250.00  Non-insulin dependent 1 kit 0  . fenofibrate (TRICOR) 145 MG tablet Take 1 tablet (145 mg total) by mouth daily. 90 tablet 3  . fluticasone (FLONASE) 50 MCG/ACT nasal spray Place 2 sprays into the nose daily.    Marland Kitchen glucose blood (RELION  CONFIRM/MICRO TEST) test strip Use as instructed to test blood sugar once daily as needed.  Diagnosis:  250.00  Non insulin-dependent. 100 each 3  . hydrochlorothiazide (HYDRODIURIL) 25 MG tablet TAKE ONE TABLET BY MOUTH ONCE DAILY 90 tablet 3  . lisinopril (PRINIVIL,ZESTRIL) 40 MG tablet TAKE ONE TABLET BY MOUTH ONCE DAILY 30 tablet 11  . meclizine (ANTIVERT) 25 MG tablet Take 25 mg by mouth every 6 (six) hours as needed. For dizziness/vertigo    . metFORMIN (GLUCOPHAGE) 500 MG tablet Take 3 tablets (1,500 mg total) by mouth daily.    . metoprolol tartrate (LOPRESSOR) 25 MG tablet TAKE ONE-HALF TABLET BY MOUTH TWICE DAILY 60 tablet 6  . nitroGLYCERIN (NITROSTAT) 0.4 MG SL tablet Place 1 tablet (0.4 mg total) under the tongue every 5 (five) minutes as needed. For chest pain 25 tablet 12  . RELION LANCETS MICRO-THIN 33G MISC Use to test blood sugar once daily as needed.  Diagnosis:  250.00   Non insulin dependent. 100 each 3  . SYMBICORT 160-4.5 MCG/ACT inhaler INHALE TWO PUFFS BY MOUTH IN THE MORNING 11 g 5  . ticagrelor (BRILINTA) 60 MG TABS tablet Take 1 tablet (60 mg total) by mouth 2 (two) times daily. 60 tablet 11  . traMADol (ULTRAM) 50 MG tablet Take 1 tablet (50 mg total) by mouth every 6 (six) hours as needed. 30 tablet 0   No current facility-administered medications for this visit.      Allergies:   Augmentin [amoxicillin-pot clavulanate]; Metformin and related; and Morphine and related   Social History:  The patient  reports that he quit smoking about 17 years ago. His smoking use included Cigarettes. He has a 40.00 pack-year smoking history. He has never used smokeless tobacco. He reports that he drinks alcohol. He reports that he does not use drugs.   Family History:   family history includes Heart failure in his father.    Review of Systems: Review of Systems  Constitutional: Negative.   Respiratory: Negative.   Cardiovascular: Negative.   Gastrointestinal: Negative.    Musculoskeletal: Negative.   Neurological: Negative.   Psychiatric/Behavioral: Negative.   All other systems reviewed and are negative.    PHYSICAL EXAM: VS:  BP 130/82 (BP Location: Left Arm, Patient Position: Sitting, Cuff Size: Normal)   Pulse 61   Ht 5' 8"  (1.727 m)   Wt 204 lb (92.5 kg)   BMI 31.02 kg/m  , BMI Body mass index is 31.02 kg/m. GEN: Well nourished, well developed, in no acute distress  HEENT: normal  Neck: no JVD, carotid bruits, or masses Cardiac: RRR; no murmurs, rubs, or gallops,no edema  Respiratory:  Mildly decreased breath sounds throughout, normal work of breathing GI: soft, nontender, nondistended, + BS MS: no deformity or atrophy  Skin: warm and dry, no rash Neuro:  Strength and sensation are intact Psych: euthymic mood, full affect    Recent Labs: 11/20/2015: ALT 26; BUN 24; Creatinine, Ser 1.49; Potassium 4.4; Sodium  139    Lipid Panel Lab Results  Component Value Date   CHOL 111 11/20/2015   HDL 19.70 (L) 11/20/2015   LDLCALC SEE COMMENT 10/25/2014   TRIG 263.0 (H) 11/20/2015      Wt Readings from Last 3 Encounters:  06/10/16 204 lb (92.5 kg)  12/01/15 202 lb 8 oz (91.9 kg)  11/20/15 200 lb (90.7 kg)       ASSESSMENT AND PLAN:  Essential hypertension - Plan: EKG 12-Lead Blood pressure is well controlled on today's visit. No changes made to the medications.  Atherosclerosis of native coronary artery of native heart without angina pectoris - Plan: EKG 12-Lead Currently with no symptoms of angina. No further workup at this time. Continue current medication regimen.  NSTEMI (non-ST elevated myocardial infarction) ([place]) - Plan: EKG 12-Lead  Bilateral carotid artery disease ([place]) - Plan: EKG 12-Lead Recent carotid results discussed with him in detail Stressed the importance of aggressive diabetes control in an effort to slow his carotid stenosis on the left  Encounter for immunization - Plan: Flu Vaccine QUAD 36+ mos IM Flu  shot provided  Hyperlipidemia Cholesterol is at goal on the current lipid regimen. No changes to the medications were made.  Type 2 diabetes, poorly controlled With complications  Diet discussed with him in detail Recommended low carbohydrate diet  PAD: Previous lower extremity surgical procedures discussed with him, need for close monitoring, aggressive diabetes control  COPD: Currently on inhalers, reports his breathing is stable   Total encounter time more than 25 minutes  Greater than 50% was spent in counseling and coordination of care with the patient   Disposition:   F/U  6 months   Orders Placed This Encounter  Procedures  . Flu Vaccine QUAD 36+ mos IM  . EKG 12-Lead     Signed, Esmond Plants, M.D., Ph.D. 06/10/2016  [place], [place]

## 2016-06-12 ENCOUNTER — Other Ambulatory Visit: Payer: Self-pay | Admitting: Family Medicine

## 2016-06-20 ENCOUNTER — Other Ambulatory Visit: Payer: Self-pay | Admitting: Cardiovascular Disease

## 2016-07-06 ENCOUNTER — Other Ambulatory Visit (INDEPENDENT_AMBULATORY_CARE_PROVIDER_SITE_OTHER): Payer: Self-pay | Admitting: Vascular Surgery

## 2016-07-06 DIAGNOSIS — I739 Peripheral vascular disease, unspecified: Secondary | ICD-10-CM

## 2016-07-06 DIAGNOSIS — I724 Aneurysm of artery of lower extremity: Secondary | ICD-10-CM

## 2016-07-07 ENCOUNTER — Ambulatory Visit (INDEPENDENT_AMBULATORY_CARE_PROVIDER_SITE_OTHER): Payer: Self-pay | Admitting: Vascular Surgery

## 2016-07-07 ENCOUNTER — Ambulatory Visit (INDEPENDENT_AMBULATORY_CARE_PROVIDER_SITE_OTHER): Payer: Medicare Other | Admitting: Vascular Surgery

## 2016-07-07 ENCOUNTER — Ambulatory Visit (INDEPENDENT_AMBULATORY_CARE_PROVIDER_SITE_OTHER): Payer: Medicare Other

## 2016-07-07 ENCOUNTER — Encounter (INDEPENDENT_AMBULATORY_CARE_PROVIDER_SITE_OTHER): Payer: Self-pay | Admitting: Vascular Surgery

## 2016-07-07 VITALS — BP 117/71 | HR 59 | Resp 17 | Ht 68.0 in | Wt 202.0 lb

## 2016-07-07 DIAGNOSIS — I739 Peripheral vascular disease, unspecified: Secondary | ICD-10-CM

## 2016-07-07 DIAGNOSIS — I724 Aneurysm of artery of lower extremity: Secondary | ICD-10-CM

## 2016-07-07 DIAGNOSIS — I714 Abdominal aortic aneurysm, without rupture, unspecified: Secondary | ICD-10-CM

## 2016-07-07 DIAGNOSIS — J432 Centrilobular emphysema: Secondary | ICD-10-CM | POA: Diagnosis not present

## 2016-07-07 DIAGNOSIS — I10 Essential (primary) hypertension: Secondary | ICD-10-CM | POA: Diagnosis not present

## 2016-07-07 DIAGNOSIS — I2510 Atherosclerotic heart disease of native coronary artery without angina pectoris: Secondary | ICD-10-CM | POA: Diagnosis not present

## 2016-07-07 DIAGNOSIS — E1151 Type 2 diabetes mellitus with diabetic peripheral angiopathy without gangrene: Secondary | ICD-10-CM

## 2016-07-07 DIAGNOSIS — E782 Mixed hyperlipidemia: Secondary | ICD-10-CM | POA: Diagnosis not present

## 2016-07-07 NOTE — Progress Notes (Signed)
MRN : [id number]  [name] is a 70 y.o. ([birth year]) male who presents with chief complaint of  Chief Complaint  Patient presents with  . Follow-up  .  History of Present Illness: The patient returns to the office for followup and review status post fem BK pop bypass for thrombosed popliteal artery aneurysm. The patient notes improvement in the lower extremity symptoms. No interval shortening of the patient's claudication distance or rest pain symptoms. Previous wounds have now healed. No new ulcers or wounds have occurred since the last visit.  There have been no significant changes to the patient's overall health care.  The patient denies amaurosis fugax or recent TIA symptoms. There are no recent neurological changes noted. The patient denies history of DVT, PE or superficial thrombophlebitis. The patient denies recent episodes of angina or shortness of breath.  Duplex US of the lower extremity arterial system shows a 1.21 cm right popliteal artery aneurysm and a patent left fem pop bypass, with triphasic tibial signals ABI's continue to be normal bilaterally, Rt=1.15 and Lt=1.19  Previous Studies: Aortic duplex shows a 2.95 cm AAA Duplex US of the lower extremity arterial system shows a 1.26 cm right popliteal artery aneurysm and a patent left fem pop bypass, with triphasic tibial signals ABI's are normal bilaterally  Current Meds  Medication Sig  . albuterol (PROAIR HFA) 108 (90 BASE) MCG/ACT inhaler Inhale 2 puffs into the lungs 3 (three) times daily as needed (cough).  Marland Kitchen aspirin 81 MG EC tablet Take 1 tablet (81 mg total) by mouth daily.  Marland Kitchen atorvastatin (LIPITOR) 40 MG tablet TAKE ONE TABLET BY MOUTH ONCE DAILY  . benzonatate (TESSALON) 200 MG capsule TAKE ONE CAPSULE BY MOUTH THREE TIMES DAILY AS NEEDED  . Blood Glucose Monitoring Suppl (RELION CONFIRM GLUCOSE MONITOR) W/DEVICE KIT Check blood sugar once daily as needed.  Diagnosis:  250.00  Non-insulin dependent  .  fenofibrate (TRICOR) 145 MG tablet Take 1 tablet (145 mg total) by mouth daily.  . fluticasone (FLONASE) 50 MCG/ACT nasal spray Place 2 sprays into the nose daily.  Marland Kitchen glucose blood (RELION CONFIRM/MICRO TEST) test strip Use as instructed to test blood sugar once daily as needed.  Diagnosis:  250.00  Non insulin-dependent.  . hydrochlorothiazide (HYDRODIURIL) 25 MG tablet TAKE ONE TABLET BY MOUTH ONCE DAILY  . lisinopril (PRINIVIL,ZESTRIL) 40 MG tablet TAKE ONE TABLET BY MOUTH ONCE DAILY  . meclizine (ANTIVERT) 25 MG tablet Take 25 mg by mouth every 6 (six) hours as needed. For dizziness/vertigo  . metFORMIN (GLUCOPHAGE) 500 MG tablet Take 3 tablets (1,500 mg total) by mouth daily.  . metFORMIN (GLUCOPHAGE) 500 MG tablet TAKE TWO TABLETS BY MOUTH TWICE DAILY WITH  A  MEAL  . metoprolol tartrate (LOPRESSOR) 25 MG tablet TAKE ONE-HALF TABLET BY MOUTH TWICE DAILY  . nitroGLYCERIN (NITROSTAT) 0.4 MG SL tablet Place 1 tablet (0.4 mg total) under the tongue every 5 (five) minutes as needed. For chest pain  . RELION LANCETS MICRO-THIN 33G MISC Use to test blood sugar once daily as needed.  Diagnosis:  250.00   Non insulin dependent.  . SYMBICORT 160-4.5 MCG/ACT inhaler INHALE TWO PUFFS BY MOUTH IN THE MORNING  . ticagrelor (BRILINTA) 60 MG TABS tablet Take 1 tablet (60 mg total) by mouth 2 (two) times daily.  . traMADol (ULTRAM) 50 MG tablet Take 1 tablet (50 mg total) by mouth every 6 (six) hours as needed.    Past Medical History:  Diagnosis Date  .  CAD (coronary artery disease)    a. 1999: PCI-->LAD 2/2 MI; b. 2005: inf MI s/p PCI/DES x 4 to RCA; c. Myoview in 12/2005: EF 61%, no evidence for ischemia; d. cath 10/2014: occluded mLAD w/ L to L and L to R collats, dRCA 95% s/p PCI/DES 0%, mPDA 70%, LCx mild to mod irregs, procedure complicated by inf ST ele in recovery, repeat cath showed acute dRCA stent thrombosis o/w occluded mRPDA, PTCA dRCA, PCI/DES RPDA, aggrastat x 18 hr  . COPD (chronic  obstructive pulmonary disease) ([place])   . DM2 (diabetes mellitus, type 2) ([place])   . H/O hiatal hernia   . HTN (hypertension)   . Hypercholesterolemia   . Impaired fasting glucose    Elevated after steroid injection  . Osteoarthritis of hip   . Osteoarthritis, knee   . PAD (peripheral artery disease) ([facility])    L fem to below the knee popliteal vein bypass graft  . Peri-rectal abscess 12/27/2011  . Tobacco abuse    Prior    Past Surgical History:  Procedure Laterality Date  . CORONARY ANGIOPLASTY    . [place], 2006   Multiple, LAD in 1999, RCA in 2005  . FEMORAL-POPLITEAL BYPASS GRAFT  2016   L fem to below the knee popliteal vein bypass graft  . INCISE AND DRAIN ABCESS  2007   on scrotum  . INCISION AND DRAINAGE PERIRECTAL ABSCESS  12/28/2011   Procedure: IRRIGATION AND DEBRIDEMENT PERIRECTAL ABSCESS;  Surgeon: Merrie Roof, MD;  Location: [facility];  Service: General;  Laterality: N/A;    Social History Social History  Substance Use Topics  . Smoking status: Former Smoker    Packs/day: 1.00    Years: 40.00    Types: Cigarettes    Quit date: 10/21/1998  . Smokeless tobacco: Never Used     Comment: Past smoker, states he will smoke maybe 1 cigarette/ month or so still  . Alcohol use 0.0 oz/week     Comment: Occasional beer on the weekends    Family History Family History  Problem Relation Age of Onset  . Heart failure Father     CHF  . Colon cancer Neg Hx   . Prostate cancer Neg Hx   No family history of bleeding/clotting disorders, porphyria or autoimmune disease   Allergies  Allergen Reactions  . Augmentin [Amoxicillin-Pot Clavulanate] Other (See Comments)    Nausea,vomiting,diarrhea  . Metformin And Related Other (See Comments)    Intolerant of 2065m a day.   . Morphine And Related Nausea And Vomiting    Vomiting, GI upset     REVIEW OF SYSTEMS (Negative unless checked)  Constitutional: _0 Weight loss  _1 Fever  _2 Chills Cardiac:  _3 Chest pain   _4 Chest pressure   _5 Palpitations   _6 Shortness of breath when laying flat   _7 Shortness of breath with exertion. Vascular:  _8 Pain in legs with walking   _9 Pain in legs at rest  _10 History of DVT   _11 Phlebitis   _12 Swelling in legs   _13 Varicose veins   _14 Non-healing ulcers Pulmonary:   _15 Uses home oxygen   _16 Productive cough   _17 Hemoptysis   _18 Wheeze  _19 COPD   _20 Asthma Neurologic:  _21 Dizziness   _22 Seizures   _23 History of stroke   _24 History of TIA  _25 Aphasia   _26 Vissual changes   _27 Weakness or numbness in arm   _28 Weakness or numbness in leg Musculoskeletal:   _29 Joint swelling   _30 Joint pain   _31 Low back pain Hematologic:  _32 Easy bruising  _33   Easy bleeding   _0 Hypercoagulable state   _1 Anemic Gastrointestinal:  _2 Diarrhea   _3 Vomiting  _4 Gastroesophageal reflux/heartburn   _5 Difficulty swallowing. Genitourinary:  _6 Chronic kidney disease   _7 Difficult urination  _8 Frequent urination   _9 Blood in urine Skin:  _10 Rashes   _11 Ulcers  Psychological:  _12 History of anxiety   _13  History of major depression.  Physical Examination  Vitals:   07/07/16 0910  BP: 117/71  Pulse: (!) 59  Resp: 17  Weight: 91.6 kg (202 lb)  Height: _14  (1.727 m)   Body mass index is 30.71 kg/m. Gen: WD/WN, NAD Head: [place]/AT, No temporalis wasting.  Ear/Nose/Throat: Hearing grossly intact, nares w/o erythema or drainage, poor dentition Eyes: PER, EOMI, sclera nonicteric.  Neck: Supple, no masses.  No bruit or JVD.  Pulmonary:  Good air movement, clear to auscultation bilaterally, no use of accessory muscles.  Cardiac: RRR, normal S1, S2, no Murmurs. Vascular: Fasciotomies incisional scars remain intact, brisk capillary refill bilaterally both feet, skin is intact there are no open wounds or sores Vessel Right Left  Radial Palpable Palpable  Ulnar Palpable Palpable  Brachial Palpable Palpable  Carotid Palpable Palpable  Femoral Palpable Palpable  Popliteal Palpable Enlarged  Palpable  PT Palpable  Palpable  DP Palpable Palpable   Gastrointestinal: soft, non-distended. No guarding/no peritoneal signs.  Musculoskeletal: M/S 5/5 throughout.  No deformity or atrophy.  Neurologic: CN 2-12 intact. Pain and light touch intact in extremities.  Symmetrical.  Speech is fluent. Motor exam as listed above. Psychiatric: Judgment intact, Mood & affect appropriate for pt's clinical situation. Dermatologic: No rashes or ulcers noted.  No changes consistent with cellulitis. Lymph : No Cervical lymphadenopathy, no lichenification or skin changes of chronic lymphedema.  CBC Lab Results  Component Value Date   WBC 8.7 10/28/2014   HGB 14.4 10/28/2014   HCT 43.4 10/28/2014   MCV 93 10/28/2014   PLT 187 10/28/2014    BMET    Component Value Date/Time   NA 139 11/20/2015 1425   NA 134 (L) 10/29/2014 0908   K 4.4 11/20/2015 1425   K 4.3 10/29/2014 0908   CL 102 11/20/2015 1425   CL 103 10/29/2014 0908   CO2 29 11/20/2015 1425   CO2 24 10/29/2014 0908   GLUCOSE 141 (H) 11/20/2015 1425   GLUCOSE 142 (H) 10/29/2014 0908   BUN 24 (H) 11/20/2015 1425   BUN 20 10/29/2014 0908   CREATININE 1.49 11/20/2015 1425   CREATININE 1.32 (H) 10/29/2014 0908   CREATININE 0.96 01/28/2011 0855   CALCIUM 9.9 11/20/2015 1425   CALCIUM 9.3 10/29/2014 0908   GFRNONAA 55 (L) 10/29/2014 0908   GFRAA >60 10/29/2014 0908   CrCl cannot be calculated (Patient's most recent lab result is older than the maximum 21 days allowed.).  COAG Lab Results  Component Value Date   INR 0.9 10/24/2014   INR 1.07 12/28/2011    Radiology No results found.  Assessment/Plan  1.  Popliteal artery aneurysm (442.3  I72.4)  Recommend:  The patient has evidence of atherosclerosis of the lower extremities with successful bypass of his left popliteal artery aneurysm. The patient does not voice lifestyle limiting changes at this point in time.  No invasive studies, angiography or surgery at this time The patient should  continue walking and begin a more formal exercise program. The patient should continue antiplatelet therapy and aggressive treatment of the lipid abnormalities  The patient should continue wearing graduated compression socks 10-15 mmHg strength to control the mild edema.  Patient should undergo noninvasive studies as ordered. The patient will follow up with me after the studies.  Aorta Duplex On an annual basis Lower Extremity Arterial Duplex Exam w/ ABI (LEARTC w./ABI) (LEAD2)  2.  AAA (abdominal aortic aneurysm) without rupture (441.4  I71.4) Impression: His AAA is small and will be evaluated yearly no surgery or intervention at this time  3.  Diabetes mellitus type 2, uncomplicated (575.05  X83.3) Continue hypoglycemic medications as already ordered, these medications have been reviewed and there are no changes at this time.  Hgb A1C to be monitored as already arranged by primary service  4.  Emphysema (492.8  J43.9) Continue pulmonary medications and aerosols as already ordered, these medications have been reviewed and there are no changes at this time.  5.  Hyperlipidemia (272.4  E78.5) Continue statin as ordered and reviewed, no changes at this time   Hortencia Pilar, MD  07/07/2016 9:18 AM

## 2016-07-13 DIAGNOSIS — I714 Abdominal aortic aneurysm, without rupture, unspecified: Secondary | ICD-10-CM | POA: Insufficient documentation

## 2016-07-13 DIAGNOSIS — I724 Aneurysm of artery of lower extremity: Secondary | ICD-10-CM | POA: Insufficient documentation

## 2016-08-18 DIAGNOSIS — E113393 Type 2 diabetes mellitus with moderate nonproliferative diabetic retinopathy without macular edema, bilateral: Secondary | ICD-10-CM | POA: Diagnosis not present

## 2016-09-03 ENCOUNTER — Observation Stay
Admission: EM | Admit: 2016-09-03 | Discharge: 2016-09-05 | Disposition: A | Discharge status: Home / Self Care | Payer: Medicare Other | Attending: Specialist | Admitting: Specialist

## 2016-09-03 ENCOUNTER — Other Ambulatory Visit: Payer: Self-pay

## 2016-09-03 ENCOUNTER — Encounter: Payer: Self-pay | Admitting: Emergency Medicine

## 2016-09-03 ENCOUNTER — Emergency Department: Payer: Medicare Other

## 2016-09-03 DIAGNOSIS — J449 Chronic obstructive pulmonary disease, unspecified: Secondary | ICD-10-CM | POA: Diagnosis not present

## 2016-09-03 DIAGNOSIS — N189 Chronic kidney disease, unspecified: Secondary | ICD-10-CM | POA: Diagnosis not present

## 2016-09-03 DIAGNOSIS — E785 Hyperlipidemia, unspecified: Secondary | ICD-10-CM | POA: Diagnosis not present

## 2016-09-03 DIAGNOSIS — E7800 Pure hypercholesterolemia, unspecified: Secondary | ICD-10-CM | POA: Insufficient documentation

## 2016-09-03 DIAGNOSIS — E1122 Type 2 diabetes mellitus with diabetic chronic kidney disease: Secondary | ICD-10-CM | POA: Insufficient documentation

## 2016-09-03 DIAGNOSIS — I25110 Atherosclerotic heart disease of native coronary artery with unstable angina pectoris: Principal | ICD-10-CM | POA: Insufficient documentation

## 2016-09-03 DIAGNOSIS — M545 Low back pain: Secondary | ICD-10-CM | POA: Diagnosis not present

## 2016-09-03 DIAGNOSIS — Z880 Allergy status to penicillin: Secondary | ICD-10-CM | POA: Diagnosis not present

## 2016-09-03 DIAGNOSIS — Z7984 Long term (current) use of oral hypoglycemic drugs: Secondary | ICD-10-CM | POA: Insufficient documentation

## 2016-09-03 DIAGNOSIS — M1610 Unilateral primary osteoarthritis, unspecified hip: Secondary | ICD-10-CM | POA: Diagnosis not present

## 2016-09-03 DIAGNOSIS — I10 Essential (primary) hypertension: Secondary | ICD-10-CM | POA: Diagnosis not present

## 2016-09-03 DIAGNOSIS — I714 Abdominal aortic aneurysm, without rupture: Secondary | ICD-10-CM | POA: Diagnosis not present

## 2016-09-03 DIAGNOSIS — I724 Aneurysm of artery of lower extremity: Secondary | ICD-10-CM | POA: Diagnosis not present

## 2016-09-03 DIAGNOSIS — Z955 Presence of coronary angioplasty implant and graft: Secondary | ICD-10-CM | POA: Diagnosis not present

## 2016-09-03 DIAGNOSIS — I200 Unstable angina: Secondary | ICD-10-CM | POA: Diagnosis present

## 2016-09-03 DIAGNOSIS — R0602 Shortness of breath: Secondary | ICD-10-CM | POA: Diagnosis not present

## 2016-09-03 DIAGNOSIS — I129 Hypertensive chronic kidney disease with stage 1 through stage 4 chronic kidney disease, or unspecified chronic kidney disease: Secondary | ICD-10-CM | POA: Diagnosis not present

## 2016-09-03 DIAGNOSIS — I208 Other forms of angina pectoris: Secondary | ICD-10-CM

## 2016-09-03 DIAGNOSIS — I252 Old myocardial infarction: Secondary | ICD-10-CM | POA: Diagnosis not present

## 2016-09-03 DIAGNOSIS — Z885 Allergy status to narcotic agent status: Secondary | ICD-10-CM | POA: Diagnosis not present

## 2016-09-03 DIAGNOSIS — Z7982 Long term (current) use of aspirin: Secondary | ICD-10-CM | POA: Insufficient documentation

## 2016-09-03 DIAGNOSIS — R079 Chest pain, unspecified: Secondary | ICD-10-CM | POA: Diagnosis not present

## 2016-09-03 DIAGNOSIS — E1151 Type 2 diabetes mellitus with diabetic peripheral angiopathy without gangrene: Secondary | ICD-10-CM | POA: Diagnosis not present

## 2016-09-03 DIAGNOSIS — M1710 Unilateral primary osteoarthritis, unspecified knee: Secondary | ICD-10-CM | POA: Insufficient documentation

## 2016-09-03 DIAGNOSIS — M549 Dorsalgia, unspecified: Secondary | ICD-10-CM

## 2016-09-03 DIAGNOSIS — Z87891 Personal history of nicotine dependence: Secondary | ICD-10-CM | POA: Diagnosis not present

## 2016-09-03 DIAGNOSIS — I2510 Atherosclerotic heart disease of native coronary artery without angina pectoris: Secondary | ICD-10-CM | POA: Diagnosis not present

## 2016-09-03 HISTORY — DX: Chronic kidney disease, unspecified: N18.9

## 2016-09-03 HISTORY — DX: Acute myocardial infarction, unspecified: I21.9

## 2016-09-03 LAB — BASIC METABOLIC PANEL
Anion gap: 13 (ref 5–15)
BUN: 23 mg/dL — AB (ref 6–20)
CALCIUM: 9.5 mg/dL (ref 8.9–10.3)
CO2: 21 mmol/L — ABNORMAL LOW (ref 22–32)
CREATININE: 1.61 mg/dL — AB (ref 0.61–1.24)
Chloride: 105 mmol/L (ref 101–111)
GFR, EST AFRICAN AMERICAN: 48 mL/min — AB (ref >60)
GFR, EST NON AFRICAN AMERICAN: 41 mL/min — AB (ref >60)
Glucose, Bld: 210 mg/dL — ABNORMAL HIGH (ref 65–99)
Potassium: 4.3 mmol/L (ref 3.5–5.1)
SODIUM: 139 mmol/L (ref 135–145)

## 2016-09-03 LAB — LIPID PANEL
CHOL/HDL RATIO: 5.9 ratio
CHOLESTEROL: 124 mg/dL (ref 0–200)
HDL: 21 mg/dL — AB (ref >40)
LDL Cholesterol: UNDETERMINED mg/dL (ref 0–99)
Triglycerides: 596 mg/dL — ABNORMAL HIGH (ref <150)
VLDL: UNDETERMINED mg/dL (ref 0–40)

## 2016-09-03 LAB — CBC
HCT: 46 % (ref 40.0–52.0)
Hemoglobin: 16 g/dL (ref 13.0–18.0)
MCH: 32.1 pg (ref 26.0–34.0)
MCHC: 34.8 g/dL (ref 32.0–36.0)
MCV: 92.2 fL (ref 80.0–100.0)
PLATELETS: 197 10*3/uL (ref 150–440)
RBC: 4.99 MIL/uL (ref 4.40–5.90)
RDW: 14.1 % (ref 11.5–14.5)
WBC: 9.5 10*3/uL (ref 3.8–10.6)

## 2016-09-03 LAB — TROPONIN I

## 2016-09-03 LAB — GLUCOSE, CAPILLARY
Glucose-Capillary: 137 mg/dL — ABNORMAL HIGH (ref 65–99)
Glucose-Capillary: 144 mg/dL — ABNORMAL HIGH (ref 65–99)
Glucose-Capillary: 236 mg/dL — ABNORMAL HIGH (ref 65–99)

## 2016-09-03 MED ORDER — INSULIN ASPART 100 UNIT/ML ~~LOC~~ SOLN
0.0000 [IU] | Freq: Every day | SUBCUTANEOUS | Status: DC
  Administered 2016-09-03: 2 [IU] via SUBCUTANEOUS
  Administered 2016-09-04: 3 [IU] via SUBCUTANEOUS
  Filled 2016-09-03: qty 2
  Filled 2016-09-03: qty 3

## 2016-09-03 MED ORDER — ASPIRIN 81 MG PO CHEW
324.0000 mg | CHEWABLE_TABLET | Freq: Once | ORAL | Status: DC
  Filled 2016-09-03: qty 4

## 2016-09-03 MED ORDER — TICAGRELOR 60 MG PO TABS
60.0000 mg | ORAL_TABLET | Freq: Two times a day (BID) | ORAL | Status: DC
  Administered 2016-09-03 – 2016-09-04 (×3): 60 mg via ORAL
  Filled 2016-09-03 (×5): qty 1

## 2016-09-03 MED ORDER — SODIUM CHLORIDE 0.9% FLUSH
3.0000 mL | Freq: Two times a day (BID) | INTRAVENOUS | Status: DC
  Administered 2016-09-03 – 2016-09-05 (×3): 3 mL via INTRAVENOUS

## 2016-09-03 MED ORDER — ENOXAPARIN SODIUM 40 MG/0.4ML ~~LOC~~ SOLN
40.0000 mg | SUBCUTANEOUS | Status: DC
  Administered 2016-09-04: 40 mg via SUBCUTANEOUS
  Filled 2016-09-03: qty 0.4

## 2016-09-03 MED ORDER — MOMETASONE FURO-FORMOTEROL FUM 200-5 MCG/ACT IN AERO
2.0000 | INHALATION_SPRAY | Freq: Two times a day (BID) | RESPIRATORY_TRACT | Status: DC
  Administered 2016-09-03 – 2016-09-05 (×4): 2 via RESPIRATORY_TRACT
  Filled 2016-09-03: qty 8.8

## 2016-09-03 MED ORDER — ATORVASTATIN CALCIUM 20 MG PO TABS
40.0000 mg | ORAL_TABLET | Freq: Every day | ORAL | Status: DC
  Administered 2016-09-04 – 2016-09-05 (×2): 40 mg via ORAL
  Filled 2016-09-03: qty 2

## 2016-09-03 MED ORDER — ONDANSETRON HCL 4 MG PO TABS: 4.0000 mg | ORAL_TABLET | Freq: Four times a day (QID) | ORAL | Status: DC | PRN

## 2016-09-03 MED ORDER — INSULIN ASPART 100 UNIT/ML ~~LOC~~ SOLN
0.0000 [IU] | Freq: Three times a day (TID) | SUBCUTANEOUS | Status: DC
  Administered 2016-09-03: 1 [IU] via SUBCUTANEOUS
  Administered 2016-09-04: 2 [IU] via SUBCUTANEOUS
  Administered 2016-09-04: 5 [IU] via SUBCUTANEOUS
  Administered 2016-09-04: 2 [IU] via SUBCUTANEOUS
  Filled 2016-09-03: qty 1
  Filled 2016-09-03 (×2): qty 2
  Filled 2016-09-03: qty 5

## 2016-09-03 MED ORDER — ONDANSETRON HCL 4 MG/2ML IJ SOLN: 4.0000 mg | Freq: Four times a day (QID) | INTRAMUSCULAR | Status: DC | PRN

## 2016-09-03 MED ORDER — NITROGLYCERIN 2 % TD OINT
1.0000 [in_us] | TOPICAL_OINTMENT | Freq: Once | TRANSDERMAL | Status: AC
  Administered 2016-09-03: 1 [in_us] via TOPICAL
  Filled 2016-09-03: qty 1

## 2016-09-03 MED ORDER — NITROGLYCERIN 0.4 MG SL SUBL: 0.4000 mg | SUBLINGUAL_TABLET | SUBLINGUAL | Status: DC | PRN

## 2016-09-03 MED ORDER — SODIUM CHLORIDE 0.9 % IV SOLN
INTRAVENOUS | Status: DC
  Administered 2016-09-03: 19:00:00 via INTRAVENOUS

## 2016-09-03 MED ORDER — METOPROLOL TARTRATE 25 MG PO TABS
12.5000 mg | ORAL_TABLET | Freq: Two times a day (BID) | ORAL | Status: DC
  Administered 2016-09-03 – 2016-09-05 (×4): 12.5 mg via ORAL
  Filled 2016-09-03 (×3): qty 1

## 2016-09-03 MED ORDER — FENOFIBRATE 160 MG PO TABS
160.0000 mg | ORAL_TABLET | Freq: Every day | ORAL | Status: DC
  Administered 2016-09-04 – 2016-09-05 (×2): 160 mg via ORAL
  Filled 2016-09-03: qty 1

## 2016-09-03 MED ORDER — TRAMADOL HCL 50 MG PO TABS: 50.0000 mg | ORAL_TABLET | Freq: Four times a day (QID) | ORAL | Status: DC | PRN

## 2016-09-03 MED ORDER — METFORMIN HCL 500 MG PO TABS: 1000.0000 mg | ORAL_TABLET | Freq: Two times a day (BID) | ORAL | Status: DC

## 2016-09-03 MED ORDER — ACETAMINOPHEN 325 MG PO TABS
650.0000 mg | ORAL_TABLET | Freq: Four times a day (QID) | ORAL | Status: DC | PRN
  Administered 2016-09-04 (×2): 650 mg via ORAL
  Filled 2016-09-03 (×2): qty 2

## 2016-09-03 MED ORDER — ACETAMINOPHEN 650 MG RE SUPP: 650.0000 mg | Freq: Four times a day (QID) | RECTAL | Status: DC | PRN

## 2016-09-03 MED ORDER — ASPIRIN 81 MG PO CHEW
243.0000 mg | CHEWABLE_TABLET | Freq: Once | ORAL | Status: AC
  Administered 2016-09-03: 243 mg via ORAL

## 2016-09-03 MED ORDER — ASPIRIN EC 81 MG PO TBEC
81.0000 mg | DELAYED_RELEASE_TABLET | Freq: Every day | ORAL | Status: DC
  Administered 2016-09-04 – 2016-09-05 (×2): 81 mg via ORAL
  Filled 2016-09-03: qty 1

## 2016-09-03 MED ORDER — ENOXAPARIN SODIUM 100 MG/ML ~~LOC~~ SOLN
1.0000 mg/kg | Freq: Once | SUBCUTANEOUS | Status: AC
  Administered 2016-09-03: 90 mg via SUBCUTANEOUS
  Filled 2016-09-03: qty 1

## 2016-09-03 MED ORDER — GLUCOSAMINE-CHONDROITIN 500-400 MG PO TABS: 1.0000 | ORAL_TABLET | Freq: Two times a day (BID) | ORAL | Status: DC

## 2016-09-03 NOTE — ED Triage Notes (Signed)
Pt c/o pain between shoulder blades. This is how previous MI's have presented. Hx multiple stents.  [facility] NAD at this time. Skin mildly clammy but has been working outside per pt.

## 2016-09-03 NOTE — H&P (Signed)
[facility] [place] NAME: [name]    MR#:  [id number]  DATE OF BIRTH:  [birth year]  DATE OF ADMISSION:  09/03/2016  PRIMARY CARE PHYSICIAN: Elsie Stain, MD   REQUESTING/REFERRING PHYSICIAN: Dr. Lenise Arena  CHIEF COMPLAINT:   Chief Complaint  Patient presents with  . Chest Pain    HISTORY OF PRESENT ILLNESS:  [name]  is a 71 y.o. male with a known history of CAD status post LAD and RCA stents, hypertension, diabetes mellitus, peripheral vascular disease, arthritis, COPD and CKD presents from home secondary to angina equivalent pain between his shoulder blades. Patient's last stent was in April 2016, since then he has been stable. No recent stress test done. Today he was working outside the house and suddenly had pain between his shoulder legs posteriorly which is how he presented for all his prior heart attacks. Was also associated with diaphoresis and nausea and so presented to the emergency room. With nitroglycerin sublingually, his pain is resolved. EKG does not show any acute findings and his first troponin level is negative. But with his angina like symptoms he is being admitted for further workup. Denies any recent travel, fevers or chills, no abdominal pain or diarrhea or other symptoms.  PAST MEDICAL HISTORY:   Past Medical History:  Diagnosis Date  . CAD (coronary artery disease)    a. 1999: PCI-->LAD 2/2 MI; b. 2005: inf MI s/p PCI/DES x 4 to RCA; c. Myoview in 12/2005: EF 61%, no evidence for ischemia; d. cath 10/2014: occluded mLAD w/ L to L and L to R collats, dRCA 95% s/p PCI/DES 0%, mPDA 70%, LCx mild to mod irregs, procedure complicated by inf ST ele in recovery, repeat cath showed acute dRCA stent thrombosis o/w occluded mRPDA, PTCA dRCA, PCI/DES RPDA, aggrastat x 18 hr  . CKD (chronic kidney disease)   . COPD (chronic obstructive pulmonary disease) ([place])   . DM2 (diabetes mellitus, type 2) ([place])   . H/O  hiatal hernia   . Heart attack   . HTN (hypertension)   . Hypercholesterolemia   . Impaired fasting glucose    Elevated after steroid injection  . Osteoarthritis of hip   . Osteoarthritis, knee   . PAD (peripheral artery disease) ([facility])    L fem to below the knee popliteal vein bypass graft  . Peri-rectal abscess 12/27/2011  . Tobacco abuse    Prior    PAST SURGICAL HISTORY:   Past Surgical History:  Procedure Laterality Date  . CORONARY ANGIOPLASTY    . [place], 2006   Multiple, LAD in 1999, RCA in 2005  . FEMORAL-POPLITEAL BYPASS GRAFT  2016   L fem to below the knee popliteal vein bypass graft  . INCISE AND DRAIN ABCESS  2007   on scrotum  . INCISION AND DRAINAGE PERIRECTAL ABSCESS  12/28/2011   Procedure: IRRIGATION AND DEBRIDEMENT PERIRECTAL ABSCESS;  Surgeon: Merrie Roof, MD;  Location: [facility];  Service: General;  Laterality: N/A;    SOCIAL HISTORY:   Social History  Substance Use Topics  . Smoking status: Former Smoker    Packs/day: 1.00    Years: 40.00    Types: Cigarettes    Quit date: 10/21/1998  . Smokeless tobacco: Never Used     Comment: Past smoker, states he will smoke maybe 1 cigarette/ month or so still  . Alcohol use 0.0 oz/week     Comment: Occasional beer  on the weekends    FAMILY HISTORY:   Family History  Problem Relation Age of Onset  . Heart failure Father     CHF  . CAD Mother   . Colon cancer Neg Hx   . Prostate cancer Neg Hx     DRUG ALLERGIES:   Allergies  Allergen Reactions  . Augmentin [Amoxicillin-Pot Clavulanate] Other (See Comments)    Nausea,vomiting,diarrhea  . Morphine And Related Nausea And Vomiting    Vomiting, GI upset    REVIEW OF SYSTEMS:   Review of Systems  Constitutional: Negative for chills, fever, malaise/fatigue and weight loss.  HENT: Negative for ear discharge, ear pain, hearing loss, nosebleeds and tinnitus.   Eyes: Negative for blurred vision, double vision and photophobia.    Respiratory: Negative for cough, hemoptysis, shortness of breath and wheezing.   Cardiovascular: Negative for chest pain, palpitations, orthopnea and leg swelling.  Gastrointestinal: Positive for nausea. Negative for abdominal pain, constipation, diarrhea, heartburn, melena and vomiting.  Genitourinary: Negative for dysuria, frequency, hematuria and urgency.  Musculoskeletal: Positive for back pain. Negative for myalgias and neck pain.  Skin: Negative for rash.  Neurological: Negative for dizziness, tingling, tremors, sensory change, speech change, focal weakness and headaches.  Endo/Heme/Allergies: Does not bruise/bleed easily.  Psychiatric/Behavioral: Negative for depression.    MEDICATIONS AT HOME:   Prior to Admission medications   Medication Sig Start Date End Date Taking? Authorizing Provider  albuterol (PROAIR HFA) 108 (90 BASE) MCG/ACT inhaler Inhale 2 puffs into the lungs 3 (three) times daily as needed (cough). 04/17/13  Yes Tonia Ghent, MD  aspirin 81 MG EC tablet Take 1 tablet (81 mg total) by mouth daily. 11/19/14  Yes Minna Merritts, MD  atorvastatin (LIPITOR) 40 MG tablet TAKE ONE TABLET BY MOUTH ONCE DAILY 09/24/15  Yes Minna Merritts, MD  benzonatate (TESSALON) 200 MG capsule TAKE ONE CAPSULE BY MOUTH THREE TIMES DAILY AS NEEDED 05/14/15  Yes Tonia Ghent, MD  fenofibrate (TRICOR) 145 MG tablet Take 1 tablet (145 mg total) by mouth daily. 05/25/16  Yes Minna Merritts, MD  glucosamine-chondroitin 500-400 MG tablet Take 1 tablet by mouth 2 (two) times daily.   Yes Historical Provider, MD  hydrochlorothiazide (HYDRODIURIL) 25 MG tablet TAKE ONE TABLET BY MOUTH ONCE DAILY 04/11/16  Yes Minna Merritts, MD  lisinopril (PRINIVIL,ZESTRIL) 40 MG tablet TAKE ONE TABLET BY MOUTH ONCE DAILY 06/21/16  Yes Minna Merritts, MD  metFORMIN (GLUCOPHAGE) 500 MG tablet TAKE TWO TABLETS BY MOUTH TWICE DAILY WITH  A  MEAL 06/13/16  Yes Tonia Ghent, MD  metoprolol tartrate  (LOPRESSOR) 25 MG tablet TAKE ONE-HALF TABLET BY MOUTH TWICE DAILY 11/16/15  Yes Minna Merritts, MD  Multiple Vitamins-Minerals (PRESERVISION AREDS 2 PO) Take 1 tablet by mouth 2 (two) times daily.   Yes Historical Provider, MD  nitroGLYCERIN (NITROSTAT) 0.4 MG SL tablet Place 1 tablet (0.4 mg total) under the tongue every 5 (five) minutes as needed. For chest pain 12/01/15  Yes Tonia Ghent, MD  SYMBICORT 160-4.5 MCG/ACT inhaler INHALE TWO PUFFS BY MOUTH IN THE MORNING 10/26/15  Yes Tonia Ghent, MD  ticagrelor (BRILINTA) 60 MG TABS tablet Take 1 tablet (60 mg total) by mouth 2 (two) times daily. 06/10/16  Yes Minna Merritts, MD  traMADol (ULTRAM) 50 MG tablet Take 1 tablet (50 mg total) by mouth every 6 (six) hours as needed. 11/19/14  Yes Minna Merritts, MD  Blood Glucose Monitoring Suppl (RELION  CONFIRM GLUCOSE MONITOR) W/DEVICE KIT Check blood sugar once daily as needed.  Diagnosis:  250.00  Non-insulin dependent 06/20/13   Tonia Ghent, MD  glucose blood (RELION CONFIRM/MICRO TEST) test strip Use as instructed to test blood sugar once daily as needed.  Diagnosis:  250.00  Non insulin-dependent. 06/20/13   Tonia Ghent, MD  meclizine (ANTIVERT) 25 MG tablet Take 25 mg by mouth every 6 (six) hours as needed. For dizziness/vertigo    Historical Provider, MD  RELION LANCETS MICRO-THIN 33G MISC Use to test blood sugar once daily as needed.  Diagnosis:  250.00   Non insulin dependent. 06/20/13   Tonia Ghent, MD      VITAL SIGNS:  Blood pressure 111/80, pulse 73, temperature 98 F (36.7 C), temperature source Oral, resp. rate 17, height 5' 8"  (1.727 m), weight 90.7 kg (200 lb), SpO2 94 %.  PHYSICAL EXAMINATION:   Physical Exam  GENERAL:  71 y.o.-year-old patient lying in the bed with no acute distress.  EYES: Pupils equal, round, reactive to light and accommodation. No scleral icterus. Extraocular muscles intact.  HEENT: Head atraumatic, normocephalic. Oropharynx and nasopharynx  clear.  NECK:  Supple, no jugular venous distention. No thyroid enlargement, no tenderness.  LUNGS: Normal breath sounds bilaterally, scattered expiratory wheezing posteriorly, no rales,rhonchi or crepitation. No use of accessory muscles of respiration.  CARDIOVASCULAR: S1, S2 normal. No murmurs, rubs, or gallops.  ABDOMEN: Soft, nontender, nondistended. Bowel sounds present. No organomegaly or mass.  EXTREMITIES: No pedal edema, cyanosis, or clubbing.  NEUROLOGIC: Cranial nerves II through XII are intact. Muscle strength 5/5 in all extremities. Sensation intact. Gait not checked.  PSYCHIATRIC: The patient is alert and oriented x 3.  SKIN: No obvious rash, lesion, or ulcer.   LABORATORY PANEL:   CBC  Recent Labs Lab 09/03/16 1426  WBC 9.5  HGB 16.0  HCT 46.0  PLT 197   ------------------------------------------------------------------------------------------------------------------  Chemistries   Recent Labs Lab 09/03/16 1426  NA 139  K 4.3  CL 105  CO2 21*  GLUCOSE 210*  BUN 23*  CREATININE 1.61*  CALCIUM 9.5   ------------------------------------------------------------------------------------------------------------------  Cardiac Enzymes  Recent Labs Lab 09/03/16 1426  TROPONINI <0.03   ------------------------------------------------------------------------------------------------------------------  RADIOLOGY:  Dg Chest 2 View  Result Date: 09/03/2016 CLINICAL DATA:  Chest pain and shortness of breath. EXAM: CHEST  2 VIEW COMPARISON:  01/20/2015 and prior radiographs FINDINGS: The cardiomediastinal silhouette is unremarkable. There is no evidence of focal airspace disease, pulmonary edema, suspicious pulmonary nodule/mass, pleural effusion, or pneumothorax. No acute bony abnormalities are identified. IMPRESSION: No active cardiopulmonary disease. Electronically Signed   By: Margarette Canada M.D.   On: 09/03/2016 15:02    EKG:   Orders placed or performed  during the hospital encounter of 09/03/16  . ED EKG within 10 minutes  . ED EKG within 10 minutes    IMPRESSION AND PLAN:   [name]  is a 71 y.o. male with a known history of CAD status post LAD and RCA stents, hypertension, diabetes mellitus, peripheral vascular disease, arthritis, COPD and CKD presents from home secondary to angina equivalent pain between his shoulder blades.  #1 Unstable angina- admit to telemetry - troponins x 3, cards consult - Myoview in AM - continue cardiac meds- asa, brilinta, metoprolol, statin  #2 HTN- cont metoprolol, hold lisinopril as BP is borderline  #3 DM- on metformin and SSI  #4 COPD_ stable, scattered wheezing- chronic, cont inhalers Not requiring o2 support  #5 DVT Prophylaxis-  lovenox    All the records are reviewed and case discussed with ED provider. Management plans discussed with the patient, family and they are in agreement.  CODE STATUS: Full Code  TOTAL TIME TAKING CARE OF THIS PATIENT: 55 minutes.    Gladstone Lighter M.D on 09/03/2016 at 5:21 PM  Between 7am to 6pm - Pager - [phone]  After 6pm go to [email or web address] - password [email or web address]  Office  [phone]  CC: Primary care physician; Elsie Stain, MD

## 2016-09-03 NOTE — ED Notes (Signed)
Half of nitro past removed from patient's chest per Dr. Mayford KnifeWilliams due to low blood pressure (92/72)

## 2016-09-03 NOTE — ED Provider Notes (Signed)
[facility] Emergency Department Provider Note        Time seen: ----------------------------------------- 3:23 PM on 09/03/2016 -----------------------------------------    I have reviewed the triage vital signs and the nursing notes.   HISTORY  Chief Complaint Chest Pain    HPI Brett MCCLENAHAN is a 71 y.o. male who presents to ER for pain between his shoulder blades. Patient states his prior MIs have presented. He has multiple stents and has had several heart attacks in the past. Patient states he was short of breath and was nauseous when the pain started. Patient states he was mildly diaphoretic but he had been working outside and it was very hot. Patient states symptoms have resolved at this time, he doesn't have any current chest or back pain.   Past Medical History:  Diagnosis Date  . CAD (coronary artery disease)    a. 1999: PCI-->LAD 2/2 MI; b. 2005: inf MI s/p PCI/DES x 4 to RCA; c. Myoview in 12/2005: EF 61%, no evidence for ischemia; d. cath 10/2014: occluded mLAD w/ L to L and L to R collats, dRCA 95% s/p PCI/DES 0%, mPDA 70%, LCx mild to mod irregs, procedure complicated by inf ST ele in recovery, repeat cath showed acute dRCA stent thrombosis o/w occluded mRPDA, PTCA dRCA, PCI/DES RPDA, aggrastat x 18 hr  . COPD (chronic obstructive pulmonary disease) (HCC)   . DM2 (diabetes mellitus, type 2) (HCC)   . H/O hiatal hernia   . Heart attack   . HTN (hypertension)   . Hypercholesterolemia   . Impaired fasting glucose    Elevated after steroid injection  . Osteoarthritis of hip   . Osteoarthritis, knee   . PAD (peripheral artery disease) (HCC)    L fem to below the knee popliteal vein bypass graft  . Peri-rectal abscess 12/27/2011  . Tobacco abuse    Prior    Patient Active Problem List   Diagnosis Date Noted  . AAA (abdominal aortic aneurysm) without rupture (HCC) 07/13/2016  . Popliteal artery aneurysm (HCC) 07/13/2016  . Carotid artery  disease (HCC) 01/30/2015  . NSTEMI (non-ST elevated myocardial infarction) (HCC) 11/16/2014  . Medicare annual wellness visit, initial 10/21/2014  . Advance care planning 10/21/2014  . PAD (peripheral artery disease) (HCC) 09/09/2014  . COPD exacerbation (HCC) 08/21/2014  . DM (diabetes mellitus), type 2 with peripheral vascular complications (HCC) 05/24/2013  . Back pain 02/24/2013  . Cough 01/26/2011  . COPD (chronic obstructive pulmonary disease) (HCC) 12/26/2010  . FATIGUE 05/11/2009  . Essential hypertension 05/08/2009  . Hyperlipidemia 06/20/2008  . CAD, NATIVE VESSEL 06/20/2008    Past Surgical History:  Procedure Laterality Date  . CORONARY ANGIOPLASTY    . CORONARY STENT PLACEMENT  1999, 2006   Multiple, LAD in 1999, RCA in 2005  . FEMORAL-POPLITEAL BYPASS GRAFT  2016   L fem to below the knee popliteal vein bypass graft  . INCISE AND DRAIN ABCESS  2007   on scrotum  . INCISION AND DRAINAGE PERIRECTAL ABSCESS  12/28/2011   Procedure: IRRIGATION AND DEBRIDEMENT PERIRECTAL ABSCESS;  Surgeon: Robyne Askew, MD;  Location: [facility] OR;  Service: General;  Laterality: N/A;    Allergies Augmentin [amoxicillin-pot clavulanate] and Morphine and related  Social History Social History  Substance Use Topics  . Smoking status: Former Smoker    Packs/day: 1.00    Years: 40.00    Types: Cigarettes    Quit date: 10/21/1998  . Smokeless tobacco: Never Used  Comment: Past smoker, states he will smoke maybe 1 cigarette/ month or so still  . Alcohol use 0.0 oz/week     Comment: Occasional beer on the weekends    Review of Systems Constitutional: Negative for fever. Cardiovascular: Negative for chest pain. Respiratory: Positive for recent shortness of breath Gastrointestinal: Negative for abdominal pain, vomiting and diarrhea. Genitourinary: Negative for dysuria. Musculoskeletal: Positive for back pain Skin: Negative for rash. Neurological: Negative for headaches, focal  weakness or numbness.  10-point ROS otherwise negative.  ____________________________________________   PHYSICAL EXAM:  VITAL SIGNS: ED Triage Vitals  Enc Vitals Group     BP 09/03/16 1424 104/64     Pulse Rate 09/03/16 1424 71     Resp 09/03/16 1424 18     Temp 09/03/16 1424 98 F (36.7 C)     Temp Source 09/03/16 1424 Oral     SpO2 09/03/16 1424 96 %     Weight 09/03/16 1422 200 lb (90.7 kg)     Height 09/03/16 1422 5\' 8"  (1.727 m)     Head Circumference --      Peak Flow --      Pain Score 09/03/16 1423 5     Pain Loc --      Pain Edu? --      Excl. in GC? --     Constitutional: Alert and oriented. Well appearing and in no distress. Eyes: Conjunctivae are normal. PERRL. Normal extraocular movements. ENT   Head: Normocephalic and atraumatic.   Nose: No congestion/rhinnorhea.   Mouth/Throat: Mucous membranes are moist.   Neck: No stridor. Cardiovascular: Normal rate, regular rhythm. No murmurs, rubs, or gallops. Respiratory: Normal respiratory effort without tachypnea nor retractions. Breath sounds are clear and equal bilaterally. No wheezes/rales/rhonchi. Gastrointestinal: Soft and nontender. Normal bowel sounds Musculoskeletal: Nontender with normal range of motion in all extremities. No lower extremity tenderness nor edema. Neurologic:  Normal speech and language. No gross focal neurologic deficits are appreciated.  Skin:  Skin is warm, dry and intact. No rash noted. Psychiatric: Mood and affect are normal. Speech and behavior are normal.  ____________________________________________  EKG: Interpreted by me. Sinus rhythm rate of 72 bpm, normal PR interval, normal QRS, normal QT, normal axis.  ____________________________________________  ED COURSE:  Pertinent labs & imaging results that were available during my care of the patient were reviewed by me and considered in my medical decision making (see chart for details). Patient presents to ER after  chest pain concerning for unstable angina with a prior history. We will assess with labs and imaging.   Procedures ____________________________________________   LABS (pertinent positives/negatives)  Labs Reviewed  BASIC METABOLIC PANEL - Abnormal; Notable for the following:       Result Value   CO2 21 (*)    Glucose, Bld 210 (*)    BUN 23 (*)    Creatinine, Ser 1.61 (*)    GFR calc non Af Amer 41 (*)    GFR calc Af Amer 48 (*)    All other components within normal limits  CBC  TROPONIN I    RADIOLOGY Images were viewed by me  Chest x-ray Is unremarkable ____________________________________________  FINAL ASSESSMENT AND PLAN  Back pain, anginal equivalent  Plan: Patient with labs and imaging as dictated above. Patient did have recurrence in his symptoms which were identical to prior MI. He is very high risk and will need to be thoroughly ruled out and possibly stressed to ensure he is safe to go home.  Family is agreeable to admission at this time.   Emily Filbert, MD   Note: This note was generated in part or whole with voice recognition software. Voice recognition is usually quite accurate but there are transcription errors that can and very often do occur. I apologize for any typographical errors that were not detected and corrected.     Emily Filbert, MD 09/03/16 [phone]

## 2016-09-04 DIAGNOSIS — I200 Unstable angina: Secondary | ICD-10-CM

## 2016-09-04 DIAGNOSIS — E785 Hyperlipidemia, unspecified: Secondary | ICD-10-CM | POA: Diagnosis not present

## 2016-09-04 DIAGNOSIS — I25110 Atherosclerotic heart disease of native coronary artery with unstable angina pectoris: Secondary | ICD-10-CM | POA: Diagnosis not present

## 2016-09-04 DIAGNOSIS — I10 Essential (primary) hypertension: Secondary | ICD-10-CM | POA: Diagnosis not present

## 2016-09-04 DIAGNOSIS — I2510 Atherosclerotic heart disease of native coronary artery without angina pectoris: Secondary | ICD-10-CM | POA: Diagnosis not present

## 2016-09-04 LAB — BASIC METABOLIC PANEL
Anion gap: 9 (ref 5–15)
BUN: 25 mg/dL — ABNORMAL HIGH (ref 6–20)
CALCIUM: 8.8 mg/dL — AB (ref 8.9–10.3)
CHLORIDE: 102 mmol/L (ref 101–111)
CO2: 25 mmol/L (ref 22–32)
CREATININE: 1.68 mg/dL — AB (ref 0.61–1.24)
GFR calc non Af Amer: 39 mL/min — ABNORMAL LOW (ref >60)
GFR, EST AFRICAN AMERICAN: 46 mL/min — AB (ref >60)
Glucose, Bld: 266 mg/dL — ABNORMAL HIGH (ref 65–99)
Potassium: 3.8 mmol/L (ref 3.5–5.1)
SODIUM: 136 mmol/L (ref 135–145)

## 2016-09-04 LAB — CBC
HCT: 41.6 % (ref 40.0–52.0)
HEMOGLOBIN: 14.6 g/dL (ref 13.0–18.0)
MCH: 32.2 pg (ref 26.0–34.0)
MCHC: 35.1 g/dL (ref 32.0–36.0)
MCV: 91.6 fL (ref 80.0–100.0)
PLATELETS: 161 10*3/uL (ref 150–440)
RBC: 4.54 MIL/uL (ref 4.40–5.90)
RDW: 14 % (ref 11.5–14.5)
WBC: 7.7 10*3/uL (ref 3.8–10.6)

## 2016-09-04 LAB — GLUCOSE, CAPILLARY
GLUCOSE-CAPILLARY: 191 mg/dL — AB (ref 65–99)
GLUCOSE-CAPILLARY: 272 mg/dL — AB (ref 65–99)
GLUCOSE-CAPILLARY: 270 mg/dL — AB (ref 65–99)
Glucose-Capillary: 186 mg/dL — ABNORMAL HIGH (ref 65–99)

## 2016-09-04 LAB — TROPONIN I
Troponin I: <0.03 ng/mL (ref <0.03)
Troponin I: <0.03 ng/mL (ref <0.03)

## 2016-09-04 NOTE — Progress Notes (Signed)
Consent for stress test  Signed and placed on chart. Education given to pt. On stress test.

## 2016-09-04 NOTE — Progress Notes (Signed)
[employer] [facility] [place] [facility]   PATIENT NAME: [name]    MR#:  [id number]  DATE OF BIRTH:  Apr 13, 1946  SUBJECTIVE:   Patient here due to chest pain. Currently chest pain-free and hemodynamically stable. Wife is at bedside. Cardiac markers 3 have been negative. Plan for nuclear medicine stress test in the morning.  REVIEW OF SYSTEMS:    Review of Systems  Constitutional: Negative for chills and fever.  HENT: Negative for congestion and tinnitus.   Eyes: Negative for blurred vision and double vision.  Respiratory: Negative for cough, shortness of breath and wheezing.   Cardiovascular: Negative for chest pain, orthopnea and PND.  Gastrointestinal: Negative for abdominal pain, diarrhea, nausea and vomiting.  Genitourinary: Negative for dysuria and hematuria.  Neurological: Negative for dizziness, sensory change and focal weakness.  All other systems reviewed and are negative.   Nutrition: Heart healthy Tolerating Diet: Yes Tolerating PT: Ambulatory  DRUG ALLERGIES:   Allergies  Allergen Reactions  . Augmentin [Amoxicillin-Pot Clavulanate] Other (See Comments)    Nausea,vomiting,diarrhea  . Morphine And Related Nausea And Vomiting    Vomiting, GI upset    VITALS:  Blood pressure 120/76, pulse (!) 55, temperature 97.9 F (36.6 C), temperature source Oral, resp. rate 16, height 5\' 8"  (1.727 m), weight 90.7 kg (200 lb), SpO2 97 %.  PHYSICAL EXAMINATION:   Physical Exam  GENERAL:  71 y.o.-year-old patient lying in bed in no acute distress.  EYES: Pupils equal, round, reactive to light and accommodation. No scleral icterus. Extraocular muscles intact.  HEENT: Head atraumatic, normocephalic. Oropharynx and nasopharynx clear.  NECK:  Supple, no jugular venous distention. No thyroid enlargement, no tenderness.  LUNGS: Normal breath sounds bilaterally, no wheezing, rales, rhonchi. No use of accessory muscles of respiration.  CARDIOVASCULAR: S1, S2  normal. No murmurs, rubs, or gallops.  ABDOMEN: Soft, nontender, nondistended. Bowel sounds present. No organomegaly or mass.  EXTREMITIES: No cyanosis, clubbing or edema b/l.    NEUROLOGIC: Cranial nerves II through XII are intact. No focal Motor or sensory deficits b/l.   PSYCHIATRIC: The patient is alert and oriented x 3.  SKIN: No obvious rash, lesion, or ulcer.    LABORATORY PANEL:   CBC  Recent Labs Lab 09/04/16 0209  WBC 7.7  HGB 14.6  HCT 41.6  PLT 161   ------------------------------------------------------------------------------------------------------------------  Chemistries   Recent Labs Lab 09/04/16 0209  NA 136  K 3.8  CL 102  CO2 25  GLUCOSE 266*  BUN 25*  CREATININE 1.68*  CALCIUM 8.8*   ------------------------------------------------------------------------------------------------------------------  Cardiac Enzymes  Recent Labs Lab 09/04/16 0825  TROPONINI <0.03   ------------------------------------------------------------------------------------------------------------------  RADIOLOGY:  Dg Chest 2 View  Result Date: 09/03/2016 CLINICAL DATA:  Chest pain and shortness of breath. EXAM: CHEST  2 VIEW COMPARISON:  01/20/2015 and prior radiographs FINDINGS: The cardiomediastinal silhouette is unremarkable. There is no evidence of focal airspace disease, pulmonary edema, suspicious pulmonary nodule/mass, pleural effusion, or pneumothorax. No acute bony abnormalities are identified. IMPRESSION: No active cardiopulmonary disease. Electronically Signed   By: Harmon Pier May.   On: 09/03/2016 15:02     ASSESSMENT AND PLAN:   [name]  is a 71 y.o. male with a known history of CAD status post LAD and RCA stents, hypertension, diabetes mellitus, peripheral vascular disease, arthritis, COPD and CKD presents from home secondary to angina equivalent pain between his shoulder blades.  #1 Unstable angina- patient's chest pain was very similar to  his previous heart attacks. -  Observed on telemetry, cardiac markers 3 have been negative. -Seen by cardiology and plan for nuclear medicine stress test in the morning. -Continue aspirin, Brilinta, metoprolol, statin.  #2 HTN- cont metoprolol, hold lisinopril as BP is borderline  #3 DM- on metformin and SSI - BS stable.   #4 COPD - no acute exacerbation.  - cont. Dulera  Possible d/c home tomorrow after stress test if it's normal.   All the records are reviewed and case discussed with Care Management/Social Worker. Management plans discussed with the patient, family and they are in agreement.  CODE STATUS: Full Code  DVT Prophylaxis: Lovenox  TOTAL TIME TAKING CARE OF THIS PATIENT: 25 minutes.   POSSIBLE D/C IN 1-2 DAYS, DEPENDING ON CLINICAL CONDITION.   [name] on 09/04/2016 at 1:56 PM  Between 7am to 6pm - Pager - [phone]  After 6pm go to [email or web address] - Social research officer, governmentpassword [email or web address]  [facility]  Office  [phone]  CC: Primary care physician; [name]

## 2016-09-04 NOTE — Consult Note (Signed)
Reason for Consult: CP  Referring Physician: Dr. Lauraine RinneSainani  [name] is an 71 y.o. male.   HPI:  Pt is a 71 yo with hx of CAD as noted below.  Also a history of HTN, DM, PVOD, COPD and CKD   Presented yesterday with CP  Actually manifested as subscapular pain. His symptoms are similar to prior episodes and he has ruled out for MI. He has known CAD, and is s/p multiple PCI's. Pain does not radiate. He did not try taking any slntg. No syncope. He now feels well. Has been under increased stress.  PMH: Past Medical History:  Diagnosis Date  . CAD (coronary artery disease)    a. 1999: PCI-->LAD 2/2 MI; b. 2005: inf MI s/p PCI/DES x 4 to RCA; c. Myoview in 12/2005: EF 61%, no evidence for ischemia; d. cath 10/2014: occluded mLAD w/ L to L and L to R collats, dRCA 95% s/p PCI/DES 0%, mPDA 70%, LCx mild to mod irregs, procedure complicated by inf ST ele in recovery, repeat cath showed acute dRCA stent thrombosis o/w occluded mRPDA, PTCA dRCA, PCI/DES RPDA, aggrastat x 18 hr  . CKD (chronic kidney disease)   . COPD (chronic obstructive pulmonary disease) (HCC)   . DM2 (diabetes mellitus, type 2) (HCC)   . H/O hiatal hernia   . Heart attack   . HTN (hypertension)   . Hypercholesterolemia   . Impaired fasting glucose    Elevated after steroid injection  . Osteoarthritis of hip   . Osteoarthritis, knee   . PAD (peripheral artery disease) (HCC)    L fem to below the knee popliteal vein bypass graft  . Peri-rectal abscess 12/27/2011  . Tobacco abuse    Prior    PSHX: Past Surgical History:  Procedure Laterality Date  . CORONARY ANGIOPLASTY    . CORONARY STENT PLACEMENT  1999, 2006   Multiple, LAD in 1999, RCA in 2005  . FEMORAL-POPLITEAL BYPASS GRAFT  2016   L fem to below the knee popliteal vein bypass graft  . INCISE AND DRAIN ABCESS  2007   on scrotum  . INCISION AND DRAINAGE PERIRECTAL ABSCESS  12/28/2011   Procedure: IRRIGATION AND DEBRIDEMENT PERIRECTAL ABSCESS;  Surgeon: Robyne AskewPaul S  Toth III, MD;  [facility];  Service: General;  Laterality: N/A;    FAMHX: Family History  Problem Relation Age of Onset  . Heart failure Father     CHF  . CAD Mother   . Colon cancer Neg Hx   . Prostate cancer Neg Hx     Social History:  reports that he quit smoking about 17 years ago. His smoking use included Cigarettes. He has a 40.00 pack-year smoking history. He has never used smokeless tobacco. He reports that he drinks alcohol. He reports that he does not use drugs.  Allergies:  Allergies  Allergen Reactions  . Augmentin [Amoxicillin-Pot Clavulanate] Other (See Comments)    Nausea,vomiting,diarrhea  . Morphine And Related Nausea And Vomiting    Vomiting, GI upset    Medications: I have reviewed the patient's current medications.  Dg Chest 2 View  Result Date: 09/03/2016 CLINICAL DATA:  Chest pain and shortness of breath. EXAM: CHEST  2 VIEW COMPARISON:  01/20/2015 and prior radiographs FINDINGS: The cardiomediastinal silhouette is unremarkable. There is no evidence of focal airspace disease, pulmonary edema, suspicious pulmonary nodule/mass, pleural effusion, or pneumothorax. No acute bony abnormalities are identified. IMPRESSION: No active cardiopulmonary disease. Electronically Signed   By: Harmon PierJeffrey  Hu  M.D.   On: 09/03/2016 15:02    ROS  As stated in the HPI and negative for all other systems.  Physical Exam  Vitals:Blood pressure 115/73, pulse (!) 54, temperature 97.8 F (36.6 C), temperature source Oral, resp. rate 16, height 5\' 8"  (1.727 m), weight 200 lb (90.7 kg), SpO2 95 %.  Well appearing 71 yo man, NAD HEENT: Unremarkable Neck:  6 cm JVD, no thyromegally Lymphatics:  No adenopathy Back:  No CVA tenderness Lungs:  Clear with no wheezes HEART:  Regular rate rhythm, no murmurs, no rubs, no clicks Abd:  Flat, positive bowel sounds, no organomegally, no rebound, no guarding Ext:  2 plus pulses, no edema, no cyanosis, no clubbing Skin:  No rashes no  nodules Neuro:  CN II through XII intact, motor grossly intact  Assessment/Plan: 1. Angina - the patient notes that his mid scapular pain is just like prior anginal symptoms. He is pending a stress test tomorrow.   2  HTN - His blood pressure is well controlled. No change in meds.   3  Dyslipidemia - he will continue his atorvastatin  Kristopher Glee 09/04/2016, 9:13 AM

## 2016-09-04 NOTE — Care Management Obs Status (Signed)
MEDICARE OBSERVATION STATUS NOTIFICATION   Patient Details  Name: Brett PostinVance K Critcher MRN: [id number] Date of Birth: 02/12/1946   Medicare Observation Status Notification Given:  Yes    Lorin Gawron A, RN 09/04/2016, 3:47 PM

## 2016-09-05 ENCOUNTER — Encounter: Payer: Self-pay | Admitting: Radiology

## 2016-09-05 ENCOUNTER — Observation Stay (HOSPITAL_BASED_OUTPATIENT_CLINIC_OR_DEPARTMENT_OTHER): Payer: Medicare Other

## 2016-09-05 DIAGNOSIS — I208 Other forms of angina pectoris: Secondary | ICD-10-CM | POA: Diagnosis not present

## 2016-09-05 DIAGNOSIS — I25110 Atherosclerotic heart disease of native coronary artery with unstable angina pectoris: Secondary | ICD-10-CM | POA: Diagnosis not present

## 2016-09-05 DIAGNOSIS — E785 Hyperlipidemia, unspecified: Secondary | ICD-10-CM | POA: Diagnosis not present

## 2016-09-05 DIAGNOSIS — I200 Unstable angina: Secondary | ICD-10-CM | POA: Diagnosis not present

## 2016-09-05 DIAGNOSIS — I2510 Atherosclerotic heart disease of native coronary artery without angina pectoris: Secondary | ICD-10-CM | POA: Diagnosis not present

## 2016-09-05 DIAGNOSIS — I10 Essential (primary) hypertension: Secondary | ICD-10-CM | POA: Diagnosis not present

## 2016-09-05 LAB — NM MYOCAR MULTI W/SPECT W/WALL MOTION / EF
CHL CUP NUCLEAR SDS: 3
CSEPHR: 55 %
CSEPPHR: 82 {beats}/min
LV dias vol: 48 mL (ref 62–150)
LV sys vol: 13 mL
Rest HR: 57 {beats}/min
SRS: 2
SSS: 5
TID: 1.08

## 2016-09-05 LAB — HEMOGLOBIN A1C
HEMOGLOBIN A1C: 10.5 % — AB (ref 4.8–5.6)
Mean Plasma Glucose: 255 mg/dL

## 2016-09-05 LAB — GLUCOSE, CAPILLARY
Glucose-Capillary: 201 mg/dL — ABNORMAL HIGH (ref 65–99)
Glucose-Capillary: 227 mg/dL — ABNORMAL HIGH (ref 65–99)

## 2016-09-05 MED ORDER — TECHNETIUM TC 99M TETROFOSMIN IV KIT
13.7870 | PACK | Freq: Once | INTRAVENOUS | Status: AC | PRN
  Administered 2016-09-05: 13.787 via INTRAVENOUS

## 2016-09-05 MED ORDER — GLIPIZIDE 5 MG PO TABS: 5.0000 mg | ORAL_TABLET | Freq: Every day | ORAL | Status: DC

## 2016-09-05 MED ORDER — REGADENOSON 0.4 MG/5ML IV SOLN
0.4000 mg | Freq: Once | INTRAVENOUS | Status: AC
  Administered 2016-09-05: 0.4 mg via INTRAVENOUS
  Filled 2016-09-05: qty 5

## 2016-09-05 MED ORDER — TECHNETIUM TC 99M TETROFOSMIN IV KIT
32.8110 | PACK | Freq: Once | INTRAVENOUS | Status: AC | PRN
  Administered 2016-09-05: 32.811 via INTRAVENOUS

## 2016-09-05 MED ORDER — LIVING WELL WITH DIABETES BOOK
Freq: Once | Status: AC
  Administered 2016-09-05: 16:00:00
  Filled 2016-09-05: qty 1

## 2016-09-05 MED ORDER — GLIPIZIDE 5 MG PO TABS: 5.0000 mg | ORAL_TABLET | Freq: Every day | ORAL | 1 refills | Status: DC

## 2016-09-05 NOTE — Progress Notes (Signed)
Pt to be discharged after negative stress test. Iv and tele removed. disch instructions and prescrip given/ disch via w.c. Accompanied by wife.

## 2016-09-05 NOTE — Consult Note (Addendum)
   [facility] CM [facility]patient Consult   09/05/2016  Ermalene PostinVance K Rooks 09/10/1945 [id number]   Referral received from DM Coordinator regarding [facility]. [facility] to discuss [facility] and [name] states this is not a good time to discuss at this time. States it has been a very long morning and he is not up to talking. Made inpatient RNCM [facility] aware.    Raiford NobleAtika Hall, MSN-Ed, RN,BSN [facility] Care Management Hospital Liaison [phone]

## 2016-09-05 NOTE — Progress Notes (Addendum)
Inpatient Diabetes Program Recommendations  AACE/ADA: New Consensus Statement on Inpatient Glycemic Control (2015)  Target Ranges:  Prepandial:   less than 140 mg/dL      Peak postprandial:   less than 180 mg/dL (1-2 hours)      Critically ill patients:  140 - 180 mg/dL  Results for [name], [name] (MRN [id number]) as of 09/05/2016 07:50  Ref. Range 09/04/2016 07:48 09/04/2016 11:48 09/04/2016 16:45 09/04/2016 21:10 09/05/2016 07:39  Glucose-Capillary Latest Ref Range: 65 - 99 mg/dL 846191 (H) 962186 (H) 952272 (H) 270 (H) 201 (H)   Results for [name], [name] (MRN [id number]) as of 09/05/2016 07:50  Ref. Range 11/20/2015 14:25 09/03/2016 17:36  Hemoglobin A1C Latest Ref Range: 4.8 - 5.6 % 9.1 (H) 10.5 (H)   Review of Glycemic Control  Diabetes history: DM2 Outpatient Diabetes medications: Metformin 1000 mg BID Current orders for Inpatient glycemic control: Novolog 0-9 units TID with meals, Novolog 0-5 nits QHS, Metformin 1000 mg BID  Inpatient Diabetes Program Recommendations: Insulin - Basal: While inpatient, please consider ordering Lantus 9 units Q24H. HgbA1C: A1C 10.5% on 09/03/16 indicating an average glucose of 255 mg/dl over the past 2-3 months. Recommend patient follow up with PCP and at time of discharge, MD may want to order additional DM medication(s).  Addendum 09/05/16@12 :43-Spoke with patient and his wife about diabetes and home regimen for diabetes control. Patient reports that he is followed by PCP for diabetes management and currently he takes Metformin 1000 mg BID as an outpatient for diabetes control. Patient reports that he is taking DM medication as prescribed.  Patient states that he checks his glucose randomly but when he does check it, it ranges from 150-mid 200's mg/dl.  Discussed A1C results (10.5%% on 09/03/16) and explained that his current A1C indicates an average glucose of 255 mg/dl over the past 2-3 months. Discussed glucose and A1C goals. Discussed importance of checking CBGs  and maintaining good CBG control to prevent long-term and short-term complications. Explained how hyperglycemia leads to damage within blood vessels which lead to the common complications seen with uncontrolled diabetes. Stressed to the patient the importance of improving glycemic control to prevent further complications from uncontrolled diabetes. Discussed impact of nutrition, exercise, stress, sickness, and medications on diabetes control. Patient states that he tries to follow a carb modified diet and does not understand why his DM control has gotten worse.  Discussed how DM is a progressive disease and explained that he likely needs an additional DM medication for DM control.   Encouraged patient to check his glucose at least 2 times per day and to keep a log book of glucose readings which he will need to take to doctor appointments. Explained how his doctor can use the log book to continue to make adjustments with DM medications if needed. Patient is willing to take additional oral DM medication if needed. Patient verbalized understanding of information discussed and he states that he has no further questions at this time related to diabetes.  Thanks, Orlando PennerMarie Marysue Fait, RN, MSN, CDE Diabetes Coordinator Inpatient Diabetes Program [phone] (Team Pager from 8am to 5pm)

## 2016-09-05 NOTE — Progress Notes (Signed)
Patient Name: Ermalene PostinVance K Castrogiovanni Date of Encounter: 09/05/2016  [facility]  Hospital Problem List     Active Problems:   Unstable angina [facility](HCC)     Subjective   No further chest pain since being in the ED. Has ruled out. He is for nuclear stress test this morning. BP well controlled.   Inpatient Medications    Scheduled Meds: . aspirin EC  81 mg Oral Daily  . atorvastatin  40 mg Oral Daily  . enoxaparin (LOVENOX) injection  40 mg Subcutaneous Q24H  . fenofibrate  160 mg Oral Daily  . insulin aspart  0-5 Units Subcutaneous QHS  . insulin aspart  0-9 Units Subcutaneous TID WC  . living well with diabetes book   Does not apply Once  . metFORMIN  1,000 mg Oral BID WC  . metoprolol tartrate  12.5 mg Oral BID  . mometasone-formoterol  2 puff Inhalation BID  . sodium chloride flush  3 mL Intravenous Q12H  . ticagrelor  60 mg Oral BID   Continuous Infusions:  PRN Meds: acetaminophen **OR** acetaminophen, nitroGLYCERIN, ondansetron **OR** ondansetron (ZOFRAN) IV, traMADol   Vital Signs    Vitals:   09/04/16 0800 09/04/16 1150 09/04/16 2034 09/05/16 0407  BP: 115/73 120/76 114/69 106/69  Pulse: (!) 54 (!) 55 63 (!) 56  Resp: 16 16 18 18   Temp: 97.8 F (36.6 C) 97.9 F (36.6 C) 98 F (36.7 C) 98.2 F (36.8 C)  TempSrc: Oral Oral Oral Oral  SpO2: 95% 97% 95% 96%  Weight:      Height:        Intake/Output Summary (Last 24 hours) at 09/05/16 0801 Last data filed at 09/05/16 0435  Gross per 24 hour  Intake              480 ml  Output             1500 ml  Net            -1020 ml   Filed Weights   09/03/16 1422  Weight: 200 lb (90.7 kg)    Physical Exam    GEN: Well nourished, well developed, in no acute distress.  HEENT: Grossly normal.  Neck: Supple, no JVD, carotid bruits, or masses. Cardiac: RRR, no murmurs, rubs, or gallops. No clubbing, cyanosis, edema.  Radials/DP/PT 2+ and equal bilaterally.  Respiratory:  Bilateral expiratory  wheezing. GI: Soft, nontender, nondistended, BS + x 4. MS: no deformity or atrophy. Skin: warm and dry, no rash. Neuro:  Strength and sensation are intact. Psych: AAOx3.  Normal affect.  Labs    CBC  Recent Labs  09/03/16 1426 09/04/16 0209  WBC 9.5 7.7  HGB 16.0 14.6  HCT 46.0 41.6  MCV 92.2 91.6  PLT 197 161   Basic Metabolic Panel  Recent Labs  09/03/16 1426 09/04/16 0209  NA 139 136  K 4.3 3.8  CL 105 102  CO2 21* 25  GLUCOSE 210* 266*  BUN 23* 25*  CREATININE 1.61* 1.68*  CALCIUM 9.5 8.8*   Liver Function Tests No results for input(s): AST, ALT, ALKPHOS, BILITOT, PROT, ALBUMIN in the last 72 hours. No results for input(s): LIPASE, AMYLASE in the last 72 hours. Cardiac Enzymes  Recent Labs  09/03/16 2020 09/04/16 0209 09/04/16 0825  TROPONINI <0.03 <0.03 <0.03   BNP Invalid input(s): POCBNP D-Dimer No results for input(s): DDIMER in the last 72 hours. Hemoglobin A1C  Recent Labs  09/03/16 1736  HGBA1C  10.5*   Fasting Lipid Panel  Recent Labs  09/03/16 1736  CHOL 124  HDL 21*  LDLCALC UNABLE TO CALCULATE IF TRIGLYCERIDE OVER 400 mg/dL  TRIG 161*  CHOLHDL 5.9   Thyroid Function Tests No results for input(s): TSH, T4TOTAL, T3FREE, THYROIDAB in the last 72 hours.  Invalid input(s): FREET3  Telemetry    NSR - Personally Reviewed  ECG    n/a - Personally Reviewed  Radiology    Dg Chest 2 View  Result Date: 09/03/2016 CLINICAL DATA:  Chest pain and shortness of breath. EXAM: CHEST  2 VIEW COMPARISON:  01/20/2015 and prior radiographs FINDINGS: The cardiomediastinal silhouette is unremarkable. There is no evidence of focal airspace disease, pulmonary edema, suspicious pulmonary nodule/mass, pleural effusion, or pneumothorax. No acute bony abnormalities are identified. IMPRESSION: No active cardiopulmonary disease. Electronically Signed   By: Harmon Pier M.D.   On: 09/03/2016 15:02    Cardiac Studies   Stress test  pending  Patient Profile     71 y.o. male with history of CAD s/p multiple remote PCI, HTN, PAD, and CKD who presented to [facility] on 2/25 with mid=scapular pain c/w prior MI. He has ruled out.   Assessment & Plan    1. Angina: -Has ruled out -Currently pain-free -For Lexiscan Myoview this morning to evaluate for high-risk ischemia -Continue DAPT with ASA 81 mg and reduced dose Brilinta 60 mg bid -Hold metformin in case patient needs cardiac catheterization  -Continue metoprolol  2. HTN: -Well controlled -Continue current medications  4. HLD: -Lipitor  Signed, Eula Listen, PA-C [facility] Pager: [phone] 09/05/2016, 8:01 AM

## 2016-09-06 ENCOUNTER — Telehealth: Payer: Self-pay

## 2016-09-06 NOTE — Telephone Encounter (Signed)
Pt returned your call. He said call him back tomorrow morning.

## 2016-09-06 NOTE — Discharge Summary (Signed)
[place] [facility] [place] NAME: [name]    MR#:  [id number]  DATE OF BIRTH:  02/15/1946  DATE OF ADMISSION:  09/03/2016 ADMITTING PHYSICIAN: Gladstone Lighter, MD  DATE OF DISCHARGE: 09/05/2016  4:30 PM  PRIMARY CARE PHYSICIAN: Elsie Stain, MD    ADMISSION DIAGNOSIS:  Anginal equivalent ([place]) [I20.8] Acute midline back pain, unspecified back location [M54.9]  DISCHARGE DIAGNOSIS:  Active Problems:   Unstable angina ([place])   SECONDARY DIAGNOSIS:   Past Medical History:  Diagnosis Date  . CAD (coronary artery disease)    a. 1999: PCI-->LAD 2/2 MI; b. 2005: inf MI s/p PCI/DES x 4 to RCA; c. Myoview in 12/2005: EF 61%, no evidence for ischemia; d. cath 10/2014: occluded mLAD w/ L to L and L to R collats, dRCA 95% s/p PCI/DES 0%, mPDA 70%, LCx mild to mod irregs, procedure complicated by inf ST ele in recovery, repeat cath showed acute dRCA stent thrombosis o/w occluded mRPDA, PTCA dRCA, PCI/DES RPDA, aggrastat x 18 hr  . CKD (chronic kidney disease)   . COPD (chronic obstructive pulmonary disease) ([place])   . DM2 (diabetes mellitus, type 2) ([place])   . H/O hiatal hernia   . Heart attack   . HTN (hypertension)   . Hypercholesterolemia   . Impaired fasting glucose    Elevated after steroid injection  . Osteoarthritis of hip   . Osteoarthritis, knee   . PAD (peripheral artery disease) (HCC)    L fem to below the knee popliteal vein bypass graft  . Peri-rectal abscess 12/27/2011  . Tobacco abuse    Prior    HOSPITAL COURSE:   [name] a 71 y.o. malewith a known history of CAD status post LAD and RCA stents, hypertension, diabetes mellitus, peripheral vascular disease, arthritis, COPD and CKD presents from home secondary to angina equivalent pain between his shoulder blades.  #1 Unstable angina- patient's chest pain was very similar to his previous heart attacks. -Observed on telemetry, cardiac markers 3 have been  negative. -Seen by cardiology And underwent a nuclear medicine stress test which showed no wall motion abnormalities and normal ejection fraction. He is currently chest pain-free and hemodynamically stable and therefore being discharged home. -He will Continue aspirin, Brilinta, metoprolol, statin.  #2 HTN- he will cont metoprolol, Lisinopril upon discharge.   #3 DM- since hemoglobin A1c was noted to be higher than 10. He is currently only on metformin. He was seen by diabetes coordinator who recommended starting the patient on another agent. Patient was given a prescription for glipizide. He was advised to follow his blood sugars twice daily and follow up with his primary care physician.   #4 COPD - no acute exacerbation while in hospital.  - he will cont. His rescue albuterol inhaler.   DISCHARGE CONDITIONS:   Stable.   CONSULTS OBTAINED:  Treatment Team:  Evans Lance, MD  DRUG ALLERGIES:   Allergies  Allergen Reactions  . Augmentin [Amoxicillin-Pot Clavulanate] Other (See Comments)    Nausea,vomiting,diarrhea  . Morphine And Related Nausea And Vomiting    Vomiting, GI upset    DISCHARGE MEDICATIONS:   Allergies as of 09/05/2016      Reactions   Augmentin [amoxicillin-pot Clavulanate] Other (See Comments)   Nausea,vomiting,diarrhea   Morphine And Related Nausea And Vomiting   Vomiting, GI upset      Medication List    TAKE these medications   albuterol 108 (90 Base) MCG/ACT inhaler Commonly known as:  PROAIR HFA Inhale 2 puffs into the lungs 3 (three) times daily as needed (cough).   ANTIVERT 25 MG tablet Generic drug:  meclizine Take 25 mg by mouth every 6 (six) hours as needed. For dizziness/vertigo   aspirin 81 MG EC tablet Take 1 tablet (81 mg total) by mouth daily.   atorvastatin 40 MG tablet Commonly known as:  LIPITOR TAKE ONE TABLET BY MOUTH ONCE DAILY   benzonatate 200 MG capsule Commonly known as:  TESSALON TAKE ONE CAPSULE BY MOUTH THREE  TIMES DAILY AS NEEDED   fenofibrate 145 MG tablet Commonly known as:  TRICOR Take 1 tablet (145 mg total) by mouth daily.   glipiZIDE 5 MG tablet Commonly known as:  GLUCOTROL Take 1 tablet (5 mg total) by mouth daily before breakfast.   glucosamine-chondroitin 500-400 MG tablet Take 1 tablet by mouth 2 (two) times daily.   glucose blood test strip Commonly known as:  RELION CONFIRM/MICRO TEST Use as instructed to test blood sugar once daily as needed.  Diagnosis:  250.00  Non insulin-dependent.   hydrochlorothiazide 25 MG tablet Commonly known as:  HYDRODIURIL TAKE ONE TABLET BY MOUTH ONCE DAILY   lisinopril 40 MG tablet Commonly known as:  PRINIVIL,ZESTRIL TAKE ONE TABLET BY MOUTH ONCE DAILY   metFORMIN 500 MG tablet Commonly known as:  GLUCOPHAGE TAKE TWO TABLETS BY MOUTH TWICE DAILY WITH  A  MEAL   metoprolol tartrate 25 MG tablet Commonly known as:  LOPRESSOR TAKE ONE-HALF TABLET BY MOUTH TWICE DAILY   nitroGLYCERIN 0.4 MG SL tablet Commonly known as:  NITROSTAT Place 1 tablet (0.4 mg total) under the tongue every 5 (five) minutes as needed. For chest pain   PRESERVISION AREDS 2 PO Take 1 tablet by mouth 2 (two) times daily.   RELION CONFIRM GLUCOSE MONITOR w/Device Kit Check blood sugar once daily as needed.  Diagnosis:  250.00  Non-insulin dependent   RELION LANCETS MICRO-THIN 33G Misc Use to test blood sugar once daily as needed.  Diagnosis:  250.00   Non insulin dependent.   SYMBICORT 160-4.5 MCG/ACT inhaler Generic drug:  budesonide-formoterol INHALE TWO PUFFS BY MOUTH IN THE MORNING   ticagrelor 60 MG Tabs tablet Commonly known as:  BRILINTA Take 1 tablet (60 mg total) by mouth 2 (two) times daily.   ULTRAM 50 MG tablet Generic drug:  traMADol Take 1 tablet (50 mg total) by mouth every 6 (six) hours as needed.         DISCHARGE INSTRUCTIONS:   DIET:  Cardiac diet and Diabetic diet  DISCHARGE CONDITION:  Stable  ACTIVITY:  Activity as  tolerated  OXYGEN:  Home Oxygen: No.   Oxygen Delivery: room air  DISCHARGE LOCATION:  home   If you experience worsening of your admission symptoms, develop shortness of breath, life threatening emergency, suicidal or homicidal thoughts you must seek medical attention immediately by calling 911 or calling your MD immediately  if symptoms less severe.  You Must read complete instructions/literature along with all the possible adverse reactions/side effects for all the Medicines you take and that have been prescribed to you. Take any new Medicines after you have completely understood and accpet all the possible adverse reactions/side effects.   Please note  You were cared for by a hospitalist during your hospital stay. If you have any questions about your discharge medications or the care you received while you were in the hospital after you are discharged, you can call the unit and asked to speak with  the hospitalist on call if the hospitalist that took care of you is not available. Once you are discharged, your primary care physician will handle any further medical issues. Please note that NO REFILLS for any discharge medications will be authorized once you are discharged, as it is imperative that you return to your primary care physician (or establish a relationship with a primary care physician if you do not have one) for your aftercare needs so that they can reassess your need for medications and monitor your lab values.     Today   No chest pain, N/V or any other associated symptoms. Had stress test this a.m. But results pending. Family at bedside.   VITAL SIGNS:  Blood pressure 110/72, pulse 62, temperature 97.8 F (36.6 C), temperature source Oral, resp. rate 18, height _0  (1.727 m), weight 90.7 kg (200 lb), SpO2 96 %.  I/O:  No intake or output data in the 24 hours ending 09/06/16 1538  PHYSICAL EXAMINATION:  GENERAL:  71 y.o.-year-old patient lying in the bed with no  acute distress.  EYES: Pupils equal, round, reactive to light and accommodation. No scleral icterus. Extraocular muscles intact.  HEENT: Head atraumatic, normocephalic. Oropharynx and nasopharynx clear.  NECK:  Supple, no jugular venous distention. No thyroid enlargement, no tenderness.  LUNGS: Normal breath sounds bilaterally, no wheezing, rales,rhonchi. No use of accessory muscles of respiration.  CARDIOVASCULAR: S1, S2 normal. No murmurs, rubs, or gallops.  ABDOMEN: Soft, non-tender, non-distended. Bowel sounds present. No organomegaly or mass.  EXTREMITIES: No pedal edema, cyanosis, or clubbing.  NEUROLOGIC: Cranial nerves II through XII are intact. No focal motor or sensory defecits b/l.  PSYCHIATRIC: The patient is alert and oriented x 3. Good affect.  SKIN: No obvious rash, lesion, or ulcer.   DATA REVIEW:   CBC  Recent Labs Lab 09/04/16 0209  WBC 7.7  HGB 14.6  HCT 41.6  PLT 161    Chemistries   Recent Labs Lab 09/04/16 0209  NA 136  K 3.8  CL 102  CO2 25  GLUCOSE 266*  BUN 25*  CREATININE 1.68*  CALCIUM 8.8*    Cardiac Enzymes  Recent Labs Lab 09/04/16 0825  TROPONINI <0.03    Microbiology Results  Results for orders placed or performed during the hospital encounter of 12/28/11  Surgical pcr screen     Status: None   Collection Time: 12/28/11  4:44 PM  Result Value Ref Range Status   MRSA, PCR NEGATIVE NEGATIVE Final   Staphylococcus aureus NEGATIVE NEGATIVE Final    Comment:        The Xpert SA Assay (FDA approved for NASAL specimens only), is one component of a comprehensive surveillance program.  It is not intended to diagnose infection nor to guide or monitor treatment.  Culture, routine-abscess     Status: None   Collection Time: 12/28/11 10:18 PM  Result Value Ref Range Status   Specimen Description ABSCESS LEFT PERIRECTAL  Final   Special Requests NONE  Final   Gram Stain   Final    NO WBC SEEN RARE SQUAMOUS EPITHELIAL CELLS  PRESENT NO ORGANISMS SEEN   Culture NO GROWTH 3 DAYS  Final   Report Status 12/31/2011 FINAL  Final  Anaerobic culture     Status: None   Collection Time: 12/28/11 10:18 PM  Result Value Ref Range Status   Specimen Description ABSCESS LEFT PERIRECTAL  Final   Special Requests NONE  Final   Gram Stain   Final  NO WBC SEEN RARE SQUAMOUS EPITHELIAL CELLS PRESENT NO ORGANISMS SEEN   Culture MULTIPLE ORGANISMS PRESENT, NONE PREDOMINANT  Final   Report Status 01/04/2012 FINAL  Final    RADIOLOGY:  Nm Myocar Multi W/spect W/wall Motion / Ef  Result Date: 09/05/2016  There was no ST segment deviation noted during stress.  No T wave inversion was noted during stress.  Defect 1: There is a small defect of mild severity present in the apical anterior and apex location.  Findings consistent with mild distal LAD ischemia.  This is a low risk study.  The left ventricular ejection fraction is normal (55-65%).       Management plans discussed with the patient, family and they are in agreement.  CODE STATUS:  Code Status History    Date Active Date Inactive Code Status Order ID Comments User Context   09/03/2016  6:35 PM 09/05/2016  7:45 PM Full Code [id number]  Gladstone Lighter, MD Inpatient      TOTAL TIME TAKING CARE OF THIS PATIENT: 40 minutes.    Henreitta Leber M.D on 09/06/2016 at 3:38 PM  Between 7am to 6pm - Pager - [phone]  After 6pm go to [email or web address] - Proofreader  [facility] [place]  Office  [phone]  CC: Primary care physician; Elsie Stain, MD

## 2016-09-07 NOTE — Telephone Encounter (Signed)
Transition Care Management Follow-up Telephone Call    Date discharged? 09/05/16   How have you been since you were released from the hospital? Recovering.   Any patient concerns? None. Patient returned to work on 09/06/16.   Items Reviewed:  Medications reviewed: Yes  Allergies reviewed: Yes  Dietary changes reviewed: Yes  Referrals reviewed: N/A   Functional Questionnaire:  Independent - I Dependent - D    Activities of Daily Living (ADLs):    Personal hygiene - I Dressing - I Eating - I Maintaining continence - I Transferring - I   Independent Activities of Daily Living (iADLs): Basic communication skills - I Transportation - I Meal preparation  - I Shopping - I Housework - I Managing medications - I  Managing personal finances - I   Confirmed importance and date/time of follow-up visits scheduled YES  Provider Appointment booked with PCP 09/12/16 @ 1215  Confirmed with patient if condition begins to worsen call PCP or go to the ER.  Patient was given the office number and encouraged to call back with question or concerns: YES

## 2016-09-12 ENCOUNTER — Encounter: Payer: Self-pay | Admitting: Family Medicine

## 2016-09-12 ENCOUNTER — Ambulatory Visit (INDEPENDENT_AMBULATORY_CARE_PROVIDER_SITE_OTHER): Payer: Medicare Other | Admitting: Family Medicine

## 2016-09-12 DIAGNOSIS — I2510 Atherosclerotic heart disease of native coronary artery without angina pectoris: Secondary | ICD-10-CM

## 2016-09-12 DIAGNOSIS — E1151 Type 2 diabetes mellitus with diabetic peripheral angiopathy without gangrene: Secondary | ICD-10-CM | POA: Diagnosis not present

## 2016-09-12 DIAGNOSIS — Z9119 Patient's noncompliance with other medical treatment and regimen: Secondary | ICD-10-CM | POA: Diagnosis not present

## 2016-09-12 MED ORDER — PEN NEEDLES 31G X 5 MM MISC: 1.0000 | Freq: Every day | 3 refills | Status: DC

## 2016-09-12 MED ORDER — INSULIN GLARGINE 100 UNIT/ML SOLOSTAR PEN: PEN_INJECTOR | SUBCUTANEOUS | 99 refills | Status: DC

## 2016-09-12 MED ORDER — NITROGLYCERIN 0.4 MG SL SUBL: 0.4000 mg | SUBLINGUAL_TABLET | SUBLINGUAL | 12 refills | Status: DC | PRN

## 2016-09-12 NOTE — Progress Notes (Signed)
Pre visit review using our clinic review tool, if applicable. No additional management support is needed unless otherwise documented below in the visit note. 

## 2016-09-12 NOTE — Patient Instructions (Signed)
Check with your insurance about insulin pen coverage.  See if basaglar, lantus or levemir will be cheapest.   I sent in lantus and pen needles.  Let me know if I need to change the insulin due to cost.  They all work the same way.  When you get the pens and needles, then schedule an appointment in the next few days and bring it all to the office.  I'll help you with your 1st shot here at the clinic.  We'll go from there.  Take care.  Glad to see you.

## 2016-09-12 NOTE — Progress Notes (Signed)
DATE OF ADMISSION:  09/03/2016    ADMITTING PHYSICIAN: Enid Baasadhika Kalisetti, MD  DATE OF DISCHARGE: 09/05/2016  4:30 PM  PRIMARY CARE PHYSICIAN: Crawford GivensGraham Jaymes Hang, MD    ADMISSION DIAGNOSIS:  Anginal equivalent (HCC) [I20.8] Acute midline back pain, unspecified back location [M54.9]  DISCHARGE DIAGNOSIS:  Active Problems:   Unstable angina (HCC)   SECONDARY DIAGNOSIS:       Past Medical History:  Diagnosis Date  . CAD (coronary artery disease)    a. 1999: PCI-->LAD 2/2 MI; b. 2005: inf MI s/p PCI/DES x 4 to RCA; c. Myoview in 12/2005: EF 61%, no evidence for ischemia; d. cath 10/2014: occluded mLAD w/ L to L and L to R collats, dRCA 95% s/p PCI/DES 0%, mPDA 70%, LCx mild to mod irregs, procedure complicated by inf ST ele in recovery, repeat cath showed acute dRCA stent thrombosis o/w occluded mRPDA, PTCA dRCA, PCI/DES RPDA, aggrastat x 18 hr  . CKD (chronic kidney disease)   . COPD (chronic obstructive pulmonary disease) (HCC)   . DM2 (diabetes mellitus, type 2) (HCC)   . H/O hiatal hernia   . Heart attack   . HTN (hypertension)   . Hypercholesterolemia   . Impaired fasting glucose    Elevated after steroid injection  . Osteoarthritis of hip   . Osteoarthritis, knee   . PAD (peripheral artery disease) (HCC)    L fem to below the knee popliteal vein bypass graft  . Peri-rectal abscess 12/27/2011  . Tobacco abuse    Prior    HOSPITAL COURSE:   [name] a 71 y.o. malewith a known history of CAD status post LAD and RCA stents, hypertension, diabetes mellitus, peripheral vascular disease, arthritis, COPD and CKD presents from home secondary to angina equivalent pain between his shoulder blades.  #1 Unstable angina- patient's chest pain was very similar to his previous heart attacks. -Observed on telemetry, cardiac markers 3 have been negative. -Seen by cardiology and underwent a nuclear medicine stress test which showed no wall motion  abnormalities and normal ejection fraction. He is currently chest pain-free and hemodynamically stable and therefore being discharged home. -He will Continue aspirin, Brilinta, metoprolol, statin.  #2 HTN- he will cont metoprolol, Lisinopril upon discharge.   #3 DM- since hemoglobin A1c was noted to be higher than 10. He is currently only on metformin. He was seen by diabetes coordinator who recommended starting the patient on another agent. Patient was given a prescription for glipizide. He was advised to follow his blood sugars twice daily and follow up with his primary care physician.   #4 COPD - no acute exacerbation while in hospital.  - he will cont. His rescue albuterol inhaler.   DISCHARGE CONDITIONS:   Stable.   CONSULTS OBTAINED:  Treatment Team:  Marinus MawGregg W Taylor, MD ================================================= Patient was seen in the hospital for pain that was similar to his previous heart attacks. Inpatient cardiac evaluation was fortunately negative. He was discharged home after stress test. He still has uncontrolled diabetes, amongst other risk factors. He is upset about having to go to the hospital on the first place but I told him I had no control over that and I moved on.  He was previously lost to follow up with DM2.   D/w pt.  The instructions were to f/u in 3 months after last OV, so he was due to be seen and approximately August of last year.  He never updated me about his sugar and he never followed up. A1c is  increasingly high. He reports his diet to be good and doesn't understand why his A1c is still up.  PMH and SH reviewed  ROS: Per HPI unless specifically indicated in ROS section   Meds, vitals, and allergies reviewed.   GEN: nad, alert and oriented HEENT: mucous membranes moist NECK: supple w/o LA CV: rrr. PULM: ctab, no inc wob ABD: soft, +bs EXT: no edema SKIN: no acute rash

## 2016-09-13 ENCOUNTER — Other Ambulatory Visit: Payer: Self-pay | Admitting: *Deleted

## 2016-09-13 MED ORDER — PEN NEEDLES 31G X 6 MM MISC: 3 refills | Status: DC

## 2016-09-13 NOTE — Assessment & Plan Note (Signed)
He didn't believe his last outpatient A1c was accurate. This was in May of last year. I asked him to check his sugar before and after a meal so we can verify his results. He never followed up about that. He was supposed follow-up last August. He never did that. I'd previously given him written and verbal instructions about both, which he previously voiced understanding. In the meantime his A1c is still up. It is increasingly worse. His blood sugars into the 300s now. His medications were adjusted as an inpatient, but only with oral medications.  I explained to him that in spite of a good diet he was likely going to have uncontrolled diabetes. He needs insulin. There is low likelihood of him getting his sugar under control otherwise. This is likely the safest and best option. Discussed with patient about insulin start. Discussed with him about insulin pen use. Model use demonstrated. All questions answered. He agrees to start. He will get the prescription and come back to the clinic later this week and we will start his treatment.

## 2016-09-13 NOTE — Assessment & Plan Note (Signed)
He had negative inpatient testing. I will route this to cardiology to see if they want to change anything else in the meantime as an outpatient. He needs risk factor reduction, see discussion about diabetes. He is chest pain-free at this point.

## 2016-09-21 ENCOUNTER — Ambulatory Visit (INDEPENDENT_AMBULATORY_CARE_PROVIDER_SITE_OTHER): Payer: Medicare Other | Admitting: Family Medicine

## 2016-09-21 ENCOUNTER — Other Ambulatory Visit: Payer: Self-pay

## 2016-09-21 ENCOUNTER — Encounter: Payer: Self-pay | Admitting: Family Medicine

## 2016-09-21 DIAGNOSIS — I208 Other forms of angina pectoris: Secondary | ICD-10-CM

## 2016-09-21 DIAGNOSIS — E1151 Type 2 diabetes mellitus with diabetic peripheral angiopathy without gangrene: Secondary | ICD-10-CM | POA: Diagnosis not present

## 2016-09-21 MED ORDER — ATORVASTATIN CALCIUM 40 MG PO TABS: 40.0000 mg | ORAL_TABLET | Freq: Every day | ORAL | 3 refills | Status: DC

## 2016-09-21 NOTE — Telephone Encounter (Signed)
Requested Prescriptions   Signed Prescriptions Disp Refills  . atorvastatin (LIPITOR) 40 MG tablet 90 tablet 3    Sig: Take 1 tablet (40 mg total) by mouth daily.    Authorizing Provider: GOLLAN, TIMOTHY J    Ordering User: Yasseen Salls N    

## 2016-09-21 NOTE — Progress Notes (Signed)
Pre visit review using our clinic review tool, if applicable. No additional management support is needed unless otherwise documented below in the visit note. 

## 2016-09-21 NOTE — Patient Instructions (Signed)
Let me know if any becomes cheaper: basaglar, lantus or levemir.   If AM sugar > 150, add 1 unit that night.  If <100, then cut back 1 unit.  If 100-150, then no change in dose.  Update me in a few days.   Take care.

## 2016-09-22 ENCOUNTER — Telehealth: Payer: Self-pay

## 2016-09-22 NOTE — Assessment & Plan Note (Signed)
>  25 minutes spent in face to face time with patient, >50% spent in counselling or coordination of care.  Insulin use and dosing discussed with patient. Handout given to patient with instructions for pen.  Discussed. Demonstration with model insulin pen done at visit. All questions answered. We discussed routine aseptic injection technique using an insulin pen. Maintenance of pen discussed. Proper disposal of needles discussed. He was able to inject 5 units successfully per routine. See after visit summary regarding dosing. He will update me in a few days. Plan on recheck A1c in 3 months but he'll be in touch in the meantime.

## 2016-09-22 NOTE — Progress Notes (Signed)
Follow up for DM2 insulin teaching and discussion.  See plan.   Meds, vitals, and allergies reviewed.   ROS: Per HPI unless specifically indicated in ROS section   nad

## 2016-09-22 NOTE — Telephone Encounter (Signed)
Pt left v/m; pt seen 09-21-16 and pt started taking insulin and pt wants to know if he should continue taking the glipizide and metformin. Pt request cb.

## 2016-09-23 NOTE — Telephone Encounter (Signed)
Spoke to pt

## 2016-09-23 NOTE — Telephone Encounter (Signed)
Yes, continue both for now along with insulin. Thanks.

## 2016-09-26 ENCOUNTER — Telehealth: Payer: Self-pay

## 2016-09-26 NOTE — Telephone Encounter (Signed)
Pt left v/m wanting to get email for Dr Para Marchuncan to send in BS readings. I spoke with Mrs [name] and advised if pt thinks too detailed to give over phone could [facility]. She voiced understanding and will let pt know.

## 2016-10-17 ENCOUNTER — Other Ambulatory Visit: Payer: Self-pay | Admitting: *Deleted

## 2016-10-17 MED ORDER — LISINOPRIL 40 MG PO TABS: 40.0000 mg | ORAL_TABLET | Freq: Every day | ORAL | 3 refills | Status: DC

## 2016-11-04 ENCOUNTER — Telehealth: Payer: Self-pay | Admitting: Family Medicine

## 2016-11-04 NOTE — Telephone Encounter (Signed)
Pt dropped off a comprehensive readings for your review. I labeled and placed on cart for your review.

## 2016-11-08 NOTE — Telephone Encounter (Signed)
Tried cell #, no answer, no VM set up.  Tried home #, wife says pt is at work and is probably in a meeting.  Will call again later on cell.

## 2016-11-08 NOTE — Telephone Encounter (Signed)
Call patient. Many thanks for his sugar log. I appreciate his effort to work on this. His  sugars look better in general.  He previously had a.m. sugars above 200, now lower. Would be okay to still increase his insulin some. If his a.m. sugar is above 130 then I would add 1 unit on the next dose of insulin. There is no clear-cut maximum dose. If a.m. sugar is below 90 then I would decrease 1 unit on the next dose of insulin.  If his a.m. sugar is between 90 and 130 then I would not change that day's dose. I would like to recheck his A1c before an office visit in early June, when convenient for the patient. Many thanks.

## 2016-11-08 NOTE — Telephone Encounter (Signed)
Patient advised.   Lab and ROV appts scheduled.

## 2016-11-11 ENCOUNTER — Telehealth: Payer: Self-pay | Admitting: *Deleted

## 2016-11-11 MED ORDER — GLIPIZIDE 5 MG PO TABS: 5.0000 mg | ORAL_TABLET | Freq: Every day | ORAL | 1 refills | Status: DC

## 2016-11-11 NOTE — Telephone Encounter (Signed)
Would continue. rx sent.  Thanks.  

## 2016-11-11 NOTE — Telephone Encounter (Signed)
Spoke to pt who states he was prescribed glipizide while in the hospital. He is wanting to know if he is to continue or if he should d/c as he is currently out of meds. If needing to continue, a new Rx is needed and can be sent to the pharmacy on file.

## 2016-11-11 NOTE — Telephone Encounter (Signed)
Patient advised.

## 2016-11-20 ENCOUNTER — Other Ambulatory Visit: Payer: Self-pay | Admitting: Family Medicine

## 2016-12-22 ENCOUNTER — Other Ambulatory Visit (INDEPENDENT_AMBULATORY_CARE_PROVIDER_SITE_OTHER): Payer: Medicare Other

## 2016-12-22 DIAGNOSIS — E1151 Type 2 diabetes mellitus with diabetic peripheral angiopathy without gangrene: Secondary | ICD-10-CM

## 2016-12-22 LAB — HEMOGLOBIN A1C: Hgb A1c MFr Bld: 8.3 % — ABNORMAL HIGH (ref 4.6–6.5)

## 2016-12-29 ENCOUNTER — Ambulatory Visit (INDEPENDENT_AMBULATORY_CARE_PROVIDER_SITE_OTHER): Payer: Medicare Other | Admitting: Family Medicine

## 2016-12-29 ENCOUNTER — Encounter: Payer: Self-pay | Admitting: Family Medicine

## 2016-12-29 VITALS — BP 102/64 | HR 69 | Temp 98.4°F | Wt 204.8 lb

## 2016-12-29 DIAGNOSIS — I208 Other forms of angina pectoris: Secondary | ICD-10-CM

## 2016-12-29 DIAGNOSIS — L989 Disorder of the skin and subcutaneous tissue, unspecified: Secondary | ICD-10-CM | POA: Diagnosis not present

## 2016-12-29 DIAGNOSIS — E1151 Type 2 diabetes mellitus with diabetic peripheral angiopathy without gangrene: Secondary | ICD-10-CM | POA: Diagnosis not present

## 2016-12-29 DIAGNOSIS — M25519 Pain in unspecified shoulder: Secondary | ICD-10-CM | POA: Diagnosis not present

## 2016-12-29 MED ORDER — INSULIN GLARGINE 100 UNIT/ML SOLOSTAR PEN: PEN_INJECTOR | SUBCUTANEOUS | 99 refills | Status: DC

## 2016-12-29 NOTE — Progress Notes (Signed)
L>R shoulder/rotator cuff sx.  Pain with ROM, overhead movement. Tylenol helps some.  Pain laying on side at night. Hurts more to play golf.    8mm slowly enlarging lesion on the L forearm.  No ulcerated.  Present for about 10 years.   D/w pt.  See plan.   He has hearing aids and is trying to get used to them.    Diabetes:  Using medications without difficulties: yes Hypoglycemic episodes: no Hyperglycemic episodes: no Feet problems: no new sx, he has L leg numbness laterally at baseline.   Blood Sugars averaging: 130-150, 200s at night.   eye exam within last year: Usually ~35 units of insulin a day.   A1c improved, d/w pt.  It may lag his progress some.   PMH and SH reviewed   Meds, vitals, and allergies reviewed.   ROS: Per HPI unless specifically indicated in ROS section   GEN: nad, alert and oriented HEENT: mucous membranes moist NECK: supple w/o LA CV: rrr. PULM: ctab, no inc wob ABD: soft, +bs EXT: no edema SKIN: no acute rash but 8mm pinkish lesion on the L forearm. Flat, well-demarcated border, no ulceration. Not rough feeling. L shoulder w/o arm drop but pain on int/ext rotation with supraspinatus weakness.    Diabetic foot exam: Normal inspection No skin breakdown No calluses  Normal DP pulses Normal sensation to light touch and monofilament Nails normal

## 2016-12-29 NOTE — Patient Instructions (Addendum)
Recheck labs in about another 3 months.   Take care.  Glad to see you.  Update me as needed.

## 2016-12-30 ENCOUNTER — Encounter: Payer: Self-pay | Admitting: Family Medicine

## 2016-12-30 DIAGNOSIS — L989 Disorder of the skin and subcutaneous tissue, unspecified: Secondary | ICD-10-CM | POA: Insufficient documentation

## 2016-12-30 DIAGNOSIS — M25519 Pain in unspecified shoulder: Secondary | ICD-10-CM | POA: Insufficient documentation

## 2016-12-30 NOTE — Assessment & Plan Note (Signed)
Not an acute issue. Gradually worse recently. Likely rotator cuff symptoms, left more than right. Anatomy discussed with patient. Handout discussed with patient. D/w pt about home exercises. He can try that and then ask about seeing Dr. Patsy Lager[place] if not better in the near future. He agrees.

## 2016-12-30 NOTE — Assessment & Plan Note (Signed)
Not yet controlled but with significant improvement in A1c on this checked. Discussed with patient. His A1c may lag his progress some. His morning sugars are clearly better. Continue as is for now and recheck A1c in about 3 months. He may still have some significant improvement in his A1c without changing his medications at this point. He agrees. >25 minutes spent in face to face time with patient, >50% spent in counselling or coordination of care.

## 2016-12-30 NOTE — Assessment & Plan Note (Signed)
This is still small. It is not ulcerated. It does not look typical for a basal cell, squamous cell, or melanoma. It has been present for about 10 years and only minimally changed in size per patient. Would be reasonable to observe. If he wanted excision he can send him over to dermatology but this does not appear to be mandatory at this point. Discussed with patient.

## 2017-01-02 ENCOUNTER — Other Ambulatory Visit: Payer: Self-pay

## 2017-01-02 MED ORDER — LISINOPRIL 40 MG PO TABS: 40.0000 mg | ORAL_TABLET | Freq: Every day | ORAL | 3 refills | Status: DC

## 2017-01-02 NOTE — Telephone Encounter (Signed)
Refill sent for lisinopril 40 mg  

## 2017-01-08 ENCOUNTER — Other Ambulatory Visit: Payer: Self-pay | Admitting: Family Medicine

## 2017-01-08 DIAGNOSIS — I6529 Occlusion and stenosis of unspecified carotid artery: Secondary | ICD-10-CM

## 2017-01-08 NOTE — Progress Notes (Signed)
Call pt.  Due for f/u carotid u/s. Thanks.   Patient advised and says someone called this morning to schedule.

## 2017-01-10 ENCOUNTER — Other Ambulatory Visit: Payer: Self-pay | Admitting: Cardiovascular Disease

## 2017-01-25 ENCOUNTER — Ambulatory Visit: Payer: Medicare Other

## 2017-01-25 DIAGNOSIS — I6523 Occlusion and stenosis of bilateral carotid arteries: Secondary | ICD-10-CM | POA: Diagnosis not present

## 2017-01-25 DIAGNOSIS — I6529 Occlusion and stenosis of unspecified carotid artery: Secondary | ICD-10-CM

## 2017-01-27 ENCOUNTER — Other Ambulatory Visit: Payer: Self-pay

## 2017-02-16 DIAGNOSIS — E113393 Type 2 diabetes mellitus with moderate nonproliferative diabetic retinopathy without macular edema, bilateral: Secondary | ICD-10-CM | POA: Diagnosis not present

## 2017-03-13 ENCOUNTER — Other Ambulatory Visit: Payer: Self-pay | Admitting: Cardiovascular Disease

## 2017-03-27 ENCOUNTER — Other Ambulatory Visit: Payer: Self-pay | Admitting: Family Medicine

## 2017-03-27 DIAGNOSIS — E1151 Type 2 diabetes mellitus with diabetic peripheral angiopathy without gangrene: Secondary | ICD-10-CM

## 2017-03-28 ENCOUNTER — Other Ambulatory Visit (INDEPENDENT_AMBULATORY_CARE_PROVIDER_SITE_OTHER): Payer: Medicare Other

## 2017-03-28 DIAGNOSIS — E1151 Type 2 diabetes mellitus with diabetic peripheral angiopathy without gangrene: Secondary | ICD-10-CM

## 2017-03-28 LAB — BASIC METABOLIC PANEL
BUN: 23 mg/dL (ref 6–23)
CHLORIDE: 103 meq/L (ref 96–112)
CO2: 28 mEq/L (ref 19–32)
Calcium: 9.6 mg/dL (ref 8.4–10.5)
Creatinine, Ser: 1.47 mg/dL (ref 0.40–1.50)
GFR: 50.06 mL/min — ABNORMAL LOW (ref >60.00)
Glucose, Bld: 173 mg/dL — ABNORMAL HIGH (ref 70–99)
POTASSIUM: 4 meq/L (ref 3.5–5.1)
Sodium: 139 mEq/L (ref 135–145)

## 2017-03-28 LAB — HEMOGLOBIN A1C: HEMOGLOBIN A1C: 8.4 % — AB (ref 4.6–6.5)

## 2017-03-31 ENCOUNTER — Ambulatory Visit (INDEPENDENT_AMBULATORY_CARE_PROVIDER_SITE_OTHER): Payer: Medicare Other | Admitting: Family Medicine

## 2017-03-31 ENCOUNTER — Ambulatory Visit (INDEPENDENT_AMBULATORY_CARE_PROVIDER_SITE_OTHER): Payer: Medicare Other

## 2017-03-31 ENCOUNTER — Encounter: Payer: Self-pay | Admitting: Family Medicine

## 2017-03-31 ENCOUNTER — Ambulatory Visit (INDEPENDENT_AMBULATORY_CARE_PROVIDER_SITE_OTHER)
Admission: RE | Admit: 2017-03-31 | Discharge: 2017-03-31 | Disposition: A | Discharge status: Home / Self Care | Payer: Medicare Other | Source: Ambulatory Visit | Attending: Family Medicine | Admitting: Family Medicine

## 2017-03-31 VITALS — BP 110/58 | HR 68 | Temp 98.8°F | Ht 67.5 in | Wt 206.5 lb

## 2017-03-31 DIAGNOSIS — I714 Abdominal aortic aneurysm, without rupture, unspecified: Secondary | ICD-10-CM

## 2017-03-31 DIAGNOSIS — L989 Disorder of the skin and subcutaneous tissue, unspecified: Secondary | ICD-10-CM

## 2017-03-31 DIAGNOSIS — Z0000 Encounter for general adult medical examination without abnormal findings: Secondary | ICD-10-CM | POA: Diagnosis not present

## 2017-03-31 DIAGNOSIS — E1151 Type 2 diabetes mellitus with diabetic peripheral angiopathy without gangrene: Secondary | ICD-10-CM

## 2017-03-31 DIAGNOSIS — E782 Mixed hyperlipidemia: Secondary | ICD-10-CM | POA: Diagnosis not present

## 2017-03-31 DIAGNOSIS — I10 Essential (primary) hypertension: Secondary | ICD-10-CM

## 2017-03-31 DIAGNOSIS — M25512 Pain in left shoulder: Secondary | ICD-10-CM

## 2017-03-31 DIAGNOSIS — I208 Other forms of angina pectoris: Secondary | ICD-10-CM | POA: Diagnosis not present

## 2017-03-31 DIAGNOSIS — Z23 Encounter for immunization: Secondary | ICD-10-CM | POA: Diagnosis not present

## 2017-03-31 DIAGNOSIS — Z7189 Other specified counseling: Secondary | ICD-10-CM

## 2017-03-31 NOTE — Progress Notes (Signed)
Diabetes:  Using medications without difficulties:yes Hypoglycemic episodes:no Hyperglycemic episodes:no Feet problems:no Blood Sugars averaging: variable usually ~ 140-160.  eye exam within last year: yes A1c not at goal but better then prev high level.  Diet and exercise d/w pt.  Weight is up some.   Hypertension:    Using medication without problems or lightheadedness: yes Chest pain with exertion:no Edema:no Short of breath:no  Elevated Cholesterol: Using medications without problems:yes Muscle aches:  B shoulder, L>R, aches.  Likely not statin related. See below.  Diet compliance: encouraged.   Exercise: limited by shoulder pain/joint pain.    Advance directive- wife designated if patient were incapacitated.   Shoulder pain.  Continues to have pain.  Unclear if med related.  Started about the time he had started lantus.  L>R pain.    AAA and PVD per cards/vasc.  He doesn't have skin ulcers or skin breakdown.    PMH and SH reviewed  Meds, vitals, and allergies reviewed.   ROS: Per HPI unless specifically indicated in ROS section   GEN: nad, alert and oriented HEENT: mucous membranes moist NECK: supple w/o LA CV: rrr. PULM: ctab, no inc wob ABD: soft, +bs EXT: no edema R ankle, L ankle puffy at baseline.  SKIN: no acute rash but 8mm pinkish lesion on the L forearm. Flat, well-demarcated border, no ulceration. No change from prev.  He declined tx at this point- offered derm referral vs liq N2 tx.   L shoulder with pain on int>ext rotation.  No arm drop.

## 2017-03-31 NOTE — Patient Instructions (Addendum)
Check with your insurance to see if they will cover the shingrix shot. Go to the lab on the way out.  We'll contact you with your xray report. We may need to get you to see Dr. Patsy Lager.   Keep working on [employer].  Recheck in 3-4 months, labs ahead of time.  Take care.  Glad to see you.

## 2017-03-31 NOTE — Progress Notes (Signed)
Subjective:   [name] is a 71 y.o. male who presents for Medicare Annual/Subsequent preventive examination.  Review of Systems:  N/A Cardiac Risk Factors include: advanced age (>49mn, >>48women);diabetes mellitus;dyslipidemia;male gender;hypertension;obesity (BMI >30kg/m2)     Objective:    Vitals: BP (!) 110/58 (BP Location: Right Arm, Patient Position: Sitting, Cuff Size: Normal)   Pulse 68   Temp 98.8 F (37.1 C) (Oral)   Ht 5' 7.5" (1.715 m) Comment: no shoes  Wt 206 lb 8 oz (93.7 kg)   SpO2 95%   BMI 31.87 kg/m   Body mass index is 31.87 kg/m.  Tobacco History  Smoking Status  . Former Smoker  . Packs/day: 1.00  . Years: 40.00  . Types: Cigarettes  . Quit date: 10/21/1998  Smokeless Tobacco  . Never Used    Comment: Past smoker, states he will smoke maybe 1 cigarette/ month or so still     Counseling given: No   Past Medical History:  Diagnosis Date  . CAD (coronary artery disease)    a. 1999: PCI-->LAD 2/2 MI; b. 2005: inf MI s/p PCI/DES x 4 to RCA; c. Myoview in 12/2005: EF 61%, no evidence for ischemia; d. cath 10/2014: occluded mLAD w/ L to L and L to R collats, dRCA 95% s/p PCI/DES 0%, mPDA 70%, LCx mild to mod irregs, procedure complicated by inf ST ele in recovery, repeat cath showed acute dRCA stent thrombosis o/w occluded mRPDA, PTCA dRCA, PCI/DES RPDA, aggrastat x 18 hr  . CKD (chronic kidney disease)   . COPD [place] obstructive [place] disease) [place] [place]   . DM2 (diabetes mellitus, type 2) ([facility] [place]   . H/O hiatal hernia   . Heart attack ([facility] [place]   . HTN (hypertension)   . Hypercholesterolemia   . Impaired fasting glucose    Elevated after steroid injection  . Osteoarthritis of hip   . Osteoarthritis, knee   . PAD (peripheral artery disease) ([facility])    L fem to below the knee popliteal vein bypass graft  . Peri-rectal abscess 12/27/2011  . Tobacco abuse    Prior   Past Surgical History:  Procedure Laterality Date  . CORONARY ANGIOPLASTY    .  [place] 2006   Multiple, LAD in 1999, RCA in 2005  . FEMORAL-POPLITEAL BYPASS GRAFT  2016   L fem to below the knee popliteal vein bypass graft  . INCISE AND DRAIN ABCESS  2007   on scrotum  . INCISION AND DRAINAGE PERIRECTAL ABSCESS  12/28/2011   Procedure: IRRIGATION AND DEBRIDEMENT PERIRECTAL ABSCESS;  Surgeon: PMerrie Roof MD;  Location: [facility] OR;  Service: General;  Laterality: N/A;   Family History  Problem Relation Age of Onset  . Heart failure Father        CHF  . CAD Mother   . Colon cancer Neg Hx   . Prostate cancer Neg Hx    History  Sexual Activity  . Sexual activity: No    Outpatient Encounter Prescriptions as of 03/31/2017  Medication Sig  . albuterol (PROAIR HFA) 108 (90 BASE) MCG/ACT inhaler Inhale 2 puffs into the lungs 3 (three) times daily as needed (cough).  .Marland Kitchenaspirin 81 MG EC tablet Take 1 tablet (81 mg total) by mouth daily.  .Marland Kitchenatorvastatin (LIPITOR) 40 MG tablet Take 1 tablet (40 mg total) by mouth daily.  . benzonatate (TESSALON) 200 MG capsule TAKE ONE CAPSULE BY MOUTH THREE TIMES DAILY AS NEEDED  . Blood Glucose  Monitoring Suppl (RELION CONFIRM GLUCOSE MONITOR) W/DEVICE KIT Check blood sugar once daily as needed.  Diagnosis:  250.00  Non-insulin dependent  . fenofibrate (TRICOR) 145 MG tablet Take 1 tablet (145 mg total) by mouth daily.  Marland Kitchen glipiZIDE (GLUCOTROL) 5 MG tablet Take 1 tablet (5 mg total) by mouth daily before breakfast.  . glucosamine-chondroitin 500-400 MG tablet Take 1 tablet by mouth 2 (two) times daily.  Marland Kitchen glucose blood (RELION CONFIRM/MICRO TEST) test strip Use as instructed to test blood sugar once daily as needed.  Diagnosis:  250.00  Non insulin-dependent.  . hydrochlorothiazide (HYDRODIURIL) 25 MG tablet TAKE ONE TABLET BY MOUTH ONCE DAILY  . Insulin Glargine (LANTUS SOLOSTAR) 100 UNIT/ML Solostar Pen Inject up to 35 units per day  . Insulin Pen Needle (PEN NEEDLES) 31G X 6 MM MISC Use as directed with insulin  pen  . lisinopril (PRINIVIL,ZESTRIL) 40 MG tablet Take 1 tablet (40 mg total) by mouth daily.  . meclizine (ANTIVERT) 25 MG tablet Take 25 mg by mouth every 6 (six) hours as needed. For dizziness/vertigo  . metFORMIN (GLUCOPHAGE) 500 MG tablet TAKE TWO TABLETS BY MOUTH TWICE DAILY WITH  A  MEAL  . metoprolol tartrate (LOPRESSOR) 25 MG tablet TAKE 1/2 (ONE-HALF) TABLET BY MOUTH TWICE DAILY  . Multiple Vitamins-Minerals (PRESERVISION AREDS 2 PO) Take 1 tablet by mouth 2 (two) times daily.  . nitroGLYCERIN (NITROSTAT) 0.4 MG SL tablet Place 1 tablet (0.4 mg total) under the tongue every 5 (five) minutes as needed. For chest pain  . RELION LANCETS MICRO-THIN 33G MISC Use to test blood sugar once daily as needed.  Diagnosis:  250.00   Non insulin dependent.  . SYMBICORT 160-4.5 MCG/ACT inhaler INHALE TWO PUFFS BY MOUTH ONCE DAILY IN THE MORNING  . ticagrelor (BRILINTA) 60 MG TABS tablet Take 1 tablet (60 mg total) by mouth 2 (two) times daily.  . traMADol (ULTRAM) 50 MG tablet Take 1 tablet (50 mg total) by mouth every 6 (six) hours as needed.   No facility-administered encounter medications on file as of 03/31/2017.     Activities of Daily Living In your present state of health, do you have any difficulty performing the following activities: 03/31/2017 09/03/2016  Hearing? Y N  Vision? N N  Difficulty concentrating or making decisions? N N  Walking or climbing stairs? N N  Dressing or bathing? N N  Doing errands, shopping? N N  Preparing Food and eating ? N -  Using the Toilet? N -  In the past six months, have you accidently leaked urine? N -  Do you have problems with loss of bowel control? N -  Managing your Medications? N -  Managing your Finances? N -  Housekeeping or managing your Housekeeping? N -  Some recent data might be hidden    Patient Care Team: Tonia Ghent, MD as PCP - General   Assessment:    Hearing Screening Comments: Bilateral hearing aids Vision Screening  Comments: Last vision exam in August 2018 with Dr. Lorie Apley  Exercise Activities and Dietary recommendations Current Exercise Habits: The patient does not participate in regular exercise at present, Exercise limited by: None identified  Goals    .  A1C within normal range          Starting 03/31/2017, I will continue to monitor intake of simple carbohydrates by focusing on eating lower glycemic foods and drinking diet soda.       Fall Risk Fall Risk  03/31/2017 03/31/2017  11/20/2015  Falls in the past year? No No No   Depression Screen PHQ 2/9 Scores 03/31/2017 12/29/2016 11/20/2015  PHQ - 2 Score 0 0 0  PHQ- 9 Score 0 - -    Cognitive Function MMSE - Mini Mental State Exam 03/31/2017 11/20/2015  Orientation to time 5 5  Orientation to Place 5 5  Registration 3 3  Attention/ Calculation 0 0  Recall 2 3  Recall-comments pt was unable to recall 1 of 3 words -  Language- name 2 objects 0 0  Language- repeat 1 1  Language- follow 3 step command 3 3  Language- read & follow direction 0 0  Write a sentence 0 0  Copy design 0 0  Total score 19 20     PLEASE NOTE: A Mini-Cog screen was completed. Maximum score is 20. A value of 0 denotes this part of Folstein MMSE was not completed or the patient failed this part of the Mini-Cog screening.   Mini-Cog Screening Orientation to Time - Max 5 pts Orientation to Place - Max 5 pts Registration - Max 3 pts Recall - Max 3 pts Language Repeat - Max 1 pts Language Follow 3 Step Command - Max 3 pts     Immunization History  Administered Date(s) Administered  . Influenza,inj,Quad PF,6+ Mos 07/16/2013, 04/21/2014, 06/02/2015, 06/10/2016, 03/31/2017  . Pneumococcal Conjugate-13 10/20/2014  . Pneumococcal Polysaccharide-23 12/29/2011  . Td 01/19/2010   Screening Tests Health Maintenance  Topic Date Due  . HEMOGLOBIN A1C  09/25/2017  . FOOT EXAM  12/29/2017  . OPHTHALMOLOGY EXAM  02/08/2018  . TETANUS/TDAP  01/20/2020  . COLONOSCOPY   12/16/2020  . INFLUENZA VACCINE  Completed  . Hepatitis C Screening  Completed  . PNA vac Low Risk Adult  Completed      Plan:     I have personally reviewed and addressed the Medicare Annual Wellness questionnaire and have noted the following in the patient's chart:  A. Medical and social history B. Use of alcohol, tobacco or illicit drugs  C. Current medications and supplements D. Functional ability and status E.  Nutritional status F.  Physical activity G. Advance directives H. List of other physicians I.  Hospitalizations, surgeries, and ER visits in previous 12 months J.  [place] to include hearing, vision, cognitive, depression L. Referrals and appointments - none  In addition, I have reviewed and discussed with patient certain preventive protocols, quality metrics, and best practice recommendations. A written personalized care plan for preventive services as well as general preventive health recommendations were provided to patient.  See attached scanned questionnaire for additional information.   Signed,   Lindell Noe, MHA, BS, LPN Health Coach

## 2017-03-31 NOTE — Progress Notes (Signed)
PCP notes:   Health maintenance:  Flu vaccine - administered  Abnormal screenings:   Mini-Cog score: 19/20  Patient concerns:   Bilateral shoulder pain - Onset approx. 3 mths ago; pain scale: 3/10  Nurse concerns:  None  Next PCP appt:   03/31/17 @ 1530  I reviewed health advisor's note, was available for consultation on the day of service listed in this note, and agree with documentation and plan. Crawford Givens, MD.

## 2017-03-31 NOTE — Progress Notes (Signed)
Pre visit review using our clinic review tool, if applicable. No additional management support is needed unless otherwise documented below in the visit note. 

## 2017-03-31 NOTE — Patient Instructions (Signed)
[name] , Thank you for taking time to come for your Medicare Wellness Visit. I appreciate your ongoing commitment to your health goals. Please review the following plan we discussed and let me know if I can assist you in the future.   These are the goals we discussed: Goals    .  A1C within normal range          Starting 03/31/2017, I will continue to monitor intake of simple carbohydrates by focusing on eating lower glycemic foods and drinking diet soda.        This is a list of the screening recommended for you and due dates:  Health Maintenance  Topic Date Due  . Hemoglobin A1C  09/25/2017  . Complete foot exam   12/29/2017  . Eye exam for diabetics  02/08/2018  . Tetanus Vaccine  01/20/2020  . Colon Cancer Screening  12/16/2020  . Flu Shot  Completed  .  Hepatitis C: One time screening is recommended by Center for Disease Control  (CDC) for  adults born from [birth year] through 1966.   Completed  . Pneumonia vaccines  Completed   Preventive Care for Adults  A healthy lifestyle and preventive care can promote health and wellness. Preventive health guidelines for adults include the following key practices.  . A routine yearly physical is a good way to check with your health care provider about your health and preventive screening. It is a chance to share any concerns and updates on your health and to receive a thorough exam.  . Visit your dentist for a routine exam and preventive care every 6 months. Brush your teeth twice a day and floss once a day. Good oral hygiene prevents tooth decay and gum disease.  . The frequency of eye exams is based on your age, health, family medical history, use  of contact lenses, and other factors. Follow your health care provider's ecommendations for frequency of eye exams.  . Eat a healthy diet. Foods like vegetables, fruits, whole grains, low-fat dairy products, and lean protein foods contain the nutrients you need without too many calories.  Decrease your intake of foods high in solid fats, added sugars, and salt. Eat the right amount of calories for you. Get information about a proper diet from your health care provider, if necessary.  . Regular physical exercise is one of the most important things you can do for your health. Most adults should get at least 150 minutes of moderate-intensity exercise (any activity that increases your heart rate and causes you to sweat) each week. In addition, most adults need muscle-strengthening exercises on 2 or more days a week.  Silver Sneakers may be a benefit available to you. To determine eligibility, you may visit the website: www.silversneakers.com or contact program at (509) 345-1458 Mon-Fri between 8AM-8PM.   . Maintain a healthy weight. The body mass index (BMI) is a screening tool to identify possible weight problems. It provides an estimate of body fat based on height and weight. Your health care provider can find your BMI and can help you achieve or maintain a healthy weight.   For adults 20 years and older: ? A BMI below 18.5 is considered underweight. ? A BMI of 18.5 to 24.9 is normal. ? A BMI of 25 to 29.9 is considered overweight. ? A BMI of 30 and above is considered obese.   . Maintain normal blood lipids and cholesterol levels by exercising and minimizing your intake of saturated fat. Eat a balanced diet  with plenty of fruit and vegetables. Blood tests for lipids and cholesterol should begin at age 65 and be repeated every 5 years. If your lipid or cholesterol levels are high, you are over 50, or you are at high risk for heart disease, you may need your cholesterol levels checked more frequently. Ongoing high lipid and cholesterol levels should be treated with medicines if diet and exercise are not working.  . If you smoke, find out from your health care provider how to quit. If you do not use tobacco, please do not start.  . If you choose to drink alcohol, please do not consume  more than 2 drinks per day. One drink is considered to be 12 ounces (355 mL) of beer, 5 ounces (148 mL) of wine, or 1.5 ounces (44 mL) of liquor.  . If you are 101-35 years old, ask your health care provider if you should take aspirin to prevent strokes.  . Use sunscreen. Apply sunscreen liberally and repeatedly throughout the day. You should seek shade when your shadow is shorter than you. Protect yourself by wearing long sleeves, pants, a wide-brimmed hat, and sunglasses year round, whenever you are outdoors.  . Once a month, do a whole body skin exam, using a mirror to look at the skin on your back. Tell your health care provider of new moles, moles that have irregular borders, moles that are larger than a pencil eraser, or moles that have changed in shape or color.

## 2017-04-02 NOTE — Assessment & Plan Note (Signed)
8mm pinkish lesion on the L forearm. Flat, well-demarcated border, no ulceration. No change from prev.  He declined tx at this point- offered derm referral vs liq N2 tx.  he declined both. we can observe for now.

## 2017-04-02 NOTE — Assessment & Plan Note (Signed)
Check plain films today. Likely rotator cuff pathology. See notes on imaging. Okay for outpatient follow-up.

## 2017-04-02 NOTE — Assessment & Plan Note (Signed)
Per Vascular surgery. I will defer. Discussed with patient. He agrees.

## 2017-04-02 NOTE — Assessment & Plan Note (Signed)
Reasonable control. Not lightheaded. No chest pain. Continue as is.

## 2017-04-02 NOTE — Assessment & Plan Note (Signed)
Advance directive- wife designated if patient were incapacitated.  

## 2017-04-02 NOTE — Assessment & Plan Note (Signed)
Aches are not likely from statin use. More likely to be related to rotator cuff tendinopathy. Continue statin for now.

## 2017-04-02 NOTE — Assessment & Plan Note (Signed)
A1c not at goal but better then prev high level.  Diet and exercise d/w pt.  Weight is up some.  Continue work on diet and exercise as tolerated. Admittedly exercise is limited. Recheck periodically. He agrees.

## 2017-04-10 ENCOUNTER — Ambulatory Visit: Payer: Medicare Other | Admitting: Family Medicine

## 2017-04-20 ENCOUNTER — Encounter: Payer: Self-pay | Admitting: Family Medicine

## 2017-04-20 ENCOUNTER — Ambulatory Visit (INDEPENDENT_AMBULATORY_CARE_PROVIDER_SITE_OTHER): Payer: Medicare Other | Admitting: Family Medicine

## 2017-04-20 VITALS — BP 100/60 | HR 60 | Temp 98.3°F | Ht 67.5 in | Wt 207.5 lb

## 2017-04-20 DIAGNOSIS — M542 Cervicalgia: Secondary | ICD-10-CM

## 2017-04-20 DIAGNOSIS — I208 Other forms of angina pectoris: Secondary | ICD-10-CM | POA: Diagnosis not present

## 2017-04-20 DIAGNOSIS — M7582 Other shoulder lesions, left shoulder: Secondary | ICD-10-CM

## 2017-04-20 DIAGNOSIS — M7552 Bursitis of left shoulder: Secondary | ICD-10-CM | POA: Diagnosis not present

## 2017-04-20 NOTE — Progress Notes (Signed)
Dr. Frederico Hamman T. Kassy Mcenroe, MD, [facility] [place], [address] Phone: [phone] Fax: [phone]  04/20/2017  Patient: [name], MRN: [id number], DOB: Jan 26, 1946, 71 y.o.  Primary Physician:  Tonia Ghent, MD   Chief Complaint  Patient presents with  . Shoulder Pain    Bilateral  . Neck Pain   Subjective:   This 71 y.o. male patient noted above presents with L shoulder pain that has been ongoing for 3-4 mos. there is no history of trauma or accident recently The patient denies neck pain or radicular symptoms. Denies dislocation, subluxation, separation of the shoulder. The patient does complain of pain in the overhead plane with significant painful arc of motion. IROM is painful.  Bilateral shoulder and neck pain.  Anterior shoulder - t-shirt distribution. Started about 3-4 months.   Medications Tried: Tylenol, NSAIDS Ice or Heat: minimally helpful Tried PT: No  Prior shoulder Injury: No Prior surgery: No Prior fracture: clavicle 60 yrs ago  The PMH, PSH, Social History, Family History, Medications, and allergies have been reviewed in [facility], and have been updated if relevant.  Patient Active Problem List   Diagnosis Date Noted  . Shoulder pain 12/30/2016  . Skin lesion 12/30/2016  . Unstable angina ([place]) 09/03/2016  . AAA (abdominal aortic aneurysm) without rupture ([place]) 07/13/2016  . Popliteal artery aneurysm ([place]) 07/13/2016  . Carotid artery disease ([place]) 01/30/2015  . NSTEMI (non-ST elevated myocardial infarction) ([place]) 11/16/2014  . Medicare annual wellness visit, initial 10/21/2014  . Advance care planning 10/21/2014  . PAD (peripheral artery disease) ([place]) 09/09/2014  . COPD exacerbation ([place]) 08/21/2014  . DM (diabetes mellitus), type 2 with peripheral vascular complications ([place]) 74/16/3845  . Back pain 02/24/2013  . Cough 01/26/2011  . COPD (chronic obstructive pulmonary disease)  ([facility]) 12/26/2010  . FATIGUE 05/11/2009  . Essential hypertension 05/08/2009  . Hyperlipidemia 06/20/2008  . CAD, NATIVE VESSEL 06/20/2008    Past Medical History:  Diagnosis Date  . CAD (coronary artery disease)    a. 1999: PCI-->LAD 2/2 MI; b. 2005: inf MI s/p PCI/DES x 4 to RCA; c. Myoview in 12/2005: EF 61%, no evidence for ischemia; d. cath 10/2014: occluded mLAD w/ L to L and L to R collats, dRCA 95% s/p PCI/DES 0%, mPDA 70%, LCx mild to mod irregs, procedure complicated by inf ST ele in recovery, repeat cath showed acute dRCA stent thrombosis o/w occluded mRPDA, PTCA dRCA, PCI/DES RPDA, aggrastat x 18 hr  . CKD (chronic kidney disease)   . COPD (chronic obstructive pulmonary disease) ([place])   . DM2 (diabetes mellitus, type 2) ([place])   . H/O hiatal hernia   . Heart attack ([place])   . HTN (hypertension)   . Hypercholesterolemia   . Impaired fasting glucose    Elevated after steroid injection  . Osteoarthritis of hip   . Osteoarthritis, knee   . PAD (peripheral artery disease) (H[facility])    L fem to below the knee popliteal vein bypass graft  . Peri-rectal abscess 12/27/2011  . Tobacco abuse    Prior    Past Surgical History:  Procedure Laterality Date  . CORONARY ANGIOPLASTY    . [place], 2006   Multiple, LAD in 1999, RCA in 2005  . FEMORAL-POPLITEAL BYPASS GRAFT  2016   L fem to below the knee popliteal vein bypass graft  . INCISE AND DRAIN ABCESS  2007   on scrotum  .  INCISION AND DRAINAGE PERIRECTAL ABSCESS  12/28/2011   Procedure: IRRIGATION AND DEBRIDEMENT PERIRECTAL ABSCESS;  Surgeon: Merrie Roof, MD;  Location: [place];  Service: General;  Laterality: N/A;    Social History   Social History  . Marital status: Married    Spouse name: N/A  . Number of children: 2  . Years of education: N/A   Occupational History  . 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 Smoking status: Former Smoker    Packs/day: 1.00     Years: 40.00    Types: Cigarettes    Quit date: 10/21/1998  . Smokeless tobacco: Never Used     Comment: Past smoker, states he will smoke maybe 1 cigarette/ month or so still  . Alcohol use 0.0 oz/week     Comment: Occasional beer on the weekends  . Drug use: No  . Sexual activity: No   Other Topics Concern  . Not on file   Social History Narrative   Lives in [place]   Married 50+ years   2 grown daughters, 7 grandchildren, 4 great grandchildren   Designated Party Release Form signed on 01/19/10 appointing Gerhard Munch.     Family History  Problem Relation Age of Onset  . Heart failure Father        CHF  . CAD Mother   . Diabetes Mother   . Colon cancer Neg Hx   . Prostate cancer Neg Hx     Allergies  Allergen Reactions  . Augmentin [Amoxicillin-Pot Clavulanate] Other (See Comments)    Nausea,vomiting,diarrhea  . Morphine And Related Nausea And Vomiting    Vomiting, GI upset    Medication list reviewed and updated in full in [employer].  GEN: No fevers, chills. Nontoxic. Primarily MSK c/o today. MSK: Detailed in the HPI GI: tolerating PO intake without difficulty Neuro: No numbness, parasthesias, or tingling associated. Otherwise the pertinent positives of the ROS are noted above.   Objective:   Blood pressure 100/60, pulse 60, temperature 98.3 F (36.8 C), temperature source Oral, height 5' 7.5" (1.715 m), weight 207 lb 8 oz (94.1 kg).  GEN: Well-developed,well-nourished,in no acute distress; alert,appropriate and cooperative throughout examination HEENT: Normocephalic and atraumatic without obvious abnormalities. Ears, externally no deformities PULM: Breathing comfortably in no respiratory distress EXT: No clubbing, cyanosis, or edema PSYCH: Normally interactive. Cooperative during the interview. Pleasant. Friendly and conversant. Not anxious or depressed appearing. Normal, full affect.  CERVICAL SPINE EXAM Range of motion: Flexion, extension,  lateral bending, and rotation: approx 30% loss of motion throughout Pain with terminal motion: y Spinous Processes: NT SCM: NT Upper paracervical muscles: ttp diffuse Upper traps: ttp C5-T1 intact, sensation and motor   Shoulder: L Inspection: No muscle wasting or winging Ecchymosis/edema: neg  AC joint, scapula, clavicle: L mild tenderness Spurling's: neg Abduction: full, 5/5 Flexion: full, 5/5 IR, full, lift-off: 5/5, minimal IROM on the L compared to r, approx 45 degrees L ER at neutral: full, 5/5 AC crossover: pos Neer: pos Hawkins: pos Drop Test: neg Empty Can: pos Supraspinatus insertion: mild-mod T Bicipital groove: NT Speed's: neg Yergason's: neg Sulcus sign: neg Scapular dyskinesis: none C5-T1 intact  Neuro: Sensation intact Grip 5/5   Radiology: Dg Shoulder Left  Result Date: 03/31/2017 CLINICAL DATA:  Left shoulder pain EXAM: LEFT SHOULDER - 2+ VIEW COMPARISON:  None. FINDINGS: Mild spurring along the inferior margin of the glenoid fossa. No osteophytes or other signs of advanced DJD  at the left glenohumeral joint space. Left humeral head is normally positioned relative the glenoid fossa. Additional degenerative spurring noted at the left acromioclavicular joint space. No acute or suspicious osseous finding. Soft tissues about the left shoulder are unremarkable. IMPRESSION: 1. No acute findings. 2. Mild spurring along the inferior margin of the glenoid fossa. This may indicate underlying labral injury. 3. Mild DJD at the left [facility] joint. Electronically Signed   By: Franki Cabot M.D.   On: 03/31/2017 20:58     Assessment and Plan:    Rotator cuff tendonitis, left - Plan: Ambulatory referral to Physical Therapy  Subacromial bursitis of left shoulder joint - Plan: Ambulatory referral to Physical Therapy  Cervicalgia - Plan: Ambulatory referral to Physical Therapy  >25 minutes spent in face to face time with patient, >50% spent in counselling or coordination of  care   Reviewed x-rays face to face with him, answered questions.  Neck loss of motion, probable primary shoulder pathology with RTC, subac bursa, ac arthropathy at play.   [place] shoulder protocol.   The patient could benefit from formal PT to assist with scapular stabilization and RTC strengthening.  Declines an injection for now.   He wonders if Lantus could have caused this. I am doubtful, but will mention to his PCP.   Follow-up: 6-8 weeks if not improved.  Orders Placed This Encounter  Procedures  . Ambulatory referral to Physical Therapy    Signed,  Frederico Hamman T. Mikenzi Raysor, MD   Patient's Medications  New Prescriptions   No medications on file  Previous Medications   ALBUTEROL (PROAIR HFA) 108 (90 BASE) MCG/ACT INHALER    Inhale 2 puffs into the lungs 3 (three) times daily as needed (cough).   ASPIRIN 81 MG EC TABLET    Take 1 tablet (81 mg total) by mouth daily.   ATORVASTATIN (LIPITOR) 40 MG TABLET    Take 1 tablet (40 mg total) by mouth daily.   BLOOD GLUCOSE MONITORING SUPPL (RELION CONFIRM GLUCOSE MONITOR) W/DEVICE KIT    Check blood sugar once daily as needed.  Diagnosis:  250.00  Non-insulin dependent   FENOFIBRATE (TRICOR) 145 MG TABLET    Take 1 tablet (145 mg total) by mouth daily.   GLIPIZIDE (GLUCOTROL) 5 MG TABLET    Take 1 tablet (5 mg total) by mouth daily before breakfast.   GLUCOSAMINE-CHONDROITIN 500-400 MG TABLET    Take 1 tablet by mouth 2 (two) times daily.   GLUCOSE BLOOD (RELION CONFIRM/MICRO TEST) TEST STRIP    Use as instructed to test blood sugar once daily as needed.  Diagnosis:  250.00  Non insulin-dependent.   HYDROCHLOROTHIAZIDE (HYDRODIURIL) 25 MG TABLET    TAKE ONE TABLET BY MOUTH ONCE DAILY   INSULIN GLARGINE (LANTUS SOLOSTAR) 100 UNIT/ML SOLOSTAR PEN    Inject up to 35 units per day   INSULIN PEN NEEDLE (PEN NEEDLES) 31G X 6 MM MISC    Use as directed with insulin pen   LISINOPRIL (PRINIVIL,ZESTRIL) 40 MG TABLET    Take 1 tablet (40 mg total)  by mouth daily.   MECLIZINE (ANTIVERT) 25 MG TABLET    Take 25 mg by mouth every 6 (six) hours as needed. For dizziness/vertigo   METFORMIN (GLUCOPHAGE) 500 MG TABLET    TAKE TWO TABLETS BY MOUTH TWICE DAILY WITH  A  MEAL   METOPROLOL TARTRATE (LOPRESSOR) 25 MG TABLET    TAKE 1/2 (ONE-HALF) TABLET BY MOUTH TWICE DAILY   MULTIPLE VITAMINS-MINERALS (PRESERVISION AREDS 2  PO)    Take 1 tablet by mouth 2 (two) times daily.   NITROGLYCERIN (NITROSTAT) 0.4 MG SL TABLET    Place 1 tablet (0.4 mg total) under the tongue every 5 (five) minutes as needed. For chest pain   RELION LANCETS MICRO-THIN 33G MISC    Use to test blood sugar once daily as needed.  Diagnosis:  250.00   Non insulin dependent.   SYMBICORT 160-4.5 MCG/ACT INHALER    INHALE TWO PUFFS BY MOUTH ONCE DAILY IN THE MORNING   TICAGRELOR (BRILINTA) 60 MG TABS TABLET    Take 1 tablet (60 mg total) by mouth 2 (two) times daily.   TRAMADOL (ULTRAM) 50 MG TABLET    Take 1 tablet (50 mg total) by mouth every 6 (six) hours as needed.  Modified Medications   No medications on file  Discontinued Medications   No medications on file

## 2017-04-25 ENCOUNTER — Other Ambulatory Visit: Payer: Self-pay | Admitting: Cardiovascular Disease

## 2017-04-27 ENCOUNTER — Ambulatory Visit: Payer: Medicare Other | Attending: Family Medicine

## 2017-04-27 DIAGNOSIS — M542 Cervicalgia: Secondary | ICD-10-CM

## 2017-04-27 DIAGNOSIS — G8929 Other chronic pain: Secondary | ICD-10-CM | POA: Diagnosis not present

## 2017-04-27 DIAGNOSIS — M25512 Pain in left shoulder: Secondary | ICD-10-CM | POA: Insufficient documentation

## 2017-04-27 DIAGNOSIS — M25511 Pain in right shoulder: Secondary | ICD-10-CM | POA: Insufficient documentation

## 2017-04-27 NOTE — Therapy (Signed)
[facility] MAIN [facility] SERVICES [address] [place], [place], [address] Phone: [phone]   Fax:  [phone]  Physical Therapy Evaluation  Patient Details  Name: [name] MRN: [id number] Date of Birth: [birth year]   Referring Provider: Kerin Perna MD  Encounter Date: 72/18/2018      PT End of Session - 04/27/17 0954    Visit Number 1   Number of Visits 8   Date for PT Re-Evaluation 06/22/17   Authorization - Visit Number 1   Authorization - Number of Visits 10   PT Start Time 0801   PT Stop Time 0900   PT Time Calculation (min) 59 min   Activity Tolerance Patient tolerated treatment well;Patient limited by pain   Behavior During Therapy [facility] for tasks assessed/performed      Past Medical History:  Diagnosis Date  . CAD (coronary artery disease)    a. 1999: PCI-->LAD 2/2 MI; b. 2005: inf MI s/p PCI/DES x 4 to RCA; c. Myoview in 12/2005: EF 61%, no evidence for ischemia; d. cath 10/2014: occluded mLAD w/ L to L and L to R collats, dRCA 95% s/p PCI/DES 0%, mPDA 70%, LCx mild to mod irregs, procedure complicated by inf ST ele in recovery, repeat cath showed acute dRCA stent thrombosis o/w occluded mRPDA, PTCA dRCA, PCI/DES RPDA, aggrastat x 18 hr  . CKD (chronic kidney disease)   . COPD (chronic obstructive pulmonary disease) (HCC)   . DM2 (diabetes mellitus, type 2) (HCC)   . H/O hiatal hernia   . Heart attack (HCC)   . HTN (hypertension)   . Hypercholesterolemia   . Impaired fasting glucose    Elevated after steroid injection  . Osteoarthritis of hip   . Osteoarthritis, knee   . PAD (peripheral artery disease) (HCC)    L fem to below the knee popliteal vein bypass graft  . Peri-rectal abscess 12/27/2011  . Tobacco abuse    Prior    Past Surgical History:  Procedure Laterality Date  . CORONARY ANGIOPLASTY    . CORONARY STENT PLACEMENT  1999, 2006   Multiple, LAD in 1999, RCA in 2005  . FEMORAL-POPLITEAL BYPASS GRAFT   2016   L fem to below the knee popliteal vein bypass graft  . INCISE AND DRAIN ABCESS  2007   on scrotum  . INCISION AND DRAINAGE PERIRECTAL ABSCESS  12/28/2011   Procedure: IRRIGATION AND DEBRIDEMENT PERIRECTAL ABSCESS;  Surgeon: Robyne Askew, MD;  Location: [facility] OR;  Service: General;  Laterality: N/A;    There were no vitals filed for this visit.        [facility] PT Assessment - 04/27/17 0001      Assessment   Medical Diagnosis rotator cuff tendinitis, bursitis    Referring Provider Kerin Perna MD   Onset Date/Surgical Date 12/26/16   Hand Dominance Right   Next MD Visit pt. not sure   Prior Therapy no     Precautions   Precautions None     Restrictions   Weight Bearing Restrictions No     Balance Screen   Has the patient fallen in the past 6 months No   Has the patient had a decrease in activity level because of a fear of falling?  Yes   Is the patient reluctant to leave their home because of a fear of falling?  No     Home Tourist information centre manager residence   Living Arrangements Spouse/significant other   Available  Help at Discharge Family   Type of Home House   Home Access Stairs to enter   Entrance Stairs-Number of Steps 10-12   Entrance Stairs-Rails Right;Left   Home Layout One level   Home Equipment Walker - standard;Cane - single point;Grab bars - toilet;Shower seat - built in     Prior Function   Level of Independence Independent   Vocation Full time employment   Insurance claims handler making metal parts   Leisure mow the lawn, play golf,      Cognition   Overall Cognitive Status Within Functional Limits for tasks assessed     Sensation   Light Touch Appears Intact     Coordination   Gross Motor Movements are Fluid and Coordinated No     Posture/Postural Control   Posture/Postural Control Postural limitations   Postural Limitations Forward head;Rounded Shoulders      PAIN: Worst pain: 7/10 pain Average pain:  4-5/10 Best pain: 0/10 Right now 7/10 L shoulder, 2-3 R shoulder  POSTURE: Forward head rounded shoulders Guarding of bilateral shoulders  PROM/AROM: Seated AROM Right Left  Shoulder Flexion 144 painful 142 pain  Shoulder Abduction 165 painful 145 pain  ER 1 degree 2 degrees  IR 70 pain 55 degrees    Supine AROM Right Left  Shoulder Flexion 153 pain 135  Shoulder Abduction 140 pain 154  ER 36 31  IR 32 21   Supine PROM Right Left  Shoulder Flexion 154 pain 140  Shoulder Abduction 146 pain 160  ER 42 38  IR 25 25       Right Left  Flexion 35  Extension 30  Side Bending 21 27  Rotation 40 39 PAIN      STRENGTH:  Graded on a 0-5 scale Muscle Group Left Right  Shoulder flex 2+/5 pain 2+/5   Shoulder Abd 2+/5 pain 2+/5 pain  Shoulder Ext 2+/5 pain 2+/5 pain  Shoulder IR/ER    Elbow 4/5 4/5  Wrist/hand 4+/5 4+/5    Mobilization:  Difficult to assess end feel due to patient guarding: AP, PA, Inferior  SENSATION: functional  SPECIAL TESTS:  Spurlings + AC crossover - Neer: + Hawkins: + Drop Test: - Empty Can: + Speed's: - Yergason's: - Sulcus sign: - Scapular dyskinesis: none   FUNCTIONAL MOBILITY: Limited in all overhead and behind back movements due to pain   OUTCOME MEASURES: TEST Outcome Interpretation  QuickDash 43.2%   QuickDash work 31.3%   QuickDash sport 43.8%   NDI 28%             Treat:  Bilateral shoulder isometrics: ab, ad, ER, IR, Flex, Ext  Scapular retractions  Cervical side bending with towel          Objective measurements completed on examination: See above findings.                  PT Education - 04/27/17 0954    Education provided Yes   Education Details body biomechanics, HEP, shoulder anatomy   Person(s) Educated Patient   Methods Explanation;Demonstration;Handout;Verbal cues   Comprehension Verbalized understanding;Returned demonstration          PT Short Term Goals - 04/27/17  1003      PT SHORT TERM GOAL #1   Title Patient will be independent in home exercise program to improve strength/mobility for better functional independence with ADLs.   Baseline HEP given   Time 2   Period Weeks   Status New   Target  Date 05/11/17     PT SHORT TERM GOAL #2   Title Patient will report a worst pain of 5/10 on VAS in bilateral shoulders      to improve tolerance with ADLs and reduced symptoms with activities.    Baseline 10/18: 7/10 worst pain   Time 2   Period Weeks   Status New   Target Date 05/11/17           PT Long Term Goals - 04/27/17 1004      PT LONG TERM GOAL #1   Title Patient will report a worst pain of 3/10 on VAS in bilateral shoulders  to improve tolerance with ADLs and reduced symptoms with activities.    Baseline 10/18: worst pain 7/10   Time 8   Period Weeks   Status New   Target Date 06/22/17     PT LONG TERM GOAL #2   Title Patient will reduce modified Oswestry score to <20 as to demonstrate minimal disability with ADLs including improved sleeping tolerance, walking/sitting tolerance etc for better mobility with ADLs.    Baseline 10/18: 43.2% general, QuickDash Work 31.3%, Quickdash sport=43.8%   Time 8   Period Weeks   Status New   Target Date 06/22/17     PT LONG TERM GOAL #3   Title Patient will improve shoulder external rotation in order to maintain hair care pain-free.   Baseline ER limited bilaterally limiting patient's ability to perform ADLs without pain   Time 8   Period Weeks   Status New   Target Date 06/22/17     PT LONG TERM GOAL #4   Title Patient will return to playing golf to allow for return to previous level of function.    Baseline Patient unable to play golf at this time due to pain.    Time 8   Period Weeks   Status New   Target Date 06/22/17                Plan - 04/27/17 0955    Clinical Impression Statement Patient is a pleasant 71 year old male who presents with bilateral shoulder pain and  cervicalgia. Poor strength of bilateral rotator cuffs in additional to impingement related symptoms limits patient's ability to perform overhead movements at this time. QuickDash=43.2%, Quickdash work=31.3%, Quickdash sport =43.8%, NDI=28%. Cervical ROM limited in muscular patterning indicating potential tight cervical and upper trap musculature at this time. Patient will benefit from skilled physical therapy services to decrease pain and increase mobility in neck and shoulders to allow for return to previous level of function.    History and Personal Factors relevant to plan of care: This patient presents with  1- 2, personal factors/ comorbidities and 3 ,  body elements including body structures and functions, activity limitations and or participation restrictions. Patient's condition is stable.    Clinical Presentation Stable   Clinical Presentation due to: chronicity over the past 4 months   Clinical Decision Making Low   Rehab Potential Good   Clinical Impairments Affecting Rehab Potential (-) chronicity of pain, age, hx of COPD, angina, AAA, popliteal artery aneurysm, CAD, NSTEMI, PAD, DM,  (+) family support, willingness for followthrough   PT Frequency 1x / week   PT Duration 8 weeks   PT Treatment/Interventions ADLs/Self Care Home Management;Cryotherapy;Electrical Stimulation;Iontophoresis 4mg /ml Dexamethasone;Moist Heat;Traction;Ultrasound;Therapeutic exercise;Therapeutic activities;Neuromuscular re-education;Patient/family education;Passive range of motion;Manual techniques;Dry needling;Energy conservation;Taping;Visual/perceptual remediation/compensation   PT Next Visit Plan rtc and scap stabilization, [place] neck protocol per doctor order  PT Home Exercise Plan see sheet   Consulted and Agree with Plan of Care Patient      Patient will benefit from skilled therapeutic intervention in order to improve the following deficits and impairments:  Decreased activity tolerance, Decreased  coordination, Decreased range of motion, Decreased mobility, Decreased strength, Hypomobility, Impaired flexibility, Impaired perceived functional ability, Increased fascial restricitons, Impaired UE functional use, Postural dysfunction, Improper body mechanics, Pain  Visit Diagnosis: Chronic left shoulder pain  Chronic right shoulder pain  Cervicalgia      G-Codes - 04/27/17 1011    Functional Assessment Tool Used (Outpatient Only) QuickDash, NDI, ROM, MMT, clinical judgement   Functional Limitation Carrying, moving and handling objects   Carrying, Moving and Handling Objects Current Status [phone]([id number]) At least 40 percent but less than 60 percent impaired, limited or restricted   Carrying, Moving and Handling Objects Goal Status ([id number]) At least 20 percent but less than 40 percent impaired, limited or restricted       Problem List Patient Active Problem List   Diagnosis Date Noted  . Shoulder pain 12/30/2016  . Skin lesion 12/30/2016  . Unstable angina (HCC) 09/03/2016  . AAA (abdominal aortic aneurysm) without rupture (HCC) 07/13/2016  . Popliteal artery aneurysm (HCC) 07/13/2016  . Carotid artery disease (HCC) 01/30/2015  . NSTEMI (non-ST elevated myocardial infarction) (HCC) 11/16/2014  . Medicare annual wellness visit, initial 10/21/2014  . Advance care planning 10/21/2014  . PAD (peripheral artery disease) (HCC) 09/09/2014  . COPD exacerbation (HCC) 08/21/2014  . DM (diabetes mellitus), type 2 with peripheral vascular complications (HCC) 05/24/2013  . Back pain 02/24/2013  . Cough 01/26/2011  . COPD (chronic obstructive pulmonary disease) (HCC) 12/26/2010  . FATIGUE 05/11/2009  . Essential hypertension 05/08/2009  . Hyperlipidemia 06/20/2008  . CAD, NATIVE VESSEL 06/20/2008   Precious BardMarina Sheikh Leverich, PT, DPT   Precious BardMarina Darcus Edds 04/27/2017, 10:12 AM  [facility] [address] [place], [place], [id number] Phone: [phone]    Fax:  [phone]  Name: Ermalene PostinVance K Gootee MRN: [id number] Date of Birth: 03/06/1946

## 2017-05-02 ENCOUNTER — Ambulatory Visit: Payer: Medicare Other

## 2017-05-04 ENCOUNTER — Ambulatory Visit: Payer: Medicare Other

## 2017-05-04 DIAGNOSIS — G8929 Other chronic pain: Secondary | ICD-10-CM | POA: Diagnosis not present

## 2017-05-04 DIAGNOSIS — M25512 Pain in left shoulder: Principal | ICD-10-CM

## 2017-05-04 DIAGNOSIS — M25511 Pain in right shoulder: Secondary | ICD-10-CM | POA: Diagnosis not present

## 2017-05-04 DIAGNOSIS — M542 Cervicalgia: Secondary | ICD-10-CM

## 2017-05-04 NOTE — Therapy (Signed)
[facility] [address] [place], [place], [address] Phone: [phone]   Fax:  [phone]  Physical Therapy Treatment  Patient Details  Name: Brett PostinVance K May MRN: [id number] Date of Birth: 08/01/1945 Referring Provider: Kerin Pernaopeland, Spencer MD  Encounter Date: 05/04/2017      PT End of Session - 05/04/17 0805    Visit Number 2   Number of Visits 8   Date for PT Re-Evaluation 06/22/17   Authorization - Visit Number 2   Authorization - Number of Visits 10   PT Start Time 0759   PT Stop Time 0845   PT Time Calculation (min) 46 min   Activity Tolerance Patient tolerated treatment well;Patient limited by pain   Behavior During Therapy [facility] for tasks assessed/performed      Past Medical History:  Diagnosis Date  . CAD (coronary artery disease)    a. 1999: PCI-->LAD 2/2 MI; b. 2005: inf MI s/p PCI/DES x 4 to RCA; c. Myoview in 12/2005: EF 61%, no evidence for ischemia; d. cath 10/2014: occluded mLAD w/ L to L and L to R collats, dRCA 95% s/p PCI/DES 0%, mPDA 70%, LCx mild to mod irregs, procedure complicated by inf ST ele in recovery, repeat cath showed acute dRCA stent thrombosis o/w occluded mRPDA, PTCA dRCA, PCI/DES RPDA, aggrastat x 18 hr  . CKD (chronic kidney disease)   . COPD (chronic obstructive pulmonary disease) (HCC)   . DM2 (diabetes mellitus, type 2) (HCC)   . H/O hiatal hernia   . Heart attack (HCC)   . HTN (hypertension)   . Hypercholesterolemia   . Impaired fasting glucose    Elevated after steroid injection  . Osteoarthritis of hip   . Osteoarthritis, knee   . PAD (peripheral artery disease) (HCC)    L fem to below the knee popliteal vein bypass graft  . Peri-rectal abscess 12/27/2011  . Tobacco abuse    Prior    Past Surgical History:  Procedure Laterality Date  . CORONARY ANGIOPLASTY    . CORONARY STENT PLACEMENT  1999, 2006   Multiple, LAD in 1999, RCA in 2005  . FEMORAL-POPLITEAL BYPASS GRAFT   2016   L fem to below the knee popliteal vein bypass graft  . INCISE AND DRAIN ABCESS  2007   on scrotum  . INCISION AND DRAINAGE PERIRECTAL ABSCESS  12/28/2011   Procedure: IRRIGATION AND DEBRIDEMENT PERIRECTAL ABSCESS;  Surgeon: Robyne AskewPaul S Toth III, MD;  Location: [facility] OR;  Service: General;  Laterality: N/A;    There were no vitals filed for this visit.     TherEx     Subjective Assessment - 05/04/17 0804    Subjective Patient compliant with HEP. Tuesday was the worst day for pain with L shoulder.    Pertinent History Patient is a pleasant 71 year old male who presents with rotator cuff tendonitis and subacromial bursitis with cervicalgia. Left shoulder is worse, right shoulder is also affected. Pain began around 3-4 months ago around the time he started taking insulin. Incrementally got worse, no incident occurring to start pain. Some days worse than the other. 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 Is not able to golf at this time due to pain.    Limitations Lifting;Writing;House hold activities   How long can you sit comfortably? N/A   How long can you stand comfortably? N/A   How long can you walk comfortably? N/A   Diagnostic tests  x ray, MRI   Patient Stated Goals be able to sleep better, control pain better,    Currently in Pain? Yes   Pain Score 3    Pain Location Shoulder   Pain Orientation Right;Left   Pain Descriptors / Indicators Aching   Pain Type Chronic pain   Pain Onset More than a month ago   Pain Frequency Intermittent      Bilateral shoulder isometrics: ab, ad, ER, IR, Flex, Ext, bicep, tricep 10x 5 second holds   Scapular retractions 15x    Cervical side bending with towel  L shoulder stabilizations with pertubation's 3x 60 seconds  Scapular punches RUE 15x   Manual: Distraction 8x 15 second holds in varying planes  Rhythmic rotation to decrease tenseness bilaterally  STM to upper trap bilaterally, decreased  pain                           PT Education - 05/04/17 0805    Education provided Yes   Education Details body mechanics, shoulder strengthening   Person(s) Educated Patient   Methods Demonstration;Explanation;Verbal cues   Comprehension Verbalized understanding;Returned demonstration          PT Short Term Goals - 04/27/17 1003      PT SHORT TERM GOAL #1   Title Patient will be independent in home exercise program to improve strength/mobility for better functional independence with ADLs.   Baseline HEP given   Time 2   Period Weeks   Status New   Target Date 05/11/17     PT SHORT TERM GOAL #2   Title Patient will report a worst pain of 5/10 on VAS in bilateral shoulders      to improve tolerance with ADLs and reduced symptoms with activities.    Baseline 10/18: 7/10 worst pain   Time 2   Period Weeks   Status New   Target Date 05/11/17           PT Long Term Goals - 04/27/17 1004      PT LONG TERM GOAL #1   Title Patient will report a worst pain of 3/10 on VAS in bilateral shoulders  to improve tolerance with ADLs and reduced symptoms with activities.    Baseline 10/18: worst pain 7/10   Time 8   Period Weeks   Status New   Target Date 06/22/17     PT LONG TERM GOAL #2   Title Patient will reduce modified Oswestry score to <20 as to demonstrate minimal disability with ADLs including improved sleeping tolerance, walking/sitting tolerance etc for better mobility with ADLs.    Baseline 10/18: 43.2% general, QuickDash Work 31.3%, Quickdash sport=43.8%   Time 8   Period Weeks   Status New   Target Date 06/22/17     PT LONG TERM GOAL #3   Title Patient will improve shoulder external rotation in order to maintain hair care pain-free.   Baseline ER limited bilaterally limiting patient's ability to perform ADLs without pain   Time 8   Period Weeks   Status New   Target Date 06/22/17     PT LONG TERM GOAL #4   Title Patient will return to  playing golf to allow for return to previous level of function.    Baseline Patient unable to play golf at this time due to pain.    Time 8   Period Weeks   Status New   Target Date 06/22/17  Plan - 05/04/17 0849    Clinical Impression Statement Isometrics painful on LUE and terminated. Supine position increases patient static pain levels while sitting decreases it. Recommend incline position for future therapy sessions. Patient will benefit from continued skilled physical therapy to improve mobility deficits and pain control for improved quality of life.    Rehab Potential Good   Clinical Impairments Affecting Rehab Potential (-) chronicity of pain, age, hx of COPD, angina, AAA, popliteal artery aneurysm, CAD, NSTEMI, PAD, DM,  (+) family support, willingness for followthrough   PT Frequency 1x / week   PT Duration 8 weeks   PT Treatment/Interventions ADLs/Self Care Home Management;Cryotherapy;Electrical Stimulation;Iontophoresis 4mg /ml Dexamethasone;Moist Heat;Traction;Ultrasound;Therapeutic exercise;Therapeutic activities;Neuromuscular re-education;Patient/family education;Passive range of motion;Manual techniques;Dry needling;Energy conservation;Taping;Visual/perceptual remediation/compensation   PT Next Visit Plan rtc and scap stabilization, Mackenzie neck protocol per doctor order, sitting up or incline position   PT Home Exercise Plan see sheet   Consulted and Agree with Plan of Care Patient      Patient will benefit from skilled therapeutic intervention in order to improve the following deficits and impairments:  Decreased activity tolerance, Decreased coordination, Decreased range of motion, Decreased mobility, Decreased strength, Hypomobility, Impaired flexibility, Impaired perceived functional ability, Increased fascial restricitons, Impaired UE functional use, Postural dysfunction, Improper body mechanics, Pain  Visit Diagnosis: Chronic left shoulder  pain  Chronic right shoulder pain  Cervicalgia     Problem List Patient Active Problem List   Diagnosis Date Noted  . Shoulder pain 12/30/2016  . Skin lesion 12/30/2016  . Unstable angina (HCC) 09/03/2016  . AAA (abdominal aortic aneurysm) without rupture (HCC) 07/13/2016  . Popliteal artery aneurysm (HCC) 07/13/2016  . Carotid artery disease (HCC) 01/30/2015  . NSTEMI (non-ST elevated myocardial infarction) (HCC) 11/16/2014  . Medicare annual wellness visit, initial 10/21/2014  . Advance care planning 10/21/2014  . PAD (peripheral artery disease) (HCC) 09/09/2014  . COPD exacerbation (HCC) 08/21/2014  . DM (diabetes mellitus), type 2 with peripheral vascular complications (HCC) 05/24/2013  . Back pain 02/24/2013  . Cough 01/26/2011  . COPD (chronic obstructive pulmonary disease) (HCC) 12/26/2010  . FATIGUE 05/11/2009  . Essential hypertension 05/08/2009  . Hyperlipidemia 06/20/2008  . CAD, NATIVE VESSEL 06/20/2008   Precious Bard, PT, DPT   Precious Bard 05/04/2017, 8:50 AM  [facility] MAIN [facility] SERVICES [address] [place], [place], [address] Phone: [phone]   Fax:  [phone]  Name: [name] MRN: [id number] Date of Birth: 05/01/1946

## 2017-05-10 ENCOUNTER — Ambulatory Visit: Payer: Medicare Other

## 2017-05-15 ENCOUNTER — Other Ambulatory Visit: Payer: Self-pay | Admitting: Cardiovascular Disease

## 2017-05-15 ENCOUNTER — Other Ambulatory Visit: Payer: Self-pay | Admitting: Family Medicine

## 2017-05-18 ENCOUNTER — Ambulatory Visit: Payer: Medicare Other | Attending: Family Medicine

## 2017-05-18 DIAGNOSIS — M542 Cervicalgia: Secondary | ICD-10-CM | POA: Insufficient documentation

## 2017-05-18 DIAGNOSIS — M25512 Pain in left shoulder: Secondary | ICD-10-CM | POA: Diagnosis not present

## 2017-05-18 DIAGNOSIS — G8929 Other chronic pain: Secondary | ICD-10-CM | POA: Insufficient documentation

## 2017-05-18 DIAGNOSIS — M25511 Pain in right shoulder: Secondary | ICD-10-CM | POA: Diagnosis not present

## 2017-05-18 NOTE — Therapy (Signed)
[facility] MAIN [facility] SERVICES [address] [place], [place], [address] Phone: [phone]   Fax:  [phone]  Physical Therapy Treatment  Patient Details  Name: [name] MRN: [id number] Date of Birth: 03-Oct-1945 Referring Provider: Kerin Perna MD   Encounter Date: 05/18/2017  PT End of Session - 05/18/17 0754    Visit Number  3    Number of Visits  8    Date for PT Re-Evaluation  06/22/17    Authorization - Visit Number  3    Authorization - Number of Visits  10    PT Start Time  0800    PT Stop Time  0845    PT Time Calculation (min)  45 min    Activity Tolerance  Patient tolerated treatment well;Patient limited by pain    Behavior During Therapy  [facility] for tasks assessed/performed       Past Medical History:  Diagnosis Date  . CAD (coronary artery disease)    a. 1999: PCI-->LAD 2/2 MI; b. 2005: inf MI s/p PCI/DES x 4 to RCA; c. Myoview in 12/2005: EF 61%, no evidence for ischemia; d. cath 10/2014: occluded mLAD w/ L to L and L to R collats, dRCA 95% s/p PCI/DES 0%, mPDA 70%, LCx mild to mod irregs, procedure complicated by inf ST ele in recovery, repeat cath showed acute dRCA stent thrombosis o/w occluded mRPDA, PTCA dRCA, PCI/DES RPDA, aggrastat x 18 hr  . CKD (chronic kidney disease)   . COPD (chronic obstructive pulmonary disease) (HCC)   . DM2 (diabetes mellitus, type 2) (HCC)   . H/O hiatal hernia   . Heart attack (HCC)   . HTN (hypertension)   . Hypercholesterolemia   . Impaired fasting glucose    Elevated after steroid injection  . Osteoarthritis of hip   . Osteoarthritis, knee   . PAD (peripheral artery disease) (HCC)    L fem to below the knee popliteal vein bypass graft  . Peri-rectal abscess 12/27/2011  . Tobacco abuse    Prior    Past Surgical History:  Procedure Laterality Date  . CORONARY ANGIOPLASTY    . CORONARY STENT PLACEMENT  1999, 2006   Multiple, LAD in 1999, RCA in 2005  . FEMORAL-POPLITEAL  BYPASS GRAFT  2016   L fem to below the knee popliteal vein bypass graft  . INCISE AND DRAIN ABCESS  2007   on scrotum    There were no vitals filed for this visit.  Subjective Assessment - 05/18/17 0803    Subjective  Patient has good days and bad days, with pain coming and going, averaging 3-4 a day, worse in the morning. Neck pain is still prevalent,     Pertinent History  Patient is a pleasant 71 year old male who presents with rotator cuff tendonitis and subacromial bursitis with cervicalgia. Left shoulder is worse, right shoulder is also affected. Pain began around 3-4 months ago around the time he started taking insulin. Incrementally got worse, no incident occurring to start pain. Some days worse than the other. 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 Is not able to golf at this time due to pain.     Limitations  Lifting;Writing;House hold activities    How long can you sit comfortably?  N/A    How long can you stand comfortably?  N/A    How long can you walk comfortably?  N/A    Diagnostic tests  x  ray, MRI    Patient Stated Goals  be able to sleep better, control pain better,     Currently in Pain?  Yes    Pain Score  3     Pain Location  Shoulder    Pain Orientation  Right;Left    Pain Descriptors / Indicators  Aching    Pain Type  Chronic pain    Pain Radiating Towards  deltoid    Pain Onset  More than a month ago    Pain Score  4    Pain Location  Neck    Pain Orientation  Posterior    Pain Descriptors / Indicators  Aching;Tightness    Pain Type  Chronic pain      Don't lay supine  TherEx Walk walks: flexion 10x, abduction 10x (performed L and R)  Swiss ball forward rolls 10x, angle L and R 10x each side with 5 second holds   Scapular retractions 15x   Cervical side bending with towel 8x 5 second holds Cervical extension with towel 8x 5 second holds   Isometrics terminated due to excessive pain    Manual: Distraction 8x 15 second  holds in varying planes  Trigger point overpressure at shoulder on L   STM to upper trap bilaterally, decreased pain Cervical stretch with overpressure at shoulder, side bending and rotation 3x 20 seconds     Pt. response to medical necessity:  Patient will continue to benefit from skilled physical therapy to improve mobility deficits and pain control for improved quality of life.                      PT Education - 05/18/17 0754    Education provided  Yes    Education Details  body mechanics for functional strength and postural correction    Person(s) Educated  Patient    Methods  Explanation;Demonstration;Verbal cues    Comprehension  Returned demonstration;Verbalized understanding       PT Short Term Goals - 04/27/17 1003      PT SHORT TERM GOAL #1   Title  Patient will be independent in home exercise program to improve strength/mobility for better functional independence with ADLs.    Baseline  HEP given    Time  2    Period  Weeks    Status  New    Target Date  05/11/17      PT SHORT TERM GOAL #2   Title  Patient will report a worst pain of 5/10 on VAS in bilateral shoulders      to improve tolerance with ADLs and reduced symptoms with activities.     Baseline  10/18: 7/10 worst pain    Time  2    Period  Weeks    Status  New    Target Date  05/11/17        PT Long Term Goals - 04/27/17 1004      PT LONG TERM GOAL #1   Title  Patient will report a worst pain of 3/10 on VAS in bilateral shoulders  to improve tolerance with ADLs and reduced symptoms with activities.     Baseline  10/18: worst pain 7/10    Time  8    Period  Weeks    Status  New    Target Date  06/22/17      PT LONG TERM GOAL #2   Title  Patient will reduce modified Oswestry score to <20 as to demonstrate minimal  disability with ADLs including improved sleeping tolerance, walking/sitting tolerance etc for better mobility with ADLs.     Baseline  10/18: 43.2% general, QuickDash  Work 31.3%, Quickdash sport=43.8%    Time  8    Period  Weeks    Status  New    Target Date  06/22/17      PT LONG TERM GOAL #3   Title  Patient will improve shoulder external rotation in order to maintain hair care pain-free.    Baseline  ER limited bilaterally limiting patient's ability to perform ADLs without pain    Time  8    Period  Weeks    Status  New    Target Date  06/22/17      PT LONG TERM GOAL #4   Title  Patient will return to playing golf to allow for return to previous level of function.     Baseline  Patient unable to play golf at this time due to pain.     Time  8    Period  Weeks    Status  New    Target Date  06/22/17            Plan - 05/18/17 0845    Clinical Impression Statement  Patient challenged by eccentric motion of wall walks due to poor control of rotator muscles, however had no pain increase. Unable to perform isometrics due to excessive pain. Trigger point release in seated position relieved partial tightness of L cervical musculature, not all however due to excessive tightness. Patient will continue to benefit from skilled physical therapy to improve mobility deficits and pain control for improved quality of life.     Rehab Potential  Good    Clinical Impairments Affecting Rehab Potential  (-) chronicity of pain, age, hx of COPD, angina, AAA, popliteal artery aneurysm, CAD, NSTEMI, PAD, DM,  (+) family support, willingness for followthrough    PT Frequency  1x / week    PT Duration  8 weeks    PT Treatment/Interventions  ADLs/Self Care Home Management;Cryotherapy;Electrical Stimulation;Iontophoresis 4mg /ml Dexamethasone;Moist Heat;Traction;Ultrasound;Therapeutic exercise;Therapeutic activities;Neuromuscular re-education;Patient/family education;Passive range of motion;Manual techniques;Dry needling;Energy conservation;Taping;Visual/perceptual remediation/compensation    PT Next Visit Plan  rtc and scap stabilization, Mackenzie neck protocol per  doctor order, sitting up or incline position    PT Home Exercise Plan  see sheet    Consulted and Agree with Plan of Care  Patient       Patient will benefit from skilled therapeutic intervention in order to improve the following deficits and impairments:  Decreased activity tolerance, Decreased coordination, Decreased range of motion, Decreased mobility, Decreased strength, Hypomobility, Impaired flexibility, Impaired perceived functional ability, Increased fascial restricitons, Impaired UE functional use, Postural dysfunction, Improper body mechanics, Pain  Visit Diagnosis: Chronic left shoulder pain  Chronic right shoulder pain  Cervicalgia     Problem List Patient Active Problem List   Diagnosis Date Noted  . Shoulder pain 12/30/2016  . Skin lesion 12/30/2016  . Unstable angina (HCC) 09/03/2016  . AAA (abdominal aortic aneurysm) without rupture (HCC) 07/13/2016  . Popliteal artery aneurysm (HCC) 07/13/2016  . Carotid artery disease (HCC) 01/30/2015  . NSTEMI (non-ST elevated myocardial infarction) (HCC) 11/16/2014  . Medicare annual wellness visit, initial 10/21/2014  . Advance care planning 10/21/2014  . PAD (peripheral artery disease) (HCC) 09/09/2014  . COPD exacerbation (HCC) 08/21/2014  . DM (diabetes mellitus), type 2 with peripheral vascular complications (HCC) 05/24/2013  . Back pain 02/24/2013  . Cough 01/26/2011  .  COPD (chronic obstructive pulmonary disease) (HCC) 12/26/2010  . FATIGUE 05/11/2009  . Essential hypertension 05/08/2009  . Hyperlipidemia 06/20/2008  . CAD, NATIVE VESSEL 06/20/2008   Precious BardMarina Kimber Fritts, PT, DPT    Precious BardMarina Kasie Leccese 05/18/2017, 8:45 AM  [facility] MAIN [facility] [address] [place], [place], [address] Phone: [phone]   Fax:  [phone]  Name: [name] MRN: [id number] Date of Birth: 01/16/1946

## 2017-05-25 ENCOUNTER — Ambulatory Visit: Payer: Medicare Other

## 2017-05-25 DIAGNOSIS — G8929 Other chronic pain: Secondary | ICD-10-CM

## 2017-05-25 DIAGNOSIS — M542 Cervicalgia: Secondary | ICD-10-CM | POA: Diagnosis not present

## 2017-05-25 DIAGNOSIS — M25511 Pain in right shoulder: Secondary | ICD-10-CM | POA: Diagnosis not present

## 2017-05-25 DIAGNOSIS — M25512 Pain in left shoulder: Principal | ICD-10-CM

## 2017-05-25 NOTE — Therapy (Signed)
[place] [facility] MAIN [facility] [address] [place], [place], [address] Phone: [phone]   Fax:  [phone]  Physical Therapy Treatment  Patient Details  Name: Brett PostinVance K Zuba MRN: [id number] Date of Birth: 12/20/1945 Referring Provider: Kerin Pernaopeland, Spencer MD   Encounter Date: 05/25/2017  PT End of Session - 05/25/17 0804    Visit Number  4    Number of Visits  8    Date for PT Re-Evaluation  06/22/17    Authorization - Visit Number  4    Authorization - Number of Visits  10    PT Start Time  0759    PT Stop Time  0845    PT Time Calculation (min)  46 min    Activity Tolerance  Patient tolerated treatment well;Patient limited by pain    Behavior During Therapy  [facility] assessed/performed       Past Medical History:  Diagnosis Date  . CAD (coronary artery disease)    a. 1999: PCI-->LAD 2/2 MI; b. 2005: inf MI s/p PCI/DES x 4 to RCA; c. Myoview in 12/2005: EF 61%, no evidence for ischemia; d. cath 10/2014: occluded mLAD w/ L to L and L to R collats, dRCA 95% s/p PCI/DES 0%, mPDA 70%, LCx mild to mod irregs, procedure complicated by inf ST ele in recovery, repeat cath showed acute dRCA stent thrombosis o/w occluded mRPDA, PTCA dRCA, PCI/DES RPDA, aggrastat x 18 hr  . CKD (chronic kidney disease)   . COPD (chronic obstructive pulmonary disease) (HCC)   . DM2 (diabetes mellitus, type 2) (HCC)   . H/O hiatal hernia   . Heart attack (HCC)   . HTN (hypertension)   . Hypercholesterolemia   . Impaired fasting glucose    Elevated after steroid injection  . Osteoarthritis of hip   . Osteoarthritis, knee   . PAD (peripheral artery disease) (HCC)    L fem to below the knee popliteal vein bypass graft  . Peri-rectal abscess 12/27/2011  . Tobacco abuse    Prior    Past Surgical History:  Procedure Laterality Date  . CORONARY ANGIOPLASTY    . CORONARY STENT PLACEMENT  1999, 2006   Multiple, LAD in 1999, RCA in 2005  . FEMORAL-POPLITEAL  BYPASS GRAFT  2016   L fem to below the knee popliteal vein bypass graft  . INCISE AND DRAIN ABCESS  2007   on scrotum  . INCISION AND DRAINAGE PERIRECTAL ABSCESS  12/28/2011   Procedure: IRRIGATION AND DEBRIDEMENT PERIRECTAL ABSCESS;  Surgeon: Robyne AskewPaul S Toth III, MD;  Location: [facility] OR;  Service: General;  Laterality: N/A;    There were no vitals filed for this visit.  Subjective Assessment - 05/25/17 0802    Subjective  Patient having increase in L shoulder pain from incliment weather, reports R shoulder and neck is feeling better though.     Pertinent History  Patient is a pleasant 71 year old male who presents with rotator cuff tendonitis and subacromial bursitis with cervicalgia. Left shoulder is worse, right shoulder is also affected. Pain began around 3-4 months ago around the time he started taking insulin. Incrementally got worse, no incident occurring to start pain. Some days worse than the other. 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 Is not able to golf at this time due to pain.     Limitations  Lifting;Writing;House hold activities    How long can you sit comfortably?  N/A  How long can you stand comfortably?  N/A    How long can you walk comfortably?  N/A    Diagnostic tests  x ray, MRI    Patient Stated Goals  be able to sleep better, control pain better,     Currently in Pain?  Yes    Pain Score  7     Pain Location  Shoulder    Pain Orientation  Left    Pain Descriptors / Indicators  Aching    Pain Type  Chronic pain    Pain Radiating Towards  deltoid     Pain Onset  More than a month ago    Pain Frequency  Intermittent       TherEx Doorway stretch 3x 30 seconds Sidelying external rotation 2x 10 Rows YTB 2x  15 Walk walks: flexion 10x, abduction 10x (performed L and R)  Swiss ball forward rolls 10x, angle L and R 10x each side with 5 second holds    Scapular retractions 15x   Cervical side bending with towel 8x 5 second holds Cervical  extension with towel 8x 5 second holds     Manual: Scapular inferior posterior mobilizations 3x60 seconds  Scapular med lat mobilizations 3x60 seconds AP and inferior mobilizations 6x 10 seconds grade II Distraction 8x 15 second holds in varying planes  Trigger point overpressure at shoulder on L   STM to upper trap bilaterally, decreased pain Cervical stretch with overpressure at shoulder, side bending and rotation 3x 20 seconds    Pt. response to medical necessity:  Patient will continue to benefit from skilled physical therapy to improve mobility deficits and pain control for improved quality of life.                         PT Education - 05/25/17 0803    Education provided  Yes    Education Details  postural body mechanics    Person(s) Educated  Patient    Methods  Explanation;Demonstration;Verbal cues    Comprehension  Verbalized understanding;Returned demonstration       PT Short Term Goals - 04/27/17 1003      PT SHORT TERM GOAL #1   Title  Patient will be independent in home exercise program to improve strength/mobility for better functional independence with ADLs.    Baseline  HEP given    Time  2    Period  Weeks    Status  New    Target Date  05/11/17      PT SHORT TERM GOAL #2   Title  Patient will report a worst pain of 5/10 on VAS in bilateral shoulders      to improve tolerance with ADLs and reduced symptoms with activities.     Baseline  10/18: 7/10 worst pain    Time  2    Period  Weeks    Status  New    Target Date  05/11/17        PT Long Term Goals - 04/27/17 1004      PT LONG TERM GOAL #1   Title  Patient will report a worst pain of 3/10 on VAS in bilateral shoulders  to improve tolerance with ADLs and reduced symptoms with activities.     Baseline  10/18: worst pain 7/10    Time  8    Period  Weeks    Status  New    Target Date  06/22/17  PT LONG TERM GOAL #2   Title  Patient will reduce modified Oswestry score  to <20 as to demonstrate minimal disability with ADLs including improved sleeping tolerance, walking/sitting tolerance etc for better mobility with ADLs.     Baseline  10/18: 43.2% general, QuickDash Work 31.3%, Quickdash sport=43.8%    Time  8    Period  Weeks    Status  New    Target Date  06/22/17      PT LONG TERM GOAL #3   Title  Patient will improve shoulder external rotation in order to maintain hair care pain-free.    Baseline  ER limited bilaterally limiting patient's ability to perform ADLs without pain    Time  8    Period  Weeks    Status  New    Target Date  06/22/17      PT LONG TERM GOAL #4   Title  Patient will return to playing golf to allow for return to previous level of function.     Baseline  Patient unable to play golf at this time due to pain.     Time  8    Period  Weeks    Status  New    Target Date  06/22/17            Plan - 05/25/17 0845    Clinical Impression Statement  Forward shoulder position limiting L shoulder mobility and ROM. Patient performed postural alignment exercises to posteriorly rotate shoulder with occasional pain.  Patient will continue to benefit from skilled physical therapy to improve mobility deficits and pain control for improved quality of life.     Rehab Potential  Good    Clinical Impairments Affecting Rehab Potential  (-) chronicity of pain, age, hx of COPD, angina, AAA, popliteal artery aneurysm, CAD, NSTEMI, PAD, DM,  (+) family support, willingness for followthrough    PT Frequency  1x / week    PT Duration  8 weeks    PT Treatment/Interventions  ADLs/Self Care Home Management;Cryotherapy;Electrical Stimulation;Iontophoresis 4mg /ml Dexamethasone;Moist Heat;Traction;Ultrasound;Therapeutic exercise;Therapeutic activities;Neuromuscular re-education;Patient/family education;Passive range of motion;Manual techniques;Dry needling;Energy conservation;Taping;Visual/perceptual remediation/compensation    PT Next Visit Plan  rtc and  scap stabilization, Mackenzie neck protocol per doctor order, sitting up or incline position    PT Home Exercise Plan  see sheet    Consulted and Agree with Plan of Care  Patient       Patient will benefit from skilled therapeutic intervention in order to improve the following deficits and impairments:  Decreased activity tolerance, Decreased coordination, Decreased range of motion, Decreased mobility, Decreased strength, Hypomobility, Impaired flexibility, Impaired perceived functional ability, Increased fascial restricitons, Impaired UE functional use, Postural dysfunction, Improper body mechanics, Pain  Visit Diagnosis: Chronic left shoulder pain  Chronic right shoulder pain  Cervicalgia     Problem List Patient Active Problem List   Diagnosis Date Noted  . Shoulder pain 12/30/2016  . Skin lesion 12/30/2016  . Unstable angina (HCC) 09/03/2016  . AAA (abdominal aortic aneurysm) without rupture (HCC) 07/13/2016  . Popliteal artery aneurysm (HCC) 07/13/2016  . Carotid artery disease (HCC) 01/30/2015  . NSTEMI (non-ST elevated myocardial infarction) (HCC) 11/16/2014  . Medicare annual wellness visit, initial 10/21/2014  . Advance care planning 10/21/2014  . PAD (peripheral artery disease) (HCC) 09/09/2014  . COPD exacerbation (HCC) 08/21/2014  . DM (diabetes mellitus), type 2 with peripheral vascular complications (HCC) 05/24/2013  . Back pain 02/24/2013  . Cough 01/26/2011  . COPD (chronic obstructive pulmonary  disease) (HCC) 12/26/2010  . FATIGUE 05/11/2009  . Essential hypertension 05/08/2009  . Hyperlipidemia 06/20/2008  . CAD, NATIVE VESSEL 06/20/2008   Precious Bard, PT, DPT   Precious Bard 05/25/2017, 8:45 AM  [facility] MAIN [facility] SERVICES [address] [place], [place], [address] Phone: [phone]   Fax:  [phone]  Name: THARON BOMAR MRN: [id number] Date of Birth: November 06, 1945

## 2017-05-28 ENCOUNTER — Other Ambulatory Visit: Payer: Self-pay | Admitting: Cardiovascular Disease

## 2017-06-08 ENCOUNTER — Ambulatory Visit: Payer: Medicare Other

## 2017-06-08 DIAGNOSIS — M25511 Pain in right shoulder: Secondary | ICD-10-CM | POA: Diagnosis not present

## 2017-06-08 DIAGNOSIS — M25512 Pain in left shoulder: Secondary | ICD-10-CM | POA: Diagnosis not present

## 2017-06-08 DIAGNOSIS — G8929 Other chronic pain: Secondary | ICD-10-CM | POA: Diagnosis not present

## 2017-06-08 DIAGNOSIS — M542 Cervicalgia: Secondary | ICD-10-CM | POA: Diagnosis not present

## 2017-06-08 NOTE — Therapy (Signed)
[place] MAIN [facility] SERVICES [address] [place], [place], [address] Phone: [phone]   Fax:  [phone]  Physical Therapy Treatment  Patient Details  Name: [name] MRN: [id number] Date of Birth: 1946/03/28 Referring Provider: Donella Stade MD   Encounter Date: 06/08/2017  PT End of Session - 06/08/17 0804    Visit Number  5    Number of Visits  8    Date for PT Re-Evaluation  06/22/17    Authorization - Visit Number  5    Authorization - Number of Visits  10    PT Start Time  0800    PT Stop Time  0845    PT Time Calculation (min)  45 min    Activity Tolerance  Patient tolerated treatment well;Patient limited by pain    Behavior During Therapy  [facility] for tasks assessed/performed       Past Medical History:  Diagnosis Date  . CAD (coronary artery disease)    a. 1999: PCI-->LAD 2/2 MI; b. 2005: inf MI s/p PCI/DES x 4 to RCA; c. Myoview in 12/2005: EF 61%, no evidence for ischemia; d. cath 10/2014: occluded mLAD w/ L to L and L to R collats, dRCA 95% s/p PCI/DES 0%, mPDA 70%, LCx mild to mod irregs, procedure complicated by inf ST ele in recovery, repeat cath showed acute dRCA stent thrombosis o/w occluded mRPDA, PTCA dRCA, PCI/DES RPDA, aggrastat x 18 hr  . CKD (chronic kidney disease)   . COPD (chronic obstructive pulmonary disease) ([place])   . DM2 (diabetes mellitus, type 2) ([place])   . H/O hiatal hernia   . Heart attack ([place])   . HTN (hypertension)   . Hypercholesterolemia   . Impaired fasting glucose    Elevated after steroid injection  . Osteoarthritis of hip   . Osteoarthritis, knee   . PAD (peripheral artery disease) (HCC)    L fem to below the knee popliteal vein bypass graft  . Peri-rectal abscess 12/27/2011  . Tobacco abuse    Prior    Past Surgical History:  Procedure Laterality Date  . CORONARY ANGIOPLASTY    . [place], 2006   Multiple, LAD in 1999, RCA in 2005  . FEMORAL-POPLITEAL  BYPASS GRAFT  2016   L fem to below the knee popliteal vein bypass graft  . INCISE AND DRAIN ABCESS  2007   on scrotum  . INCISION AND DRAINAGE PERIRECTAL ABSCESS  12/28/2011   Procedure: IRRIGATION AND DEBRIDEMENT PERIRECTAL ABSCESS;  Surgeon: Merrie Roof, MD;  Location: [place];  Service: General;  Laterality: N/A;    There were no vitals filed for this visit.  Subjective Assessment - 06/08/17 0802    Subjective  Patient noticing some days better than others, always painful with L always worse than the R one. Neck still hurting, getting better though.     Pertinent History  Patient is a pleasant 71 year old male who presents with rotator cuff tendonitis and subacromial bursitis with cervicalgia. Left shoulder is worse, right shoulder is also affected. Pain began around 3-4 months ago around the time he started taking insulin. Incrementally got worse, no incident occurring to start pain. Some days worse than the other. 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 Is not able to golf at this time due to pain.     Limitations  Lifting;Writing;House hold activities    How long can you sit  comfortably?  N/A    How long can you stand comfortably?  N/A    How long can you walk comfortably?  N/A    Diagnostic tests  x ray, MRI    Patient Stated Goals  be able to sleep better, control pain better,     Currently in Pain?  Yes    Pain Score  4     Pain Location  Shoulder    Pain Orientation  Left    Pain Descriptors / Indicators  Aching    Pain Type  Chronic pain    Pain Onset  More than a month ago    Pain Score  3    Pain Location  Neck    Pain Orientation  Posterior    Pain Descriptors / Indicators  Aching    Pain Type  Chronic pain       Redo goals: pain: 4/10 current, worst pain 6/10 QuickDash: Work: 31.25%, sports: 50%, total =40.9% ER : reaching back to put coat on hard, can do hair a bit better now.  Golf   SciFit UBE 2 min forward 2 min backwards    TherEx Doorway stretch 3x 30 seconds Sidelying external rotation 2x 10 Rows GTB 2x  15 Walk walks: flexion 10x, abduction 10x (performed L and R)  Swiss ball forward rolls 10x, angle L and R 10x each side with 5 second holds    Scapular retractions 20x        Manual: AP and inferior mobilizations 6x 10 seconds grade II Distraction 8x 15 second holds in varying planes   STM to upper trap bilaterally, decreased pain Cervical stretch with overpressure at shoulder, side bending and rotation 3x 20 seconds Suboccipital release 2x 30 seconds    Pt. response to medical necessity:  Patient will continue to benefit from skilled physical therapy to improve mobility deficits and pain control for improved quality of life.                     PT Education - 06/08/17 0804    Education provided  Yes    Education Details  POC, functional strengthening and posture at work    Person(s) Educated  Patient    Methods  Explanation;Demonstration;Verbal cues    Comprehension  Verbalized understanding;Returned demonstration       PT Short Term Goals - 06/08/17 0808      PT SHORT TERM GOAL #1   Title  Patient will be independent in home exercise program to improve strength/mobility for better functional independence with ADLs.    Baseline  HEP given and performed    Time  2    Period  Weeks    Status  Partially Met      PT SHORT TERM GOAL #2   Title  Patient will report a worst pain of 5/10 on VAS in bilateral shoulders      to improve tolerance with ADLs and reduced symptoms with activities.     Baseline  10/18: 7/10 worst pain 11/29: worst pain 6/10    Time  2    Period  Weeks    Status  Partially Met        PT Long Term Goals - 06/08/17 0809      PT LONG TERM GOAL #1   Title  Patient will report a worst pain of 3/10 on VAS in bilateral shoulders  to improve tolerance with ADLs and reduced symptoms with activities.     Baseline    10/18: worst pain 7/10 11/29: worst 6/10      Time  8    Period  Weeks    Status  Partially Met      PT LONG TERM GOAL #2   Title  Patient will reduce QuickDash score to <20 as to demonstrate minimal disability with ADLs including improved mobility for better mobility with ADLs.     Baseline  10/18: 43.2% general, QuickDash Work 31.3%, Quickdash sport=43.8%; 11/29: 40.9% general: work 31.25%, sport50%    Time  8    Period  Weeks    Status  Partially Met      PT LONG TERM GOAL #3   Title  Patient will improve shoulder external rotation in order to maintain hair care pain-free.    Baseline  Able to perform hair care better, still slight pain increase, unable to put on coat without pain.     Time  8    Period  Weeks    Status  Partially Met      PT LONG TERM GOAL #4   Title  Patient will return to playing golf to allow for return to previous level of function.     Baseline  Patient unable to play golf at this time due to pain.     Time  8    Period  Weeks    Status  On-going            Plan - 06/08/17 0813    Clinical Impression Statement  Patient performed UBE for first time today without pain. Pain continues to limit patient's everyday life. Pain levels slightly decreased when compared to initial start of therapy. Tight paraspinal and neck musculature noted upon STM and mobilizations. Patient will continue to benefit from skilled physical therapy to improve mobility deficits and pain control for improved quality of life.     Rehab Potential  Good    Clinical Impairments Affecting Rehab Potential  (-) chronicity of pain, age, hx of COPD, angina, AAA, popliteal artery aneurysm, CAD, NSTEMI, PAD, DM,  (+) family support, willingness for followthrough    PT Frequency  1x / week    PT Duration  8 weeks    PT Treatment/Interventions  ADLs/Self Care Home Management;Cryotherapy;Electrical Stimulation;Iontophoresis 56m/ml Dexamethasone;Moist Heat;Traction;Ultrasound;Therapeutic exercise;Therapeutic activities;Neuromuscular  re-education;Patient/family education;Passive range of motion;Manual techniques;Dry needling;Energy conservation;Taping;Visual/perceptual remediation/compensation    PT Next Visit Plan  rtc and scap stabilization, Mackenzie neck protocol per doctor order, sitting up or incline position    PT Home Exercise Plan  see sheet    Consulted and Agree with Plan of Care  Patient       Patient will benefit from skilled therapeutic intervention in order to improve the following deficits and impairments:  Decreased activity tolerance, Decreased coordination, Decreased range of motion, Decreased mobility, Decreased strength, Hypomobility, Impaired flexibility, Impaired perceived functional ability, Increased fascial restricitons, Impaired UE functional use, Postural dysfunction, Improper body mechanics, Pain  Visit Diagnosis: Chronic left shoulder pain  Chronic right shoulder pain  Cervicalgia     Problem List Patient Active Problem List   Diagnosis Date Noted  . Shoulder pain 12/30/2016  . Skin lesion 12/30/2016  . Unstable angina ([place] 09/03/2016  . AAA (abdominal aortic aneurysm) without rupture ([place] 07/13/2016  . Popliteal artery aneurysm ([place] 07/13/2016  . Carotid artery disease ([place] 01/30/2015  . NSTEMI (non-ST elevated myocardial infarction) ([place] 11/16/2014  . Medicare annual wellness visit, initial 10/21/2014  . Advance care planning 10/21/2014  . PAD (peripheral artery disease) (  [place]) 09/09/2014  . COPD exacerbation ([place]) 08/21/2014  . DM (diabetes mellitus), type 2 with peripheral vascular complications ([place]) 70/35/0093  . Back pain 02/24/2013  . Cough 01/26/2011  . COPD (chronic obstructive pulmonary disease) ([place]) 12/26/2010  . FATIGUE 05/11/2009  . Essential hypertension 05/08/2009  . Hyperlipidemia 06/20/2008  . CAD, NATIVE VESSEL 06/20/2008  Janna Arch, PT, DPT    Janna Arch 06/08/2017, 10:21 AM  [place] MAIN [facility]  SERVICES [address] [place], [place], [address] Phone: [phone]   Fax:  [phone]  Name: [name] MRN: [id number] Date of Birth: [birth year]

## 2017-06-15 ENCOUNTER — Ambulatory Visit: Payer: Medicare Other | Attending: Family Medicine

## 2017-06-15 DIAGNOSIS — M25511 Pain in right shoulder: Secondary | ICD-10-CM | POA: Insufficient documentation

## 2017-06-15 DIAGNOSIS — M25512 Pain in left shoulder: Secondary | ICD-10-CM | POA: Insufficient documentation

## 2017-06-15 DIAGNOSIS — M542 Cervicalgia: Secondary | ICD-10-CM

## 2017-06-15 DIAGNOSIS — G8929 Other chronic pain: Secondary | ICD-10-CM | POA: Diagnosis not present

## 2017-06-15 NOTE — Therapy (Signed)
[place] MAIN [facility] SERVICES [address] [place], [place], [address] Phone: [phone]   Fax:  [phone]  Physical Therapy Treatment  Patient Details  Name: [name] MRN: [id number] Date of Birth: 21-Jun-1946 Referring Provider: Donella Stade MD   Encounter Date: 06/15/2017  PT End of Session - 06/15/17 0804    Visit Number  6    Number of Visits  8    Date for PT Re-Evaluation  06/22/17    Authorization - Visit Number  6    Authorization - Number of Visits  10    PT Start Time  0800    PT Stop Time  1324    PT Time Calculation (min)  44 min    Activity Tolerance  Patient tolerated treatment well;Patient limited by pain    Behavior During Therapy  [facility] for tasks assessed/performed       Past Medical History:  Diagnosis Date  . CAD (coronary artery disease)    a. 1999: PCI-->LAD 2/2 MI; b. 2005: inf MI s/p PCI/DES x 4 to RCA; c. Myoview in 12/2005: EF 61%, no evidence for ischemia; d. cath 10/2014: occluded mLAD w/ L to L and L to R collats, dRCA 95% s/p PCI/DES 0%, mPDA 70%, LCx mild to mod irregs, procedure complicated by inf ST ele in recovery, repeat cath showed acute dRCA stent thrombosis o/w occluded mRPDA, PTCA dRCA, PCI/DES RPDA, aggrastat x 18 hr  . CKD (chronic kidney disease)   . COPD (chronic obstructive pulmonary disease) ([place])   . DM2 (diabetes mellitus, type 2) ([place])   . H/O hiatal hernia   . Heart attack ([place])   . HTN (hypertension)   . Hypercholesterolemia   . Impaired fasting glucose    Elevated after steroid injection  . Osteoarthritis of hip   . Osteoarthritis, knee   . PAD (peripheral artery disease) ([facility])    L fem to below the knee popliteal vein bypass graft  . Peri-rectal abscess 12/27/2011  . Tobacco abuse    Prior    Past Surgical History:  Procedure Laterality Date  . CORONARY ANGIOPLASTY    . [place], 2006   Multiple, LAD in 1999, RCA in 2005  . FEMORAL-POPLITEAL  BYPASS GRAFT  2016   L fem to below the knee popliteal vein bypass graft  . INCISE AND DRAIN ABCESS  2007   on scrotum  . INCISION AND DRAINAGE PERIRECTAL ABSCESS  12/28/2011   Procedure: IRRIGATION AND DEBRIDEMENT PERIRECTAL ABSCESS;  Surgeon: Merrie Roof, MD;  Location: [place];  Service: General;  Laterality: N/A;    There were no vitals filed for this visit.  Subjective Assessment - 06/15/17 0802    Subjective  Patient noticing about a 5% improvement in shoulders, neck is feeling better.     Pertinent History  Patient is a pleasant 71 year old male who presents with rotator cuff tendonitis and subacromial bursitis with cervicalgia. Left shoulder is worse, right shoulder is also affected. Pain began around 3-4 months ago around the time he started taking insulin. Incrementally got worse, no incident occurring to start pain. Some days worse than the other. 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 Is not able to golf at this time due to pain.     Limitations  Lifting;Writing;House hold activities    How long can you sit comfortably?  N/A    How long can you stand  comfortably?  N/A    How long can you walk comfortably?  N/A    Diagnostic tests  x ray, MRI    Patient Stated Goals  be able to sleep better, control pain better,     Currently in Pain?  Yes    Pain Score  4     Pain Location  Shoulder    Pain Orientation  Right;Left    Pain Descriptors / Indicators  Aching    Pain Type  Chronic pain    Pain Onset  More than a month ago    Multiple Pain Sites  Yes    Pain Score  3    Pain Location  Neck    Pain Orientation  Posterior    Pain Descriptors / Indicators  Aching    Pain Type  Chronic pain      SciFit UBE 2 min forward 2 min backwards    TherEx Supine scapular punches 10x each arm 2 sets  Doorway stretch 3x 30 seconds Sidelying external rotation 2x 10 Rows GTB 2x  15 Walk walks: flexion 10x, abduction 10x (performed L and R)  Swiss ball on  table  forward rolls 10x, angle L and R 10x each side with 5 second holds    Scapular retractions 20x        Manual: Inferior mobilizations of AC joints, hypomobile  AP and inferior mobilizations 6x 10 seconds grade II Distraction 8x 15 second holds in varying planes   STM to upper trap bilaterally, decreased pain Cervical stretch with overpressure at shoulder, side bending and rotation 3x 20 seconds Suboccipital release 2x 30 seconds    Pt. response to medical necessity:  Patient will continue to benefit from skilled physical therapy to improve mobility deficits and pain control for improved quality of life                           PT Education - 06/15/17 0803    Education provided  Yes    Education Details  HEP compliance, functional strength and posture    Person(s) Educated  Patient    Methods  Explanation;Demonstration;Verbal cues    Comprehension  Verbalized understanding;Returned demonstration       PT Short Term Goals - 06/08/17 0808      PT SHORT TERM GOAL #1   Title  Patient will be independent in home exercise program to improve strength/mobility for better functional independence with ADLs.    Baseline  HEP given and performed    Time  2    Period  Weeks    Status  Partially Met      PT SHORT TERM GOAL #2   Title  Patient will report a worst pain of 5/10 on VAS in bilateral shoulders      to improve tolerance with ADLs and reduced symptoms with activities.     Baseline  10/18: 7/10 worst pain 11/29: worst pain 6/10    Time  2    Period  Weeks    Status  Partially Met        PT Long Term Goals - 06/08/17 0809      PT LONG TERM GOAL #1   Title  Patient will report a worst pain of 3/10 on VAS in bilateral shoulders  to improve tolerance with ADLs and reduced symptoms with activities.     Baseline  10/18: worst pain 7/10 11/29: worst 6/10     Time  8    Period  Weeks    Status  Partially Met      PT LONG TERM GOAL #2   Title   Patient will reduce QuickDash score to <20 as to demonstrate minimal disability with ADLs including improved mobility for better mobility with ADLs.     Baseline  10/18: 43.2% general, QuickDash Work 31.3%, Quickdash sport=43.8%; 11/29: 40.9% general: work 31.25%, sport50%    Time  8    Period  Weeks    Status  Partially Met      PT LONG TERM GOAL #3   Title  Patient will improve shoulder external rotation in order to maintain hair care pain-free.    Baseline  Able to perform hair care better, still slight pain increase, unable to put on coat without pain.     Time  8    Period  Weeks    Status  Partially Met      PT LONG TERM GOAL #4   Title  Patient will return to playing golf to allow for return to previous level of function.     Baseline  Patient unable to play golf at this time due to pain.     Time  8    Period  Weeks    Status  On-going            Plan - 06/15/17 0855    Clinical Impression Statement  Patient mobility of cervical musculature improving, however pain continues to persist in bilateral shoulders with L > R. Patient has anterior shifted gh joint causing pain with flexion and abduction. Distraction and ER performed to decrease pain levels with minimal results. Patient will continue to benefit from skilled physical therapy to improve mobility deficits and pain control for improved quality of life    Rehab Potential  Good    Clinical Impairments Affecting Rehab Potential  (-) chronicity of pain, age, hx of COPD, angina, AAA, popliteal artery aneurysm, CAD, NSTEMI, PAD, DM,  (+) family support, willingness for followthrough    PT Frequency  1x / week    PT Duration  8 weeks    PT Treatment/Interventions  ADLs/Self Care Home Management;Cryotherapy;Electrical Stimulation;Iontophoresis 15m/ml Dexamethasone;Moist Heat;Traction;Ultrasound;Therapeutic exercise;Therapeutic activities;Neuromuscular re-education;Patient/family education;Passive range of motion;Manual  techniques;Dry needling;Energy conservation;Taping;Visual/perceptual remediation/compensation    PT Next Visit Plan  d/c    PT Home Exercise Plan  see sheet    Consulted and Agree with Plan of Care  Patient       Patient will benefit from skilled therapeutic intervention in order to improve the following deficits and impairments:  Decreased activity tolerance, Decreased coordination, Decreased range of motion, Decreased mobility, Decreased strength, Hypomobility, Impaired flexibility, Impaired perceived functional ability, Increased fascial restricitons, Impaired UE functional use, Postural dysfunction, Improper body mechanics, Pain  Visit Diagnosis: Chronic left shoulder pain  Chronic right shoulder pain  Cervicalgia     Problem List Patient Active Problem List   Diagnosis Date Noted  . Shoulder pain 12/30/2016  . Skin lesion 12/30/2016  . Unstable angina ([place] 09/03/2016  . AAA (abdominal aortic aneurysm) without rupture ([facility] 07/13/2016  . Popliteal artery aneurysm ([facility] 07/13/2016  . Carotid artery disease ([facility] 01/30/2015  . NSTEMI (non-ST elevated myocardial infarction) ([facility] 11/16/2014  . Medicare annual wellness visit, initial 10/21/2014  . Advance care planning 10/21/2014  . PAD (peripheral artery disease) ([facility] 09/09/2014  . COPD exacerbation ([facility] 08/21/2014  . DM (diabetes mellitus), type 2 with peripheral vascular complications ([facility] 114/97/0263 . Back  pain 02/24/2013  . Cough 01/26/2011  . COPD (chronic obstructive pulmonary disease) ([place]) 12/26/2010  . FATIGUE 05/11/2009  . Essential hypertension 05/08/2009  . Hyperlipidemia 06/20/2008  . CAD, NATIVE VESSEL 06/20/2008  Janna Arch, PT, DPT    Janna Arch 06/15/2017, 8:55 AM  [place] MAIN [facility] SERVICES [address] [place], [place], [address] Phone: [phone]   Fax:  [phone]  Name: [name] MRN: [id number] Date of Birth: [birth year]

## 2017-06-18 ENCOUNTER — Other Ambulatory Visit: Payer: Self-pay | Admitting: Family Medicine

## 2017-06-22 ENCOUNTER — Ambulatory Visit: Payer: Medicare Other

## 2017-06-22 DIAGNOSIS — G8929 Other chronic pain: Secondary | ICD-10-CM | POA: Diagnosis not present

## 2017-06-22 DIAGNOSIS — M25512 Pain in left shoulder: Secondary | ICD-10-CM | POA: Diagnosis not present

## 2017-06-22 DIAGNOSIS — M542 Cervicalgia: Secondary | ICD-10-CM

## 2017-06-22 DIAGNOSIS — M25511 Pain in right shoulder: Secondary | ICD-10-CM | POA: Diagnosis not present

## 2017-06-22 NOTE — Therapy (Signed)
[place] MAIN [facility] SERVICES [address] [place], [place], [address] Phone: [phone]   Fax:  [phone]  Physical Therapy Treatment  Patient Details  Name: [name] MRN: [id number] Date of Birth: 04-13-1946 Referring Provider: Donella Stade MD   Encounter Date: 06/22/2017  PT End of Session - 06/22/17 0849    Visit Number  7    Number of Visits  8    Date for PT Re-Evaluation  06/22/17    Authorization - Visit Number  7    Authorization - Number of Visits  10    PT Start Time  2233    PT Stop Time  0930    PT Time Calculation (min)  46 min    Activity Tolerance  Patient tolerated treatment well;Patient limited by pain    Behavior During Therapy  [facility] for tasks assessed/performed       Past Medical History:  Diagnosis Date  . CAD (coronary artery disease)    a. 1999: PCI-->LAD 2/2 MI; b. 2005: inf MI s/p PCI/DES x 4 to RCA; c. Myoview in 12/2005: EF 61%, no evidence for ischemia; d. cath 10/2014: occluded mLAD w/ L to L and L to R collats, dRCA 95% s/p PCI/DES 0%, mPDA 70%, LCx mild to mod irregs, procedure complicated by inf ST ele in recovery, repeat cath showed acute dRCA stent thrombosis o/w occluded mRPDA, PTCA dRCA, PCI/DES RPDA, aggrastat x 18 hr  . CKD (chronic kidney disease)   . COPD (chronic obstructive pulmonary disease) ([place])   . DM2 (diabetes mellitus, type 2) ([place])   . H/O hiatal hernia   . Heart attack ([place])   . HTN (hypertension)   . Hypercholesterolemia   . Impaired fasting glucose    Elevated after steroid injection  . Osteoarthritis of hip   . Osteoarthritis, knee   . PAD (peripheral artery disease) (H[facility])    L fem to below the knee popliteal vein bypass graft  . Peri-rectal abscess 12/27/2011  . Tobacco abuse    Prior    Past Surgical History:  Procedure Laterality Date  . CORONARY ANGIOPLASTY    . [place], 2006   Multiple, LAD in 1999, RCA in 2005  . FEMORAL-POPLITEAL  BYPASS GRAFT  2016   L fem to below the knee popliteal vein bypass graft  . INCISE AND DRAIN ABCESS  2007   on scrotum  . INCISION AND DRAINAGE PERIRECTAL ABSCESS  12/28/2011   Procedure: IRRIGATION AND DEBRIDEMENT PERIRECTAL ABSCESS;  Surgeon: Merrie Roof, MD;  Location: [place];  Service: General;  Laterality: N/A;    There were no vitals filed for this visit.  Subjective Assessment - 06/22/17 0847    Subjective  Patient noticing his pain has been decreasing, worst pain of 5/10 due to snow noted this week. Is prepared and prefers to become independent in home based program.     Pertinent History  Patient is a pleasant 71 year old male who presents with rotator cuff tendonitis and subacromial bursitis with cervicalgia. Left shoulder is worse, right shoulder is also affected. Pain began around 3-4 months ago around the time he started taking insulin. Incrementally got worse, no incident occurring to start pain. Some days worse than the other. 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 Is not able to golf at this time due to pain.     Limitations  Lifting;Writing;House hold activities  How long can you sit comfortably?  N/A    How long can you stand comfortably?  N/A    How long can you walk comfortably?  N/A    Diagnostic tests  x ray, MRI    Patient Stated Goals  be able to sleep better, control pain better,     Currently in Pain?  Yes    Pain Score  3     Pain Location  Shoulder    Pain Orientation  Right;Left    Pain Descriptors / Indicators  Aching    Pain Type  Chronic pain    Pain Onset  More than a month ago    Pain Frequency  Intermittent    Multiple Pain Sites  Yes    Pain Score  2    Pain Location  Neck    Pain Orientation  Posterior    Pain Descriptors / Indicators  Aching;Guarding    Pain Type  Chronic pain    Pain Onset  More than a month ago       Worst pain 5/10  3/10 shoulder pain 2-3/10 neck pain    QuickDash: 18.2%, Work 25%,  Sport 25%   Right Left  Shoulder Flexion 151 158  Shoulder Abduction 145 164  ER 41 46  IR 70 61     Decreased pain with ROM, just slight discomfort.    TherEx Doorway stretch 3x 30 seconds Sidelying external rotation 2x 10 each arm  Rows GTB 2x  15  Scapular retractions 20x        Manual: Inferior mobilizations of AC joints, hypomobile  AP and inferior mobilizations 6x 10 seconds grade II Distraction 8x 15 second holds in varying planes   STM to upper trap bilaterally, decreased pain Cervical stretch with overpressure at shoulder, side bending and rotation 3x 20 seconds Suboccipital release 2x 30 seconds    Pt. response to medical necessity:  Patient will continue to benefit from skilled physical therapy to improve mobility deficits and pain control for improved quality of life                        PT Education - 06/22/17 0849    Education provided  Yes    Education Details  HEP compliance, POC,     Person(s) Educated  Patient    Methods  Explanation;Demonstration    Comprehension  Verbalized understanding;Returned demonstration       PT Short Term Goals - 06/22/17 0850      PT SHORT TERM GOAL #1   Title  Patient will be independent in home exercise program to improve strength/mobility for better functional independence with ADLs.    Baseline  HEP given and performed    Time  2    Period  Weeks    Status  Achieved      PT SHORT TERM GOAL #2   Title  Patient will report a worst pain of 5/10 on VAS in bilateral shoulders      to improve tolerance with ADLs and reduced symptoms with activities.     Baseline  10/18: 7/10 worst pain 11/29: worst pain 6/10 12/13: worst pain 5/10    Time  2    Period  Weeks    Status  Achieved        PT Long Term Goals - 06/22/17 5732      PT LONG TERM GOAL #1   Title  Patient will report a worst pain  of 3/10 on VAS in bilateral shoulders  to improve tolerance with ADLs and reduced symptoms with  activities.     Baseline  10/18: worst pain 7/10 11/29: worst 6/10 2023/07/01: worst pain 5/10    Time  8    Period  Weeks    Status  Partially Met      PT LONG TERM GOAL #2   Title  Patient will reduce QuickDash score to <20 as to demonstrate minimal disability with ADLs including improved mobility for better mobility with ADLs.     Baseline  07-01-23: Score= 18.2%, work 25%, sport 25%    Time  8    Period  Weeks    Status  Achieved      PT LONG TERM GOAL #3   Title  Patient will improve shoulder external rotation in order to maintain hair care pain-free.    Baseline  able to do hair care with minimal pain, difficulty with putting coat on.     Time  8    Period  Weeks    Status  Achieved      PT LONG TERM GOAL #4   Title  Patient will return to playing golf to allow for return to previous level of function.     Baseline  hasn't been able to play due to winter weather.     Time  8    Period  Weeks    Status  Unable to assess            Plan - 30-Jun-2017 1212    Clinical Impression Statement  Patient progressed towards/met all goals and desires a home based program at this time. QuickDash=18% with Work and sport scores 25%, ROM is functional with no pain, just discomfort at end range of motion. Patient will be progressed to a home based program at this time. I will be happy to see patient again the future as needed.     Rehab Potential  Good    Clinical Impairments Affecting Rehab Potential  (-) chronicity of pain, age, hx of COPD, angina, AAA, popliteal artery aneurysm, CAD, NSTEMI, PAD, DM,  (+) family support, willingness for followthrough    PT Frequency  1x / week    PT Duration  8 weeks    PT Treatment/Interventions  ADLs/Self Care Home Management;Cryotherapy;Electrical Stimulation;Iontophoresis 2m/ml Dexamethasone;Moist Heat;Traction;Ultrasound;Therapeutic exercise;Therapeutic activities;Neuromuscular re-education;Patient/family education;Passive range of motion;Manual  techniques;Dry needling;Energy conservation;Taping;Visual/perceptual remediation/compensation    PT Next Visit Plan  d/c    PT Home Exercise Plan  see sheet    Consulted and Agree with Plan of Care  Patient       Patient will benefit from skilled therapeutic intervention in order to improve the following deficits and impairments:  Decreased activity tolerance, Decreased coordination, Decreased range of motion, Decreased mobility, Decreased strength, Hypomobility, Impaired flexibility, Impaired perceived functional ability, Increased fascial restricitons, Impaired UE functional use, Postural dysfunction, Improper body mechanics, Pain  Visit Diagnosis: Chronic left shoulder pain  Chronic right shoulder pain  Cervicalgia   G-Codes - 112/21/181212    Functional Assessment Tool Used (Outpatient Only)  QuickDash, ROM, VAS, clinical judgement     Functional Limitation  Carrying, moving and handling objects    Carrying, Moving and Handling Objects Current Status ((I7782  At least 1 percent but less than 20 percent impaired, limited or restricted    Carrying, Moving and Handling Objects Goal Status (([id number]  At least 1 percent but less than 20 percent impaired, limited or restricted  Carrying, Moving and Handling Objects Discharge Status [phone])  At least 1 percent but less than 20 percent impaired, limited or restricted       Problem List Patient Active Problem List   Diagnosis Date Noted  . Shoulder pain 12/30/2016  . Skin lesion 12/30/2016  . Unstable angina ([place]) 09/03/2016  . AAA (abdominal aortic aneurysm) without rupture ([place]) 07/13/2016  . Popliteal artery aneurysm ([place]) 07/13/2016  . Carotid artery disease ([place]) 01/30/2015  . NSTEMI (non-ST elevated myocardial infarction) ([place]) 11/16/2014  . Medicare annual wellness visit, initial 10/21/2014  . Advance care planning 10/21/2014  . PAD (peripheral artery disease) ([place]) 09/09/2014  . COPD exacerbation ([place]) 08/21/2014  . DM  (diabetes mellitus), type 2 with peripheral vascular complications ([place]) 01/72/4195  . Back pain 02/24/2013  . Cough 01/26/2011  . COPD (chronic obstructive pulmonary disease) ([place]) 12/26/2010  . FATIGUE 05/11/2009  . Essential hypertension 05/08/2009  . Hyperlipidemia 06/20/2008  . CAD, NATIVE VESSEL 06/20/2008   Janna Arch, PT, DPT   Janna Arch 06/22/2017, 12:14 PM  [place] MAIN [facility] SERVICES [address] [place], [place], [address] Phone: [phone]   Fax:  [phone]  Name: [name] MRN: [id number] Date of Birth: 1945-11-10

## 2017-06-25 ENCOUNTER — Other Ambulatory Visit: Payer: Self-pay | Admitting: Cardiovascular Disease

## 2017-06-27 ENCOUNTER — Other Ambulatory Visit: Payer: Self-pay

## 2017-06-27 MED ORDER — FENOFIBRATE 145 MG PO TABS: 145.0000 mg | ORAL_TABLET | Freq: Every day | ORAL | 0 refills | Status: DC

## 2017-06-27 MED ORDER — METOPROLOL TARTRATE 25 MG PO TABS: ORAL_TABLET | ORAL | 0 refills | Status: DC

## 2017-06-27 MED ORDER — HYDROCHLOROTHIAZIDE 25 MG PO TABS: 25.0000 mg | ORAL_TABLET | Freq: Every day | ORAL | 0 refills | Status: DC

## 2017-06-27 MED ORDER — TICAGRELOR 60 MG PO TABS: 60.0000 mg | ORAL_TABLET | Freq: Two times a day (BID) | ORAL | 0 refills | Status: DC

## 2017-06-27 NOTE — Telephone Encounter (Signed)
Refill sent for Tricor 145 mg, Brilinta 60 mg, Metoprolol tart 25 mg 1/2 tablet, HCTZ 25 mg to [facility].

## 2017-07-06 ENCOUNTER — Ambulatory Visit: Payer: Medicare Other

## 2017-07-10 ENCOUNTER — Ambulatory Visit (INDEPENDENT_AMBULATORY_CARE_PROVIDER_SITE_OTHER): Payer: Medicare Other | Admitting: Vascular Surgery

## 2017-07-10 ENCOUNTER — Encounter (INDEPENDENT_AMBULATORY_CARE_PROVIDER_SITE_OTHER): Payer: Self-pay | Admitting: Vascular Surgery

## 2017-07-10 ENCOUNTER — Ambulatory Visit (INDEPENDENT_AMBULATORY_CARE_PROVIDER_SITE_OTHER): Payer: Medicare Other

## 2017-07-10 VITALS — BP 121/72 | HR 62 | Resp 17 | Wt 207.0 lb

## 2017-07-10 DIAGNOSIS — I714 Abdominal aortic aneurysm, without rupture, unspecified: Secondary | ICD-10-CM

## 2017-07-10 DIAGNOSIS — I724 Aneurysm of artery of lower extremity: Secondary | ICD-10-CM

## 2017-07-10 DIAGNOSIS — I739 Peripheral vascular disease, unspecified: Secondary | ICD-10-CM | POA: Diagnosis not present

## 2017-07-10 DIAGNOSIS — I208 Other forms of angina pectoris: Secondary | ICD-10-CM | POA: Diagnosis not present

## 2017-07-10 DIAGNOSIS — I6523 Occlusion and stenosis of bilateral carotid arteries: Secondary | ICD-10-CM | POA: Diagnosis not present

## 2017-07-10 DIAGNOSIS — J432 Centrilobular emphysema: Secondary | ICD-10-CM | POA: Diagnosis not present

## 2017-07-10 DIAGNOSIS — I2510 Atherosclerotic heart disease of native coronary artery without angina pectoris: Secondary | ICD-10-CM | POA: Diagnosis not present

## 2017-07-11 ENCOUNTER — Encounter (INDEPENDENT_AMBULATORY_CARE_PROVIDER_SITE_OTHER): Payer: Self-pay | Admitting: Vascular Surgery

## 2017-07-11 NOTE — Progress Notes (Signed)
MRN : [id number]  [name] is a 72 y.o. ([birth year]) male who presents with chief complaint of  Chief Complaint  Patient presents with  . Follow-up    78yraorta iliac,abi,bil art  .  History of Present Illness: The patient returns to the office for followup and review of the noninvasive studies. There have been no interval changes in lower extremity symptoms. No interval shortening of the patient's claudication distance or development of rest pain symptoms. No new ulcers or wounds have occurred since the last visit.  There have been no significant changes to the patient's overall health care.  The patient denies amaurosis fugax or recent TIA symptoms. There are no recent neurological changes noted. The patient denies history of DVT, PE or superficial thrombophlebitis. The patient denies recent episodes of angina or shortness of breath.     Current Meds  Medication Sig  . albuterol (PROAIR HFA) 108 (90 BASE) MCG/ACT inhaler Inhale 2 puffs into the lungs 3 (three) times daily as needed (cough).  .Marland Kitchenaspirin 81 MG EC tablet Take 1 tablet (81 mg total) by mouth daily.  .Marland Kitchenatorvastatin (LIPITOR) 40 MG tablet Take 1 tablet (40 mg total) by mouth daily.  . Blood Glucose Monitoring Suppl (RELION CONFIRM GLUCOSE MONITOR) W/DEVICE KIT Check blood sugar once daily as needed.  Diagnosis:  250.00  Non-insulin dependent  . fenofibrate (TRICOR) 145 MG tablet Take 1 tablet (145 mg total) by mouth daily.  .Marland KitchenglipiZIDE (GLUCOTROL) 5 MG tablet TAKE 1 TABLET BY MOUTH ONCE DAILY BEFORE BREAKFAST  . glucosamine-chondroitin 500-400 MG tablet Take 1 tablet by mouth 2 (two) times daily.  .Marland Kitchenglucose blood (RELION CONFIRM/MICRO TEST) test strip Use as instructed to test blood sugar once daily as needed.  Diagnosis:  250.00  Non insulin-dependent.  . hydrochlorothiazide (HYDRODIURIL) 25 MG tablet Take 1 tablet (25 mg total) by mouth daily.  . Insulin Glargine (LANTUS SOLOSTAR) 100 UNIT/ML Solostar Pen Inject  up to 35 units per day  . Insulin Pen Needle (PEN NEEDLES) 31G X 6 MM MISC Use as directed with insulin pen  . lisinopril (PRINIVIL,ZESTRIL) 40 MG tablet Take 1 tablet (40 mg total) by mouth daily.  . meclizine (ANTIVERT) 25 MG tablet Take 25 mg by mouth every 6 (six) hours as needed. For dizziness/vertigo  . metFORMIN (GLUCOPHAGE) 500 MG tablet TAKE TWO TABLETS BY MOUTH TWICE DAILY WITH A MEAL  . metoprolol tartrate (LOPRESSOR) 25 MG tablet TAKE 1/2 (ONE-HALF) TABLET BY MOUTH TWICE DAILY  . Multiple Vitamins-Minerals (PRESERVISION AREDS 2 PO) Take 1 tablet by mouth 2 (two) times daily.  . nitroGLYCERIN (NITROSTAT) 0.4 MG SL tablet Place 1 tablet (0.4 mg total) under the tongue every 5 (five) minutes as needed. For chest pain  . RELION LANCETS MICRO-THIN 33G MISC Use to test blood sugar once daily as needed.  Diagnosis:  250.00   Non insulin dependent.  . SYMBICORT 160-4.5 MCG/ACT inhaler INHALE TWO PUFFS BY MOUTH ONCE DAILY IN THE MORNING  . ticagrelor (BRILINTA) 60 MG TABS tablet Take 1 tablet (60 mg total) by mouth 2 (two) times daily.  . traMADol (ULTRAM) 50 MG tablet Take 1 tablet (50 mg total) by mouth every 6 (six) hours as needed.    Past Medical History:  Diagnosis Date  . CAD (coronary artery disease)    a. 1999: PCI-->LAD 2/2 MI; b. 2005: inf MI s/p PCI/DES x 4 to RCA; c. Myoview in 12/2005: EF 61%, no evidence for ischemia; d. cath  10/2014: occluded mLAD w/ L to L and L to R collats, dRCA 95% s/p PCI/DES 0%, mPDA 70%, LCx mild to mod irregs, procedure complicated by inf ST ele in recovery, repeat cath showed acute dRCA stent thrombosis o/w occluded mRPDA, PTCA dRCA, PCI/DES RPDA, aggrastat x 18 hr  . CKD (chronic kidney disease)   . COPD (chronic obstructive pulmonary disease) ([place])   . DM2 (diabetes mellitus, type 2) ([place])   . H/O hiatal hernia   . Heart attack ([place])   . HTN (hypertension)   . Hypercholesterolemia   . Impaired fasting glucose    Elevated after steroid injection    . Osteoarthritis of hip   . Osteoarthritis, knee   . PAD (peripheral artery disease) (HCC)    L fem to below the knee popliteal vein bypass graft  . Peri-rectal abscess 12/27/2011  . Tobacco abuse    Prior    Past Surgical History:  Procedure Laterality Date  . CORONARY ANGIOPLASTY    . [place], 2006   Multiple, LAD in 1999, RCA in 2005  . FEMORAL-POPLITEAL BYPASS GRAFT  2016   L fem to below the knee popliteal vein bypass graft  . INCISE AND DRAIN ABCESS  2007   on scrotum  . INCISION AND DRAINAGE PERIRECTAL ABSCESS  12/28/2011   Procedure: IRRIGATION AND DEBRIDEMENT PERIRECTAL ABSCESS;  Surgeon: Merrie Roof, MD;  Location: [facility];  Service: General;  Laterality: N/A;    Social History Social History   Tobacco Use  . Smoking status: Former Smoker    Packs/day: 1.00    Years: 40.00    Pack years: 40.00    Types: Cigarettes    Last attempt to quit: 10/21/1998    Years since quitting: 18.7  . Smokeless tobacco: Never Used  . Tobacco comment: Past smoker, states he will smoke maybe 1 cigarette/ month or so still  Substance Use Topics  . Alcohol use: Yes    Alcohol/week: 0.0 oz    Comment: Occasional beer on the weekends  . Drug use: No    Family History Family History  Problem Relation Age of Onset  . Heart failure Father        CHF  . CAD Mother   . Diabetes Mother   . Colon cancer Neg Hx   . Prostate cancer Neg Hx     Allergies  Allergen Reactions  . Augmentin [Amoxicillin-Pot Clavulanate] Other (See Comments)    Nausea,vomiting,diarrhea  . Morphine And Related Nausea And Vomiting    Vomiting, GI upset     REVIEW OF SYSTEMS (Negative unless checked)  Constitutional: [] Weight loss  [] Fever  [] Chills Cardiac: [] Chest pain   [] Chest pressure   [] Palpitations   [] Shortness of breath when laying flat   [] Shortness of breath with exertion. Vascular:  [x] Pain in legs with walking   [] Pain in legs at rest  [] History of DVT    [] Phlebitis   [] Swelling in legs   [] Varicose veins   [] Non-healing ulcers Pulmonary:   [] Uses home oxygen   [] Productive cough   [] Hemoptysis   [] Wheeze  [] COPD   [] Asthma Neurologic:  [] Dizziness   [] Seizures   [] History of stroke   [] History of TIA  [] Aphasia   [] Vissual changes   [] Weakness or numbness in arm   [] Weakness or numbness in leg Musculoskeletal:   [] Joint swelling   [] Joint pain   [] Low back pain Hematologic:  [] Easy bruising  [] Easy bleeding   [] Hypercoagulable state   []   Anemic Gastrointestinal:  [] Diarrhea   [] Vomiting  [] Gastroesophageal reflux/heartburn   [] Difficulty swallowing. Genitourinary:  [] Chronic kidney disease   [] Difficult urination  [] Frequent urination   [] Blood in urine Skin:  [] Rashes   [] Ulcers  Psychological:  [] History of anxiety   []  History of major depression.  Physical Examination  Vitals:   07/10/17 1018  BP: 121/72  Pulse: 62  Resp: 17  Weight: 207 lb (93.9 kg)   Body mass index is 31.94 kg/m. Gen: WD/WN, NAD Head: [place]/AT, No temporalis wasting.  Ear/Nose/Throat: Hearing grossly intact, nares w/o erythema or drainage Eyes: PER, EOMI, sclera nonicteric.  Neck: Supple, no large masses.   Pulmonary:  Good air movement, no audible wheezing bilaterally, no use of accessory muscles.  Cardiac: RRR, no JVD Vascular: Incisions well-healed left leg Vessel Right Left  Radial Palpable Palpable  PT Palpable Palpable  DP Palpable Palpable  Gastrointestinal: Non-distended. No guarding/no peritoneal signs.  Musculoskeletal: M/S 5/5 throughout.  No deformity or atrophy.  Neurologic: CN 2-12 intact. Symmetrical.  Speech is fluent. Motor exam as listed above. Psychiatric: Judgment intact, Mood & affect appropriate for pt's clinical situation. Dermatologic: No rashes or ulcers noted.  No changes consistent with cellulitis. Lymph : No lichenification or skin changes of chronic lymphedema.  CBC Lab Results  Component Value Date   WBC 7.7 09/04/2016    HGB 14.6 09/04/2016   HCT 41.6 09/04/2016   MCV 91.6 09/04/2016   PLT 161 09/04/2016    BMET    Component Value Date/Time   NA 139 03/28/2017 0732   NA 134 (L) 10/29/2014 0908   K 4.0 03/28/2017 0732   K 4.3 10/29/2014 0908   CL 103 03/28/2017 0732   CL 103 10/29/2014 0908   CO2 28 03/28/2017 0732   CO2 24 10/29/2014 0908   GLUCOSE 173 (H) 03/28/2017 0732   GLUCOSE 142 (H) 10/29/2014 0908   BUN 23 03/28/2017 0732   BUN 20 10/29/2014 0908   CREATININE 1.47 03/28/2017 0732   CREATININE 1.32 (H) 10/29/2014 0908   CREATININE 0.96 01/28/2011 0855   CALCIUM 9.6 03/28/2017 0732   CALCIUM 9.3 10/29/2014 0908   GFRNONAA 39 (L) 09/04/2016 0209   GFRNONAA 55 (L) 10/29/2014 0908   GFRAA 46 (L) 09/04/2016 0209   GFRAA >60 10/29/2014 0908   CrCl cannot be calculated (Patient's most recent lab result is older than the maximum 21 days allowed.).  COAG Lab Results  Component Value Date   INR 0.9 10/24/2014   INR 1.07 12/28/2011    Radiology No results found.  Assessment/Plan 1. PAD (peripheral artery disease) (HCC)  Recommend:  The patient has evidence of atherosclerosis of the lower extremities with claudication.  The patient does not voice lifestyle limiting changes at this point in time.  Noninvasive studies do not suggest clinically significant change.  No invasive studies, angiography or surgery at this time The patient should continue walking and begin a more formal exercise program.  The patient should continue antiplatelet therapy and aggressive treatment of the lipid abnormalities  No changes in the patient's medications at this time  The patient should continue wearing graduated compression socks 10-15 mmHg strength to control the mild edema.   - VAS [place] ABI WITH/WO TBI; Future  2. AAA (abdominal aortic aneurysm) without rupture (HCC) No surgery or intervention at this time. The patient has an asymptomatic abdominal aortic aneurysm that is less than 4 cm in  maximal diameter.  I have discussed the natural history of abdominal aortic  aneurysm and the small risk of rupture for aneurysm less than 5 cm in size.  However, as these small aneurysms tend to enlarge over time, continued surveillance with ultrasound or CT scan is mandatory.  I have also discussed optimizing medical management with hypertension and lipid control and the importance of abstinence from tobacco.  The patient is also encouraged to exercise a minimum of 30 minutes 4 times a week.  Should the patient develop new onset abdominal or back pain or signs of peripheral embolization they are instructed to seek medical attention immediately and to alert the physician providing care that they have an aneurysm.  The patient voices their understanding. The patient will return in 12 months with an aortic duplex.  - VAS US AORTA/IVC/ILIACS; Future  3. Popliteal artery aneurysm ([place]) See #1&2 - VAS [place] LOWER EXTREMITY ARTERIAL DUPLEX; Future  4. Bilateral carotid artery stenosis Recommend:  Given the patient's asymptomatic subcritical stenosis no further invasive testing or surgery at this time.  Duplex ultrasound shows <60% stenosis bilaterally.  Continue antiplatelet therapy as prescribed Continue management of CAD, HTN and Hyperlipidemia Healthy heart diet,  encouraged exercise at least 4 times per week Follow up in 12 months with duplex ultrasound and physical exam    5. Atherosclerosis of native coronary artery of native heart without angina pectoris Continue cardiac and antihypertensive medications as already ordered and reviewed, no changes at this time.  Continue statin as ordered and reviewed, no changes at this time  Nitrates PRN for chest pain   6. Centrilobular emphysema (HCC) Continue pulmonary medications and aerosols as already ordered, these medications have been reviewed and there are no changes at this time.      Hortencia Pilar, MD  07/11/2017 2:23 PM

## 2017-07-16 DIAGNOSIS — I2510 Atherosclerotic heart disease of native coronary artery without angina pectoris: Secondary | ICD-10-CM | POA: Insufficient documentation

## 2017-07-16 NOTE — Progress Notes (Signed)
Cardiology Office Note  Date:  07/18/2017   ID:  [name], DOB [birth year], MRN [id number]  PCP:  Brett Ghent, MD   Chief Complaint  Patient presents with  . Other    12 month foloow up. Patient denies chest pain and SOB. Meds reviewed verbally with patient.     HPI:  [name] is a pleasant 72 year old gentleman with history of  coronary artery disease,  diabetes,  PAD,  prior stent to the LAD and RCA,  left femoropopliteal bypass with repair of aneurysm,  long history of smoking for 50 years,   non-STEMI 10/24/2014   previous anginal equivalent, scapular pain between his shoulder blades. Moderate carotid disease on the left who presents for routine follow-up of his coronary artery disease.   Recent vascular studies reviewed with him in detail from Dr. Delana May  asymptomatic abdominal aortic aneurysm that is less than 4 cm in maximal diameter.  Duplex ultrasound shows <60% stenosis bilaterally.  His main complaint is severe Shoulders and hip pain Can't sleep, past several days has been terrible Scheduled to see Dr. Edilia May next week  Bowl of ice cream every night, poor diet in general, weight continues to run high No regular exercise program HBA1C 8 He does book work for [employer]   Wife does his medications Compliant with his medications, no complaints  Lab work reviewed with him  Total 124 HBA1C 8.3 LDL unable to calculate  EKG personally reviewed by myself on todays visit Shows normal sinus rhythm rate 68 bpm no significant ST or T wave changes  Other past medical history   presented to the hospital April 15 with pain similar to his previous anginal equivalent, scapular pain between his shoulder blades. He had cardiac catheterization April 18 that showed chronically occluded mid LAD, severe disease of the distal RCA as well as PDA branch. He had DES to the distal RCA. In the holding area, he developed chest discomfort and was  taken back to the cardiac catheterization lab that showed occluded PDA lesion which was stented. Discharged home on 11/01/2014   PMH:   has a past medical history of CAD (coronary artery disease), CKD (chronic kidney disease), COPD (chronic obstructive pulmonary disease) ([place]), DM2 (diabetes mellitus, type 2) ([place]), H/O hiatal hernia, Heart attack ([place]), HTN (hypertension), Hypercholesterolemia, Impaired fasting glucose, Osteoarthritis of hip, Osteoarthritis, knee, PAD (peripheral artery disease) ([place]), Peri-rectal abscess (12/27/2011), and Tobacco abuse.  PSH:    Past Surgical History:  Procedure Laterality Date  . CORONARY ANGIOPLASTY    . [place], 2006   Multiple, LAD in 1999, RCA in 2005  . FEMORAL-POPLITEAL BYPASS GRAFT  2016   L fem to below the knee popliteal vein bypass graft  . INCISE AND DRAIN ABCESS  2007   on scrotum  . INCISION AND DRAINAGE PERIRECTAL ABSCESS  12/28/2011   Procedure: IRRIGATION AND DEBRIDEMENT PERIRECTAL ABSCESS;  Surgeon: Brett Roof, MD;  Location: [facility];  Service: General;  Laterality: N/A;    Current Outpatient Medications  Medication Sig Dispense Refill  . albuterol (PROAIR HFA) 108 (90 BASE) MCG/ACT inhaler Inhale 2 puffs into the lungs 3 (three) times daily as needed (cough). 18 g 2  . aspirin 81 MG EC tablet Take 1 tablet (81 mg total) by mouth daily. 30 tablet 0  . atorvastatin (LIPITOR) 40 MG tablet Take 1 tablet (40 mg total) by mouth daily. 90 tablet 3  . Blood Glucose Monitoring Suppl (RELION  CONFIRM GLUCOSE MONITOR) W/DEVICE KIT Check blood sugar once daily as needed.  Diagnosis:  250.00  Non-insulin dependent 1 kit 0  . fenofibrate (TRICOR) 145 MG tablet Take 1 tablet (145 mg total) by mouth daily. 30 tablet 0  . glipiZIDE (GLUCOTROL) 5 MG tablet TAKE 1 TABLET BY MOUTH ONCE DAILY BEFORE BREAKFAST 90 tablet 0  . glucosamine-chondroitin 500-400 MG tablet Take 1 tablet by mouth 2 (two) times daily.    Marland Kitchen glucose blood  (RELION CONFIRM/MICRO TEST) test strip Use as instructed to test blood sugar once daily as needed.  Diagnosis:  250.00  Non insulin-dependent. 100 each 3  . hydrochlorothiazide (HYDRODIURIL) 25 MG tablet Take 1 tablet (25 mg total) by mouth daily. 30 tablet 0  . Insulin Glargine (LANTUS SOLOSTAR) 100 UNIT/ML Solostar Pen Inject up to 35 units per day 5 pen PRN  . Insulin Pen Needle (PEN NEEDLES) 31G X 6 MM MISC Use as directed with insulin pen 100 each 3  . lisinopril (PRINIVIL,ZESTRIL) 40 MG tablet Take 1 tablet (40 mg total) by mouth daily. 90 tablet 3  . meclizine (ANTIVERT) 25 MG tablet Take 25 mg by mouth every 6 (six) hours as needed. For dizziness/vertigo    . metFORMIN (GLUCOPHAGE) 500 MG tablet TAKE TWO TABLETS BY MOUTH TWICE DAILY WITH A MEAL 360 tablet 0  . metoprolol tartrate (LOPRESSOR) 25 MG tablet TAKE 1/2 (ONE-HALF) TABLET BY MOUTH TWICE DAILY 30 tablet 0  . Multiple Vitamins-Minerals (PRESERVISION AREDS 2 PO) Take 1 tablet by mouth 2 (two) times daily.    . nitroGLYCERIN (NITROSTAT) 0.4 MG SL tablet Place 1 tablet (0.4 mg total) under the tongue every 5 (five) minutes as needed. For chest pain 25 tablet 12  . RELION LANCETS MICRO-THIN 33G MISC Use to test blood sugar once daily as needed.  Diagnosis:  250.00   Non insulin dependent. 100 each 3  . SYMBICORT 160-4.5 MCG/ACT inhaler INHALE TWO PUFFS BY MOUTH ONCE DAILY IN THE MORNING 10.2 g 5  . ticagrelor (BRILINTA) 60 MG TABS tablet Take 1 tablet (60 mg total) by mouth 2 (two) times daily. 60 tablet 0  . traMADol (ULTRAM) 50 MG tablet Take 1 tablet (50 mg total) by mouth every 6 (six) hours as needed. 30 tablet 0   No current facility-administered medications for this visit.      Allergies:   Augmentin [amoxicillin-pot clavulanate] and Morphine and related   Social History:  The patient  reports that he quit smoking about 18 years ago. His smoking use included cigarettes. He has a 40.00 pack-year smoking history. he has never  used smokeless tobacco. He reports that he drinks alcohol. He reports that he does not use drugs.   Family History:   family history includes CAD in his mother; Diabetes in his mother; Heart failure in his father.    Review of Systems: Review of Systems  Constitutional: Negative.   Respiratory: Negative.   Cardiovascular: Negative.   Gastrointestinal: Negative.   Musculoskeletal: Positive for back pain and joint pain.  Neurological: Negative.   Psychiatric/Behavioral: Negative.   All other systems reviewed and are negative.    PHYSICAL EXAM: VS:  BP 104/64 (BP Location: Left Arm, Patient Position: Sitting, Cuff Size: Normal)   Pulse 68   Ht _0  (1.753 m)   Wt 207 lb (93.9 kg)   BMI 30.57 kg/m  , BMI Body mass index is 30.57 kg/m. GEN: Well nourished, well developed, in no acute distress , obese HEENT: normal  Neck: no JVD, carotid bruits, or masses Cardiac: RRR; no murmurs, rubs, or gallops,no edema  Respiratory:  Mildly decreased breath sounds throughout, expiratory wheezing,  normal work of breathing GI: soft, nontender, nondistended, + BS MS: no deformity or atrophy  Skin: warm and dry, no rash Neuro:  Strength and sensation are intact Psych: euthymic mood, full affect    Recent Labs: 09/04/2016: Hemoglobin 14.6; Platelets 161 03/28/2017: BUN 23; Creatinine, Ser 1.47; Potassium 4.0; Sodium 139    Lipid Panel Lab Results  Component Value Date   CHOL 124 09/03/2016   HDL 21 (L) 09/03/2016   LDLCALC UNABLE TO CALCULATE IF TRIGLYCERIDE OVER 400 mg/dL 09/03/2016   TRIG 596 (H) 09/03/2016      Wt Readings from Last 3 Encounters:  07/18/17 207 lb (93.9 kg)  07/10/17 207 lb (93.9 kg)  04/20/17 207 lb 8 oz (94.1 kg)       ASSESSMENT AND PLAN:  Essential hypertension - Plan: EKG 12-Lead Blood pressure is well controlled on today's visit. No changes made to the medications. Stable  Atherosclerosis of native coronary artery of native heart without angina  pectoris - Plan: EKG 12-Lead Currently with no symptoms of angina. No further workup at this time. Continue current medication regimen.  Stable  NSTEMI (non-ST elevated myocardial infarction) ([place]) - Plan: EKG 12-Lead Stressed importance of better diabetes control  Bilateral carotid artery disease ([place]) - Plan: EKG 12-Lead Recent carotid results discussed with him in detail Stressed the importance of aggressive diabetes control Less than 60% bilaterally  Hyperlipidemia Cholesterol is at goal on the current lipid regimen. No changes to the medications were made.  Stable  Type 2 diabetes, poorly controlled With complications  Diet discussed with him in detail Recommended low carbohydrate diet Needs regular exercise program  PAD: Followed at vein and vascular, annual basis Recent studies reviewed with him in detail, pulled up in the office  COPD: Currently on inhalers, reports his breathing is stable Some expiratory wheezing   Total encounter time more than 25 minutes  Greater than 50% was spent in counseling and coordination of care with the patient   Disposition:   F/U  12 months   Orders Placed This Encounter  Procedures  . EKG 12-Lead     Signed, Esmond Plants, M.D., Ph.D. 07/18/2017  [place], [place]

## 2017-07-17 ENCOUNTER — Telehealth: Payer: Self-pay | Admitting: Family Medicine

## 2017-07-17 ENCOUNTER — Telehealth: Payer: Self-pay

## 2017-07-17 NOTE — Telephone Encounter (Signed)
F/u ov, I have not seen in 3 mo

## 2017-07-17 NOTE — Telephone Encounter (Signed)
Copied from CRM [id number]. Topic: General - Other >> Jul 17, 2017 10:30 AM Golden, Tashia, RMA wrote: Reason for CRM: Pt would like a call back concerning next steps for pain in his shoulders pt stated that physical therapy did not really help Please call pt on cell at [phone]   

## 2017-07-17 NOTE — Telephone Encounter (Signed)
Copied from CRM [phone]. Topic: General - Other >> Jul 17, 2017 10:30 AM Lelon FrohlichGolden, Tashia, RMA wrote: Reason for CRM: Pt would like a call back concerning next steps for pain in his shoulders pt stated that physical therapy did not really help Please call pt on cell at [phone]

## 2017-07-17 NOTE — Telephone Encounter (Signed)
[name] notified as instructed by telephone. Appointment scheduled 07/24/2017 at 8:00 am with Dr. Patsy Lageropland to re-evaluate shoulder pain.

## 2017-07-18 ENCOUNTER — Ambulatory Visit (INDEPENDENT_AMBULATORY_CARE_PROVIDER_SITE_OTHER): Payer: Medicare Other | Admitting: Cardiovascular Disease

## 2017-07-18 VITALS — BP 104/64 | HR 68 | Ht 69.0 in | Wt 207.0 lb

## 2017-07-18 DIAGNOSIS — E1151 Type 2 diabetes mellitus with diabetic peripheral angiopathy without gangrene: Secondary | ICD-10-CM | POA: Diagnosis not present

## 2017-07-18 DIAGNOSIS — I724 Aneurysm of artery of lower extremity: Secondary | ICD-10-CM | POA: Diagnosis not present

## 2017-07-18 DIAGNOSIS — I10 Essential (primary) hypertension: Secondary | ICD-10-CM | POA: Diagnosis not present

## 2017-07-18 DIAGNOSIS — I6523 Occlusion and stenosis of bilateral carotid arteries: Secondary | ICD-10-CM

## 2017-07-18 DIAGNOSIS — I25118 Atherosclerotic heart disease of native coronary artery with other forms of angina pectoris: Secondary | ICD-10-CM

## 2017-07-18 DIAGNOSIS — I209 Angina pectoris, unspecified: Secondary | ICD-10-CM

## 2017-07-18 DIAGNOSIS — J432 Centrilobular emphysema: Secondary | ICD-10-CM

## 2017-07-18 DIAGNOSIS — I714 Abdominal aortic aneurysm, without rupture, unspecified: Secondary | ICD-10-CM

## 2017-07-18 DIAGNOSIS — E782 Mixed hyperlipidemia: Secondary | ICD-10-CM | POA: Diagnosis not present

## 2017-07-18 NOTE — Patient Instructions (Signed)

## 2017-07-18 NOTE — Telephone Encounter (Signed)
I need input from Dr. Patsy Lageropland.  Thanks.

## 2017-07-18 NOTE — Telephone Encounter (Signed)
We have scheduled him an appointment to follow up with Dr. Patsy Lageropland on 07/24/2017.

## 2017-07-18 NOTE — Telephone Encounter (Signed)
Thanks

## 2017-07-24 ENCOUNTER — Encounter: Payer: Self-pay | Admitting: Family Medicine

## 2017-07-24 ENCOUNTER — Other Ambulatory Visit: Payer: Self-pay

## 2017-07-24 ENCOUNTER — Ambulatory Visit (INDEPENDENT_AMBULATORY_CARE_PROVIDER_SITE_OTHER): Payer: Medicare Other | Admitting: Family Medicine

## 2017-07-24 VITALS — BP 118/60 | HR 62 | Temp 97.7°F | Ht 69.0 in | Wt 206.5 lb

## 2017-07-24 DIAGNOSIS — M19019 Primary osteoarthritis, unspecified shoulder: Secondary | ICD-10-CM | POA: Diagnosis not present

## 2017-07-24 DIAGNOSIS — M7062 Trochanteric bursitis, left hip: Secondary | ICD-10-CM | POA: Diagnosis not present

## 2017-07-24 DIAGNOSIS — M25551 Pain in right hip: Secondary | ICD-10-CM

## 2017-07-24 DIAGNOSIS — M7542 Impingement syndrome of left shoulder: Secondary | ICD-10-CM

## 2017-07-24 DIAGNOSIS — M25819 Other specified joint disorders, unspecified shoulder: Secondary | ICD-10-CM

## 2017-07-24 DIAGNOSIS — M7061 Trochanteric bursitis, right hip: Secondary | ICD-10-CM | POA: Diagnosis not present

## 2017-07-24 DIAGNOSIS — I6523 Occlusion and stenosis of bilateral carotid arteries: Secondary | ICD-10-CM

## 2017-07-24 DIAGNOSIS — M25552 Pain in left hip: Secondary | ICD-10-CM

## 2017-07-24 MED ORDER — METHYLPREDNISOLONE ACETATE 40 MG/ML IJ SUSP
80.0000 mg | Freq: Once | INTRAMUSCULAR | Status: AC
  Administered 2017-07-24: 80 mg via INTRA_ARTICULAR

## 2017-07-24 MED ORDER — CELECOXIB 200 MG PO CAPS: 200.0000 mg | ORAL_CAPSULE | Freq: Every day | ORAL | 3 refills | Status: DC

## 2017-07-24 NOTE — Progress Notes (Signed)
Dr. Frederico Hamman T. Aneliese Beaudry, MD, [facility] Medicine [place], [address] Phone: [phone] Fax: [phone]  07/24/2017  Patient: [name], MRN: [id number], DOB: Jun 12, 1946, 72 y.o.  Primary Physician:  Brett Ghent, MD   Chief Complaint  Patient presents with  . Shoulder Pain    Bilateral-Left is worse  . Hip Pain    Bilateral-Right is worse   Subjective:   [name] is a 72 y.o. very pleasant male patient who presents with the following:  F/u L shoulder.  Saw the patient 3 months ago for left-sided shoulder pain with some impingement, and at that point I did a subacromial injection and had the patient follow-up for formal physical therapy.  He called me and was having some ongoing shoulder pain.  I asked him to follow-up, and today he remarks that he actually is having bilateral shoulder pain, left worse than the right, and is also having some bilateral lateral hip pain.  His lateral hip pain is actually significantly worse compared to his shoulder pain currently.  L shoulder is twice as bad as the R shoulder. PT did not seem to help it at all - minimal OA on plain films.  On his plain films, the patient did have some AC joint arthropathy, but mild glenohumeral joint osteoarthritis.  He still has some pain with terminal motion.  B GTB injections.  He has had significant pain on the lateral aspect of his hips without any significant groin pain and without any significant back pain.  He has not had any numbness, tingling, and no significant weakness and no injury that he can recall.  Past Medical History, Surgical History, Social History, Family History, Problem List, Medications, and Allergies have been reviewed and updated if relevant.  Patient Active Problem List   Diagnosis Date Noted  . CAD (coronary artery disease), native coronary artery 07/16/2017  . Shoulder pain 12/30/2016  . Skin lesion 12/30/2016  .  Unstable angina ([facility]) 09/03/2016  . AAA (abdominal aortic aneurysm) without rupture ([facility]) 07/13/2016  . Popliteal artery aneurysm ([facility]) 07/13/2016  . Carotid artery disease ([facility]) 01/30/2015  . NSTEMI (non-ST elevated myocardial infarction) ([facility]) 11/16/2014  . Medicare annual wellness visit, initial 10/21/2014  . Advance care planning 10/21/2014  . PAD (peripheral artery disease) ([facility]) 09/09/2014  . COPD exacerbation ([facility]) 08/21/2014  . DM (diabetes mellitus), type 2 with peripheral vascular complications ([facility]) 39/09/90  . Back pain 02/24/2013  . Cough 01/26/2011  . COPD (chronic obstructive pulmonary disease) ([facility]) 12/26/2010  . FATIGUE 05/11/2009  . Essential hypertension 05/08/2009  . Hyperlipidemia 06/20/2008    Past Medical History:  Diagnosis Date  . CAD (coronary artery disease)    a. 1999: PCI-->LAD 2/2 MI; b. 2005: inf MI s/p PCI/DES x 4 to RCA; c. Myoview in 12/2005: EF 61%, no evidence for ischemia; d. cath 10/2014: occluded mLAD w/ L to L and L to R collats, dRCA 95% s/p PCI/DES 0%, mPDA 70%, LCx mild to mod irregs, procedure complicated by inf ST ele in recovery, repeat cath showed acute dRCA stent thrombosis o/w occluded mRPDA, PTCA dRCA, PCI/DES RPDA, aggrastat x 18 hr  . CKD (chronic kidney disease)   . COPD (chronic obstructive pulmonary disease) ([facility])   . DM2 (diabetes mellitus, type 2) ([facility])   . H/O hiatal hernia   . Heart attack ([facility])   . HTN (hypertension)   . Hypercholesterolemia   . Impaired fasting glucose  Elevated after steroid injection  . Osteoarthritis of hip   . Osteoarthritis, knee   . PAD (peripheral artery disease) ([facility])    L fem to below the knee popliteal vein bypass graft  . Peri-rectal abscess 12/27/2011  . Tobacco abuse    Prior    Past Surgical History:  Procedure Laterality Date  . CORONARY ANGIOPLASTY    . [place], 2006   Multiple, LAD in 1999, RCA in 2005  . FEMORAL-POPLITEAL BYPASS GRAFT  2016   L fem to  below the knee popliteal vein bypass graft  . INCISE AND DRAIN ABCESS  2007   on scrotum  . INCISION AND DRAINAGE PERIRECTAL ABSCESS  12/28/2011   Procedure: IRRIGATION AND DEBRIDEMENT PERIRECTAL ABSCESS;  Surgeon: Brett Roof, MD;  Location: [facility];  Service: General;  Laterality: N/A;    Social History   Socioeconomic History  . Marital status: Married    Spouse name: Not on file  . Number of children: 2  . Years of education: Not on file  . Highest education level: Not on file  Social Needs  . Financial resource strain: Not on file  . Food insecurity - worry: Not on file  . Food insecurity - inability: Not on file  . Transportation needs - medical: Not on file  . Transportation needs - non-medical: Not on file  Occupational History  . 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 Smoking status: Former Smoker    Packs/day: 1.00    Years: 40.00    Pack years: 40.00    Types: Cigarettes    Last attempt to quit: 10/21/1998    Years since quitting: 18.7  . Smokeless tobacco: Never Used  . Tobacco comment: Past smoker, states he will smoke maybe 1 cigarette/ month or so still  Substance and Sexual Activity  . Alcohol use: Yes    Alcohol/week: 0.0 oz    Comment: Occasional beer on the weekends  . Drug use: No  . Sexual activity: No  Other Topics Concern  . Not on file  Social History Narrative   Lives in [place]   Married 50+ years   2 grown daughters, 7 grandchildren, 4 great grandchildren   Designated Party Release Form signed on 01/19/10 appointing [name].     Family History  Problem Relation Age of Onset  . Heart failure Father        CHF  . CAD Mother   . Diabetes Mother   . Colon cancer Neg Hx   . Prostate cancer Neg Hx     Allergies  Allergen Reactions  . Augmentin [Amoxicillin-Pot Clavulanate] Other (See Comments)    Nausea,vomiting,diarrhea  . Morphine And Related Nausea And Vomiting    Vomiting, GI upset    Medication  list reviewed and updated in full in [facility].  GEN: No fevers, chills. Nontoxic. Primarily MSK c/o today. MSK: Detailed in the HPI GI: tolerating PO intake without difficulty Neuro: No numbness, parasthesias, or tingling associated. Otherwise the pertinent positives of the ROS are noted above.   Objective:   BP 118/60   Pulse 62   Temp 97.7 F (36.5 C) (Oral)   Ht 5' 9"  (1.753 m)   Wt 206 lb 8 oz (93.7 kg)   BMI 30.49 kg/m    GEN: WDWN, NAD, Non-toxic, Alert & Oriented x 3 HEENT: Atraumatic, Normocephalic.  Ears and Nose: No external deformity. EXTR: No clubbing/cyanosis/edema NEURO: Normal  gait.  PSYCH: Normally interactive. Conversant. Not depressed or anxious appearing.  Calm demeanor.   HIP EXAM: SIDE: b ROM: Abduction, Flexion, Internal and External range of motion: full Pain with terminal IROM and EROM: none GTB: B ttp SLR: NEG Knees: No effusion FABER: NT REVERSE FABER: NT, neg Piriformis: NT at direct palpation Str: flexion: 5/5 abduction: 5/5 adduction: 5/5 Strength testing non-tender  Shoulder: B Inspection: No muscle wasting or winging Ecchymosis/edema: neg  AC joint, scapula, clavicle: NT Cervical spine: ROM decreased approx 50% Abduction: full, 5/5 Flexion: full, 5/5 IR, to 45, lift-off: 5/5 ER at neutral: to 85, 5/5 AC crossover: L pos Neer: pos Hawkins: pos Drop Test: neg Empty Can: pos Supraspinatus insertion: mild-mod T Bicipital groove: NT Speed's: neg Yergason's: neg Sulcus sign: neg Scapular dyskinesis: none C5-T1 intact  Neuro: Sensation intact Grip 5/5   Radiology: Dg Shoulder Left  Result Date: 03/31/2017 CLINICAL DATA:  Left shoulder pain EXAM: LEFT SHOULDER - 2+ VIEW COMPARISON:  None. FINDINGS: Mild spurring along the inferior margin of the glenoid fossa. No osteophytes or other signs of advanced DJD at the left glenohumeral joint space. Left humeral head is normally positioned relative the glenoid fossa.  Additional degenerative spurring noted at the left acromioclavicular joint space. No acute or suspicious osseous finding. Soft tissues about the left shoulder are unremarkable. IMPRESSION: 1. No acute findings. 2. Mild spurring along the inferior margin of the glenoid fossa. This may indicate underlying labral injury. 3. Mild DJD at the left [facility] joint. Electronically Signed   By: Franki Cabot M.D.   On: 03/31/2017 20:58   Assessment and Plan:   Greater trochanteric bursitis of both hips  Bilateral hip pain - Plan: methylPREDNISolone acetate (DEPO-MEDROL) injection 80 mg, methylPREDNISolone acetate (DEPO-MEDROL) injection 80 mg  Tight posterior capsule of shoulder, unspecified laterality  Impingement syndrome of shoulder, left  AC joint arthropathy  I looked up interactions, and it appears to be no interactions between Celebrex and Brilinta.   Hip pain is is greater concern now.  The true hip joint actually moves remarkably well at 71, but he does have some lateral pain consistent on exam with trochanteric bursitis.  We will inject both of his hip to try to give him some relief.  Shoulder is a more challenging issue, warned of wear and tear and arthritis at the [facility] joint, glenohumeral joint, and he also has a quite tight capsule.  Not true frozen shoulder, and may be more of a component of arthritis, so changed his rehab program to try to work more on both posterior and anterior capsule.  Trochanteric Bursitis Injection, R Verbal consent obtained. Risks (including infection, potential atrophy), benefits, and alternatives reviewed. Greater trochanter sterilely prepped with Chloraprep. Ethyl Chloride used for anesthesia. 8 cc of Lidocaine 1% injected with 2 mL of Depo-Medrol 40 mg into trochanteric bursa at area of maximal tenderness at greater trochanter. Needle taken to bone to troch bursa, flows easily. Bursa massaged. No bleeding and no complications. Decreased pain after injection. Needle: 22  gauge spinal needle   Trochanteric Bursitis Injection, L Verbal consent obtained. Risks (including infection, potential atrophy), benefits, and alternatives reviewed. Greater trochanter sterilely prepped with Chloraprep. Ethyl Chloride used for anesthesia. 8 cc of Lidocaine 1% injected with 2 mL of Depo-Medrol 40 mg into trochanteric bursa at area of maximal tenderness at greater trochanter. Needle taken to bone to troch bursa, flows easily. Bursa massaged. No bleeding and no complications. Decreased pain after injection. Needle: 22 gauge spinal  needle   Follow-up: 6-8 weeks  Meds ordered this encounter  Medications  . celecoxib (CELEBREX) 200 MG capsule    Sig: Take 1 capsule (200 mg total) by mouth daily.    Dispense:  30 capsule    Refill:  3  . methylPREDNISolone acetate (DEPO-MEDROL) injection 80 mg  . methylPREDNISolone acetate (DEPO-MEDROL) injection 80 mg   Signed,  Endia Moncur T. Lantz Hermann, MD   Allergies as of 07/24/2017      Reactions   Augmentin [amoxicillin-pot Clavulanate] Other (See Comments)   Nausea,vomiting,diarrhea   Morphine And Related Nausea And Vomiting   Vomiting, GI upset      Medication List        Accurate as of 07/24/17 11:59 PM. Always use your most recent med list.          albuterol 108 (90 Base) MCG/ACT inhaler Commonly known as:  PROAIR HFA Inhale 2 puffs into the lungs 3 (three) times daily as needed (cough).   ANTIVERT 25 MG tablet Generic drug:  meclizine Take 25 mg by mouth every 6 (six) hours as needed. For dizziness/vertigo   aspirin 81 MG EC tablet Take 1 tablet (81 mg total) by mouth daily.   atorvastatin 40 MG tablet Commonly known as:  LIPITOR Take 1 tablet (40 mg total) by mouth daily.   celecoxib 200 MG capsule Commonly known as:  CELEBREX Take 1 capsule (200 mg total) by mouth daily.   fenofibrate 145 MG tablet Commonly known as:  TRICOR Take 1 tablet (145 mg total) by mouth daily.   glipiZIDE 5 MG tablet Commonly  known as:  GLUCOTROL TAKE 1 TABLET BY MOUTH ONCE DAILY BEFORE BREAKFAST   glucosamine-chondroitin 500-400 MG tablet Take 1 tablet by mouth 2 (two) times daily.   glucose blood test strip Commonly known as:  RELION CONFIRM/MICRO TEST Use as instructed to test blood sugar once daily as needed.  Diagnosis:  250.00  Non insulin-dependent.   hydrochlorothiazide 25 MG tablet Commonly known as:  HYDRODIURIL Take 1 tablet (25 mg total) by mouth daily.   Insulin Glargine 100 UNIT/ML Solostar Pen Commonly known as:  LANTUS SOLOSTAR Inject up to 35 units per day   lisinopril 40 MG tablet Commonly known as:  PRINIVIL,ZESTRIL Take 1 tablet (40 mg total) by mouth daily.   metFORMIN 500 MG tablet Commonly known as:  GLUCOPHAGE TAKE TWO TABLETS BY MOUTH TWICE DAILY WITH A MEAL   metoprolol tartrate 25 MG tablet Commonly known as:  LOPRESSOR TAKE 1/2 (ONE-HALF) TABLET BY MOUTH TWICE DAILY   nitroGLYCERIN 0.4 MG SL tablet Commonly known as:  NITROSTAT Place 1 tablet (0.4 mg total) under the tongue every 5 (five) minutes as needed. For chest pain   Pen Needles 31G X 6 MM Misc Use as directed with insulin pen   PRESERVISION AREDS 2 PO Take 1 tablet by mouth 2 (two) times daily.   RELION CONFIRM GLUCOSE MONITOR w/Device Kit Check blood sugar once daily as needed.  Diagnosis:  250.00  Non-insulin dependent   RELION LANCETS MICRO-THIN 33G Misc Use to test blood sugar once daily as needed.  Diagnosis:  250.00   Non insulin dependent.   SYMBICORT 160-4.5 MCG/ACT inhaler Generic drug:  budesonide-formoterol INHALE TWO PUFFS BY MOUTH ONCE DAILY IN THE MORNING   ticagrelor 60 MG Tabs tablet Commonly known as:  BRILINTA Take 1 tablet (60 mg total) by mouth 2 (two) times daily.   ULTRAM 50 MG tablet Generic drug:  traMADol Take 1 tablet (50  mg total) by mouth every 6 (six) hours as needed.

## 2017-08-04 ENCOUNTER — Telehealth: Payer: Self-pay | Admitting: Family Medicine

## 2017-08-04 NOTE — Telephone Encounter (Signed)
Patient requesting a refill on Insulin Pen Needle (Pen needles)  31G X MM MISC  LOV  07/24/17 NOV  09/25/17  [facility]erred pharmacy   [facility] [address] [place]  [phone] FAX  [phone]

## 2017-08-04 NOTE — Telephone Encounter (Signed)
Copied from CRM [id number]. Topic: Quick Communication - Rx Refill/Question >> Aug 04, 2017  2:11 PM Gloriann LoanPayne, Brantlee Hinde L wrote: Medication: Insulin Pen Needle (PEN NEEDLES) 31G X 6 MM MISC   Has the patient contacted their pharmacy? Yes.     (Agent: If no, request that the patient contact the pharmacy for the refill.)   Preferred Pharmacy (with phone number or street name): [facility] [address] [facility] [phone]  [facility] [place] [place] - [address], [address] [phone] (Phone) [phone] (Fax)  has closed it doors and [facility]   Agent: Please be advised that RX refills may take up to 3 business days. We ask that you follow-up with your pharmacy.

## 2017-08-08 ENCOUNTER — Other Ambulatory Visit: Payer: Self-pay | Admitting: *Deleted

## 2017-08-08 MED ORDER — INSULIN PEN NEEDLE 31G X 5 MM MISC: 3 refills | Status: DC

## 2017-08-14 ENCOUNTER — Other Ambulatory Visit: Payer: Self-pay | Admitting: *Deleted

## 2017-08-14 ENCOUNTER — Other Ambulatory Visit: Payer: Self-pay

## 2017-08-14 MED ORDER — TICAGRELOR 60 MG PO TABS: 60.0000 mg | ORAL_TABLET | Freq: Two times a day (BID) | ORAL | 6 refills | Status: DC

## 2017-08-14 MED ORDER — GLIPIZIDE 5 MG PO TABS: ORAL_TABLET | ORAL | 1 refills | Status: DC

## 2017-08-15 ENCOUNTER — Other Ambulatory Visit: Payer: Self-pay | Admitting: Family Medicine

## 2017-08-15 ENCOUNTER — Other Ambulatory Visit: Payer: Self-pay | Admitting: *Deleted

## 2017-08-15 MED ORDER — METOPROLOL TARTRATE 25 MG PO TABS: ORAL_TABLET | ORAL | 3 refills | Status: DC

## 2017-08-15 NOTE — Telephone Encounter (Signed)
I spoke with [name] (DPR signed) [facility]. [name] said just got notice from [facility] [place] that glipizide is ready for pick up. Nothing further needed per pt.

## 2017-08-15 NOTE — Telephone Encounter (Signed)
Copied from CRM [id number]. Topic: Quick Communication - Rx Refill/Question >> Aug 15, 2017 12:32 PM Jonette EvaBarksdale, Harvey B wrote: Medication: glipiZIDE (GLUCOTROL) 5 MG tablet [[id number]][[id number]]    Has the patient contacted their pharmacy? Yes.     (Agent: If no, request that the patient contact the pharmacy for the refill.)   [facility]erred Pharm[facility]with phone number [facility] (wait[facility] for [facility] to be [facility])   Agent: Please be advised that RX refills may take up to 3 business days. We ask that you follow-up with your pharmacy.

## 2017-08-21 ENCOUNTER — Other Ambulatory Visit: Payer: Self-pay

## 2017-08-21 MED ORDER — HYDROCHLOROTHIAZIDE 25 MG PO TABS: 25.0000 mg | ORAL_TABLET | Freq: Every day | ORAL | 3 refills | Status: DC

## 2017-08-24 ENCOUNTER — Telehealth: Payer: Self-pay

## 2017-08-24 NOTE — Telephone Encounter (Signed)
He had a complicated case with 4+ joints involved when I saw him last. I am happy to see him early next week to reassess the situation.

## 2017-08-24 NOTE — Telephone Encounter (Signed)
Copied from CRM [id number]. Topic: Inquiry >> Aug 24, 2017  1:35 PM [name], [name] wrote: Reason for CRM: pt called and states he saw Dr. Patsy Lageropland and received a cortisone shot and his right hip is still hurting him, pt wants to know if there are other options for him, contact pt to advise

## 2017-08-24 NOTE — Telephone Encounter (Signed)
Pt last seen 07/24/17 and has f/u appt on 09/25/17.

## 2017-08-25 NOTE — Telephone Encounter (Signed)
Appointment scheduled 08/30/2017 at 8:00 am with Dr. Patsy Lageropland to re-evaluate hip pain.

## 2017-08-28 DIAGNOSIS — E113393 Type 2 diabetes mellitus with moderate nonproliferative diabetic retinopathy without macular edema, bilateral: Secondary | ICD-10-CM | POA: Diagnosis not present

## 2017-08-30 ENCOUNTER — Ambulatory Visit (INDEPENDENT_AMBULATORY_CARE_PROVIDER_SITE_OTHER): Payer: Medicare Other | Admitting: Family Medicine

## 2017-08-30 ENCOUNTER — Encounter: Payer: Self-pay | Admitting: Family Medicine

## 2017-08-30 ENCOUNTER — Other Ambulatory Visit: Payer: Self-pay

## 2017-08-30 VITALS — BP 110/60 | HR 67 | Temp 98.2°F | Ht 69.0 in | Wt 208.5 lb

## 2017-08-30 DIAGNOSIS — M7061 Trochanteric bursitis, right hip: Secondary | ICD-10-CM | POA: Diagnosis not present

## 2017-08-30 DIAGNOSIS — I6523 Occlusion and stenosis of bilateral carotid arteries: Secondary | ICD-10-CM | POA: Diagnosis not present

## 2017-08-30 MED ORDER — METHYLPREDNISOLONE ACETATE 40 MG/ML IJ SUSP
80.0000 mg | Freq: Once | INTRAMUSCULAR | Status: AC
  Administered 2017-08-30: 80 mg via INTRA_ARTICULAR

## 2017-08-30 NOTE — Progress Notes (Signed)
Dr. Frederico Hamman T. Cassy Sprowl, MD, [facility] Medicine [place], [address] Phone: [phone] Fax: [phone]  08/30/2017  Patient: [name], MRN: [id number], DOB: 04-29-1946, 72 y.o.  Primary Physician:  Tonia Ghent, MD   Chief Complaint  Patient presents with  . Follow-up    Right Hip Pain   Subjective:   [name] is a 72 y.o. very pleasant male patient who presents with the following:  GTB R hip: I saw the patient about a month ago, at that point he was having bilateral shoulder pain, bilateral trochanteric bursitis as well as other complaints.  Now he is basically only complaining about his right hip and trochanteric bursa.  The left is completely resolved.  Shoulders are doing better.  His right hip is about 50% better, but is not up to his satisfaction and he still symptomatic a lot of the time.  Left hip is completely better.   Down from 51 to a 5.  R GTB inj  Past Medical History, Surgical History, Social History, Family History, Problem List, Medications, and Allergies have been reviewed and updated if relevant.  Patient Active Problem List   Diagnosis Date Noted  . CAD (coronary artery disease), native coronary artery 07/16/2017  . Shoulder pain 12/30/2016  . Skin lesion 12/30/2016  . Unstable angina ([place]) 09/03/2016  . AAA (abdominal aortic aneurysm) without rupture ([place]) 07/13/2016  . Popliteal artery aneurysm ([place]) 07/13/2016  . Carotid artery disease ([place]) 01/30/2015  . NSTEMI (non-ST elevated myocardial infarction) ([place]) 11/16/2014  . Medicare annual wellness visit, initial 10/21/2014  . Advance care planning 10/21/2014  . PAD (peripheral artery disease) ([place]) 09/09/2014  . COPD exacerbation ([place]) 08/21/2014  . DM (diabetes mellitus), type 2 with peripheral vascular complications ([place]) 04/08/5746  . Back pain 02/24/2013  . Cough 01/26/2011  . COPD (chronic obstructive pulmonary disease)  ([place]) 12/26/2010  . FATIGUE 05/11/2009  . Essential hypertension 05/08/2009  . Hyperlipidemia 06/20/2008    Past Medical History:  Diagnosis Date  . CAD (coronary artery disease)    a. 1999: PCI-->LAD 2/2 MI; b. 2005: inf MI s/p PCI/DES x 4 to RCA; c. Myoview in 12/2005: EF 61%, no evidence for ischemia; d. cath 10/2014: occluded mLAD w/ L to L and L to R collats, dRCA 95% s/p PCI/DES 0%, mPDA 70%, LCx mild to mod irregs, procedure complicated by inf ST ele in recovery, repeat cath showed acute dRCA stent thrombosis o/w occluded mRPDA, PTCA dRCA, PCI/DES RPDA, aggrastat x 18 hr  . CKD (chronic kidney disease)   . COPD (chronic obstructive pulmonary disease) ([place])   . DM2 (diabetes mellitus, type 2) ([place])   . H/O hiatal hernia   . Heart attack ([place])   . HTN (hypertension)   . Hypercholesterolemia   . Impaired fasting glucose    Elevated after steroid injection  . Osteoarthritis of hip   . Osteoarthritis, knee   . PAD (peripheral artery disease) (H[facility])    L fem to below the knee popliteal vein bypass graft  . Peri-rectal abscess 12/27/2011  . Tobacco abuse    Prior    Past Surgical History:  Procedure Laterality Date  . CORONARY ANGIOPLASTY    . [place], 2006   Multiple, LAD in 1999, RCA in 2005  . FEMORAL-POPLITEAL BYPASS GRAFT  2016   L fem to below the knee popliteal vein bypass graft  . INCISE AND DRAIN ABCESS  2007   on scrotum  . INCISION AND DRAINAGE PERIRECTAL ABSCESS  12/28/2011   Procedure: IRRIGATION AND DEBRIDEMENT PERIRECTAL ABSCESS;  Surgeon: Merrie Roof, MD;  Location: [facility];  Service: General;  Laterality: N/A;    Social History   Socioeconomic History  . Marital status: Married    Spouse name: Not on file  . Number of children: 2  . Years of education: Not on file  . Highest education level: Not on file  Social Needs  . Financial resource strain: Not on file  . Food insecurity - worry: Not on file  . Food insecurity - inability:  Not on file  . Transportation needs - medical: Not on file  . Transportation needs - non-medical: Not on file  Occupational History  . 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 Smoking status: Former Smoker    Packs/day: 1.00    Years: 40.00    Pack years: 40.00    Types: Cigarettes    Last attempt to quit: 10/21/1998    Years since quitting: 18.8  . Smokeless tobacco: Never Used  . Tobacco comment: Past smoker, states he will smoke maybe 1 cigarette/ month or so still  Substance and Sexual Activity  . Alcohol use: Yes    Alcohol/week: 0.0 oz    Comment: Occasional beer on the weekends  . Drug use: No  . Sexual activity: No  Other Topics Concern  . Not on file  Social History Narrative   Lives in [place]   Married 50+ years   2 grown daughters, 7 grandchildren, 4 great grandchildren   Designated Party Release Form signed on 01/19/10 appointing Gerhard Munch.     Family History  Problem Relation Age of Onset  . Heart failure Father        CHF  . CAD Mother   . Diabetes Mother   . Colon cancer Neg Hx   . Prostate cancer Neg Hx     Allergies  Allergen Reactions  . Augmentin [Amoxicillin-Pot Clavulanate] Other (See Comments)    Nausea,vomiting,diarrhea  . Morphine And Related Nausea And Vomiting    Vomiting, GI upset    Medication list reviewed and updated in full in [place].  GEN: No fevers, chills. Nontoxic. Primarily MSK c/o today. MSK: Detailed in the HPI GI: tolerating PO intake without difficulty Neuro: No numbness, parasthesias, or tingling associated. Otherwise the pertinent positives of the ROS are noted above.   Objective:   BP 110/60   Pulse 67   Temp 98.2 F (36.8 C) (Oral)   Ht _0  (1.753 m)   Wt 208 lb 8 oz (94.6 kg)   BMI 30.79 kg/m    GEN: WDWN, NAD, Non-toxic, Alert & Oriented x 3 HEENT: Atraumatic, Normocephalic.  Ears and Nose: No external deformity. EXTR: No clubbing/cyanosis/edema NEURO: Normal  gait. R PSYCH: Normally interactive. Conversant. Not depressed or anxious appearing.  Calm demeanor.   HIP EXAM: SIDE: R ROM: Abduction, Flexion, Internal and External range of motion: full Pain with terminal IROM and EROM: no GTB: + SLR: NEG Knees: No effusion FABER: NT REVERSE FABER: NT, neg Piriformis: NT at direct palpation Str: flexion: 5/5 abduction: 5/5 adduction: 5/5 Strength testing non-tender     Radiology: No results found.  Assessment and Plan:   Trochanteric bursitis, right hip - Plan: methylPREDNISolone acetate (DEPO-MEDROL) injection 80 mg  I reviewed with the patient the structures involved and how they related to diagnosis. Reasonable  to reinject the R.  Patient was given a rehabilitation protocol emphasizing core rehab, partcularly addressing abductor weakness, and stretching of the pelvic musculature, hips, and ITB.  Trochanteric Bursitis Injection, R Verbal consent obtained. Risks (including infection, potential atrophy), benefits, and alternatives reviewed. Greater trochanter sterilely prepped with Chloraprep. Ethyl Chloride used for anesthesia. 8 cc of Lidocaine 1% injected with 2 mL of Depo-Medrol 40 mg into trochanteric bursa at area of maximal tenderness at greater trochanter. Needle taken to bone to troch bursa, flows easily. Bursa massaged. No bleeding and no complications. Decreased pain after injection. Needle: 22 gauge spinal needle   Follow-up: No Follow-up on file.  Meds ordered this encounter  Medications  . methylPREDNISolone acetate (DEPO-MEDROL) injection 80 mg   Signed,  Dorsey Authement T. Esaiah Wanless, MD   Allergies as of 08/30/2017      Reactions   Augmentin [amoxicillin-pot Clavulanate] Other (See Comments)   Nausea,vomiting,diarrhea   Morphine And Related Nausea And Vomiting   Vomiting, GI upset      Medication List        Accurate as of 08/30/17 11:59 PM. Always use your most recent med list.          albuterol 108 (90 Base)  MCG/ACT inhaler Commonly known as:  PROAIR HFA Inhale 2 puffs into the lungs 3 (three) times daily as needed (cough).   ANTIVERT 25 MG tablet Generic drug:  meclizine Take 25 mg by mouth every 6 (six) hours as needed. For dizziness/vertigo   aspirin 81 MG EC tablet Take 1 tablet (81 mg total) by mouth daily.   atorvastatin 40 MG tablet Commonly known as:  LIPITOR Take 1 tablet (40 mg total) by mouth daily.   celecoxib 200 MG capsule Commonly known as:  CELEBREX Take 1 capsule (200 mg total) by mouth daily.   fenofibrate 145 MG tablet Commonly known as:  TRICOR Take 1 tablet (145 mg total) by mouth daily.   glipiZIDE 5 MG tablet Commonly known as:  GLUCOTROL TAKE 1 TABLET BY MOUTH ONCE DAILY BEFORE BREAKFAST   glucosamine-chondroitin 500-400 MG tablet Take 1 tablet by mouth 2 (two) times daily.   glucose blood test strip Commonly known as:  RELION CONFIRM/MICRO TEST Use as instructed to test blood sugar once daily as needed.  Diagnosis:  250.00  Non insulin-dependent.   hydrochlorothiazide 25 MG tablet Commonly known as:  HYDRODIURIL Take 1 tablet (25 mg total) by mouth daily.   Insulin Glargine 100 UNIT/ML Solostar Pen Commonly known as:  LANTUS SOLOSTAR Inject up to 35 units per day   Insulin Pen Needle 31G X 5 MM Misc Commonly known as:  B-D UF III MINI PEN NEEDLES Use as instructed to inject insulin.  Diagnosis:  250.00  Insulin dependent.   lisinopril 40 MG tablet Commonly known as:  PRINIVIL,ZESTRIL Take 1 tablet (40 mg total) by mouth daily.   metFORMIN 500 MG tablet Commonly known as:  GLUCOPHAGE TAKE TWO TABLETS BY MOUTH TWICE DAILY WITH A MEAL   metoprolol tartrate 25 MG tablet Commonly known as:  LOPRESSOR TAKE 1/2 (ONE-HALF) TABLET BY MOUTH TWICE DAILY   nitroGLYCERIN 0.4 MG SL tablet Commonly known as:  NITROSTAT Place 1 tablet (0.4 mg total) under the tongue every 5 (five) minutes as needed. For chest pain   PRESERVISION AREDS 2 PO Take 1  tablet by mouth 2 (two) times daily.   RELION CONFIRM GLUCOSE MONITOR w/Device Kit Check blood sugar once daily as needed.  Diagnosis:  250.00  Non-insulin dependent  RELION LANCETS MICRO-THIN 33G Misc Use to test blood sugar once daily as needed.  Diagnosis:  250.00   Non insulin dependent.   SYMBICORT 160-4.5 MCG/ACT inhaler Generic drug:  budesonide-formoterol INHALE TWO PUFFS BY MOUTH ONCE DAILY IN THE MORNING   ticagrelor 60 MG Tabs tablet Commonly known as:  BRILINTA Take 1 tablet (60 mg total) by mouth 2 (two) times daily.   ULTRAM 50 MG tablet Generic drug:  traMADol Take 1 tablet (50 mg total) by mouth every 6 (six) hours as needed.

## 2017-09-25 ENCOUNTER — Other Ambulatory Visit: Payer: Self-pay

## 2017-09-25 ENCOUNTER — Encounter: Payer: Self-pay | Admitting: Family Medicine

## 2017-09-25 ENCOUNTER — Ambulatory Visit (INDEPENDENT_AMBULATORY_CARE_PROVIDER_SITE_OTHER)
Admission: RE | Admit: 2017-09-25 | Discharge: 2017-09-25 | Disposition: A | Discharge status: Home / Self Care | Payer: Medicare Other | Source: Ambulatory Visit | Attending: Family Medicine | Admitting: Family Medicine

## 2017-09-25 ENCOUNTER — Ambulatory Visit (INDEPENDENT_AMBULATORY_CARE_PROVIDER_SITE_OTHER): Payer: Medicare Other | Admitting: Family Medicine

## 2017-09-25 VITALS — BP 110/60 | HR 62 | Temp 97.8°F | Ht 69.0 in | Wt 208.5 lb

## 2017-09-25 DIAGNOSIS — M25551 Pain in right hip: Secondary | ICD-10-CM | POA: Diagnosis not present

## 2017-09-25 DIAGNOSIS — M7061 Trochanteric bursitis, right hip: Secondary | ICD-10-CM

## 2017-09-25 DIAGNOSIS — I6523 Occlusion and stenosis of bilateral carotid arteries: Secondary | ICD-10-CM

## 2017-09-25 MED ORDER — METHYLPREDNISOLONE ACETATE 40 MG/ML IJ SUSP
120.0000 mg | Freq: Once | INTRAMUSCULAR | Status: AC
  Administered 2017-09-25: 120 mg via INTRA_ARTICULAR

## 2017-09-25 NOTE — Progress Notes (Signed)
Dr. Frederico Hamman T. Mecca Barga, MD, [facility] [place], [address] Phone: [phone] Fax: [phone]  09/25/2017  Patient: [name], MRN: [id number], DOB: 01-17-1946, 72 y.o.  Primary Physician:  Tonia Ghent, MD   Chief Complaint  Patient presents with  . Follow-up    right hip-still hurting after 2 injections   Subjective:   [name] is a 72 y.o. very pleasant male patient who presents with the following:  Patient returns again, saw him one month ago with ongoing lateral hip pain consistent with trochanteric bursitis.  She had no significant osteoarthritis of the hip. Body mass index is 30.79 kg/m.  I done to trochanteric bursa injections on the patient.  His anatomy is quite easy to palpate.  He is at least 50% better compared to when I initially saw him, but he is not happy with the outcome on his right hip, because his left hip is 100% better.  Past Medical History, Surgical History, Social History, Family History, Problem List, Medications, and Allergies have been reviewed and updated if relevant.  Patient Active Problem List   Diagnosis Date Noted  . CAD (coronary artery disease), native coronary artery 07/16/2017  . Shoulder pain 12/30/2016  . Skin lesion 12/30/2016  . Unstable angina ([place]) 09/03/2016  . AAA (abdominal aortic aneurysm) without rupture ([place]) 07/13/2016  . Popliteal artery aneurysm ([place]) 07/13/2016  . Carotid artery disease ([place]) 01/30/2015  . NSTEMI (non-ST elevated myocardial infarction) ([place]) 11/16/2014  . Medicare annual wellness visit, initial 10/21/2014  . Advance care planning 10/21/2014  . PAD (peripheral artery disease) ([place]) 09/09/2014  . COPD exacerbation ([place]) 08/21/2014  . DM (diabetes mellitus), type 2 with peripheral vascular complications ([place]) 75/64/3329  . Back pain 02/24/2013  . Cough 01/26/2011  . COPD (chronic obstructive pulmonary disease) ([place])  12/26/2010  . FATIGUE 05/11/2009  . Essential hypertension 05/08/2009  . Hyperlipidemia 06/20/2008    Past Medical History:  Diagnosis Date  . CAD (coronary artery disease)    a. 1999: PCI-->LAD 2/2 MI; b. 2005: inf MI s/p PCI/DES x 4 to RCA; c. Myoview in 12/2005: EF 61%, no evidence for ischemia; d. cath 10/2014: occluded mLAD w/ L to L and L to R collats, dRCA 95% s/p PCI/DES 0%, mPDA 70%, LCx mild to mod irregs, procedure complicated by inf ST ele in recovery, repeat cath showed acute dRCA stent thrombosis o/w occluded mRPDA, PTCA dRCA, PCI/DES RPDA, aggrastat x 18 hr  . CKD (chronic kidney disease)   . COPD (chronic obstructive pulmonary disease) ([place])   . DM2 (diabetes mellitus, type 2) ([place])   . H/O hiatal hernia   . Heart attack ([place])   . HTN (hypertension)   . Hypercholesterolemia   . Impaired fasting glucose    Elevated after steroid injection  . Osteoarthritis of hip   . Osteoarthritis, knee   . PAD (peripheral artery disease) ([facility])    L fem to below the knee popliteal vein bypass graft  . Peri-rectal abscess 12/27/2011  . Tobacco abuse    Prior    Past Surgical History:  Procedure Laterality Date  . CORONARY ANGIOPLASTY    . [place], 2006   Multiple, LAD in 1999, RCA in 2005  . FEMORAL-POPLITEAL BYPASS GRAFT  2016   L fem to below the knee popliteal vein bypass graft  . INCISE AND DRAIN ABCESS  2007   on scrotum  . INCISION  AND DRAINAGE PERIRECTAL ABSCESS  12/28/2011   Procedure: IRRIGATION AND DEBRIDEMENT PERIRECTAL ABSCESS;  Surgeon: Merrie Roof, MD;  Location: [facility];  Service: General;  Laterality: N/A;    Social History   Socioeconomic History  . Marital status: Married    Spouse name: Not on file  . Number of children: 2  . Years of education: Not on file  . Highest education level: Not on file  Social Needs  . Financial resource strain: Not on file  . Food insecurity - worry: Not on file  . Food insecurity - inability: Not  on file  . Transportation needs - medical: Not on file  . Transportation needs - non-medical: Not on file  Occupational History  . 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 Smoking status: Former Smoker    Packs/day: 1.00    Years: 40.00    Pack years: 40.00    Types: Cigarettes    Last attempt to quit: 10/21/1998    Years since quitting: 18.9  . Smokeless tobacco: Never Used  . Tobacco comment: Past smoker, states he will smoke maybe 1 cigarette/ month or so still  Substance and Sexual Activity  . Alcohol use: Yes    Alcohol/week: 0.0 oz    Comment: Occasional beer on the weekends  . Drug use: No  . Sexual activity: No  Other Topics Concern  . Not on file  Social History Narrative   Lives in [place]   Married 50+ years   2 grown daughters, 7 grandchildren, 4 great grandchildren   Designated Party Release Form signed on 01/19/10 appointing Gerhard Munch.     Family History  Problem Relation Age of Onset  . Heart failure Father        CHF  . CAD Mother   . Diabetes Mother   . Colon cancer Neg Hx   . Prostate cancer Neg Hx     Allergies  Allergen Reactions  . Augmentin [Amoxicillin-Pot Clavulanate] Other (See Comments)    Nausea,vomiting,diarrhea  . Morphine And Related Nausea And Vomiting    Vomiting, GI upset    Medication list reviewed and updated in full in [place].   GEN: No acute illnesses, no fevers, chills. GI: No n/v/d, eating normally Pulm: No SOB Interactive and getting along well at home.  Otherwise, ROS is as per the HPI.  Objective:   BP 110/60   Pulse 62   Temp 97.8 F (36.6 C) (Oral)   Ht 5' 9"  (1.753 m)   Wt 208 lb 8 oz (94.6 kg)   BMI 30.79 kg/m    GEN: WDWN, NAD, Non-toxic, Alert & Oriented x 3 HEENT: Atraumatic, Normocephalic.  Ears and Nose: No external deformity. EXTR: No clubbing/cyanosis/edema NEURO: Normal gait.  PSYCH: Normally interactive. Conversant. Not depressed or anxious appearing.   Calm demeanor.   HIP EXAM: SIDE: R ROM: Abduction, Flexion, Internal and External range of motion: full Pain with terminal IROM and EROM: none GTB: + SLR: NEG Knees: No effusion FABER: NT REVERSE FABER: NT, neg Piriformis: NT at direct palpation Str: flexion: 5/5 abduction: 5/5 adduction: 5/5 Strength testing non-tender  Laboratory and Imaging Data: Dg Hip Unilat With Pelvis 2-3 Views Right  Result Date: 09/25/2017 CLINICAL DATA:  Right lateral hip pain for the past 3-4 months. EXAM: DG HIP (WITH OR WITHOUT PELVIS) 2-3V RIGHT COMPARISON:  None. FINDINGS: No acute fracture or dislocation. The hip joint spaces are preserved. Pubic symphysis and sacroiliac  joints are intact. Bone mineralization is normal. Atherosclerotic vascular calcifications. IMPRESSION: 1. No acute osseous abnormality or significant degenerative changes. Electronically Signed   By: Titus Dubin M.D.   On: 09/25/2017 09:16     Assessment and Plan:   Trochanteric bursitis, right hip  Right hip pain - Plan: DG HIP UNILAT WITH PELVIS 2-3 VIEWS RIGHT, methylPREDNISolone acetate (DEPO-MEDROL) injection 120 mg  Recalcitrant trochanteric bursitis on the right.  Recommended that the patient go to physical therapy, which has good evidence, but he declined this recommendation.  Intermittently doing some home rehab.  He is already taking Celebrex and doing some Ultram as well.  Leg is not unreasonable to do a another injection, and I am going to repeat at this time with 120 mg of Depo-Medrol.  Trochanteric Bursitis Injection, R Verbal consent obtained. Risks (including infection, potential atrophy), benefits, and alternatives reviewed. Greater trochanter sterilely prepped with Chloraprep. Ethyl Chloride used for anesthesia. 7 cc of Lidocaine 1% injected with 3 mL of Depo-Medrol 40 mg into trochanteric bursa at area of maximal tenderness at greater trochanter. Needle taken to bone to troch bursa, flows easily. Bursa  massaged. No bleeding and no complications. Decreased pain after injection. Needle: 22 gauge, 2 inch  Follow-up: No Follow-up on file.  Meds ordered this encounter  Medications  . methylPREDNISolone acetate (DEPO-MEDROL) injection 120 mg   There are no discontinued medications. Orders Placed This Encounter  Procedures  . DG HIP UNILAT WITH PELVIS 2-3 VIEWS RIGHT    Signed,  Laurena Valko T. Braleigh Massoud, MD   Allergies as of 09/25/2017      Reactions   Augmentin [amoxicillin-pot Clavulanate] Other (See Comments)   Nausea,vomiting,diarrhea   Morphine And Related Nausea And Vomiting   Vomiting, GI upset      Medication List        Accurate as of 09/25/17 11:59 PM. Always use your most recent med list.          albuterol 108 (90 Base) MCG/ACT inhaler Commonly known as:  PROAIR HFA Inhale 2 puffs into the lungs 3 (three) times daily as needed (cough).   ANTIVERT 25 MG tablet Generic drug:  meclizine Take 25 mg by mouth every 6 (six) hours as needed. For dizziness/vertigo   aspirin 81 MG EC tablet Take 1 tablet (81 mg total) by mouth daily.   atorvastatin 40 MG tablet Commonly known as:  LIPITOR Take 1 tablet (40 mg total) by mouth daily.   celecoxib 200 MG capsule Commonly known as:  CELEBREX Take 1 capsule (200 mg total) by mouth daily.   fenofibrate 145 MG tablet Commonly known as:  TRICOR Take 1 tablet (145 mg total) by mouth daily.   glipiZIDE 5 MG tablet Commonly known as:  GLUCOTROL TAKE 1 TABLET BY MOUTH ONCE DAILY BEFORE BREAKFAST   glucosamine-chondroitin 500-400 MG tablet Take 1 tablet by mouth 2 (two) times daily.   glucose blood test strip Commonly known as:  RELION CONFIRM/MICRO TEST Use as instructed to test blood sugar once daily as needed.  Diagnosis:  250.00  Non insulin-dependent.   hydrochlorothiazide 25 MG tablet Commonly known as:  HYDRODIURIL Take 1 tablet (25 mg total) by mouth daily.   Insulin Glargine 100 UNIT/ML Solostar Pen Commonly  known as:  LANTUS SOLOSTAR Inject up to 35 units per day   Insulin Pen Needle 31G X 5 MM Misc Commonly known as:  B-D UF III MINI PEN NEEDLES Use as instructed to inject insulin.  Diagnosis:  250.00  Insulin dependent.   lisinopril 40 MG tablet Commonly known as:  PRINIVIL,ZESTRIL Take 1 tablet (40 mg total) by mouth daily.   metFORMIN 500 MG tablet Commonly known as:  GLUCOPHAGE TAKE TWO TABLETS BY MOUTH TWICE DAILY WITH A MEAL   metoprolol tartrate 25 MG tablet Commonly known as:  LOPRESSOR TAKE 1/2 (ONE-HALF) TABLET BY MOUTH TWICE DAILY   nitroGLYCERIN 0.4 MG SL tablet Commonly known as:  NITROSTAT Place 1 tablet (0.4 mg total) under the tongue every 5 (five) minutes as needed. For chest pain   PRESERVISION AREDS 2 PO Take 1 tablet by mouth 2 (two) times daily.   RELION CONFIRM GLUCOSE MONITOR w/Device Kit Check blood sugar once daily as needed.  Diagnosis:  250.00  Non-insulin dependent   RELION LANCETS MICRO-THIN 33G Misc Use to test blood sugar once daily as needed.  Diagnosis:  250.00   Non insulin dependent.   SYMBICORT 160-4.5 MCG/ACT inhaler Generic drug:  budesonide-formoterol INHALE TWO PUFFS BY MOUTH ONCE DAILY IN THE MORNING   ticagrelor 60 MG Tabs tablet Commonly known as:  BRILINTA Take 1 tablet (60 mg total) by mouth 2 (two) times daily.   ULTRAM 50 MG tablet Generic drug:  traMADol Take 1 tablet (50 mg total) by mouth every 6 (six) hours as needed.

## 2017-10-01 ENCOUNTER — Other Ambulatory Visit: Payer: Self-pay | Admitting: Cardiovascular Disease

## 2017-10-13 ENCOUNTER — Telehealth: Payer: Self-pay | Admitting: Cardiovascular Disease

## 2017-10-13 ENCOUNTER — Other Ambulatory Visit: Payer: Self-pay

## 2017-10-13 MED ORDER — LISINOPRIL 40 MG PO TABS: 40.0000 mg | ORAL_TABLET | Freq: Every day | ORAL | 3 refills | Status: DC

## 2017-10-13 NOTE — Telephone Encounter (Signed)
°*  STAT* If patient is at the pharmacy, call can be transferred to refill team.   1. Which medications need to be refilled? (please list name of each medication and dose if known) lisinopril 40 MG  2. Which pharmacy/location (including street and city if local pharmacy) is medication to be sent to? [facility] on [address] ([facility] [phone]  3. Do they need a 30 day or 90 day supply? 30 day   States he has a new pharmacy and they say this prescription needs to be called in before they will fill

## 2017-10-13 NOTE — Telephone Encounter (Signed)
lisinopril (PRINIVIL,ZESTRIL) 40 MG tablet 90 tablet 3 10/13/2017    Sig - Route: Take 1 tablet (40 mg total) by mouth daily. - Oral   Sent to pharmacy as: lisinopril (PRINIVIL,ZESTRIL) 40 MG tablet   Notes to Pharmacy: Please consider 90 day supplies to promote better adherence   E-Prescribing Status: Receipt confirmed by pharmacy (10/13/2017 4:16 PM EDT)   [id number] [place] - [address]

## 2017-10-19 ENCOUNTER — Telehealth: Payer: Self-pay | Admitting: Cardiovascular Disease

## 2017-10-19 ENCOUNTER — Other Ambulatory Visit: Payer: Self-pay

## 2017-10-19 MED ORDER — ATORVASTATIN CALCIUM 40 MG PO TABS: 40.0000 mg | ORAL_TABLET | Freq: Every day | ORAL | 3 refills | Status: DC

## 2017-10-19 NOTE — Telephone Encounter (Signed)
°*  STAT* If patient is at the pharmacy, call can be transferred to refill team.   1. Which medications need to be refilled? (please list name of each medication and dose if known)      atorvastatin 40 mg po q day   2. Which pharmacy/location (including street and city if local pharmacy) is medication to be sent to? [facility] [address] [place]   3. Do they need a 30 day or 90 day supply? 90

## 2017-10-19 NOTE — Telephone Encounter (Signed)
atorvastatin (LIPITOR) 40 MG tablet 90 tablet 3 10/19/2017    Sig - Route: Take 1 tablet (40 mg total) by mouth daily. - Oral   Sent to pharmacy as: atorvastatin (LIPITOR) 40 MG tablet   E-Prescribing Status: Receipt confirmed by pharmacy (10/19/2017 4:15 PM EDT)   [id number] [place] - [address]

## 2017-10-30 ENCOUNTER — Telehealth: Payer: Self-pay | Admitting: Family Medicine

## 2017-10-30 NOTE — Telephone Encounter (Signed)
Copied from CRM [id number]. Topic: Quick Communication - Rx Refill/Question >> Oct 30, 2017 12:55 PM Eston Mouldavis, Verania Salberg B wrote: Medication: Insulin Glargine (LANTUS SOLOSTAR) 100 UNIT/ML Solostar Pen   Has the patient contacted their pharmacy? No  (Agent: If no, request that the patient contact the pharmacy for the refill.)  Preferred Pharmacy (with phone number or street name): [id number] [place] - [address] [facility] [address] [phone] (Phone) [phone] (Fax)       Agent: Please be advised that RX refills may take up to 3 business days. We ask that you follow-up with your pharmacy.

## 2017-10-31 ENCOUNTER — Other Ambulatory Visit: Payer: Self-pay

## 2017-10-31 MED ORDER — INSULIN GLARGINE 100 UNIT/ML SOLOSTAR PEN: PEN_INJECTOR | SUBCUTANEOUS | 99 refills | Status: DC

## 2017-11-03 ENCOUNTER — Other Ambulatory Visit: Payer: Self-pay | Admitting: Family Medicine

## 2017-11-03 NOTE — Telephone Encounter (Signed)
Copied from CRM [id number]. Topic: Quick Communication - See Telephone Encounter >> Nov 03, 2017  2:59 PM Windy KalataMichael, Norberto Wishon L, NT wrote: CRM for notification. See Telephone encounter for: 11/03/17.  [facility] is [facility] and states they missed a all from the [facility] in regards to Insulin Glargine (LANTUS SOLOSTAR) 100 UNIT/ML Solostar Pen. [facility] [place] she said she contacted them and that pharmacy does not have it. [facility]. Please advise.   [id number] [place] - [address] AT [facility] [address] [facility] [place] [id number] Phone: [phone] Fax: [phone]

## 2017-11-04 MED ORDER — INSULIN GLARGINE 100 UNIT/ML SOLOSTAR PEN: PEN_INJECTOR | SUBCUTANEOUS | 99 refills | Status: DC

## 2017-11-06 ENCOUNTER — Other Ambulatory Visit: Payer: Self-pay

## 2017-11-06 MED ORDER — INSULIN GLARGINE 100 UNIT/ML SOLOSTAR PEN: PEN_INJECTOR | SUBCUTANEOUS | 99 refills | Status: DC

## 2017-12-20 ENCOUNTER — Other Ambulatory Visit: Payer: Self-pay | Admitting: Family Medicine

## 2017-12-20 MED ORDER — METFORMIN HCL 500 MG PO TABS: ORAL_TABLET | ORAL | 0 refills | Status: DC

## 2017-12-20 NOTE — Telephone Encounter (Signed)
Pt has appt scheduled with Dr Para Marchuncan on 12/25/17; pt notified ref[facility] [address]; pt voiced understanding and will see Dr Para Marchuncan as scheduled. Nothing further needed.

## 2017-12-20 NOTE — Telephone Encounter (Signed)
Pt called to schedule an appointment with his pcp for medication refill of metformin.  Appt scheduled for 12/25/17.  Med pended.  LOV  Blood sugar discussed, 03/31/17 Last HA1C  03/28/17 of 8.4 Dr. Para Marchuncan  [phone]  [address]

## 2017-12-20 NOTE — Telephone Encounter (Signed)
Copied from CRM [id number]. Topic: Quick Communication - Rx Refill/Question >> Dec 20, 2017 12:50 PM Leafy Roobinson, Norma J wrote: Medication:metformin  Has the patient contacted their pharmacy? no(Agent: If yes, when and what did the pharmacy advise?)  [facility]erred [facility] (with phone number or street name[facility] [address] [place]  Agent: Please be advised that RX refills may take up to 3 business days. We ask that you follow-up with your pharmacy.

## 2017-12-25 ENCOUNTER — Ambulatory Visit (INDEPENDENT_AMBULATORY_CARE_PROVIDER_SITE_OTHER): Payer: Medicare Other | Admitting: Family Medicine

## 2017-12-25 ENCOUNTER — Encounter: Payer: Self-pay | Admitting: Family Medicine

## 2017-12-25 VITALS — BP 130/70 | HR 59 | Temp 98.4°F | Ht 69.0 in | Wt 201.0 lb

## 2017-12-25 DIAGNOSIS — I6523 Occlusion and stenosis of bilateral carotid arteries: Secondary | ICD-10-CM

## 2017-12-25 DIAGNOSIS — E1151 Type 2 diabetes mellitus with diabetic peripheral angiopathy without gangrene: Secondary | ICD-10-CM | POA: Diagnosis not present

## 2017-12-25 LAB — COMPREHENSIVE METABOLIC PANEL
ALT: 23 U/L (ref 0–53)
AST: 21 U/L (ref 0–37)
Albumin: 4.1 g/dL (ref 3.5–5.2)
Alkaline Phosphatase: 36 U/L — ABNORMAL LOW (ref 39–117)
BUN: 30 mg/dL — AB (ref 6–23)
CHLORIDE: 102 meq/L (ref 96–112)
CO2: 29 meq/L (ref 19–32)
CREATININE: 1.68 mg/dL — AB (ref 0.40–1.50)
Calcium: 9.8 mg/dL (ref 8.4–10.5)
GFR: 42.82 mL/min — ABNORMAL LOW (ref >60.00)
GLUCOSE: 139 mg/dL — AB (ref 70–99)
POTASSIUM: 4 meq/L (ref 3.5–5.1)
SODIUM: 141 meq/L (ref 135–145)
Total Bilirubin: 0.5 mg/dL (ref 0.2–1.2)
Total Protein: 6.8 g/dL (ref 6.0–8.3)

## 2017-12-25 LAB — LIPID PANEL
CHOL/HDL RATIO: 6
Cholesterol: 127 mg/dL (ref 0–200)
HDL: 22.7 mg/dL — ABNORMAL LOW (ref >39.00)
NONHDL: 104.07
Triglycerides: 360 mg/dL — ABNORMAL HIGH (ref 0.0–149.0)
VLDL: 72 mg/dL — AB (ref 0.0–40.0)

## 2017-12-25 LAB — LDL CHOLESTEROL, DIRECT: LDL DIRECT: 58 mg/dL

## 2017-12-25 LAB — HEMOGLOBIN A1C: Hgb A1c MFr Bld: 8.3 % — ABNORMAL HIGH (ref 4.6–6.5)

## 2017-12-25 NOTE — Patient Instructions (Addendum)
Go to the lab on the way out.  We'll contact you with your lab report. Take care.  Glad to see you.  Update me as needed.  Recheck about diabetes in about 6 months.

## 2017-12-25 NOTE — Progress Notes (Signed)
Diabetes:  Using medications without difficulties: usually about 35 units per day.   Hypoglycemic episodes: no Hyperglycemic episodes: no Feet problems: no tingling, no burning pain Blood Sugars averaging: usually ~ 140-190 in the AMs.  Diet dependent .  eye exam within last year: ~07/2017 per patient report.  Not retinopathy per patient report.   Due for labs.    He had injection per Dr. Patsy Lageropland with more relief on the L than R side.  He is putting up with it, "until it gets really bad."  I'll defer to patient.  "I feel okay otherwise, considering my age."  Exercise is limited, but diet is good per patient report.  Discussed.    Meds, vitals, and allergies reviewed.   ROS: Per HPI unless specifically indicated in ROS section   GEN: nad, alert and oriented HEENT: mucous membranes moist NECK: supple w/o LA CV: rrr. PULM: ctab, no inc wob ABD: soft, +bs EXT: no edema  Diabetic foot exam: Normal inspection No skin breakdown No calluses  Dec DP pulses relative to radial pulse but intact B Normal sensation to light touch and monofilament Nails normal

## 2017-12-25 NOTE — Assessment & Plan Note (Signed)
D/w pt about diet and exercise.  He is putting up with hip area pain and that limits exercise.  He is working on diet.  Labs today.  No change in meds at this point.  See lab results.  Okay plan on recheck in about 6 months.  He agrees.

## 2018-01-05 ENCOUNTER — Other Ambulatory Visit: Payer: Self-pay

## 2018-01-05 MED ORDER — LISINOPRIL 40 MG PO TABS: 40.0000 mg | ORAL_TABLET | Freq: Every day | ORAL | 3 refills | Status: DC

## 2018-01-05 NOTE — Telephone Encounter (Signed)
Refill sent for Lisinopril 40 mg  

## 2018-01-06 ENCOUNTER — Emergency Department
Admission: EM | Admit: 2018-01-06 | Discharge: 2018-01-06 | Disposition: A | Discharge status: Home / Self Care | Payer: Medicare Other | Attending: Emergency Medicine | Admitting: Emergency Medicine

## 2018-01-06 ENCOUNTER — Encounter: Payer: Self-pay | Admitting: *Deleted

## 2018-01-06 ENCOUNTER — Other Ambulatory Visit: Payer: Self-pay

## 2018-01-06 DIAGNOSIS — Z79899 Other long term (current) drug therapy: Secondary | ICD-10-CM | POA: Diagnosis not present

## 2018-01-06 DIAGNOSIS — I252 Old myocardial infarction: Secondary | ICD-10-CM | POA: Insufficient documentation

## 2018-01-06 DIAGNOSIS — I2510 Atherosclerotic heart disease of native coronary artery without angina pectoris: Secondary | ICD-10-CM | POA: Insufficient documentation

## 2018-01-06 DIAGNOSIS — N390 Urinary tract infection, site not specified: Secondary | ICD-10-CM | POA: Insufficient documentation

## 2018-01-06 DIAGNOSIS — I129 Hypertensive chronic kidney disease with stage 1 through stage 4 chronic kidney disease, or unspecified chronic kidney disease: Secondary | ICD-10-CM | POA: Diagnosis not present

## 2018-01-06 DIAGNOSIS — R531 Weakness: Secondary | ICD-10-CM | POA: Diagnosis present

## 2018-01-06 DIAGNOSIS — E7800 Pure hypercholesterolemia, unspecified: Secondary | ICD-10-CM | POA: Diagnosis not present

## 2018-01-06 DIAGNOSIS — I10 Essential (primary) hypertension: Secondary | ICD-10-CM | POA: Diagnosis not present

## 2018-01-06 DIAGNOSIS — Z7982 Long term (current) use of aspirin: Secondary | ICD-10-CM | POA: Diagnosis not present

## 2018-01-06 DIAGNOSIS — Z794 Long term (current) use of insulin: Secondary | ICD-10-CM | POA: Insufficient documentation

## 2018-01-06 DIAGNOSIS — E1122 Type 2 diabetes mellitus with diabetic chronic kidney disease: Secondary | ICD-10-CM | POA: Insufficient documentation

## 2018-01-06 DIAGNOSIS — Z87891 Personal history of nicotine dependence: Secondary | ICD-10-CM | POA: Diagnosis not present

## 2018-01-06 DIAGNOSIS — J449 Chronic obstructive pulmonary disease, unspecified: Secondary | ICD-10-CM | POA: Diagnosis not present

## 2018-01-06 DIAGNOSIS — N189 Chronic kidney disease, unspecified: Secondary | ICD-10-CM | POA: Insufficient documentation

## 2018-01-06 LAB — URINALYSIS, COMPLETE (UACMP) WITH MICROSCOPIC
Bilirubin Urine: NEGATIVE
GLUCOSE, UA: NEGATIVE mg/dL
Hgb urine dipstick: NEGATIVE
Ketones, ur: 5 mg/dL — AB
NITRITE: NEGATIVE
PH: 5 (ref 5.0–8.0)
PROTEIN: 30 mg/dL — AB
SPECIFIC GRAVITY, URINE: 1.025 (ref 1.005–1.030)
Squamous Epithelial / LPF: NONE SEEN (ref 0–5)
WBC, UA: >50 WBC/hpf — ABNORMAL HIGH (ref 0–5)

## 2018-01-06 LAB — BASIC METABOLIC PANEL
Anion gap: 11 (ref 5–15)
BUN: 29 mg/dL — AB (ref 8–23)
CALCIUM: 9.3 mg/dL (ref 8.9–10.3)
CO2: 25 mmol/L (ref 22–32)
Chloride: 103 mmol/L (ref 98–111)
Creatinine, Ser: 1.69 mg/dL — ABNORMAL HIGH (ref 0.61–1.24)
GFR calc Af Amer: 45 mL/min — ABNORMAL LOW (ref >60)
GFR calc non Af Amer: 39 mL/min — ABNORMAL LOW (ref >60)
GLUCOSE: 123 mg/dL — AB (ref 70–99)
Potassium: 3.5 mmol/L (ref 3.5–5.1)
SODIUM: 139 mmol/L (ref 135–145)

## 2018-01-06 LAB — CHLAMYDIA/NGC RT PCR (ARMC ONLY)
Chlamydia Tr: NOT DETECTED
N GONORRHOEAE: NOT DETECTED

## 2018-01-06 LAB — CBC
HCT: 41 % (ref 40.0–52.0)
Hemoglobin: 13.9 g/dL (ref 13.0–18.0)
MCH: 31 pg (ref 26.0–34.0)
MCHC: 34 g/dL (ref 32.0–36.0)
MCV: 91.4 fL (ref 80.0–100.0)
PLATELETS: 242 10*3/uL (ref 150–440)
RBC: 4.48 MIL/uL (ref 4.40–5.90)
RDW: 13.9 % (ref 11.5–14.5)
WBC: 12.9 10*3/uL — AB (ref 3.8–10.6)

## 2018-01-06 MED ORDER — CEFTRIAXONE SODIUM 1 G IJ SOLR
1.0000 g | Freq: Once | INTRAMUSCULAR | Status: AC
  Administered 2018-01-06: 1 g via INTRAVENOUS
  Filled 2018-01-06: qty 10

## 2018-01-06 MED ORDER — CEPHALEXIN 500 MG PO CAPS: 500.0000 mg | ORAL_CAPSULE | Freq: Three times a day (TID) | ORAL | 0 refills | Status: DC

## 2018-01-06 MED ORDER — SODIUM CHLORIDE 0.9 % IV BOLUS
500.0000 mL | Freq: Once | INTRAVENOUS | Status: AC
  Administered 2018-01-06: 500 mL via INTRAVENOUS

## 2018-01-06 NOTE — ED Notes (Signed)
Lab states that they will run chlamydia/ngc rt PCR

## 2018-01-06 NOTE — ED Notes (Addendum)
Pt c/o burning and pain with urination x3-4 days - he also reports a foul smelling discharge from his penis Pt c/o not sleeping and a poor appetite for the past month Pt walked from triage to room without difficulty or shortness of breath - gait steady

## 2018-01-06 NOTE — Discharge Instructions (Signed)
If you have fever, weakness, vomiting, pain, or you feel worse in any way, return to the emergency department

## 2018-01-06 NOTE — ED Provider Notes (Addendum)
[facility] Emergency Department Provider Note  ____________________________________________   I have reviewed the triage vital signs and the nursing notes. Where available I have reviewed prior notes and, if possible and indicated, outside hospital notes.    HISTORY  Chief Complaint Weakness and Dysuria    HPI [name] is a 72 y.o. male  History of diabetes mellitus,, states that the last 3 or 4 days he has had urea, no fevers but is felt somewhat generally weak.  Denies any nausea vomiting.  No flank pain.  Does not have a history of UTIs in the past but feels that he does have one. No other complaints.  Nothing makes better nothing makes it worse he is awake and alert and oriented not his baseline according to family and himself.  Past Medical History:  Diagnosis Date  . CAD (coronary artery disease)    a. 1999: PCI-->LAD 2/2 MI; b. 2005: inf MI s/p PCI/DES x 4 to RCA; c. Myoview in 12/2005: EF 61%, no evidence for ischemia; d. cath 10/2014: occluded mLAD w/ L to L and L to R collats, dRCA 95% s/p PCI/DES 0%, mPDA 70%, LCx mild to mod irregs, procedure complicated by inf ST ele in recovery, repeat cath showed acute dRCA stent thrombosis o/w occluded mRPDA, PTCA dRCA, PCI/DES RPDA, aggrastat x 18 hr  . CKD (chronic kidney disease)   . COPD (chronic obstructive pulmonary disease) ([place])   . DM2 (diabetes mellitus, type 2) ([place])   . H/O hiatal hernia   . Heart attack ([place])   . HTN (hypertension)   . Hypercholesterolemia   . Impaired fasting glucose    Elevated after steroid injection  . Osteoarthritis of hip   . Osteoarthritis, knee   . PAD (peripheral artery disease) ([facility])    L fem to below the knee popliteal vein bypass graft  . Peri-rectal abscess 12/27/2011  . Tobacco abuse    Prior    Patient Active Problem List   Diagnosis Date Noted  . CAD (coronary artery disease), native coronary artery 07/16/2017  . Shoulder pain 12/30/2016  . Skin  lesion 12/30/2016  . Unstable angina ([place]) 09/03/2016  . AAA (abdominal aortic aneurysm) without rupture ([place]) 07/13/2016  . Popliteal artery aneurysm ([place]) 07/13/2016  . Carotid artery disease ([place]) 01/30/2015  . NSTEMI (non-ST elevated myocardial infarction) ([place]) 11/16/2014  . Medicare annual wellness visit, initial 10/21/2014  . Advance care planning 10/21/2014  . PAD (peripheral artery disease) ([place]) 09/09/2014  . COPD exacerbation ([place]) 08/21/2014  . DM (diabetes mellitus), type 2 with peripheral vascular complications ([place]) 87/56/4332  . Back pain 02/24/2013  . Cough 01/26/2011  . COPD (chronic obstructive pulmonary disease) ([place]) 12/26/2010  . FATIGUE 05/11/2009  . Essential hypertension 05/08/2009  . Hyperlipidemia 06/20/2008    Past Surgical History:  Procedure Laterality Date  . CORONARY ANGIOPLASTY    . Veteran, 2006   Multiple, LAD in 1999, RCA in 2005  . FEMORAL-POPLITEAL BYPASS GRAFT  2016   L fem to below the knee popliteal vein bypass graft  . INCISE AND DRAIN ABCESS  2007   on scrotum  . INCISION AND DRAINAGE PERIRECTAL ABSCESS  12/28/2011   Procedure: IRRIGATION AND DEBRIDEMENT PERIRECTAL ABSCESS;  Surgeon: Merrie Roof, MD;  Location: [facility];  Service: General;  Laterality: N/A;    Prior to Admission medications   Medication Sig Start Date End Date Taking? Authorizing Provider  albuterol (PROAIR HFA) 108 (90 BASE)  MCG/ACT inhaler Inhale 2 puffs into the lungs 3 (three) times daily as needed (cough). 04/17/13   Tonia Ghent, MD  aspirin 81 MG EC tablet Take 1 tablet (81 mg total) by mouth daily. 11/19/14   Minna Merritts, MD  atorvastatin (LIPITOR) 40 MG tablet Take 1 tablet (40 mg total) by mouth daily. 10/19/17   Minna Merritts, MD  Blood Glucose Monitoring Suppl (RELION CONFIRM GLUCOSE MONITOR) W/DEVICE KIT Check blood sugar once daily as needed.  Diagnosis:  250.00  Non-insulin dependent 06/20/13   Tonia Ghent, MD   celecoxib (CELEBREX) 200 MG capsule Take 1 capsule (200 mg total) by mouth daily. 07/24/17   Copland, Frederico Hamman, MD  fenofibrate (TRICOR) 145 MG tablet TAKE 1 TABLET BY MOUTH DAILY 10/02/17   Minna Merritts, MD  glipiZIDE (GLUCOTROL) 5 MG tablet TAKE 1 TABLET BY MOUTH ONCE DAILY BEFORE BREAKFAST 08/14/17   Tonia Ghent, MD  glucosamine-chondroitin 500-400 MG tablet Take 1 tablet by mouth 2 (two) times daily.    [provider]  glucose blood (RELION CONFIRM/MICRO TEST) test strip Use as instructed to test blood sugar once daily as needed.  Diagnosis:  250.00  Non insulin-dependent. 06/20/13   Tonia Ghent, MD  hydrochlorothiazide (HYDRODIURIL) 25 MG tablet Take 1 tablet (25 mg total) by mouth daily. 08/21/17   Minna Merritts, MD  Insulin Glargine (LANTUS SOLOSTAR) 100 UNIT/ML Solostar Pen Inject up to 35 units per day 11/06/17   Tonia Ghent, MD  Insulin Pen Needle (B-D UF III MINI PEN NEEDLES) 31G X 5 MM MISC Use as instructed to inject insulin.  Diagnosis:  250.00  Insulin dependent. 08/08/17   Tonia Ghent, MD  lisinopril (PRINIVIL,ZESTRIL) 40 MG tablet Take 1 tablet (40 mg total) by mouth daily. 01/05/18   Minna Merritts, MD  meclizine (ANTIVERT) 25 MG tablet Take 25 mg by mouth every 6 (six) hours as needed. For dizziness/vertigo    [provider]  metFORMIN (GLUCOPHAGE) 500 MG tablet TAKE TWO TABLETS BY MOUTH TWICE DAILY WITH A MEAL 12/20/17   Tonia Ghent, MD  metoprolol tartrate (LOPRESSOR) 25 MG tablet TAKE 1/2 (ONE-HALF) TABLET BY MOUTH TWICE DAILY 08/15/17   Minna Merritts, MD  Multiple Vitamins-Minerals (PRESERVISION AREDS 2 PO) Take 1 tablet by mouth 2 (two) times daily.    [provider]  nitroGLYCERIN (NITROSTAT) 0.4 MG SL tablet Place 1 tablet (0.4 mg total) under the tongue every 5 (five) minutes as needed. For chest pain 09/12/16   Tonia Ghent, MD  RELION LANCETS MICRO-THIN 33G MISC Use to test blood sugar once daily as needed.   Diagnosis:  250.00   Non insulin dependent. 06/20/13   Tonia Ghent, MD  SYMBICORT 160-4.5 MCG/ACT inhaler INHALE TWO PUFFS BY MOUTH ONCE DAILY IN THE MORNING 11/21/16   Tonia Ghent, MD  ticagrelor (BRILINTA) 60 MG TABS tablet Take 1 tablet (60 mg total) by mouth 2 (two) times daily. 08/14/17   Minna Merritts, MD  traMADol (ULTRAM) 50 MG tablet Take 1 tablet (50 mg total) by mouth every 6 (six) hours as needed. 11/19/14   Minna Merritts, MD    Allergies Augmentin [amoxicillin-pot clavulanate] and Morphine and related  Family History  Problem Relation Age of Onset  . Heart failure Father        CHF  . CAD Mother   . Diabetes Mother   . Colon cancer Neg Hx   . Prostate  cancer Neg Hx     Social History Social History   Tobacco Use  . Smoking status: Former Smoker    Packs/day: 1.00    Years: 40.00    Pack years: 40.00    Types: Cigarettes    Last attempt to quit: 10/21/1998    Years since quitting: 19.2  . Smokeless tobacco: Never Used  . Tobacco comment: Past smoker, states he will smoke maybe 1 cigarette/ month or so still  Substance Use Topics  . Alcohol use: Yes    Alcohol/week: 0.0 oz    Comment: Occasional beer on the weekends  . Drug use: No    Review of Systems Constitutional: No fever/chills Eyes: No visual changes. ENT: No sore throat. No stiff neck no neck pain Cardiovascular: Denies chest pain. Respiratory: Denies shortness of breath. Gastrointestinal:   no vomiting.  No diarrhea.  No constipation. Genitourinary: + for dysuria. Musculoskeletal: Negative lower extremity swelling Skin: Negative for rash. Neurological: Negative for severe headaches, focal weakness or numbness.   ____________________________________________   PHYSICAL EXAM:  VITAL SIGNS: ED Triage Vitals  Enc Vitals Group     BP 01/06/18 1815 110/71     Pulse Rate 01/06/18 1815 76     Resp 01/06/18 1815 16     Temp 01/06/18 1815 98.2 F (36.8 C)     Temp Source 01/06/18  1815 Oral     SpO2 01/06/18 1815 96 %     Weight 01/06/18 1816 201 lb (91.2 kg)     Height 01/06/18 1816 5' 9"  (1.753 m)     Head Circumference --      Peak Flow --      Pain Score 01/06/18 1816 0     Pain Loc --      Pain Edu? --      Excl. in [place]? --     Constitutional: Alert and oriented. Well appearing and in no acute distress. Eyes: Conjunctivae are normal Head: Atraumatic HEENT: No congestion/rhinnorhea. Mucous membranes are moist.  Oropharynx non-erythematous Neck:   Nontender with no meningismus, no masses, no stridor Cardiovascular: Normal rate, regular rhythm. Grossly normal heart sounds.  Good peripheral circulation. Respiratory: Normal respiratory effort.  No retractions. Lungs CTAB. Abdominal: Soft and nontender. No distention. No guarding no rebound Back:  There is no focal tenderness or step off.  there is no midline tenderness there are no lesions noted. there is no CVA tenderness Normal external male genitalia no discharge no tenderness Musculoskeletal: No lower extremity tenderness, no upper extremity tenderness. No joint effusions, no DVT signs strong distal pulses no edema Neurologic:  Normal speech and language. No gross focal neurologic deficits are appreciated.  Skin:  Skin is warm, dry and intact. No rash noted. Psychiatric: Mood and affect are normal. Speech and behavior are normal.  ____________________________________________   LABS (all labs ordered are listed, but only abnormal results are displayed)  Labs Reviewed  BASIC METABOLIC PANEL - Abnormal; Notable for the following components:      Result Value   Glucose, Bld 123 (*)    BUN 29 (*)    Creatinine, Ser 1.69 (*)    GFR calc non Af Amer 39 (*)    GFR calc Af Amer 45 (*)    All other components within normal limits  CBC - Abnormal; Notable for the following components:   WBC 12.9 (*)    All other components within normal limits  URINALYSIS, COMPLETE (UACMP) WITH MICROSCOPIC - Abnormal;  Notable for the  following components:   Color, Urine YELLOW (*)    APPearance HAZY (*)    Ketones, ur 5 (*)    Protein, ur 30 (*)    Leukocytes, UA MODERATE (*)    WBC, UA >50 (*)    Bacteria, UA RARE (*)    All other components within normal limits  CHLAMYDIA/NGC RT PCR ([facility] ONLY)  URINE CULTURE  CBG MONITORING, ED    Pertinent labs  results that were available during my care of the patient were reviewed by me and considered in my medical decision making (see chart for details). ____________________________________________  EKG  I personally interpreted any EKGs ordered by me or triage Sinus rhythm rate 74 bpm no acute ST elevation or depression no acute ischemic changes ____________________________________________  RADIOLOGY  Pertinent labs & imaging results that were available during my care of the patient were reviewed by me and considered in my medical decision making (see chart for details). If possible, patient and/or family made aware of any abnormal findings.  No results found. ____________________________________________    PROCEDURES  Procedure(s) performed: None  Procedures  Critical Care performed: None  ____________________________________________   INITIAL IMPRESSION / ASSESSMENT AND PLAN / ED COURSE  Pertinent labs & imaging results that were available during my care of the patient were reviewed by me and considered in my medical decision making (see chart for details).  Well-appearing gentleman states he feels generally weak and feels that he has UTI with dysuria and urinary frequency.  His urine is consistent with UTI urine cultures pending, given his age and history of diabetes I am giving him a dose of IV Rocephin.  We talked about admission to the hospital but he is adamant that he would like to go home.  Urine culture is pending.  I we will give him IV fluid as he appears to have a slightly increased BUN/creatinine ratio although his  creatinine is at or near its baseline.  Patient very comfortable with this plan.  He does understand the need for antibiotic culture check and therefore if he feels worse he needs to come back.    ____________________________________________   FINAL CLINICAL IMPRESSION(S) / ED DIAGNOSES  Final diagnoses:  None      This chart was dictated using voice recognition software.  Despite best efforts to proofread,  errors can occur which can change meaning.      Schuyler Amor, MD 01/06/18 2116    Schuyler Amor, MD 01/06/18 2147

## 2018-01-06 NOTE — ED Triage Notes (Signed)
Pt to ED reporting burning with urination for the past three days. Pt also reports increased fatigue and decreased appetite. No fevers at home. No NVD. Pt had one episode of diaphoresis three days ago but has not had symptoms since.

## 2018-01-08 ENCOUNTER — Telehealth: Payer: Self-pay | Admitting: Family Medicine

## 2018-01-08 DIAGNOSIS — I6529 Occlusion and stenosis of unspecified carotid artery: Secondary | ICD-10-CM

## 2018-01-08 LAB — URINE CULTURE: Culture: <10000 — AB

## 2018-01-08 NOTE — Telephone Encounter (Signed)
Due for recheck carotids, I put in the order.  Thanks.

## 2018-01-08 NOTE — Telephone Encounter (Addendum)
Patient's wife Burna [place]anda ([place]) advised as instructed by telephone and verbalized understanding. Advise Burna MortimerWanda that one of the referral coordinators will be in touch to get this set up. Patient's wife stated that he was seen in the ER and was told to follow-up with Dr. Para Marchuncan this week. Appointment scheduled. Okay per Dr. Para Marchuncan.

## 2018-01-12 ENCOUNTER — Ambulatory Visit (INDEPENDENT_AMBULATORY_CARE_PROVIDER_SITE_OTHER): Payer: Medicare Other | Admitting: Family Medicine

## 2018-01-12 ENCOUNTER — Encounter: Payer: Self-pay | Admitting: Family Medicine

## 2018-01-12 ENCOUNTER — Other Ambulatory Visit: Payer: Self-pay | Admitting: *Deleted

## 2018-01-12 DIAGNOSIS — R300 Dysuria: Secondary | ICD-10-CM | POA: Diagnosis not present

## 2018-01-12 DIAGNOSIS — I6523 Occlusion and stenosis of bilateral carotid arteries: Secondary | ICD-10-CM

## 2018-01-12 MED ORDER — BLOOD GLUCOSE MONITOR KIT: 1.0000 | PACK | Freq: Every day | 0 refills | Status: DC | PRN

## 2018-01-12 MED ORDER — CEPHALEXIN 500 MG PO CAPS: 500.0000 mg | ORAL_CAPSULE | Freq: Three times a day (TID) | ORAL | 0 refills | Status: AC

## 2018-01-12 MED ORDER — BLOOD GLUCOSE MONITOR KIT: 1.0000 | PACK | Freq: Every day | 0 refills | Status: AC | PRN

## 2018-01-12 NOTE — Patient Instructions (Signed)
Finish the current antibiotics and if you then have more troubles then restart and update me as needed.  Take care.  Glad to see you.

## 2018-01-12 NOTE — Progress Notes (Signed)
He isn't lightheaded.   He had burning with urination and was seen at ER.  Started on keflex.  Nausea is better but appetite still low.  No fevers.  No burning with urination now.  No fevers.  Nocturia is better now.  Emergency room course discussed with patient.  No fevers, chills, nausea, vomiting.  Meds, vitals, and allergies reviewed.   ROS: Per HPI unless specifically indicated in ROS section   GEN: nad, alert and oriented HEENT: mucous membranes moist NECK: supple w/o LA CV: rrr. PULM: ctab, no inc wob ABD: soft, +bs EXT: no edema SKIN: no acute rash

## 2018-01-14 DIAGNOSIS — R300 Dysuria: Secondary | ICD-10-CM | POA: Insufficient documentation

## 2018-01-14 NOTE — Assessment & Plan Note (Signed)
Clearly improved in the meantime.  Presumed cystitis but urine culture was negative.  D/w pt about options.  Would finish current abx and hold rx for keflex in case sx return.  He agrees.   Update me as needed.  Okay for outpatient follow-up.

## 2018-01-18 ENCOUNTER — Other Ambulatory Visit: Payer: Self-pay | Admitting: *Deleted

## 2018-01-24 ENCOUNTER — Ambulatory Visit (INDEPENDENT_AMBULATORY_CARE_PROVIDER_SITE_OTHER): Payer: Medicare Other

## 2018-01-24 DIAGNOSIS — I6529 Occlusion and stenosis of unspecified carotid artery: Secondary | ICD-10-CM

## 2018-01-25 ENCOUNTER — Telehealth: Payer: Self-pay | Admitting: Cardiovascular Disease

## 2018-01-25 ENCOUNTER — Other Ambulatory Visit: Payer: Self-pay | Admitting: *Deleted

## 2018-01-25 MED ORDER — BUDESONIDE-FORMOTEROL FUMARATE 160-4.5 MCG/ACT IN AERO: INHALATION_SPRAY | RESPIRATORY_TRACT | 5 refills | Status: DC

## 2018-01-25 NOTE — Telephone Encounter (Signed)
error 

## 2018-01-30 ENCOUNTER — Telehealth (INDEPENDENT_AMBULATORY_CARE_PROVIDER_SITE_OTHER): Payer: Self-pay

## 2018-01-30 NOTE — Telephone Encounter (Signed)
Patient called and stated that he had a procedure done on his neck with another doctor and that they found something that has caused some concern, and he would like to speak to you about what was found.  Dr. Lewie LoronGolan offices  Patient was advised to make an appointment to discuss the findings and what needs to be done next.

## 2018-02-01 ENCOUNTER — Ambulatory Visit (INDEPENDENT_AMBULATORY_CARE_PROVIDER_SITE_OTHER): Payer: Medicare Other | Admitting: Vascular Surgery

## 2018-02-01 ENCOUNTER — Encounter (INDEPENDENT_AMBULATORY_CARE_PROVIDER_SITE_OTHER): Payer: Self-pay | Admitting: Vascular Surgery

## 2018-02-01 VITALS — BP 108/63 | HR 64 | Resp 12 | Ht 68.0 in | Wt 201.0 lb

## 2018-02-01 DIAGNOSIS — I10 Essential (primary) hypertension: Secondary | ICD-10-CM

## 2018-02-01 DIAGNOSIS — I6523 Occlusion and stenosis of bilateral carotid arteries: Secondary | ICD-10-CM

## 2018-02-01 DIAGNOSIS — I25118 Atherosclerotic heart disease of native coronary artery with other forms of angina pectoris: Secondary | ICD-10-CM | POA: Diagnosis not present

## 2018-02-01 DIAGNOSIS — I739 Peripheral vascular disease, unspecified: Secondary | ICD-10-CM

## 2018-02-01 DIAGNOSIS — I724 Aneurysm of artery of lower extremity: Secondary | ICD-10-CM | POA: Diagnosis not present

## 2018-02-04 ENCOUNTER — Encounter (INDEPENDENT_AMBULATORY_CARE_PROVIDER_SITE_OTHER): Payer: Self-pay | Admitting: Vascular Surgery

## 2018-02-04 NOTE — Progress Notes (Signed)
MRN : [id number]  [name] is a 72 y.o. (01/20/1946) male who presents with chief complaint of  Chief Complaint  Patient presents with  . Follow-up    Carotid Stenosis  .  History of Present Illness:   The patient is seen for follow up evaluation of carotid stenosis. The carotid stenosis followed by ultrasound.   The patient denies amaurosis fugax. There is no recent history of TIA symptoms or focal motor deficits. There is no prior documented CVA.  The patient is taking enteric-coated aspirin 81 mg daily.  There is no history of migraine headaches. There is no history of seizures.  The patient is also followed for PAD with history of thrombosed popliteal artery aneurysm. There have been no interval changes in lower extremity symptoms. No interval shortening of the patient's claudication distance or development of rest pain symptoms. No new ulcers or wounds have occurred since the last visit.  There have been no significant changes to the patient's overall health care.  The patient denies amaurosis fugax or recent TIA symptoms. There are no recent neurological changes noted. The patient denies history of DVT, PE or superficial thrombophlebitis. The patient denies recent episodes of angina or shortness of breath.   Carotid Duplex done today shows 50-69% bilaterally.  No change compared to last study  No outpatient medications have been marked as taking for the 02/01/18 encounter (Office Visit) with Gilda Crease, Latina Craver, MD.    Past Medical History:  Diagnosis Date  . CAD (coronary artery disease)    a. 1999: PCI-->LAD 2/2 MI; b. 2005: inf MI s/p PCI/DES x 4 to RCA; c. Myoview in 12/2005: EF 61%, no evidence for ischemia; d. cath 10/2014: occluded mLAD w/ L to L and L to R collats, dRCA 95% s/p PCI/DES 0%, mPDA 70%, LCx mild to mod irregs, procedure complicated by inf ST ele in recovery, repeat cath showed acute dRCA stent thrombosis o/w occluded mRPDA, PTCA dRCA, PCI/DES  RPDA, aggrastat x 18 hr  . CKD (chronic kidney disease)   . COPD (chronic obstructive pulmonary disease) (HCC)   . DM2 (diabetes mellitus, type 2) (HCC)   . H/O hiatal hernia   . Heart attack (HCC)   . HTN (hypertension)   . Hypercholesterolemia   . Impaired fasting glucose    Elevated after steroid injection  . Osteoarthritis of hip   . Osteoarthritis, knee   . PAD (peripheral artery disease) (HCC)    L fem to below the knee popliteal vein bypass graft  . Peri-rectal abscess 12/27/2011  . Tobacco abuse    Prior    Past Surgical History:  Procedure Laterality Date  . CORONARY ANGIOPLASTY    . CORONARY STENT PLACEMENT  1999, 2006   Multiple, LAD in 1999, RCA in 2005  . FEMORAL-POPLITEAL BYPASS GRAFT  2016   L fem to below the knee popliteal vein bypass graft  . INCISE AND DRAIN ABCESS  2007   on scrotum  . INCISION AND DRAINAGE PERIRECTAL ABSCESS  12/28/2011   Procedure: IRRIGATION AND DEBRIDEMENT PERIRECTAL ABSCESS;  Surgeon: Robyne Askew, MD;  Location: [facility] OR;  Service: General;  Laterality: N/A;    Social History Social History   Tobacco Use  . Smoking status: Former Smoker    Packs/day: 1.00    Years: 40.00    Pack years: 40.00    Types: Cigarettes    Last attempt to quit: 10/21/1998    Years since quitting: 19.3  . Smokeless  tobacco: Never Used  . Tobacco comment: Past smoker, states he will smoke maybe 1 cigarette/ month or so still  Substance Use Topics  . Alcohol use: Yes    Alcohol/week: 0.0 oz    Comment: Occasional beer on the weekends  . Drug use: No    Family History Family History  Problem Relation Age of Onset  . Heart failure Father        CHF  . CAD Mother   . Diabetes Mother   . Colon cancer Neg Hx   . Prostate cancer Neg Hx     Allergies  Allergen Reactions  . Augmentin [Amoxicillin-Pot Clavulanate] Other (See Comments)    Nausea,vomiting,diarrhea  . Morphine And Related Nausea And Vomiting    Vomiting, GI upset     REVIEW OF  SYSTEMS (Negative unless checked)  Constitutional: [] Weight loss  [] Fever  [] Chills Cardiac: [] Chest pain   [] Chest pressure   [] Palpitations   [] Shortness of breath when laying flat   [] Shortness of breath with exertion. Vascular:  [x] Pain in legs with walking   [] Pain in legs at rest  [] History of DVT   [] Phlebitis   [] Swelling in legs   [] Varicose veins   [] Non-healing ulcers Pulmonary:   [] Uses home oxygen   [] Productive cough   [] Hemoptysis   [] Wheeze  [] COPD   [] Asthma Neurologic:  [] Dizziness   [] Seizures   [] History of stroke   [] History of TIA  [] Aphasia   [] Vissual changes   [] Weakness or numbness in arm   [] Weakness or numbness in leg Musculoskeletal:   [] Joint swelling   [] Joint pain   [] Low back pain Hematologic:  [] Easy bruising  [] Easy bleeding   [] Hypercoagulable state   [] Anemic Gastrointestinal:  [] Diarrhea   [] Vomiting  [] Gastroesophageal reflux/heartburn   [] Difficulty swallowing. Genitourinary:  [] Chronic kidney disease   [] Difficult urination  [] Frequent urination   [] Blood in urine Skin:  [] Rashes   [] Ulcers  Psychological:  [] History of anxiety   []  History of major depression.  Physical Examination  Vitals:   02/01/18 0902  BP: 108/63  Pulse: 64  Resp: 12  Weight: 201 lb (91.2 kg)  Height: 5\' 8"  (1.727 m)   Body mass index is 30.56 kg/m. Gen: WD/WN, NAD Head: [place]/AT, No temporalis wasting.  Ear/Nose/Throat: Hearing grossly intact, nares w/o erythema or drainage Eyes: PER, EOMI, sclera nonicteric.  Neck: Supple, no large masses.   Pulmonary:  Good air movement, no audible wheezing bilaterally, no use of accessory muscles.  Cardiac: RRR, no JVD Vascular:  Bilateral carotid bruits Vessel Right Left  Radial Palpable Palpable  PT Trace Palpable Trace Palpable  DP Trace Palpable Trace Palpable  Gastrointestinal: Non-distended. No guarding/no peritoneal signs.  Musculoskeletal: M/S 5/5 throughout.  No deformity or atrophy.  Neurologic: CN 2-12 intact.  Symmetrical.  Speech is fluent. Motor exam as listed above. Psychiatric: Judgment intact, Mood & affect appropriate for pt's clinical situation. Dermatologic: No rashes or ulcers noted.  No changes consistent with cellulitis. Lymph : No lichenification or skin changes of chronic lymphedema.  CBC Lab Results  Component Value Date   WBC 12.9 (H) 01/06/2018   HGB 13.9 01/06/2018   HCT 41.0 01/06/2018   MCV 91.4 01/06/2018   PLT 242 01/06/2018    BMET    Component Value Date/Time   NA 139 01/06/2018 1824   NA 134 (L) 10/29/2014 0908   K 3.5 01/06/2018 1824   K 4.3 10/29/2014 0908   CL 103 01/06/2018 1824   CL  103 10/29/2014 0908   CO2 25 01/06/2018 1824   CO2 24 10/29/2014 0908   GLUCOSE 123 (H) 01/06/2018 1824   GLUCOSE 142 (H) 10/29/2014 0908   BUN 29 (H) 01/06/2018 1824   BUN 20 10/29/2014 0908   CREATININE 1.69 (H) 01/06/2018 1824   CREATININE 1.32 (H) 10/29/2014 0908   CREATININE 0.96 01/28/2011 0855   CALCIUM 9.3 01/06/2018 1824   CALCIUM 9.3 10/29/2014 0908   GFRNONAA 39 (L) 01/06/2018 1824   GFRNONAA 55 (L) 10/29/2014 0908   GFRAA 45 (L) 01/06/2018 1824   GFRAA >60 10/29/2014 0908   CrCl cannot be calculated (Patient's most recent lab result is older than the maximum 21 days allowed.).  COAG Lab Results  Component Value Date   INR 0.9 10/24/2014   INR 1.07 12/28/2011    Radiology No results found.    Assessment/Plan 1. Bilateral carotid artery stenosis Recommend:  Given the patient's asymptomatic subcritical stenosis no further invasive testing or surgery at this time.  Duplex ultrasound shows 50-69% stenosis bilaterally.  Continue antiplatelet therapy as prescribed Continue management of CAD, HTN and Hyperlipidemia Healthy heart diet,  encouraged exercise at least 4 times per week Follow up in 6 months with duplex ultrasound and physical exam   - VAS US CAROTID; Future  2. Popliteal artery aneurysm (HCC)  Recommend:  The patient has  evidence of atherosclerosis of the lower extremities with claudication.  The patient does not voice lifestyle limiting changes at this point in time.  Noninvasive studies do not suggest clinically significant change.  No invasive studies, angiography or surgery at this time The patient should continue walking and begin a more formal exercise program.  The patient should continue antiplatelet therapy and aggressive treatment of the lipid abnormalities  No changes in the patient's medications at this time  The patient should continue wearing graduated compression socks 10-15 mmHg strength to control the mild edema.    3. PAD (peripheral artery disease) (HCC)  Recommend:  The patient has evidence of atherosclerosis of the lower extremities with claudication.  The patient does not voice lifestyle limiting changes at this point in time.  Noninvasive studies do not suggest clinically significant change.  No invasive studies, angiography or surgery at this time The patient should continue walking and begin a more formal exercise program.  The patient should continue antiplatelet therapy and aggressive treatment of the lipid abnormalities  No changes in the patient's medications at this time  The patient should continue wearing graduated compression socks 10-15 mmHg strength to control the mild edema.    4. Coronary artery disease of native artery of native heart with stable angina pectoris (HCC) Continue cardiac and antihypertensive medications as already ordered and reviewed, no changes at this time.  Continue statin as ordered and reviewed, no changes at this time  Nitrates PRN for chest pain   5. Essential hypertension Continue antihypertensive medications as already ordered, these medications have been reviewed and there are no changes at this time.     Levora DredgeGregory Schnier, MD  02/04/2018 4:21 PM

## 2018-02-05 ENCOUNTER — Other Ambulatory Visit: Payer: Self-pay | Admitting: Cardiovascular Disease

## 2018-02-12 ENCOUNTER — Other Ambulatory Visit: Payer: Self-pay

## 2018-02-13 ENCOUNTER — Other Ambulatory Visit: Payer: Self-pay | Admitting: Cardiovascular Disease

## 2018-02-13 NOTE — Telephone Encounter (Signed)
Please review for refill, Thanks !  

## 2018-02-14 ENCOUNTER — Telehealth: Payer: Self-pay | Admitting: Family Medicine

## 2018-02-14 MED ORDER — GLIPIZIDE 5 MG PO TABS: ORAL_TABLET | ORAL | 1 refills | Status: DC

## 2018-02-14 MED ORDER — GLIPIZIDE 5 MG PO TABS: 5.0000 mg | ORAL_TABLET | Freq: Every day | ORAL | 3 refills | Status: DC

## 2018-02-14 NOTE — Telephone Encounter (Signed)
glipizide refill Last Refill:08/14/17 # 90 1 RF Last OV: 12/25/17 PCP:  Dr Para Marchuncan Pharmacy:[facility] [address] Last Hgb A1C: 12/25/17: 8.3

## 2018-02-14 NOTE — Telephone Encounter (Signed)
Sent. Thanks.   

## 2018-02-14 NOTE — Addendum Note (Signed)
Addended by: Joaquim NamUNCAN, Randel Hargens S on: 02/14/2018 02:16 PM   Modules accepted: Orders

## 2018-02-14 NOTE — Telephone Encounter (Signed)
Copied from CRM [phone]. Topic: Quick Communication - Rx Refill/Question >> Feb 14, 2018  1:40 PM Laural BenesJohnson, Louisianahiquita C wrote: Medication: glipiZIDE (GLUCOTROL) 5 MG tablet  Has the patient contacted their pharmacy? No  (Agent: If no, request that the patient contact the pharmacy for the refill.) (Agent: If yes, when and what did the pharmacy advise?)  Preferred Pharmacy (with phone number or street name): [id number] [place] - [address] [facility] [address] [phone] (Phone) [phone] (Fax)      Agent: Please be advised that RX refills may take up to 3 business days. We ask that you follow-up with your pharmacy.

## 2018-02-17 ENCOUNTER — Other Ambulatory Visit: Payer: Self-pay | Admitting: Cardiovascular Disease

## 2018-03-24 ENCOUNTER — Other Ambulatory Visit: Payer: Self-pay | Admitting: Family Medicine

## 2018-04-09 DIAGNOSIS — E113393 Type 2 diabetes mellitus with moderate nonproliferative diabetic retinopathy without macular edema, bilateral: Secondary | ICD-10-CM | POA: Diagnosis not present

## 2018-04-09 DIAGNOSIS — H43813 Vitreous degeneration, bilateral: Secondary | ICD-10-CM | POA: Diagnosis not present

## 2018-04-09 DIAGNOSIS — H43821 Vitreomacular adhesion, right eye: Secondary | ICD-10-CM | POA: Diagnosis not present

## 2018-04-09 DIAGNOSIS — H353131 Nonexudative age-related macular degeneration, bilateral, early dry stage: Secondary | ICD-10-CM | POA: Diagnosis not present

## 2018-04-09 DIAGNOSIS — H353122 Nonexudative age-related macular degeneration, left eye, intermediate dry stage: Secondary | ICD-10-CM | POA: Diagnosis not present

## 2018-04-09 DIAGNOSIS — H353211 Exudative age-related macular degeneration, right eye, with active choroidal neovascularization: Secondary | ICD-10-CM | POA: Diagnosis not present

## 2018-05-07 DIAGNOSIS — H353122 Nonexudative age-related macular degeneration, left eye, intermediate dry stage: Secondary | ICD-10-CM | POA: Diagnosis not present

## 2018-05-07 DIAGNOSIS — H43813 Vitreous degeneration, bilateral: Secondary | ICD-10-CM | POA: Diagnosis not present

## 2018-05-07 DIAGNOSIS — H353211 Exudative age-related macular degeneration, right eye, with active choroidal neovascularization: Secondary | ICD-10-CM | POA: Diagnosis not present

## 2018-06-04 DIAGNOSIS — H353211 Exudative age-related macular degeneration, right eye, with active choroidal neovascularization: Secondary | ICD-10-CM | POA: Diagnosis not present

## 2018-06-11 ENCOUNTER — Telehealth: Payer: Self-pay | Admitting: Family Medicine

## 2018-06-11 NOTE — Telephone Encounter (Signed)
Best number [phone]  After [phone]  Pt called stating after 07/11/18 his drug company will not pay for symbicort hfa arsenal.   But they will pay for advair diskus advair hfa.  If you are ok with him switching with advair please send rx to [facility] on [address] in [place].

## 2018-06-12 MED ORDER — FLUTICASONE-SALMETEROL 250-50 MCG/DOSE IN AEPB: 1.0000 | INHALATION_SPRAY | Freq: Two times a day (BID) | RESPIRATORY_TRACT | 3 refills | Status: DC

## 2018-06-12 NOTE — Telephone Encounter (Signed)
Patient advised.

## 2018-06-12 NOTE — Telephone Encounter (Signed)
I am okay with the change.  rx sent.  Rinse after use.  Update me if med isn't tolerated or if it isn't working as well.   Thanks.

## 2018-06-14 ENCOUNTER — Other Ambulatory Visit: Payer: Self-pay | Admitting: *Deleted

## 2018-06-14 MED ORDER — FENOFIBRATE 145 MG PO TABS: 145.0000 mg | ORAL_TABLET | Freq: Every day | ORAL | 0 refills | Status: DC

## 2018-06-18 ENCOUNTER — Other Ambulatory Visit: Payer: Self-pay | Admitting: Cardiovascular Disease

## 2018-07-09 ENCOUNTER — Ambulatory Visit (INDEPENDENT_AMBULATORY_CARE_PROVIDER_SITE_OTHER): Payer: Medicare Other

## 2018-07-09 ENCOUNTER — Other Ambulatory Visit (INDEPENDENT_AMBULATORY_CARE_PROVIDER_SITE_OTHER): Payer: Self-pay | Admitting: Vascular Surgery

## 2018-07-09 ENCOUNTER — Other Ambulatory Visit: Payer: Self-pay

## 2018-07-09 ENCOUNTER — Encounter (INDEPENDENT_AMBULATORY_CARE_PROVIDER_SITE_OTHER): Payer: Self-pay | Admitting: Vascular Surgery

## 2018-07-09 ENCOUNTER — Ambulatory Visit (INDEPENDENT_AMBULATORY_CARE_PROVIDER_SITE_OTHER): Payer: Medicare Other | Admitting: Vascular Surgery

## 2018-07-09 VITALS — BP 111/69 | HR 71 | Resp 16 | Ht 68.0 in | Wt 200.0 lb

## 2018-07-09 DIAGNOSIS — Z7982 Long term (current) use of aspirin: Secondary | ICD-10-CM

## 2018-07-09 DIAGNOSIS — J432 Centrilobular emphysema: Secondary | ICD-10-CM

## 2018-07-09 DIAGNOSIS — I714 Abdominal aortic aneurysm, without rupture, unspecified: Secondary | ICD-10-CM

## 2018-07-09 DIAGNOSIS — I724 Aneurysm of artery of lower extremity: Secondary | ICD-10-CM | POA: Diagnosis not present

## 2018-07-09 DIAGNOSIS — I6523 Occlusion and stenosis of bilateral carotid arteries: Secondary | ICD-10-CM

## 2018-07-09 DIAGNOSIS — I739 Peripheral vascular disease, unspecified: Secondary | ICD-10-CM

## 2018-07-09 DIAGNOSIS — I10 Essential (primary) hypertension: Secondary | ICD-10-CM | POA: Diagnosis not present

## 2018-07-09 DIAGNOSIS — I25118 Atherosclerotic heart disease of native coronary artery with other forms of angina pectoris: Secondary | ICD-10-CM

## 2018-07-09 NOTE — Progress Notes (Signed)
MRN : [id number]  [name] is a 72 y.o. (1946/05/16) male who presents with chief complaint of check on my aneurysms.  History of Present Illness:   The patient is seen for follow up evaluation of carotid stenosis. The carotid stenosis followed by ultrasound.   The patient denies amaurosis fugax. There is no recent history of TIA symptoms or focal motor deficits. There is no prior documented CVA.  The patient is taking enteric-coated aspirin 81 mg daily.  There is no history of migraine headaches. There is no history of seizures.  The patient is also followed for PAD with history of thrombosed popliteal artery aneurysm. There have been no interval changes in lower extremity symptoms. No interval shortening of the patient's claudication distance or development of rest pain symptoms. No new ulcers or wounds have occurred since the last visit.  There have been no significant changes to the patient's overall health care.  The patient denies amaurosis fugax or recent TIA symptoms. There are no recent neurological changes noted. The patient denies history of DVT, PE or superficial thrombophlebitis. The patient denies recent episodes of angina or shortness of breath.  Carotid Duplex done today shows <40% RICA and >80% LICA.  Increased stenosis of the left compared to last study  Aortic Duplex shows an AAA of 2.3 cm no change compared to last duplex  Lower extremity duplex shows a right popliteal artery aneurysm of 0.73 cm and left bypass widely patent no evidence for stenosis.  ABI Rt=1.24 and Lt=1.33 with triphasic signals in the DP and PT bilaterally   No outpatient medications have been marked as taking for the 07/09/18 encounter (Appointment) with Gilda Crease, Latina Craver, MD.    Past Medical History:  Diagnosis Date  . CAD (coronary artery disease)    a. 1999: PCI-->LAD 2/2 MI; b. 2005: inf MI s/p PCI/DES x 4 to RCA; c. Myoview in 12/2005: EF 61%, no evidence for  ischemia; d. cath 10/2014: occluded mLAD w/ L to L and L to R collats, dRCA 95% s/p PCI/DES 0%, mPDA 70%, LCx mild to mod irregs, procedure complicated by inf ST ele in recovery, repeat cath showed acute dRCA stent thrombosis o/w occluded mRPDA, PTCA dRCA, PCI/DES RPDA, aggrastat x 18 hr  . CKD (chronic kidney disease)   . COPD (chronic obstructive pulmonary disease) (HCC)   . DM2 (diabetes mellitus, type 2) (HCC)   . H/O hiatal hernia   . Heart attack (HCC)   . HTN (hypertension)   . Hypercholesterolemia   . Impaired fasting glucose    Elevated after steroid injection  . Osteoarthritis of hip   . Osteoarthritis, knee   . PAD (peripheral artery disease) (HCC)    L fem to below the knee popliteal vein bypass graft  . Peri-rectal abscess 12/27/2011  . Tobacco abuse    Prior    Past Surgical History:  Procedure Laterality Date  . CORONARY ANGIOPLASTY    . CORONARY STENT PLACEMENT  1999, 2006   Multiple, LAD in 1999, RCA in 2005  . FEMORAL-POPLITEAL BYPASS GRAFT  2016   L fem to below the knee popliteal vein bypass graft  . INCISE AND DRAIN ABCESS  2007   on scrotum  . INCISION AND DRAINAGE PERIRECTAL ABSCESS  12/28/2011   Procedure: IRRIGATION AND DEBRIDEMENT PERIRECTAL ABSCESS;  Surgeon: Robyne Askew, MD;  Location: [facility] OR;  Service: General;  Laterality: N/A;    Social History Social History   Tobacco Use  .  Smoking status: Former Smoker    Packs/day: 1.00    Years: 40.00    Pack years: 40.00    Types: Cigarettes    Last attempt to quit: 10/21/1998    Years since quitting: 19.7  . Smokeless tobacco: Never Used  . Tobacco comment: Past smoker, states he will smoke maybe 1 cigarette/ month or so still  Substance Use Topics  . Alcohol use: Yes    Alcohol/week: 0.0 standard drinks    Comment: Occasional beer on the weekends  . Drug use: No    Family History Family History  Problem Relation Age of Onset  . Heart failure Father        CHF  . CAD Mother   . Diabetes  Mother   . Colon cancer Neg Hx   . Prostate cancer Neg Hx     Allergies  Allergen Reactions  . Augmentin [Amoxicillin-Pot Clavulanate] Other (See Comments)    Nausea,vomiting,diarrhea  . Morphine And Related Nausea And Vomiting    Vomiting, GI upset     REVIEW OF SYSTEMS (Negative unless checked)  Constitutional: [] Weight loss  [] Fever  [] Chills Cardiac: [] Chest pain   [] Chest pressure   [] Palpitations   [] Shortness of breath when laying flat   [] Shortness of breath with exertion. Vascular:  [] Pain in legs with walking   [] Pain in legs at rest  [] History of DVT   [] Phlebitis   [] Swelling in legs   [] Varicose veins   [] Non-healing ulcers Pulmonary:   [] Uses home oxygen   [] Productive cough   [] Hemoptysis   [] Wheeze  [] COPD   [] Asthma Neurologic:  [] Dizziness   [] Seizures   [] History of stroke   [] History of TIA  [] Aphasia   [] Vissual changes   [] Weakness or numbness in arm   [] Weakness or numbness in leg Musculoskeletal:   [] Joint swelling   [] Joint pain   [] Low back pain Hematologic:  [] Easy bruising  [] Easy bleeding   [] Hypercoagulable state   [] Anemic Gastrointestinal:  [] Diarrhea   [] Vomiting  [] Gastroesophageal reflux/heartburn   [] Difficulty swallowing. Genitourinary:  [] Chronic kidney disease   [] Difficult urination  [] Frequent urination   [] Blood in urine Skin:  [] Rashes   [] Ulcers  Psychological:  [] History of anxiety   []  History of major depression.  Physical Examination  There were no vitals filed for this visit. There is no height or weight on file to calculate BMI. Gen: WD/WN, NAD Head: [place]/AT, No temporalis wasting.  Ear/Nose/Throat: Hearing grossly intact, nares w/o erythema or drainage Eyes: PER, EOMI, sclera nonicteric.  Neck: Supple, no large masses.   Pulmonary:  Good air movement, no audible wheezing bilaterally, no use of accessory muscles.  Cardiac: RRR, no JVD Vascular: bilateral carotid bruits  Vessel Right Left  Radial Palpable Palpable  Popliteal  Enlarged Palpable 1+ Palpable  PT Palpable Palpable  DP Palpable Palpable  Gastrointestinal: Non-distended. No guarding/no peritoneal signs.  Musculoskeletal: M/S 5/5 throughout.  No deformity or atrophy.  Neurologic: CN 2-12 intact. Symmetrical.  Speech is fluent. Motor exam as listed above. Psychiatric: Judgment intact, Mood & affect appropriate for pt's clinical situation. Dermatologic: No rashes or ulcers noted.  No changes consistent with cellulitis. Lymph : No lichenification or skin changes of chronic lymphedema.  CBC Lab Results  Component Value Date   WBC 12.9 (H) 01/06/2018   HGB 13.9 01/06/2018   HCT 41.0 01/06/2018   MCV 91.4 01/06/2018   PLT 242 01/06/2018    BMET    Component Value Date/Time   NA  139 01/06/2018 1824   NA 134 (L) 10/29/2014 0908   K 3.5 01/06/2018 1824   K 4.3 10/29/2014 0908   CL 103 01/06/2018 1824   CL 103 10/29/2014 0908   CO2 25 01/06/2018 1824   CO2 24 10/29/2014 0908   GLUCOSE 123 (H) 01/06/2018 1824   GLUCOSE 142 (H) 10/29/2014 0908   BUN 29 (H) 01/06/2018 1824   BUN 20 10/29/2014 0908   CREATININE 1.69 (H) 01/06/2018 1824   CREATININE 1.32 (H) 10/29/2014 0908   CREATININE 0.96 01/28/2011 0855   CALCIUM 9.3 01/06/2018 1824   CALCIUM 9.3 10/29/2014 0908   GFRNONAA 39 (L) 01/06/2018 1824   GFRNONAA 55 (L) 10/29/2014 0908   GFRAA 45 (L) 01/06/2018 1824   GFRAA >60 10/29/2014 0908   CrCl cannot be calculated (Patient's most recent lab result is older than the maximum 21 days allowed.).  COAG Lab Results  Component Value Date   INR 0.9 10/24/2014   INR 1.07 12/28/2011    Radiology No results found.   Assessment/Plan 1. Bilateral carotid artery stenosis The patient remains asymptomatic with respect to the carotid stenosis.  However, the patient has now progressed and has a lesion the is >70%.  Patient should undergo CT angiography of the carotid arteries to define the degree of stenosis of the internal carotid arteries  bilaterally and the anatomic suitability for surgery vs. intervention.  If the patient does indeed need surgery cardiac clearance will be required, once cleared the patient will be scheduled for surgery.  The risks, benefits and alternative therapies were reviewed in detail with the patient.  All questions were answered.  The patient agrees to proceed with imaging.  Continue antiplatelet therapy as prescribed. Continue management of CAD, HTN and Hyperlipidemia. Healthy heart diet, encouraged exercise at least 4 times per week.    A total of 30 minutes was spent with this patient and greater than 50% was spent in counseling and coordination of care with the patient.  Discussion included the treatment options for vascular disease including indications for surgery and intervention.  Also discussed is the appropriate timing of treatment.  In addition medical therapy was discussed.  - CT ANGIO NECK W OR WO CONTRAST; Future  2. Popliteal artery aneurysm (HCC) Recommend:  The patient has evidence of atherosclerosis of the lower extremities with claudication.  The patient does not voice lifestyle limiting changes at this point in time.  Noninvasive studies do not suggest clinically significant change.  No invasive studies, angiography or surgery at this time The patient should continue walking and begin a more formal exercise program.  The patient should continue antiplatelet therapy and aggressive treatment of the lipid abnormalities  No changes in the patient's medications at this time  The patient should continue wearing graduated compression socks 10-15 mmHg strength to control the mild edema.   3. AAA (abdominal aortic aneurysm) without rupture (HCC) No surgery or intervention at this time. The patient has an asymptomatic abdominal aortic aneurysm that is less than 4 cm in maximal diameter.  I have discussed the natural history of abdominal aortic aneurysm and the small risk of  rupture for aneurysm less than 5 cm in size.  However, as these small aneurysms tend to enlarge over time, continued surveillance with ultrasound or CT scan is mandatory.  I have also discussed optimizing medical management with hypertension and lipid control and the importance of abstinence from tobacco.  The patient is also encouraged to exercise a minimum of 30 minutes  4 times a week.  Should the patient develop new onset abdominal or back pain or signs of peripheral embolization they are instructed to seek medical attention immediately and to alert the physician providing care that they have an aneurysm.  The patient voices their understanding. The patient will return in 12 months with an aortic duplex.   4. PAD (peripheral artery disease) (HCC) Recommend:  The patient has evidence of atherosclerosis of the lower extremities with claudication.  The patient does not voice lifestyle limiting changes at this point in time.  Noninvasive studies do not suggest clinically significant change.  No invasive studies, angiography or surgery at this time The patient should continue walking and begin a more formal exercise program.  The patient should continue antiplatelet therapy and aggressive treatment of the lipid abnormalities  No changes in the patient's medications at this time  The patient should continue wearing graduated compression socks 10-15 mmHg strength to control the mild edema.   5. Coronary artery disease of native artery of native heart with stable angina pectoris (HCC) Continue cardiac and antihypertensive medications as already ordered and reviewed, no changes at this time.  Continue statin as ordered and reviewed, no changes at this time  Nitrates PRN for chest pain   6. Essential hypertension Continue antihypertensive medications as already ordered, these medications have been reviewed and there are no changes at this time.   7. Centrilobular emphysema  (HCC) Continue pulmonary medications and aerosols as already ordered, these medications have been reviewed and there are no changes at this time.     Levora DredgeGregory Schnier, MD  07/09/2018 10:03 AM

## 2018-07-11 DIAGNOSIS — H3530 Unspecified macular degeneration: Secondary | ICD-10-CM

## 2018-07-11 HISTORY — DX: Unspecified macular degeneration: H35.30

## 2018-07-12 ENCOUNTER — Other Ambulatory Visit: Payer: Self-pay | Admitting: Cardiovascular Disease

## 2018-07-13 ENCOUNTER — Other Ambulatory Visit: Payer: Self-pay | Admitting: Cardiovascular Disease

## 2018-07-13 DIAGNOSIS — H353122 Nonexudative age-related macular degeneration, left eye, intermediate dry stage: Secondary | ICD-10-CM | POA: Diagnosis not present

## 2018-07-13 DIAGNOSIS — H353211 Exudative age-related macular degeneration, right eye, with active choroidal neovascularization: Secondary | ICD-10-CM | POA: Diagnosis not present

## 2018-07-18 ENCOUNTER — Ambulatory Visit: Admission: RE | Admit: 2018-07-18 | Payer: Medicare Other | Source: Ambulatory Visit

## 2018-07-24 ENCOUNTER — Ambulatory Visit
Admission: RE | Admit: 2018-07-24 | Discharge: 2018-07-24 | Disposition: A | Discharge status: Home / Self Care | Payer: Medicare Other | Source: Ambulatory Visit | Attending: Vascular Surgery | Admitting: Vascular Surgery

## 2018-07-24 DIAGNOSIS — I6523 Occlusion and stenosis of bilateral carotid arteries: Secondary | ICD-10-CM | POA: Diagnosis not present

## 2018-07-24 LAB — POCT I-STAT CREATININE: Creatinine, Ser: 1.8 mg/dL — ABNORMAL HIGH (ref 0.61–1.24)

## 2018-07-24 MED ORDER — IOPAMIDOL (ISOVUE-370) INJECTION 76%
60.0000 mL | Freq: Once | INTRAVENOUS | Status: AC | PRN
  Administered 2018-07-24: 60 mL via INTRAVENOUS

## 2018-07-25 ENCOUNTER — Telehealth: Payer: Self-pay

## 2018-07-25 NOTE — Telephone Encounter (Signed)
Pt had CT scan done with kidney function test on 07/24/18 and pt was advised by imaging personnel to contact PCP about kidney function test results.Dr Gilda Crease ordered test. Pt request cb after Dr Para March reviews instead of scheduling an appt to come in and talk about results.Please advise.

## 2018-07-26 ENCOUNTER — Telehealth (INDEPENDENT_AMBULATORY_CARE_PROVIDER_SITE_OTHER): Payer: Self-pay | Admitting: Vascular Surgery

## 2018-07-26 NOTE — Telephone Encounter (Signed)
PATIENT HAS A APPOINTMENT ON 07-30-2018

## 2018-07-26 NOTE — Telephone Encounter (Signed)
[name] left a vm on nurse line just informing us that he had his CT scan on Tuesday and wasn't sure if we were aware.

## 2018-07-26 NOTE — Telephone Encounter (Signed)
Patient advised. Appointment scheduled.  

## 2018-07-26 NOTE — Telephone Encounter (Signed)
Please thank the patient for informing us.  It looks like he had a carotid CT.  Tammy, please call and get him scheduled for an appointment with Dr. Gilda Crease to review his CT. Thanks!

## 2018-07-26 NOTE — Telephone Encounter (Signed)
His creatinine was slightly worse than prev.  Would expect some decrease in kidney function at baseline given h/o vascular disease, age >51, diabetes, etc.  Would avoid nsaids, drink plenty of water, and needs f/u here re: DM2 with labs to be done at the visit.  We can recheck his renal function then and go from there.  Needs 30 min OV. Doesn't have to fast.   I am awaiting input from vascular clinic about his carotid testing.  Thanks.

## 2018-07-30 ENCOUNTER — Other Ambulatory Visit: Payer: Self-pay

## 2018-07-30 ENCOUNTER — Encounter (INDEPENDENT_AMBULATORY_CARE_PROVIDER_SITE_OTHER): Payer: Self-pay | Admitting: Vascular Surgery

## 2018-07-30 ENCOUNTER — Ambulatory Visit (INDEPENDENT_AMBULATORY_CARE_PROVIDER_SITE_OTHER): Payer: Medicare Other | Admitting: Vascular Surgery

## 2018-07-30 VITALS — BP 116/62 | HR 67 | Resp 19 | Ht 68.0 in | Wt 202.0 lb

## 2018-07-30 DIAGNOSIS — I714 Abdominal aortic aneurysm, without rupture, unspecified: Secondary | ICD-10-CM

## 2018-07-30 DIAGNOSIS — Z7982 Long term (current) use of aspirin: Secondary | ICD-10-CM | POA: Diagnosis not present

## 2018-07-30 DIAGNOSIS — I10 Essential (primary) hypertension: Secondary | ICD-10-CM | POA: Diagnosis not present

## 2018-07-30 DIAGNOSIS — J432 Centrilobular emphysema: Secondary | ICD-10-CM

## 2018-07-30 DIAGNOSIS — I25118 Atherosclerotic heart disease of native coronary artery with other forms of angina pectoris: Secondary | ICD-10-CM | POA: Diagnosis not present

## 2018-07-30 DIAGNOSIS — I6523 Occlusion and stenosis of bilateral carotid arteries: Secondary | ICD-10-CM

## 2018-07-30 DIAGNOSIS — I724 Aneurysm of artery of lower extremity: Secondary | ICD-10-CM

## 2018-07-30 NOTE — Progress Notes (Signed)
MRN : [id number]  [name] is a 73 y.o. (07/19/1945) male who presents with chief complaint of  Chief Complaint  Patient presents with  . Follow-up    CT results  .  History of Present Illness:   The patient is seen for follow up evaluation of carotid stenosis status post CT angiogram. CT scan was done 07/24/2018. Patient reports that the test went well with no problems or complications.   The patient denies interval amaurosis fugax. There is no recent or interval TIA symptoms or focal motor deficits. There is no prior documented CVA.  The patient is taking enteric-coated aspirin 81 mg daily.  There is no history of migraine headaches. There is no history of seizures.  The patient has a history of coronary artery disease, no recent episodes of angina or shortness of breath. The patient denies PAD or claudication symptoms. There is a history of hyperlipidemia which is being treated with a statin.    CT angiogram is reviewed by me personally and shows >57% LICA stenosis consistent with calcified plaque at the origin of the left internal carotid artery. There is <40% RICA stenosis, bilateral approximately 50% stenosis of the distal internal carotids in the area of the siphon.  Current Meds  Medication Sig  . albuterol (PROAIR HFA) 108 (90 BASE) MCG/ACT inhaler Inhale 2 puffs into the lungs 3 (three) times daily as needed (cough).  Marland Kitchen aspirin 81 MG EC tablet Take 1 tablet (81 mg total) by mouth daily.  Marland Kitchen atorvastatin (LIPITOR) 40 MG tablet Take 1 tablet (40 mg total) by mouth daily.  . blood glucose meter kit and supplies KIT 1 each by Does not apply route daily as needed. Dispense based on patient and insurance preference. Use up to four times daily as directed. (FOR ICD-9 250.00, 250.01).  . BRILINTA 60 MG TABS tablet TAKE 1 TABLET BY MOUTH TWICE DAILY  . fenofibrate (TRICOR) 145 MG tablet TAKE 1 TABLET(145 MG) BY MOUTH DAILY  . Fluticasone-Salmeterol (ADVAIR DISKUS) 250-50  MCG/DOSE AEPB Inhale 1 puff into the lungs 2 (two) times daily.  Marland Kitchen glipiZIDE (GLUCOTROL) 5 MG tablet Take 1 tablet (5 mg total) by mouth daily before breakfast.  . glucosamine-chondroitin 500-400 MG tablet Take 1 tablet by mouth 2 (two) times daily.  . hydrochlorothiazide (HYDRODIURIL) 25 MG tablet Take 1 tablet (25 mg total) by mouth daily.  . Insulin Glargine (LANTUS SOLOSTAR) 100 UNIT/ML Solostar Pen Inject up to 35 units per day  . Insulin Pen Needle (B-D UF III MINI PEN NEEDLES) 31G X 5 MM MISC Use as instructed to inject insulin.  Diagnosis:  250.00  Insulin dependent.  Marland Kitchen lisinopril (PRINIVIL,ZESTRIL) 40 MG tablet Take 1 tablet (40 mg total) by mouth daily.  . meclizine (ANTIVERT) 25 MG tablet Take 25 mg by mouth every 6 (six) hours as needed. For dizziness/vertigo  . metFORMIN (GLUCOPHAGE) 500 MG tablet TAKE 2 TABLETS BY MOUTH TWICE A DAY WITH A MEAL  . metoprolol tartrate (LOPRESSOR) 25 MG tablet TAKE 1/2 (ONE-HALF) TABLET BY MOUTH TWICE DAILY  . Multiple Vitamins-Minerals (PRESERVISION AREDS 2 PO) Take 1 tablet by mouth 2 (two) times daily.  . nitroGLYCERIN (NITROSTAT) 0.4 MG SL tablet Place 1 tablet (0.4 mg total) under the tongue every 5 (five) minutes as needed. For chest pain  . traMADol (ULTRAM) 50 MG tablet Take 1 tablet (50 mg total) by mouth every 6 (six) hours as needed.    Past Medical History:  Diagnosis Date  .  CAD (coronary artery disease)    a. 1999: PCI-->LAD 2/2 MI; b. 2005: inf MI s/p PCI/DES x 4 to RCA; c. Myoview in 12/2005: EF 61%, no evidence for ischemia; d. cath 10/2014: occluded mLAD w/ L to L and L to R collats, dRCA 95% s/p PCI/DES 0%, mPDA 70%, LCx mild to mod irregs, procedure complicated by inf ST ele in recovery, repeat cath showed acute dRCA stent thrombosis o/w occluded mRPDA, PTCA dRCA, PCI/DES RPDA, aggrastat x 18 hr  . CKD (chronic kidney disease)   . COPD (chronic obstructive pulmonary disease) ([place])   . DM2 (diabetes mellitus, type 2) ([place])   . H/O  hiatal hernia   . Heart attack ([place])   . HTN (hypertension)   . Hypercholesterolemia   . Impaired fasting glucose    Elevated after steroid injection  . Osteoarthritis of hip   . Osteoarthritis, knee   . PAD (peripheral artery disease) ([facility])    L fem to below the knee popliteal vein bypass graft  . Peri-rectal abscess 12/27/2011  . Tobacco abuse    Prior    Past Surgical History:  Procedure Laterality Date  . CORONARY ANGIOPLASTY    . [place], 2006   Multiple, LAD in 1999, RCA in 2005  . FEMORAL-POPLITEAL BYPASS GRAFT  2016   L fem to below the knee popliteal vein bypass graft  . INCISE AND DRAIN ABCESS  2007   on scrotum  . INCISION AND DRAINAGE PERIRECTAL ABSCESS  12/28/2011   Procedure: IRRIGATION AND DEBRIDEMENT PERIRECTAL ABSCESS;  Surgeon: Merrie Roof, MD;  Location: [facility];  Service: General;  Laterality: N/A;    Social History Social History   Tobacco Use  . Smoking status: Former Smoker    Packs/day: 1.00    Years: 40.00    Pack years: 40.00    Types: Cigarettes    Last attempt to quit: 10/21/1998    Years since quitting: 19.7  . Smokeless tobacco: Never Used  . Tobacco comment: Past smoker, states he will smoke maybe 1 cigarette/ month or so still  Substance Use Topics  . Alcohol use: Yes    Alcohol/week: 0.0 standard drinks    Comment: Occasional beer on the weekends  . Drug use: No    Family History Family History  Problem Relation Age of Onset  . Heart failure Father        CHF  . CAD Mother   . Diabetes Mother   . Colon cancer Neg Hx   . Prostate cancer Neg Hx     Allergies  Allergen Reactions  . Augmentin [Amoxicillin-Pot Clavulanate] Other (See Comments)    Nausea,vomiting,diarrhea  . Morphine And Related Nausea And Vomiting    Vomiting, GI upset     REVIEW OF SYSTEMS (Negative unless checked)  Constitutional: [] Weight loss  [] Fever  [] Chills Cardiac: [] Chest pain   [] Chest pressure   [] Palpitations    [] Shortness of breath when laying flat   [x] Shortness of breath with exertion. Vascular:  [x] Pain in legs with walking   [] Pain in legs at rest  [] History of DVT   [] Phlebitis   [] Swelling in legs   [] Varicose veins   [] Non-healing ulcers Pulmonary:   [] Uses home oxygen   [] Productive cough   [] Hemoptysis   [] Wheeze  [x] COPD   [] Asthma Neurologic:  [] Dizziness   [] Seizures   [] History of stroke   [] History of TIA  [] Aphasia   [] Vissual changes   [] Weakness or numbness  in arm   [] Weakness or numbness in leg Musculoskeletal:   [] Joint swelling   [] Joint pain   [] Low back pain Hematologic:  [] Easy bruising  [] Easy bleeding   [] Hypercoagulable state   [] Anemic Gastrointestinal:  [] Diarrhea   [] Vomiting  [] Gastroesophageal reflux/heartburn   [] Difficulty swallowing. Genitourinary:  [] Chronic kidney disease   [] Difficult urination  [] Frequent urination   [] Blood in urine Skin:  [] Rashes   [] Ulcers  Psychological:  [] History of anxiety   []  History of major depression.  Physical Examination  Vitals:   07/30/18 0847  BP: 116/62  Pulse: 67  Resp: 19  Weight: 202 lb (91.6 kg)  Height: 5' 8"  (1.727 m)   Body mass index is 30.71 kg/m. Gen: WD/WN, NAD Head: [place]/AT, No temporalis wasting.  Ear/Nose/Throat: Hearing grossly intact, nares w/o erythema or drainage Eyes: PER, EOMI, sclera nonicteric.  Neck: Supple, no large masses.   Pulmonary:  Good air movement, no audible wheezing bilaterally, no use of accessory muscles.  Cardiac: RRR, no JVD Vascular: left carotid bruit Vessel Right Left  Radial Palpable Palpable  Brachial Palpable Palpable  Carotid Palpable Palpable  Gastrointestinal: Non-distended. No guarding/no peritoneal signs.  Musculoskeletal: M/S 5/5 throughout.  No deformity or atrophy.  Neurologic: CN 2-12 intact. Symmetrical.  Speech is fluent. Motor exam as listed above. Psychiatric: Judgment intact, Mood & affect appropriate for pt's clinical situation. Dermatologic: No rashes or  ulcers noted.  No changes consistent with cellulitis. Lymph : No lichenification or skin changes of chronic lymphedema.  CBC Lab Results  Component Value Date   WBC 12.9 (H) 01/06/2018   HGB 13.9 01/06/2018   HCT 41.0 01/06/2018   MCV 91.4 01/06/2018   PLT 242 01/06/2018    BMET    Component Value Date/Time   NA 139 01/06/2018 1824   NA 134 (L) 10/29/2014 0908   K 3.5 01/06/2018 1824   K 4.3 10/29/2014 0908   CL 103 01/06/2018 1824   CL 103 10/29/2014 0908   CO2 25 01/06/2018 1824   CO2 24 10/29/2014 0908   GLUCOSE 123 (H) 01/06/2018 1824   GLUCOSE 142 (H) 10/29/2014 0908   BUN 29 (H) 01/06/2018 1824   BUN 20 10/29/2014 0908   CREATININE 1.80 (H) 07/24/2018 1612   CREATININE 1.32 (H) 10/29/2014 0908   CREATININE 0.96 01/28/2011 0855   CALCIUM 9.3 01/06/2018 1824   CALCIUM 9.3 10/29/2014 0908   GFRNONAA 39 (L) 01/06/2018 1824   GFRNONAA 55 (L) 10/29/2014 0908   GFRAA 45 (L) 01/06/2018 1824   GFRAA >60 10/29/2014 0908   Estimated Creatinine Clearance: 40.2 mL/min (A) (by C-G formula based on SCr of 1.8 mg/dL (H)).  COAG Lab Results  Component Value Date   INR 0.9 10/24/2014   INR 1.07 12/28/2011    Radiology Ct Angio Neck W Or Wo Contrast  Result Date: 07/24/2018 CLINICAL DATA:  73 year old male with peripheral vascular disease. Bilateral carotid stenosis on ultrasound, severe on the left. EXAM: CT ANGIOGRAPHY NECK TECHNIQUE: Multidetector CT imaging of the neck was performed using the standard protocol during bolus administration of intravenous contrast. Multiplanar CT image reconstructions and MIPs were obtained to evaluate the vascular anatomy. Carotid stenosis measurements (when applicable) are obtained utilizing NASCET criteria, using the distal internal carotid diameter as the denominator. CONTRAST:  16m ISOVUE-370 IOPAMIDOL (ISOVUE-370) INJECTION 76% COMPARISON:  No prior neck imaging. CTA abdomen runoff 08/29/2014. FINDINGS: Skeleton: Degenerative changes in  the visible spine. No acute osseous abnormality identified. Visible paranasal sinuses are generally well  pneumatized. There is mucosal thickening and bubbly opacity in the posterior right ethmoid and left sphenoid. Tympanic cavities and mastoids are clear. Upper chest: Occasional subpleural reticular opacity in the upper lungs, subtle distal airway nodularity. This is likely postinflammatory and there is less pulmonary nodularity than demonstrated on the 2016 comparison in the mid and lower lungs. No superior mediastinal lymphadenopathy. Other neck: Negative Neck. Negative visible brain parenchyma, orbits. Aortic arch: 3 vessel arch configuration. Mild for age arch and proximal great vessel atherosclerosis. Right carotid system: No brachiocephalic artery stenosis despite some soft and calcified plaque. Minimal plaque at the right CCA origin without stenosis. Intermittent soft plaque in the right CCA without stenosis. Soft and calcified plaque at the right carotid bifurcation and continuing into the right ICA origin and bulb results in less than 50 % stenosis with respect to the distal vessel. Visible right ICA siphon is calcified, with up to 50% right supraclinoid ICA stenosis on series 8, image 267. Left carotid system: No left CCA stenosis despite some plaque. Intermittent soft plaque proximal to the bifurcation without stenosis. Bulky soft and calcified plaque at the bifurcation and left ICA origin without stenosis. But separate bulky soft and calcified plaque results in radiographic string sign stenosis at the distal bulb as demonstrated on series 12, image 178. The high-grade stenosis is over a segment of about 4 millimeters. The left ICA remains patent with a mildly decreased caliber compared to the opposite side. The visible left ICA siphon is patent with moderate calcified plaque, and similar appearing visible supraclinoid segment stenosis on image 267. Vertebral arteries: Bulky soft plaque in the right  subclavian artery origin with less than 50 % stenosis with respect to the distal vessel. Calcified plaque at the right vertebral artery origin with moderate to severe stenosis (series 10, image 120). The right vertebral is slightly non dominant and patent to the vertebrobasilar junction without additional stenosis. The right PICA origin is normal. The visible basilar artery is patent but diminutive and this appears to beyond the basis of bilateral fetal type PCA origins. Proximal left subclavian artery soft and calcified plaque with less than 50% stenosis. Normal left vertebral artery origin. Mildly dominant left vertebral artery is patent to the vertebrobasilar junction without stenosis. Normal left PICA origin. Review of the MIP images confirms the above findings IMPRESSION: 1. High-grade RADIOGRAPHIC STRING SIGN stenosis of the Left ICA bulb from bulky soft and calcified plaque. The Left ICA remains patent with a mildly decreased caliber compared to the opposite side. 2. Bulky plaque at the Right carotid bifurcation without significant stenosis. 3. Note calcified bilateral ICA siphon plaque resulting in approximately 50% stenosis of both visible supraclinoid ICA segments. 4. Right Vertebral artery origin moderate to severe stenosis due to calcified plaque. Negative left vertebral artery. Electronically Signed   By: Genevie Ann M.D.   On: 07/24/2018 19:23   Vas [place] Burnard Bunting With/wo Tbi  Result Date: 07/09/2018 LOWER EXTREMITY DOPPLER STUDY Indications: Peripheral artery disease, and Hx Left pop art aneurysm and              repair.  Vascular Interventions: 2016 Left fempop graft for pop art repair. Comparison Study: 07/10/2017 Performing Technologist: Concha Norway RVT  Examination Guidelines: A complete evaluation includes at minimum, Doppler waveform signals and systolic blood pressure reading at the level of bilateral brachial, anterior tibial, and posterior tibial arteries, when vessel segments are accessible.  Bilateral testing is considered an integral part of a complete examination. Photoelectric Plethysmograph (PPG)  waveforms and toe systolic pressure readings are included as required and additional duplex testing as needed. Limited examinations for reoccurring indications may be performed as noted.  ABI Findings: +---------+------------------+-----+---------+--------+ Right    Rt Pressure (mmHg)IndexWaveform Comment  +---------+------------------+-----+---------+--------+ Brachial 115                                      +---------+------------------+-----+---------+--------+ ATA      143               1.24 triphasic         +---------+------------------+-----+---------+--------+ PTA      140               1.22 triphasic         +---------+------------------+-----+---------+--------+ Great Toe109               0.95 Normal            +---------+------------------+-----+---------+--------+ +--------+------------------+-----+---------+-------+ Left    Lt Pressure (mmHg)IndexWaveform Comment +--------+------------------+-----+---------+-------+ [id number]                                     +--------+------------------+-----+---------+-------+ ATA     153               1.33 triphasic        +--------+------------------+-----+---------+-------+ PTA     151               1.31 triphasic        +--------+------------------+-----+---------+-------+ +-------+-----------+-----------+------------+------------+ ABI/TBIToday's ABIToday's TBIPrevious ABIPrevious TBI +-------+-----------+-----------+------------+------------+ Right  1.24       .95        1.25        .97          +-------+-----------+-----------+------------+------------+ Left   1.33       .85        1.21        .79          +-------+-----------+-----------+------------+------------+ Bilateral ABIs and TBIs appear essentially unchanged compared to prior study on 07/10/2017.  Summary: Right:  Resting right ankle-brachial index is within normal range. No evidence of significant right lower extremity arterial disease. The right toe-brachial index is normal. Left: Resting left ankle-brachial index is within normal range. No evidence of significant left lower extremity arterial disease. The left toe-brachial index is normal.  *See table(s) above for measurements and observations.  Electronically signed by Hortencia Pilar MD on 07/09/2018 at 4:42:45 PM.   Final    Vas US Carotid  Result Date: 07/09/2018 Carotid Arterial Duplex Study Indications:       CVD. Risk Factors:      Diabetes, PAD. Comparison Study:  01/24/2018 Performing Technologist: Concha Norway RVT  Examination Guidelines: A complete evaluation includes B-mode imaging, spectral Doppler, color Doppler, and power Doppler as needed of all accessible portions of each vessel. Bilateral testing is considered an integral part of a complete examination. Limited examinations for reoccurring indications may be performed as noted.  Right Carotid Findings: +----------+--------+--------+--------+--------+--------+           PSV cm/sEDV cm/sStenosisDescribeComments +----------+--------+--------+--------+--------+--------+ CCA Prox  44      14                               +----------+--------+--------+--------+--------+--------+ CCA Mid   62  18                               +----------+--------+--------+--------+--------+--------+ CCA Distal69      19                               +----------+--------+--------+--------+--------+--------+ ICA Prox  53      18      1-39%                    +----------+--------+--------+--------+--------+--------+ ICA Mid   53      18                               +----------+--------+--------+--------+--------+--------+ ICA Distal56      19                               +----------+--------+--------+--------+--------+--------+ ECA       177     20                                +----------+--------+--------+--------+--------+--------+ +----------+--------+-------+----------------+-------------------+           PSV cm/sEDV cmsDescribe        Arm Pressure (mmHG) +----------+--------+-------+----------------+-------------------+ [id number]             Multiphasic, WNL                    +----------+--------+-------+----------------+-------------------+ +---------+--------+--+--------+---------+ VertebralPSV cm/s26EDV cm/sAntegrade +---------+--------+--+--------+---------+ ICA/CCA ratio = .85  Left Carotid Findings: +----------+--------+--------+--------+----------------------+--------+           PSV cm/sEDV cm/sStenosisDescribe              Comments +----------+--------+--------+--------+----------------------+--------+ CCA Prox  49      11                                             +----------+--------+--------+--------+----------------------+--------+ CCA Mid   49      10                                             +----------+--------+--------+--------+----------------------+--------+ CCA Distal56      16                                             +----------+--------+--------+--------+----------------------+--------+ ICA Prox  43      13                                             +----------+--------+--------+--------+----------------------+--------+ ICA Mid   260     112     80-99%  irregular and calcific         +----------+--------+--------+--------+----------------------+--------+ ICA Distal55      12                                             +----------+--------+--------+--------+----------------------+--------+  ECA       101     10                                             +----------+--------+--------+--------+----------------------+--------+ +----------+--------+--------+----------------+-------------------+ SubclavianPSV cm/sEDV cm/sDescribe        Arm Pressure (mmHG)  +----------+--------+--------+----------------+-------------------+           79              Multiphasic, WNL                    +----------+--------+--------+----------------+-------------------+ +---------+--------+--+--------+---------+ VertebralPSV cm/s39EDV cm/sAntegrade +---------+--------+--+--------+---------+ ICA/CCA ratio = 5.31  Summary: Right Carotid: Velocities in the right ICA are consistent with a 1-39% stenosis.                Mild initimal thickening and atherosclerosis throughout. Left Carotid: Velocities in the left ICA are consistent with a 80-99% stenosis.               Non-hemodynamically significant plaque noted in the CCA. Moderate               mixed plaque in the proximal to mid ICA. Severe narrowing of               proximal to mid ICA with turbulent flow distally. Vertebrals:  Bilateral vertebral arteries demonstrate antegrade flow. Subclavians: Normal flow hemodynamics were seen in bilateral subclavian              arteries. *See table(s) above for measurements and observations.  Electronically signed by Hortencia Pilar MD on 07/09/2018 at 4:42:33 PM.    Final    Vas [place] Lower Extremity Arterial Duplex  Result Date: 07/09/2018 LOWER EXTREMITY ARTERIAL DUPLEX STUDY Indications: Peripheral artery disease, and Left fempop vein graft 2016.  Current ABI: Rt = 1.24 Lt = 1.33 Comparison Study: 07/10/2017 Performing Technologist: Concha Norway RVT  Examination Guidelines: A complete evaluation includes B-mode imaging, spectral Doppler, color Doppler, and power Doppler as needed of all accessible portions of each vessel. Bilateral testing is considered an integral part of a complete examination. Limited examinations for reoccurring indications may be performed as noted.  Right Duplex Findings: +----------+--------+-----+--------+---------+--------+           PSV cm/sRatioStenosisWaveform Comments +----------+--------+-----+--------+---------+--------+ POP Distal52                    triphasic         +----------+--------+-----+--------+---------+--------+  +---------------+-------+-----------+---------+--------+-----+--------+ Right PoplitealAP (cm)Transv (cm)Waveform StenosisShapeComments +---------------+-------+-----------+---------+--------+-----+--------+ Mid            0.73   0.73       triphasic                      +---------------+-------+-----------+---------+--------+-----+--------+  Left Duplex Findings: +----------+--------+-----+--------+----------+--------+           PSV cm/sRatioStenosisWaveform  Comments +----------+--------+-----+--------+----------+--------+ EIA Distal92                   biphasic           +----------+--------+-----+--------+----------+--------+ CFA Mid   89                   biphasic           +----------+--------+-----+--------+----------+--------+ DFA       84  biphasic           +----------+--------+-----+--------+----------+--------+ SFA Prox  80                   triphasic          +----------+--------+-----+--------+----------+--------+ SFA Mid   59                   triphasic          +----------+--------+-----+--------+----------+--------+ SFA Distal107                  triphasic          +----------+--------+-----+--------+----------+--------+ POP Mid                occluded                   +----------+--------+-----+--------+----------+--------+ ATA Distal15                   monophasic         +----------+--------+-----+--------+----------+--------+ PTA Distal103                  monophasic         +----------+--------+-----+--------+----------+--------+  Left Graft #1: Distal SFA to TP trunk vein graft +--------------------+--------+--------+--------+--------+                     PSV cm/sStenosisWaveformComments +--------------------+--------+--------+--------+--------+ Inflow                                                +--------------------+--------+--------+--------+--------+ Proximal Anastomosis                                 +--------------------+--------+--------+--------+--------+ Proximal Graft      46                               +--------------------+--------+--------+--------+--------+ Mid Graft           49                               +--------------------+--------+--------+--------+--------+ Distal Graft        52                               +--------------------+--------+--------+--------+--------+ Distal Anastamosis                                   +--------------------+--------+--------+--------+--------+ Outflow             35                               +--------------------+--------+--------+--------+--------+  Summary: Right: Limited duplex to popliteal artery. No popliteal artery aneurysm seen. Normal caliber vessel throughout the popliteal artery. Moderate atherosclerosis. Left: Patent Distal SFA to TP trunk vein graft with no evidence of stenosis. Mild atherosclerosis throughout. Occluded popliteal artery.  See table(s) above for measurements and observations. Electronically signed by Hortencia Pilar MD on 07/09/2018 at 4:42:38 PM.    Final    Vas US  Aorta/ivc/iliacs  Result Date: 07/09/2018 ABDOMINAL AORTA STUDY Indications: Abd Ao ectasia  Comparison Study: 01/24/2018 Performing Technologist: Concha Norway RVT  Examination Guidelines: A complete evaluation includes B-mode imaging, spectral Doppler, color Doppler, and power Doppler as needed of all accessible portions of each vessel. Bilateral testing is considered an integral part of a complete examination. Limited examinations for reoccurring indications may be performed as noted.  Abdominal Aorta Findings: +-----------+-------+----------+----------+--------+--------+--------+ Location   AP (cm)Trans (cm)PSV (cm/s)WaveformThrombusComments  +-----------+-------+----------+----------+--------+--------+--------+ Proximal   2.31   2.17      46                                 +-----------+-------+----------+----------+--------+--------+--------+ Mid        1.70   1.73      76                                 +-----------+-------+----------+----------+--------+--------+--------+ Distal     1.65   1.62      85                                 +-----------+-------+----------+----------+--------+--------+--------+ RT CIA Prox1.2    1.1       78                                 +-----------+-------+----------+----------+--------+--------+--------+ LT CIA Prox1.0    1.1       61                                 +-----------+-------+----------+----------+--------+--------+--------+  Summary: Abdominal Aorta: No evidence of an abdominal aortic aneurysm was visualized. The largest aortic measurement is 2.3 cm. No abdominal aneurysm seen. Abdominal aorta tapers from 2.3cm in proximal to 1.65cm in distal. Moderate atherosclerosis throughout. IVC/Iliac: There is no evidence of thrombus involving the IVC.  *See table(s) above for measurements and observations.  Electronically signed by Hortencia Pilar MD on 07/09/2018 at 4:42:42 PM.   Final      Assessment/Plan 1. Bilateral carotid artery stenosis Recommend:  The patient remains asymptomatic with respect to the carotid stenosis.  However, the patient has now progressed and has a left ICA lesion that is >95%.  Patient's CT angiography of the carotid arteries confirms >95% left ICA stenosis.  The anatomical considerations support surgery over stenting.  This was discussed in detail with the patient.  The patient does indeed need surgery, therefore, cardiac clearance will be arranged. Once cleared the patient will be scheduled for surgery.  The risks, benefits and alternative therapies were reviewed in detail with the patient.  All questions were answered.  The patient  agrees to proceed with surgery of the left carotid artery.  Continue antiplatelet therapy as prescribed. Continue management of CAD, HTN and Hyperlipidemia. Healthy heart diet, encouraged exercise at least 4 times per week.    A total of 30 minutes was spent with this patient and greater than 50% was spent in counseling and coordination of care with the patient.  Discussion included the treatment options for vascular disease including indications for surgery and intervention.  Also discussed is the appropriate timing of treatment.  In addition medical therapy was discussed.  -  Ambulatory referral to Cardiology  2. Popliteal artery aneurysm (HCC) No surgery or intervention at this time.  The patient has an asymptomatic popliteal artery aneurysm that is less than 2.5 cm in maximal diameter.  I have discussed the natural history of popliteal aneurysm and the small risk of thrombosis for aneurysm less than 2.5 cm in size.  However, as these small aneurysms tend to enlarge over time, continued surveillance with ultrasound is mandatory.   I have also discussed optimizing medical management with hypertension and lipid control and the importance of abstinence from tobacco.  The patient is also encouraged to exercise a minimum of 30 minutes 4 times a week.   Should the patient develop new leg pain or signs of peripheral embolization they are instructed to seek medical attention immediately and to alert the physician providing care that they have an aneurysm.  The patient voices their understanding.   3. AAA (abdominal aortic aneurysm) without rupture (HCC) No surgery or intervention at this time. The patient has an asymptomatic abdominal aortic aneurysm that is less than 4 cm in maximal diameter.  I have discussed the natural history of abdominal aortic aneurysm and the small risk of rupture for aneurysm less than 5 cm in size.  However, as these small aneurysms tend to enlarge over time, continued  surveillance with ultrasound or CT scan is mandatory.  I have also discussed optimizing medical management with hypertension and lipid control and the importance of abstinence from tobacco.  The patient is also encouraged to exercise a minimum of 30 minutes 4 times a week.  Should the patient develop new onset abdominal or back pain or signs of peripheral embolization they are instructed to seek medical attention immediately and to alert the physician providing care that they have an aneurysm.  The patient voices their understanding. The patient will return in 12 months with an aortic duplex.  4. Coronary artery disease of native artery of native heart with stable angina pectoris (HCC) Continue cardiac and antihypertensive medications as already ordered and reviewed, no changes at this time.  Continue statin as ordered and reviewed, no changes at this time  Nitrates PRN for chest pain   5. Essential hypertension Continue antihypertensive medications as already ordered, these medications have been reviewed and there are no changes at this time.   6. Centrilobular emphysema ([place]) Continue pulmonary medications and aerosols as already ordered, these medications have been reviewed and there are no changes at this time.     Hortencia Pilar, MD  07/30/2018 8:53 AM

## 2018-08-01 ENCOUNTER — Encounter (INDEPENDENT_AMBULATORY_CARE_PROVIDER_SITE_OTHER): Payer: Self-pay | Admitting: Vascular Surgery

## 2018-08-02 ENCOUNTER — Encounter: Payer: Self-pay | Admitting: Family Medicine

## 2018-08-02 ENCOUNTER — Ambulatory Visit (INDEPENDENT_AMBULATORY_CARE_PROVIDER_SITE_OTHER): Payer: Medicare Other | Admitting: Family Medicine

## 2018-08-02 VITALS — BP 130/70 | HR 75 | Temp 97.8°F | Ht 68.0 in | Wt 204.5 lb

## 2018-08-02 DIAGNOSIS — F518 Other sleep disorders not due to a substance or known physiological condition: Secondary | ICD-10-CM | POA: Diagnosis not present

## 2018-08-02 DIAGNOSIS — I6523 Occlusion and stenosis of bilateral carotid arteries: Secondary | ICD-10-CM

## 2018-08-02 DIAGNOSIS — Z23 Encounter for immunization: Secondary | ICD-10-CM | POA: Diagnosis not present

## 2018-08-02 DIAGNOSIS — R7989 Other specified abnormal findings of blood chemistry: Secondary | ICD-10-CM

## 2018-08-02 DIAGNOSIS — E119 Type 2 diabetes mellitus without complications: Secondary | ICD-10-CM

## 2018-08-02 DIAGNOSIS — E1151 Type 2 diabetes mellitus with diabetic peripheral angiopathy without gangrene: Secondary | ICD-10-CM

## 2018-08-02 LAB — POCT GLYCOSYLATED HEMOGLOBIN (HGB A1C): HEMOGLOBIN A1C: 8.4 % — AB (ref 4.0–5.6)

## 2018-08-02 MED ORDER — GLIPIZIDE 5 MG PO TABS: 5.0000 mg | ORAL_TABLET | Freq: Two times a day (BID) | ORAL | 3 refills | Status: DC

## 2018-08-02 MED ORDER — NITROGLYCERIN 0.4 MG SL SUBL: 0.4000 mg | SUBLINGUAL_TABLET | SUBLINGUAL | 12 refills | Status: DC | PRN

## 2018-08-02 NOTE — Progress Notes (Signed)
Diabetes:  Using medications without difficulties: yes Hypoglycemic episodes:no Hyperglycemic episodes:no Blood Sugars averaging: usually ~ 170-180 eye exam within last year: yes A1c stable but still elevated, d/w pt.   Limited exercise except for walking.  Diet is good.    Flu shot done at office visit.  He hasn't changed to advair yet, d/w pt about maintenance vs abortive tx.    He is on injection tx for macular degeneration.    No NTG use but needed refill since rx was old.    Vascular disease d/w pt. no new symptoms.  He is in the midst of work-up through vascular and cardiology.  Creatinine elevation.  He had been taking BCs daily.  D/w pt about changing to tylenol.    Recently with mild creatinine elevation.  Discussed with patient about recheck in the future when well-hydrated and not taking BCs.  He has some vivid dreams but not nightmares.  He did not know if this was med related.  No recent medication changes otherwise.  PMH and SH reviewed  Meds, vitals, and allergies reviewed.   ROS: Per HPI unless specifically indicated in ROS section   GEN: nad, alert and oriented HEENT: mucous membranes moist NECK: supple w/o LA CV: rrr. PULM: ctab, no inc wob ABD: soft, +bs EXT: no edema SKIN: well perfused.

## 2018-08-02 NOTE — Patient Instructions (Addendum)
Stop the BCs and recheck labs in about 1 week.  Use tylenol instead.  Drink enough water to keep your urine clear.   Increase the glipizide to 1 tab twice a day and recheck here in 3 months.  We can do labs at the visit.   Take care.  Glad to see you.

## 2018-08-03 ENCOUNTER — Telehealth (INDEPENDENT_AMBULATORY_CARE_PROVIDER_SITE_OTHER): Payer: Self-pay | Admitting: Vascular Surgery

## 2018-08-03 NOTE — Telephone Encounter (Signed)
Please go to appt desk under the patient and look at the referral to Dr. Windell Hummingbird office, patient has been called once. Please let the patient know.

## 2018-08-05 ENCOUNTER — Telehealth: Payer: Self-pay | Admitting: Family Medicine

## 2018-08-05 ENCOUNTER — Encounter: Payer: Self-pay | Admitting: Family Medicine

## 2018-08-05 DIAGNOSIS — F518 Other sleep disorders not due to a substance or known physiological condition: Secondary | ICD-10-CM | POA: Insufficient documentation

## 2018-08-05 DIAGNOSIS — R7989 Other specified abnormal findings of blood chemistry: Secondary | ICD-10-CM | POA: Insufficient documentation

## 2018-08-05 NOTE — Assessment & Plan Note (Signed)
Per vascular.  I will defer. 

## 2018-08-05 NOTE — Assessment & Plan Note (Signed)
Nightmares can happen less than 2% of the time (per available data) with Lipitor.  I would continue as is as long as patient can tolerate.

## 2018-08-05 NOTE — Assessment & Plan Note (Signed)
A1c stable but still elevated, d/w pt.   Limited exercise except for walking.  Diet is good.   Increase the glipizide to 1 tab twice a day and recheck here in 3 months.  We can do labs at the visit.  Discussed with patient he agrees. >25 minutes spent in face to face time with patient, >50% spent in counselling or coordination of care.

## 2018-08-05 NOTE — Telephone Encounter (Signed)
Notify patient.  Nightmares can happen less than 2% of the time on atorvastatin.  Unclear if this is causing his vivid dreams.  If he can tolerate the medication/dreams then I would continue as is.  Thanks.

## 2018-08-05 NOTE — Assessment & Plan Note (Signed)
Recently with mild creatinine elevation.  Discussed with patient about recheck in the future when well-hydrated and not taking BCs.

## 2018-08-06 NOTE — Telephone Encounter (Signed)
Patient advised and says he can tolerate it.  It has been going on for a long time.

## 2018-08-07 ENCOUNTER — Telehealth (INDEPENDENT_AMBULATORY_CARE_PROVIDER_SITE_OTHER): Payer: Self-pay | Admitting: Vascular Surgery

## 2018-08-07 NOTE — Telephone Encounter (Signed)
Please call the patient back and let him know that he has an appointment with Dr. Marylou Flesher PA, Eula Listen, on 08/28/2017 at 0900.

## 2018-08-07 NOTE — Telephone Encounter (Signed)
See below

## 2018-08-07 NOTE — Telephone Encounter (Signed)
Patient is aware of 08/28/2018 appointment

## 2018-08-08 ENCOUNTER — Other Ambulatory Visit (INDEPENDENT_AMBULATORY_CARE_PROVIDER_SITE_OTHER): Payer: Medicare Other

## 2018-08-08 DIAGNOSIS — IMO0002 Reserved for concepts with insufficient information to code with codable children: Secondary | ICD-10-CM

## 2018-08-08 DIAGNOSIS — R7989 Other specified abnormal findings of blood chemistry: Secondary | ICD-10-CM

## 2018-08-08 LAB — BASIC METABOLIC PANEL
BUN: 20 mg/dL (ref 6–23)
CO2: 27 mEq/L (ref 19–32)
CREATININE: 1.42 mg/dL (ref 0.40–1.50)
Calcium: 9.5 mg/dL (ref 8.4–10.5)
Chloride: 101 mEq/L (ref 96–112)
GFR: 48.83 mL/min — ABNORMAL LOW (ref >60.00)
Glucose, Bld: 205 mg/dL — ABNORMAL HIGH (ref 70–99)
Potassium: 3.7 mEq/L (ref 3.5–5.1)
Sodium: 139 mEq/L (ref 135–145)

## 2018-08-15 DIAGNOSIS — H43813 Vitreous degeneration, bilateral: Secondary | ICD-10-CM | POA: Diagnosis not present

## 2018-08-15 DIAGNOSIS — H43391 Other vitreous opacities, right eye: Secondary | ICD-10-CM | POA: Diagnosis not present

## 2018-08-15 DIAGNOSIS — H353211 Exudative age-related macular degeneration, right eye, with active choroidal neovascularization: Secondary | ICD-10-CM | POA: Diagnosis not present

## 2018-08-15 DIAGNOSIS — H353123 Nonexudative age-related macular degeneration, left eye, advanced atrophic without subfoveal involvement: Secondary | ICD-10-CM | POA: Diagnosis not present

## 2018-08-16 ENCOUNTER — Other Ambulatory Visit: Payer: Self-pay | Admitting: Cardiovascular Disease

## 2018-08-22 ENCOUNTER — Other Ambulatory Visit: Payer: Self-pay | Admitting: Cardiovascular Disease

## 2018-08-27 NOTE — Progress Notes (Signed)
Cardiology Office Note Date:  08/28/2018  Patient ID:  [name], [name] 09-12-1945, MRN [id number] PCP:  Tonia Ghent, MD  Cardiologist:  Dr. Rockey Situ, MD    Chief Complaint: Preoperative cardiac evaluation  History of Present Illness: [name] is a 73 y.o. male with history of CAD status post prior stenting to the LAD and RCA as detailed below, PAD status post left femoral-popliteal bypass in 2016 with bilateral lower extremity ABIs normal and patent distal SFA to TP vein graft in 06/2018, left-sided carotid artery disease, AAA less than 4 cm, popliteal artery aneurysm, DM2, HTN, HLD, COPD secondary to prior tobacco abuse for 50 years, and perirectal abscess who presents for preoperative cardiac evaluation.  Prior MI in 1999 with PCI to the LAD.  Patient suffered an inferior MI in 2005 with reported PCI/DES x4 to the RCA.  Most recent cath from 2016 in the setting of a non-STEMI showed an occluded mid LAD with left to left and left-to-right collaterals as well as distal RCA 95% stenosis status post PCI/DES.  There was residual rPDA 70% stenosis and mild to moderate irregularities in the LCx.  The procedure was complicated by inferior ST elevation MI in recovery with repeat cath showing acute distal RCA stent thrombosis without occluded RPDA.  The patient underwent PTCA of the distal RCA and PCI/DES to the rPDA.  It has been noted the patient's anginal equivalent is scapular pain between his shoulder blades.  Most recent ischemic evaluation via nuclear stress test 08/2016 showed a small defect of mild severity present in the apical anterior and apex location. This was consistent with mild distal LAD ischemia. EF 55-65%. Low risk study.   He was last seen in the office in 07/2017 and noted severe shoulder and hip pain leading to disruption in his sleep.  He continues to eat a bowl of ice cream every night and had a poor diet in general.  The patient recently underwent carotid artery  ultrasound on 07/09/2018 that showed RICA of 1 to 96% stenosis and LICA 80 to 78% stenosis.  The bilateral vertebral arteries were with antegrade flow and there was normal flow hemodynamics seen within the bilateral subclavian arteries.  Follow-up CTA of the neck on 07/24/2018 showed greater than 93% LICA stenosis consistent with calcified plaque at the origin of the left internal carotid artery as well as a less than 40% R ICA stenosis and bilateral approximate 50% stenosis of the distal internal carotids in the area of the siphon.  He has been seen by vascular surgery on 07/30/2018 with recommendation for surgical intervention.  He presents to cardiology today for preoperative cardiac evaluation.  Labs: 07/2018 - A1c 8.4, potassium 3.7, glucose 205, serum creatinine 1.42 12/2017 - WBC 12.9, Hgb 13.9, PLT 242, AST/ALT normal, direct LDL 58  He comes in accompanied by his daughter today is doing well from a cardiac perspective.  No chest pain, shortness of breath, dizziness, presyncope, or syncope.  He has a stable left lower extremity swelling which has been present since his vascular intervention.  He has stable two-pillow orthopnea.  No abdominal distention, early satiety, or PND.  No falls since he was last seen.  No BRBPR or melena.  Blood pressure remains in the 1 teens to 810F systolic.  He is tolerating all medications without issues.  Surgical date is yet to be determined.  He is able to achieve at least 4 METs without cardiac limitation.   Past Medical History:  Diagnosis Date  . CAD (coronary artery disease)    a. 1999: PCI-->LAD 2/2 MI; b. 2005: inf MI s/p PCI/DES x 4 to RCA; c. Myoview in 12/2005: EF 61%, no evidence for ischemia; d. cath 10/2014: occluded mLAD w/ L to L and L to R collats, dRCA 95% s/p PCI/DES 0%, mPDA 70%, LCx mild to mod irregs, procedure complicated by inf ST ele in recovery, repeat cath showed acute dRCA stent thrombosis o/w occluded mRPDA, PTCA dRCA, PCI/DES RPDA, aggrastat  x 18 hr  . CKD (chronic kidney disease)   . COPD (chronic obstructive pulmonary disease) ([place])   . DM2 (diabetes mellitus, type 2) ([place])   . H/O hiatal hernia   . Heart attack ([place])   . HTN (hypertension)   . Hypercholesterolemia   . Impaired fasting glucose    Elevated after steroid injection  . Osteoarthritis of hip   . Osteoarthritis, knee   . PAD (peripheral artery disease) (HCC)    L fem to below the knee popliteal vein bypass graft  . Peri-rectal abscess 12/27/2011  . Tobacco abuse    Prior    Past Surgical History:  Procedure Laterality Date  . CORONARY ANGIOPLASTY    . [place], 2006   Multiple, LAD in 1999, RCA in 2005  . FEMORAL-POPLITEAL BYPASS GRAFT  2016   L fem to below the knee popliteal vein bypass graft  . INCISE AND DRAIN ABCESS  2007   on scrotum  . INCISION AND DRAINAGE PERIRECTAL ABSCESS  12/28/2011   Procedure: IRRIGATION AND DEBRIDEMENT PERIRECTAL ABSCESS;  Surgeon: Luella Cook III, MD;  Location: [facility];  Service: General;  Laterality: N/A;    Current Meds  Medication Sig  . albuterol (PROAIR HFA) 108 (90 BASE) MCG/ACT inhaler Inhale 2 puffs into the lungs 3 (three) times daily as needed (cough).  Marland Kitchen aspirin 81 MG EC tablet Take 1 tablet (81 mg total) by mouth daily.  Marland Kitchen atorvastatin (LIPITOR) 40 MG tablet Take 1 tablet (40 mg total) by mouth daily.  . blood glucose meter kit and supplies KIT 1 each by Does not apply route daily as needed. Dispense based on patient and insurance preference. Use up to four times daily as directed. (FOR ICD-9 250.00, 250.01).  . BRILINTA 60 MG TABS tablet TAKE 1 TABLET BY MOUTH TWICE DAILY  . fenofibrate (TRICOR) 145 MG tablet TAKE 1 TABLET BY MOUTH DAILY  . Fluticasone-Salmeterol (ADVAIR DISKUS) 250-50 MCG/DOSE AEPB Inhale 1 puff into the lungs 2 (two) times daily.  Marland Kitchen glipiZIDE (GLUCOTROL) 5 MG tablet Take 1 tablet (5 mg total) by mouth 2 (two) times daily before a meal.  . glucosamine-chondroitin  500-400 MG tablet Take 1 tablet by mouth 2 (two) times daily.  . hydrochlorothiazide (HYDRODIURIL) 25 MG tablet Take 1 tablet (25 mg total) by mouth daily.  . Insulin Glargine (LANTUS SOLOSTAR) 100 UNIT/ML Solostar Pen Inject up to 35 units per day  . Insulin Pen Needle (B-D UF III MINI PEN NEEDLES) 31G X 5 MM MISC Use as instructed to inject insulin.  Diagnosis:  250.00  Insulin dependent.  Marland Kitchen lisinopril (PRINIVIL,ZESTRIL) 40 MG tablet Take 1 tablet (40 mg total) by mouth daily.  . meclizine (ANTIVERT) 25 MG tablet Take 25 mg by mouth every 6 (six) hours as needed. For dizziness/vertigo  . metFORMIN (GLUCOPHAGE) 500 MG tablet TAKE 2 TABLETS BY MOUTH TWICE A DAY WITH A MEAL  . metoprolol tartrate (LOPRESSOR) 25 MG tablet TAKE 1/2 TABLET  BY MOUTH TWICE DAILY  . Multiple Vitamins-Minerals (PRESERVISION AREDS 2 PO) Take 1 tablet by mouth 2 (two) times daily.  . nitroGLYCERIN (NITROSTAT) 0.4 MG SL tablet Place 1 tablet (0.4 mg total) under the tongue every 5 (five) minutes as needed. For chest pain  . traMADol (ULTRAM) 50 MG tablet Take 1 tablet (50 mg total) by mouth every 6 (six) hours as needed.    Allergies:   Augmentin [amoxicillin-pot clavulanate] and Morphine and related   Social History:  The patient  reports that he quit smoking about 19 years ago. His smoking use included cigarettes. He has a 40.00 pack-year smoking history. He has never used smokeless tobacco. He reports current alcohol use. He reports that he does not use drugs.   Family History:  The patient's family history includes CAD in his mother; Diabetes in his mother; Heart failure in his father.  ROS:   Review of Systems  Constitutional: Positive for malaise/fatigue. Negative for chills, diaphoresis, fever and weight loss.  HENT: Negative for congestion.   Eyes: Negative for discharge and redness.  Respiratory: Negative for cough, hemoptysis, sputum production, shortness of breath and wheezing.   Cardiovascular: Negative for  chest pain, palpitations, orthopnea, claudication, leg swelling and PND.  Gastrointestinal: Negative for abdominal pain, blood in stool, heartburn, melena, nausea and vomiting.  Genitourinary: Negative for hematuria.  Musculoskeletal: Negative for falls and myalgias.  Skin: Negative for rash.  Neurological: Negative for dizziness, tingling, tremors, sensory change, speech change, focal weakness, loss of consciousness and weakness.  Endo/Heme/Allergies: Does not bruise/bleed easily.  Psychiatric/Behavioral: Negative for substance abuse. The patient is not nervous/anxious.   All other systems reviewed and are negative.    PHYSICAL EXAM:  VS:  BP (!) 114/56 (BP Location: Left Arm, Patient Position: Sitting, Cuff Size: Normal)   Pulse 63   Ht 5' 8"  (1.727 m)   Wt 204 lb 8 oz (92.8 kg)   BMI 31.09 kg/m  BMI: Body mass index is 31.09 kg/m.  Physical Exam  Constitutional: He is oriented to person, place, and time. He appears well-developed and well-nourished.  HENT:  Head: Normocephalic and atraumatic.  Eyes: Right eye exhibits no discharge. Left eye exhibits no discharge.  Neck: Normal range of motion. No JVD present.  Cardiovascular: Normal rate, regular rhythm, S1 normal, S2 normal and normal heart sounds. Exam reveals no distant heart sounds, no friction rub, no midsystolic click and no opening snap.  No murmur heard. Pulses:      Carotid pulses are on the left side with bruit.      Posterior tibial pulses are 2+ on the right side and 2+ on the left side.  Pulmonary/Chest: Effort normal and breath sounds normal. No respiratory distress. He has no decreased breath sounds. He has no wheezes. He has no rales. He exhibits no tenderness.  Abdominal: Soft. He exhibits no distension. There is no abdominal tenderness.  Musculoskeletal:        General: No edema.  Neurological: He is alert and oriented to person, place, and time.  Skin: Skin is warm and dry. No cyanosis. Nails show no  clubbing.  Psychiatric: He has a normal mood and affect. His speech is normal and behavior is normal. Judgment and thought content normal.     EKG:  Was ordered and interpreted by me today. Shows NSR, 63 bpm, inferior Q waves, no acute ST-T changes (unchanged from prior)  Recent Labs: 12/25/2017: ALT 23 01/06/2018: Hemoglobin 13.9; Platelets 242 08/08/2018: BUN 20; Creatinine,  Ser 1.42; Potassium 3.7; Sodium 139  12/25/2017: Cholesterol 127; Direct LDL 58.0; HDL 22.70; Total CHOL/HDL Ratio 6; Triglycerides 360.0; VLDL 72.0   Estimated Creatinine Clearance: 51.2 mL/min (by C-G formula based on SCr of 1.42 mg/dL).   Wt Readings from Last 3 Encounters:  08/28/18 204 lb 8 oz (92.8 kg)  08/02/18 204 lb 8 oz (92.8 kg)  07/30/18 202 lb (91.6 kg)     Other studies reviewed: Additional studies/records reviewed today include: summarized above  ASSESSMENT AND PLAN:  1. Preoperative cardiac evaluation: Patient is being followed by vascular surgery for severe left sided carotid artery stenosis with plans for surgical intervention.  He is doing well from a cardiac perspective without any symptoms concerning for angina or volume overload.  He is able to achieve at least 4 METS without cardiac limitations.  Per revised cardiac index, he is at least moderate risk for noncardiac surgery with a 6.6% of adverse event.  No further cardiac testing is warranted at this time given his asymptomatic nature and will not further reduce his cardiac risk.  Patient and daughter have questions regarding what type of procedure will be performed, I have advised them that this will need to be discussed further with vascular surgery.  2. CAD involving the native coronary arteries without angina: He is doing well without any symptoms concerning for angina.  Continue dual antiplatelet therapy with aspirin 81 mg daily and Brilinta 60 mg twice daily.  Continue Lipitor, lisinopril, and Lopressor.  Aggressive risk factor modification  and secondary prevention.  Recent nuclear stress test showed stable anatomy when compared to prior cardiac cath in 2016, and was low risk.  No plans for further ischemic evaluation at this time.  3. PAD: No lifestyle limiting symptoms of claudication.  Followed by vascular surgery.  Aggressive retractor modification.  4. Carotid artery disease: Planning for vascular surgery intervention of RICA severe stenosis as outlined above.  Continue aggressive risk factor modification, aspirin, and atorvastatin.  5. AAA: Recent abdominal ultrasound showed maximal aortic diameter less than 4 cm.  Followed by vascular surgery.  6. Hypertension: Blood pressure is well controlled today as outlined above.  Continue current medications.  7. Hyperlipidemia: LDL of 58 from 12/2017 with normal liver function at that time.  Disposition: F/u with Dr. Rockey Situ or an APP in 6 months, sooner if needed.  Current medicines are reviewed at length with the patient today.  The patient did not have any concerns regarding medicines.  Signed, Christell Faith, PA-C 08/28/2018 9:04 AM     [address] [employer], [place] [address] [phone]

## 2018-08-28 ENCOUNTER — Encounter: Payer: Self-pay | Admitting: Physician Assistant

## 2018-08-28 ENCOUNTER — Ambulatory Visit (INDEPENDENT_AMBULATORY_CARE_PROVIDER_SITE_OTHER): Payer: Medicare Other | Admitting: Physician Assistant

## 2018-08-28 VITALS — BP 114/56 | HR 63 | Ht 68.0 in | Wt 204.5 lb

## 2018-08-28 DIAGNOSIS — I6522 Occlusion and stenosis of left carotid artery: Secondary | ICD-10-CM | POA: Diagnosis not present

## 2018-08-28 DIAGNOSIS — I2510 Atherosclerotic heart disease of native coronary artery without angina pectoris: Secondary | ICD-10-CM

## 2018-08-28 DIAGNOSIS — E785 Hyperlipidemia, unspecified: Secondary | ICD-10-CM | POA: Diagnosis not present

## 2018-08-28 DIAGNOSIS — I6523 Occlusion and stenosis of bilateral carotid arteries: Secondary | ICD-10-CM | POA: Diagnosis not present

## 2018-08-28 DIAGNOSIS — I714 Abdominal aortic aneurysm, without rupture, unspecified: Secondary | ICD-10-CM

## 2018-08-28 DIAGNOSIS — I739 Peripheral vascular disease, unspecified: Secondary | ICD-10-CM | POA: Diagnosis not present

## 2018-08-28 DIAGNOSIS — I10 Essential (primary) hypertension: Secondary | ICD-10-CM

## 2018-08-28 DIAGNOSIS — Z01810 Encounter for preprocedural cardiovascular examination: Secondary | ICD-10-CM | POA: Diagnosis not present

## 2018-08-28 NOTE — Patient Instructions (Signed)
Medication Instructions:  Your physician recommends that you continue on your current medications as directed. Please refer to the Current Medication list given to you today.  If you need a refill on your cardiac medications before your next appointment, please call your pharmacy.   Lab work: None ordered  If you have labs (blood work) drawn today and your tests are completely normal, you will receive your results only by: . MyChart Message (if you have MyChart) OR . A paper copy in the mail If you have any lab test that is abnormal or we need to change your treatment, we will call you to review the results.  Testing/Procedures: None ordered   Follow-Up: At [facility], you and your health needs are our priority.  As part of our continuing mission to provide you with exceptional heart care, we have created designated Provider Care Teams.  These Care Teams include your primary Cardiologist (physician) and Advanced Practice Providers (APPs -  Physician Assistants and Nurse Practitioners) who all work together to provide you with the care you need, when you need it. You will need a follow up appointment in 6 months.  Please call our office 2 months in advance to schedule this appointment.  You may see Dr. Gollan or Ryan Dunn, PA-C.  

## 2018-09-03 ENCOUNTER — Other Ambulatory Visit: Payer: Self-pay | Admitting: Cardiovascular Disease

## 2018-09-06 ENCOUNTER — Telehealth (INDEPENDENT_AMBULATORY_CARE_PROVIDER_SITE_OTHER): Payer: Self-pay

## 2018-09-06 ENCOUNTER — Encounter (INDEPENDENT_AMBULATORY_CARE_PROVIDER_SITE_OTHER): Payer: Self-pay

## 2018-09-06 NOTE — Telephone Encounter (Signed)
Spoke with the patient and gave him all of his information regarding his LCEA with Dr. Wyn Quaker on 09/21/2018 and his pre-op on 09/14/2018 with a 8:45 am arrival time. This information will also be mailed out to the patient.

## 2018-09-12 ENCOUNTER — Other Ambulatory Visit (INDEPENDENT_AMBULATORY_CARE_PROVIDER_SITE_OTHER): Payer: Self-pay | Admitting: Nurse Practitioner

## 2018-09-12 DIAGNOSIS — H43813 Vitreous degeneration, bilateral: Secondary | ICD-10-CM | POA: Diagnosis not present

## 2018-09-12 DIAGNOSIS — H353123 Nonexudative age-related macular degeneration, left eye, advanced atrophic without subfoveal involvement: Secondary | ICD-10-CM | POA: Diagnosis not present

## 2018-09-12 DIAGNOSIS — H353211 Exudative age-related macular degeneration, right eye, with active choroidal neovascularization: Secondary | ICD-10-CM | POA: Diagnosis not present

## 2018-09-12 DIAGNOSIS — H43391 Other vitreous opacities, right eye: Secondary | ICD-10-CM | POA: Diagnosis not present

## 2018-09-14 ENCOUNTER — Other Ambulatory Visit: Payer: Self-pay

## 2018-09-14 ENCOUNTER — Encounter
Admission: RE | Admit: 2018-09-14 | Discharge: 2018-09-14 | Disposition: A | Discharge status: Home / Self Care | Payer: Medicare Other | Source: Ambulatory Visit | Attending: Vascular Surgery | Admitting: Vascular Surgery

## 2018-09-14 DIAGNOSIS — Z01812 Encounter for preprocedural laboratory examination: Secondary | ICD-10-CM | POA: Insufficient documentation

## 2018-09-14 HISTORY — DX: Unspecified macular degeneration: H35.30

## 2018-09-14 LAB — CBC WITH DIFFERENTIAL/PLATELET
Abs Immature Granulocytes: 0.05 10*3/uL (ref 0.00–0.07)
Basophils Absolute: 0.1 10*3/uL (ref 0.0–0.1)
Basophils Relative: 1 %
Eosinophils Absolute: 0.3 10*3/uL (ref 0.0–0.5)
Eosinophils Relative: 4 %
HCT: 45.8 % (ref 39.0–52.0)
Hemoglobin: 15 g/dL (ref 13.0–17.0)
Immature Granulocytes: 1 %
Lymphocytes Relative: 21 %
Lymphs Abs: 1.5 10*3/uL (ref 0.7–4.0)
MCH: 30.6 pg (ref 26.0–34.0)
MCHC: 32.8 g/dL (ref 30.0–36.0)
MCV: 93.5 fL (ref 80.0–100.0)
Monocytes Absolute: 0.7 10*3/uL (ref 0.1–1.0)
Monocytes Relative: 9 %
NRBC: 0 % (ref 0.0–0.2)
Neutro Abs: 4.9 10*3/uL (ref 1.7–7.7)
Neutrophils Relative %: 64 %
Platelets: 214 10*3/uL (ref 150–400)
RBC: 4.9 MIL/uL (ref 4.22–5.81)
RDW: 14.7 % (ref 11.5–15.5)
WBC: 7.5 10*3/uL (ref 4.0–10.5)

## 2018-09-14 LAB — TYPE AND SCREEN
ABO/RH(D): A POS
Antibody Screen: NEGATIVE

## 2018-09-14 LAB — BASIC METABOLIC PANEL
Anion gap: 11 (ref 5–15)
BUN: 23 mg/dL (ref 8–23)
CALCIUM: 9.4 mg/dL (ref 8.9–10.3)
CO2: 25 mmol/L (ref 22–32)
Chloride: 105 mmol/L (ref 98–111)
Creatinine, Ser: 1.32 mg/dL — ABNORMAL HIGH (ref 0.61–1.24)
GFR calc non Af Amer: 53 mL/min — ABNORMAL LOW (ref >60)
Glucose, Bld: 165 mg/dL — ABNORMAL HIGH (ref 70–99)
Potassium: 4.1 mmol/L (ref 3.5–5.1)
SODIUM: 141 mmol/L (ref 135–145)

## 2018-09-14 LAB — PROTIME-INR
INR: 1 (ref 0.8–1.2)
Prothrombin Time: 13 seconds (ref 11.4–15.2)

## 2018-09-14 LAB — APTT: aPTT: 29 seconds (ref 24–36)

## 2018-09-14 NOTE — Patient Instructions (Signed)
INSTRUCTIONS FOR SURGERY     Your surgery is scheduled for: Friday, September 21, 2018       To find out your arrival time for the day of surgery,          please call [phone] between 1 pm and 3 pm on : Thursday, September 20, 2018     When you arrive for surgery, report to the SECOND FLOOR OF THE MEDICAL MALL.       Do NOT stop on the first floor to register.    REMEMBER: Instructions that are not followed completely may result in serious medical risk,  up to and including death, or upon the discretion of your surgeon and anesthesiologist,            your surgery may need to be rescheduled.  __X__ 1. Do not eat food after midnight the night before your procedure.                    No gum, candy, lozenger, tic tacs, tums or hard candies.                  ABSOLUTELY NOTHING SOLID IN YOUR MOUTH AFTER MIDNIGHT                    You may drink unlimited clear liquids up to 2 hours before you are scheduled                       to arrive for surgery. Do not drink anything within those 2 hours unless                      You need to take medicine, then take the smallest amount you need.                  Clear liquids include:  water, apple juice without pulp, any flavor Gatorade,                        Black coffee, black tea.  Sugar may be added but no dairy/ honey /lemon.                        Broth and jello is not considered a clear liquid.  __x__  2. On the morning of surgery, please brush your teeth with toothpaste and water.                    You may rinse with mouthwash if you wish but DO NOT SWALLOW TOOTHPASTE OR MOUTHWASH  __X___3. NO alcohol for 24 hours before or after surgery.  __x___ 4.  Do NOT smoke or use e-cigarettes for 24 HOURS PRIOR TO SURGERY.                      DO NOT Use any chewable tobacco products for at least 6 hours prior to surgery.  __x___ 5. If you start any new medication after this appointment and  prior to surgery, please  Bring it with you on the day of surgery.  ___x__ 6. Notify your doctor if there is any change in your medical condition, such as fever, infection, vomitting, diarrhea.  __x___ 7.  USE the CHG SOAP as instructed, the night before surgery and the day of surgery.                    Once you have washed with this soap, do NOT use any of the following:                      Powders, perfumes, lotions, make up, hairpins, clips or nail polish.                     You _MAY_ wear deodorant.                      Men may shave their face and neck.  Women need to shave 48 hours prior to surgery.                     DO NOT wear ANY jewelry on the day of surgery. If there are rings that are too tight to remove easily,                         please address this prior to the surgery day. Piercings need to be removed.   NO METAL ON YOUR BODY.                     Do NOT bring any valuables.  If you came to Pre-Admit testing then you will not need license, insurance card or credit card.                       If you will be staying overnight, please either leave your things in the car or have your family be responsible for this.                      [facility] IS NOT RESPONSIBLE FOR BELONGINGS OR VALUABLES.  ___X__ 8. DO NOT wear contact lenses on surgery day.  You may not have dentures,                     Hearing aides, contacts or glasses in the operating room. These items can be                    Placed in the Recovery Room to receive immediately after surgery.  __x___ 9. IF YOU ARE SCHEDULED TO GO HOME ON THE SAME DAY, YOU MUST                   Have someone to drive you home and to stay with you  for the first 24 hours.                    Have an arrangement prior to arriving on surgery day.  _____ 10. Take the following medications on the morning of surgery with a sip of water:                              1.ASPIRIN  2.ADVAIR INHALER                      3. METOPROLOL                     4.                     5.                     6.  _____ 11.  Follow any instructions provided to you by your surgeon.                        Such as enema, clear liquid bowel prep  __X__  12. STOP BRILINTA AS OF September 15, 2018.  CONTINUE TAKING ASPIRIN                       STOP BC POWDERS / GOODIES POWDER  __x___ 13. STOP Anti-inflammatories as of:                       This includes IBUPROFEN / MOTRIN / ADVIL / ALEVE/ NAPROXYN                    YOU MAY TAKE TYLENOL ANY TIME PRIOR TO SURGERY.  _____ 14.  Stop supplements until after surgery.                     This includes: GLUCOSAMINE CHONDROITIN.  Stop as of today                  You may continue taking Vitamin B12 / Vitamin D3 but do not take on the                   Morning of surgery.  _____ 15. Bring your CPAP machine into preop with you on the morning of surgery.  __X__16.  Stop Metformin 2 full days prior to surgery.  Stop on: MARCH 10TH DINNER  ______17.  Continue to take the following medications but do not take on the morning                      Of surgery: GLIPIZIDE / HYDROCHLOROTHIAZIDE / PRESERVISION   CONTINUE TAKING NIGHT TIME MEDICINE AS USUAL.  THIS INCLUDES TRICOR AND   ATORVASTATIN.  IF LISINOPRIL IS AT NIGHT, CONTINUE TO TAKE.  If it is in the morning,    Do not take it on surgery day.   ______18. If staying overnight, please have appropriate shoes to wear to be able to                      Walk around the unit.

## 2018-09-15 NOTE — Pre-Procedure Instructions (Signed)
Requested H&P. 

## 2018-09-17 LAB — SURGICAL PCR SCREEN
MRSA, PCR: POSITIVE — AB
Staphylococcus aureus: POSITIVE — AB

## 2018-09-20 MED ORDER — CLINDAMYCIN PHOSPHATE 300 MG/50ML IV SOLN
300.0000 mg | INTRAVENOUS | Status: AC
  Administered 2018-09-21: 300 mg via INTRAVENOUS

## 2018-09-21 ENCOUNTER — Inpatient Hospital Stay: Payer: Medicare Other | Admitting: Certified Registered"

## 2018-09-21 ENCOUNTER — Other Ambulatory Visit: Payer: Self-pay

## 2018-09-21 ENCOUNTER — Inpatient Hospital Stay
Admission: RE | Admit: 2018-09-21 | Discharge: 2018-09-22 | DRG: 039 | Disposition: A | Discharge status: Home / Self Care | Payer: Medicare Other | Attending: Vascular Surgery | Admitting: Vascular Surgery

## 2018-09-21 ENCOUNTER — Encounter: Payer: Self-pay | Admitting: *Deleted

## 2018-09-21 ENCOUNTER — Encounter
Admission: RE | Disposition: A | Discharge status: Home / Self Care | Payer: Self-pay | Source: Home / Self Care | Attending: Vascular Surgery

## 2018-09-21 DIAGNOSIS — Z87891 Personal history of nicotine dependence: Secondary | ICD-10-CM

## 2018-09-21 DIAGNOSIS — J449 Chronic obstructive pulmonary disease, unspecified: Secondary | ICD-10-CM | POA: Diagnosis present

## 2018-09-21 DIAGNOSIS — E7800 Pure hypercholesterolemia, unspecified: Secondary | ICD-10-CM | POA: Diagnosis present

## 2018-09-21 DIAGNOSIS — Z8249 Family history of ischemic heart disease and other diseases of the circulatory system: Secondary | ICD-10-CM

## 2018-09-21 DIAGNOSIS — I6522 Occlusion and stenosis of left carotid artery: Secondary | ICD-10-CM | POA: Diagnosis present

## 2018-09-21 DIAGNOSIS — J441 Chronic obstructive pulmonary disease with (acute) exacerbation: Secondary | ICD-10-CM | POA: Diagnosis not present

## 2018-09-21 DIAGNOSIS — E785 Hyperlipidemia, unspecified: Secondary | ICD-10-CM | POA: Diagnosis present

## 2018-09-21 DIAGNOSIS — N189 Chronic kidney disease, unspecified: Secondary | ICD-10-CM | POA: Diagnosis present

## 2018-09-21 DIAGNOSIS — I714 Abdominal aortic aneurysm, without rupture: Secondary | ICD-10-CM | POA: Diagnosis present

## 2018-09-21 DIAGNOSIS — Z955 Presence of coronary angioplasty implant and graft: Secondary | ICD-10-CM

## 2018-09-21 DIAGNOSIS — Z7982 Long term (current) use of aspirin: Secondary | ICD-10-CM | POA: Diagnosis not present

## 2018-09-21 DIAGNOSIS — K219 Gastro-esophageal reflux disease without esophagitis: Secondary | ICD-10-CM | POA: Diagnosis present

## 2018-09-21 DIAGNOSIS — I2510 Atherosclerotic heart disease of native coronary artery without angina pectoris: Secondary | ICD-10-CM | POA: Diagnosis present

## 2018-09-21 DIAGNOSIS — I724 Aneurysm of artery of lower extremity: Secondary | ICD-10-CM | POA: Diagnosis present

## 2018-09-21 DIAGNOSIS — E1122 Type 2 diabetes mellitus with diabetic chronic kidney disease: Secondary | ICD-10-CM | POA: Diagnosis present

## 2018-09-21 DIAGNOSIS — I129 Hypertensive chronic kidney disease with stage 1 through stage 4 chronic kidney disease, or unspecified chronic kidney disease: Secondary | ICD-10-CM | POA: Diagnosis present

## 2018-09-21 DIAGNOSIS — Z833 Family history of diabetes mellitus: Secondary | ICD-10-CM | POA: Diagnosis not present

## 2018-09-21 DIAGNOSIS — I252 Old myocardial infarction: Secondary | ICD-10-CM | POA: Diagnosis not present

## 2018-09-21 DIAGNOSIS — E1151 Type 2 diabetes mellitus with diabetic peripheral angiopathy without gangrene: Secondary | ICD-10-CM | POA: Diagnosis present

## 2018-09-21 HISTORY — PX: ENDARTERECTOMY: SHX5162

## 2018-09-21 LAB — GLUCOSE, CAPILLARY
GLUCOSE-CAPILLARY: 260 mg/dL — AB (ref 70–99)
Glucose-Capillary: 289 mg/dL — ABNORMAL HIGH (ref 70–99)

## 2018-09-21 LAB — ABO/RH: ABO/RH(D): A POS

## 2018-09-21 SURGERY — ENDARTERECTOMY, CAROTID
Anesthesia: General | Laterality: Left

## 2018-09-21 MED ORDER — KCL IN DEXTROSE-NACL 20-5-0.9 MEQ/L-%-% IV SOLN
INTRAVENOUS | Status: DC
  Administered 2018-09-21 – 2018-09-22 (×2): via INTRAVENOUS
  Filled 2018-09-21 (×3): qty 1000

## 2018-09-21 MED ORDER — FENTANYL CITRATE (PF) 100 MCG/2ML IJ SOLN
INTRAMUSCULAR | Status: DC | PRN
  Administered 2018-09-21: 100 ug via INTRAVENOUS

## 2018-09-21 MED ORDER — FAMOTIDINE 20 MG PO TABS
ORAL_TABLET | ORAL | Status: AC
  Filled 2018-09-21: qty 1

## 2018-09-21 MED ORDER — GUAIFENESIN-DM 100-10 MG/5ML PO SYRP: 15.0000 mL | ORAL_SOLUTION | ORAL | Status: DC | PRN

## 2018-09-21 MED ORDER — ACETAMINOPHEN 325 MG PO TABS
325.0000 mg | ORAL_TABLET | ORAL | Status: DC | PRN
  Administered 2018-09-21: 650 mg via ORAL
  Filled 2018-09-21 (×2): qty 2

## 2018-09-21 MED ORDER — CLINDAMYCIN PHOSPHATE 300 MG/50ML IV SOLN
INTRAVENOUS | Status: AC
  Filled 2018-09-21: qty 50

## 2018-09-21 MED ORDER — METOPROLOL TARTRATE 5 MG/5ML IV SOLN: 2.0000 mg | INTRAVENOUS | Status: DC | PRN

## 2018-09-21 MED ORDER — LIDOCAINE HCL (PF) 2 % IJ SOLN
INTRAMUSCULAR | Status: AC
  Filled 2018-09-21: qty 10

## 2018-09-21 MED ORDER — SODIUM CHLORIDE 0.9 % IV SOLN
INTRAVENOUS | Status: DC | PRN
  Administered 2018-09-21: 15 ug/min via INTRAVENOUS

## 2018-09-21 MED ORDER — ONDANSETRON HCL 4 MG/2ML IJ SOLN
INTRAMUSCULAR | Status: DC | PRN
  Administered 2018-09-21: 4 mg via INTRAVENOUS

## 2018-09-21 MED ORDER — LIDOCAINE HCL (PF) 1 % IJ SOLN
INTRAMUSCULAR | Status: AC
  Filled 2018-09-21: qty 30

## 2018-09-21 MED ORDER — PANTOPRAZOLE SODIUM 40 MG IV SOLR
40.0000 mg | Freq: Every day | INTRAVENOUS | Status: DC
  Administered 2018-09-21: 40 mg via INTRAVENOUS
  Filled 2018-09-21: qty 40

## 2018-09-21 MED ORDER — ESMOLOL HCL 100 MG/10ML IV SOLN
INTRAVENOUS | Status: DC | PRN
  Administered 2018-09-21: 20 mg via INTRAVENOUS
  Administered 2018-09-21: 30 mg via INTRAVENOUS

## 2018-09-21 MED ORDER — MIDAZOLAM HCL 2 MG/2ML IJ SOLN
INTRAMUSCULAR | Status: AC
  Filled 2018-09-21: qty 2

## 2018-09-21 MED ORDER — ONDANSETRON HCL 4 MG/2ML IJ SOLN: 4.0000 mg | Freq: Four times a day (QID) | INTRAMUSCULAR | Status: DC | PRN

## 2018-09-21 MED ORDER — FENTANYL CITRATE (PF) 100 MCG/2ML IJ SOLN
INTRAMUSCULAR | Status: AC
  Filled 2018-09-21: qty 4

## 2018-09-21 MED ORDER — SODIUM CHLORIDE 0.9 % IV SOLN
INTRAVENOUS | Status: DC
  Administered 2018-09-21: 07:00:00 via INTRAVENOUS

## 2018-09-21 MED ORDER — PROPOFOL 10 MG/ML IV BOLUS
INTRAVENOUS | Status: AC
  Filled 2018-09-21: qty 60

## 2018-09-21 MED ORDER — CHLORHEXIDINE GLUCONATE CLOTH 2 % EX PADS: 6.0000 | MEDICATED_PAD | Freq: Once | CUTANEOUS | Status: DC

## 2018-09-21 MED ORDER — HEPARIN SODIUM (PORCINE) 1000 UNIT/ML IJ SOLN
INTRAMUSCULAR | Status: DC | PRN
  Administered 2018-09-21: 7000 [IU] via INTRAVENOUS

## 2018-09-21 MED ORDER — FAMOTIDINE 20 MG PO TABS
20.0000 mg | ORAL_TABLET | Freq: Once | ORAL | Status: AC
  Administered 2018-09-21: 20 mg via ORAL

## 2018-09-21 MED ORDER — EPHEDRINE SULFATE 50 MG/ML IJ SOLN
INTRAMUSCULAR | Status: DC | PRN
  Administered 2018-09-21 (×4): 5 mg via INTRAVENOUS

## 2018-09-21 MED ORDER — SUGAMMADEX SODIUM 500 MG/5ML IV SOLN
INTRAVENOUS | Status: DC | PRN
  Administered 2018-09-21: 370 mg via INTRAVENOUS

## 2018-09-21 MED ORDER — ONDANSETRON HCL 4 MG/2ML IJ SOLN
4.0000 mg | Freq: Four times a day (QID) | INTRAMUSCULAR | Status: DC | PRN
  Administered 2018-09-21 (×2): 4 mg via INTRAVENOUS
  Filled 2018-09-21 (×2): qty 2

## 2018-09-21 MED ORDER — HYDROMORPHONE HCL 1 MG/ML IJ SOLN
0.5000 mg | INTRAMUSCULAR | Status: DC | PRN
  Administered 2018-09-21: 0.5 mg via INTRAVENOUS
  Administered 2018-09-21: 1 mg via INTRAVENOUS
  Filled 2018-09-21 (×2): qty 1

## 2018-09-21 MED ORDER — ALUM & MAG HYDROXIDE-SIMETH 200-200-20 MG/5ML PO SUSP: 15.0000 mL | ORAL | Status: DC | PRN

## 2018-09-21 MED ORDER — FENTANYL CITRATE (PF) 100 MCG/2ML IJ SOLN
25.0000 ug | INTRAMUSCULAR | Status: DC | PRN
  Administered 2018-09-21: 25 ug via INTRAVENOUS

## 2018-09-21 MED ORDER — FENTANYL CITRATE (PF) 100 MCG/2ML IJ SOLN
INTRAMUSCULAR | Status: AC
  Administered 2018-09-21: 25 ug via INTRAVENOUS
  Filled 2018-09-21: qty 2

## 2018-09-21 MED ORDER — DOCUSATE SODIUM 100 MG PO CAPS
100.0000 mg | ORAL_CAPSULE | Freq: Every day | ORAL | Status: DC
  Administered 2018-09-22: 100 mg via ORAL
  Filled 2018-09-21: qty 1

## 2018-09-21 MED ORDER — ACETAMINOPHEN 650 MG RE SUPP: 325.0000 mg | RECTAL | Status: DC | PRN

## 2018-09-21 MED ORDER — LIDOCAINE HCL (CARDIAC) PF 100 MG/5ML IV SOSY
PREFILLED_SYRINGE | INTRAVENOUS | Status: DC | PRN
  Administered 2018-09-21: 100 mg via INTRAVENOUS

## 2018-09-21 MED ORDER — ROCURONIUM BROMIDE 100 MG/10ML IV SOLN
INTRAVENOUS | Status: DC | PRN
  Administered 2018-09-21: 50 mg via INTRAVENOUS
  Administered 2018-09-21 (×3): 10 mg via INTRAVENOUS

## 2018-09-21 MED ORDER — PHENOL 1.4 % MT LIQD
1.0000 | OROMUCOSAL | Status: DC | PRN
  Filled 2018-09-21: qty 177

## 2018-09-21 MED ORDER — GLYCOPYRROLATE 0.2 MG/ML IJ SOLN
INTRAMUSCULAR | Status: DC | PRN
  Administered 2018-09-21: 0.2 mg via INTRAVENOUS

## 2018-09-21 MED ORDER — DEXAMETHASONE SODIUM PHOSPHATE 10 MG/ML IJ SOLN
INTRAMUSCULAR | Status: DC | PRN
  Administered 2018-09-21: 5 mg via INTRAVENOUS

## 2018-09-21 MED ORDER — HEPARIN SODIUM (PORCINE) 5000 UNIT/ML IJ SOLN
INTRAMUSCULAR | Status: AC
  Filled 2018-09-21: qty 1

## 2018-09-21 MED ORDER — SODIUM CHLORIDE 0.9 % IV SOLN
500.0000 mL | Freq: Once | INTRAVENOUS | Status: AC | PRN
  Administered 2018-09-21: 500 mL via INTRAVENOUS

## 2018-09-21 MED ORDER — CLINDAMYCIN PHOSPHATE 300 MG/50ML IV SOLN
300.0000 mg | Freq: Three times a day (TID) | INTRAVENOUS | Status: AC
  Administered 2018-09-21 (×2): 300 mg via INTRAVENOUS
  Filled 2018-09-21 (×2): qty 50

## 2018-09-21 MED ORDER — PHENYLEPHRINE HCL 10 MG/ML IJ SOLN
INTRAMUSCULAR | Status: DC | PRN
  Administered 2018-09-21: 100 ug via INTRAVENOUS
  Administered 2018-09-21: 200 ug via INTRAVENOUS

## 2018-09-21 MED ORDER — ONDANSETRON HCL 4 MG/2ML IJ SOLN: 4.0000 mg | Freq: Once | INTRAMUSCULAR | Status: DC | PRN

## 2018-09-21 MED ORDER — EVICEL 2 ML EX KIT
PACK | CUTANEOUS | Status: AC
  Filled 2018-09-21: qty 1

## 2018-09-21 MED ORDER — LABETALOL HCL 5 MG/ML IV SOLN: 10.0000 mg | INTRAVENOUS | Status: DC | PRN

## 2018-09-21 MED ORDER — SUCCINYLCHOLINE CHLORIDE 20 MG/ML IJ SOLN
INTRAMUSCULAR | Status: DC | PRN
  Administered 2018-09-21: 100 mg via INTRAVENOUS

## 2018-09-21 MED ORDER — NITROGLYCERIN IN D5W 200-5 MCG/ML-% IV SOLN
INTRAVENOUS | Status: AC
  Filled 2018-09-21: qty 250

## 2018-09-21 MED ORDER — HYDROMORPHONE HCL 1 MG/ML IJ SOLN: 1.0000 mg | Freq: Once | INTRAMUSCULAR | Status: DC | PRN

## 2018-09-21 MED ORDER — HYDRALAZINE HCL 20 MG/ML IJ SOLN: 5.0000 mg | INTRAMUSCULAR | Status: DC | PRN

## 2018-09-21 MED ORDER — PROPOFOL 10 MG/ML IV BOLUS
INTRAVENOUS | Status: DC | PRN
  Administered 2018-09-21: 200 mg via INTRAVENOUS

## 2018-09-21 SURGICAL SUPPLY — 71 items
ADH SKN CLS APL DERMABOND .7 (GAUZE/BANDAGES/DRESSINGS) ×1
APPLIER CLIP 11 MED OPEN (CLIP)
APPLIER CLIP 9.375 SM OPEN (CLIP)
APR CLP MED 11 20 MLT OPN (CLIP)
APR CLP SM 9.3 20 MLT OPN (CLIP)
BAG DECANTER FOR FLEXI CONT (MISCELLANEOUS) ×3 IMPLANT
BLADE SURG 15 STRL LF DISP TIS (BLADE) ×1 IMPLANT
BLADE SURG 15 STRL SS (BLADE) ×3
BLADE SURG SZ11 CARB STEEL (BLADE) ×3 IMPLANT
BOOT SUTURE AID YELLOW STND (SUTURE) ×6 IMPLANT
BRUSH SCRUB EZ  4% CHG (MISCELLANEOUS) ×2
BRUSH SCRUB EZ 4% CHG (MISCELLANEOUS) ×1 IMPLANT
CANISTER SUCT 1200ML W/VALVE (MISCELLANEOUS) ×3 IMPLANT
CLIP APPLIE 11 MED OPEN (CLIP) IMPLANT
CLIP APPLIE 9.375 SM OPEN (CLIP) IMPLANT
COVER WAND RF STERILE (DRAPES) ×1 IMPLANT
DERMABOND ADVANCED (GAUZE/BANDAGES/DRESSINGS) ×2
DERMABOND ADVANCED .7 DNX12 (GAUZE/BANDAGES/DRESSINGS) ×1 IMPLANT
DRAPE INCISE IOBAN 66X45 STRL (DRAPES) ×3 IMPLANT
DRAPE LAPAROTOMY 100X77 ABD (DRAPES) ×3 IMPLANT
DRAPE SHEET LG 3/4 BI-LAMINATE (DRAPES) ×3 IMPLANT
DRESSING SURGICEL FIBRLLR 1X2 (HEMOSTASIS) ×1 IMPLANT
DRSG SURGICEL FIBRILLAR 1X2 (HEMOSTASIS) ×3
DURAPREP 26ML APPLICATOR (WOUND CARE) ×3 IMPLANT
ELECT CAUTERY BLADE 6.4 (BLADE) ×3 IMPLANT
ELECT REM PT RETURN 9FT ADLT (ELECTROSURGICAL) ×3
ELECTRODE REM PT RTRN 9FT ADLT (ELECTROSURGICAL) ×1 IMPLANT
GLOVE BIO SURGEON STRL SZ7 (GLOVE) ×3 IMPLANT
GLOVE INDICATOR 7.5 STRL GRN (GLOVE) ×3 IMPLANT
GLOVE SURG SYN 8.0 (GLOVE) ×3 IMPLANT
GLOVE SURG SYN 8.0 PF PI (GLOVE) ×1 IMPLANT
GOWN STRL REUS W/ TWL LRG LVL3 (GOWN DISPOSABLE) ×2 IMPLANT
GOWN STRL REUS W/ TWL XL LVL3 (GOWN DISPOSABLE) ×1 IMPLANT
GOWN STRL REUS W/TWL LRG LVL3 (GOWN DISPOSABLE) ×6
GOWN STRL REUS W/TWL XL LVL3 (GOWN DISPOSABLE) ×3
HOLDER FOLEY CATH W/STRAP (MISCELLANEOUS) ×3 IMPLANT
IV NS 500ML (IV SOLUTION) ×3
IV NS 500ML BAXH (IV SOLUTION) ×1 IMPLANT
KIT TURNOVER KIT A (KITS) ×3 IMPLANT
LABEL OR SOLS (LABEL) ×3 IMPLANT
LOOP RED MAXI  1X406MM (MISCELLANEOUS) ×4
LOOP VESSEL MAXI 1X406 RED (MISCELLANEOUS) ×2 IMPLANT
LOOP VESSEL MINI 0.8X406 BLUE (MISCELLANEOUS) ×1 IMPLANT
LOOPS BLUE MINI 0.8X406MM (MISCELLANEOUS) ×2
NDL FILTER BLUNT 18X1 1/2 (NEEDLE) ×1 IMPLANT
NDL HYPO 25X1 1.5 SAFETY (NEEDLE) ×1 IMPLANT
NEEDLE FILTER BLUNT 18X 1/2SAF (NEEDLE) ×2
NEEDLE FILTER BLUNT 18X1 1/2 (NEEDLE) ×1 IMPLANT
NEEDLE HYPO 25X1 1.5 SAFETY (NEEDLE) ×3 IMPLANT
NS IRRIG 500ML POUR BTL (IV SOLUTION) ×3 IMPLANT
PACK BASIN MAJOR ARMC (MISCELLANEOUS) ×3 IMPLANT
PENCIL ELECTRO HAND CTR (MISCELLANEOUS) IMPLANT
SHUNT CAROTID STR REINF 3.0X4. (MISCELLANEOUS) ×3 IMPLANT
SUT MNCRL+ 5-0 UNDYED PC-3 (SUTURE) ×1 IMPLANT
SUT MONOCRYL 5-0 (SUTURE) ×2
SUT PROLENE 6 0 BV (SUTURE) ×24 IMPLANT
SUT PROLENE 7 0 BV 1 (SUTURE) ×14 IMPLANT
SUT SILK 2 0 (SUTURE) ×3
SUT SILK 2-0 18XBRD TIE 12 (SUTURE) ×1 IMPLANT
SUT SILK 3 0 (SUTURE) ×3
SUT SILK 3-0 18XBRD TIE 12 (SUTURE) ×1 IMPLANT
SUT SILK 4 0 (SUTURE) ×3
SUT SILK 4-0 18XBRD TIE 12 (SUTURE) ×1 IMPLANT
SUT VIC AB 3-0 SH 27 (SUTURE) ×3
SUT VIC AB 3-0 SH 27X BRD (SUTURE) ×1 IMPLANT
SYR 10ML LL (SYRINGE) ×3 IMPLANT
SYR 20CC LL (SYRINGE) ×3 IMPLANT
SYR 3ML LL SCALE MARK (SYRINGE) ×3 IMPLANT
TRAY FOLEY MTR SLVR 16FR STAT (SET/KITS/TRAYS/PACK) ×3 IMPLANT
TUBING CONNECTING 10 (TUBING) IMPLANT
TUBING CONNECTING 10' (TUBING)

## 2018-09-21 NOTE — H&P (Signed)
[name] Admission History & Physical  MRN : [id number]  [name] is a 73 y.o. (05-Apr-1946) male who presents with chief complaint of No chief complaint on file. Marland Kitchen  History of Present Illness:   The patient is seen for follow up evaluation of carotid stenosis. The carotid stenosis followed by ultrasound.   The patient denies amaurosis fugax. There is no recent history of TIA symptoms or focal motor deficits. There is no prior documented CVA.  The patient is taking enteric-coated aspirin 81 mg daily.  There is no history of migraine headaches. There is no history of seizures.  There have been no significant changes to the patient's overall health care.  The patient denies history of DVT, PE or superficial thrombophlebitis. The patient denies recent episodes of angina or shortness of breath.  Carotid Duplex done today shows<40% RICA and >80% LICA. Increased stenosis of the left compared to last study  Current Facility-Administered Medications  Medication Dose Route Frequency Provider Last Rate Last Dose  . 0.9 %  sodium chloride infusion   Intravenous Continuous Penwarden, Amy, MD 100 mL/hr at 09/21/18 0649    . 0.9 %  sodium chloride infusion   Intravenous Continuous Penwarden, Amy, MD 100 mL/hr at 09/21/18 0650    . Chlorhexidine Gluconate Cloth 2 % PADS 6 each  6 each Topical Once Georgiana Spinner, NP       And  . Chlorhexidine Gluconate Cloth 2 % PADS 6 each  6 each Topical Once Georgiana Spinner, NP      . clindamycin (CLEOCIN) 300 MG/50ML IVPB           . clindamycin (CLEOCIN) IVPB 300 mg  300 mg Intravenous On Call to OR Georgiana Spinner, NP      . famotidine (PEPCID) 20 MG tablet             Past Medical History:  Diagnosis Date  . CAD (coronary artery disease)    a. 1999: PCI-->LAD 2/2 MI; b. 2005: inf MI s/p PCI/DES x 4 to RCA; c. Myoview in 12/2005: EF 61%, no evidence for ischemia; d. cath 10/2014: occluded mLAD w/ L to L and L  to R collats, dRCA 95% s/p PCI/DES 0%, mPDA 70%, LCx mild to mod irregs, procedure complicated by inf ST ele in recovery, repeat cath showed acute dRCA stent thrombosis o/w occluded mRPDA, PTCA dRCA, PCI/DES RPDA, aggrastat x 18 hr  . CKD (chronic kidney disease)   . COPD (chronic obstructive pulmonary disease) (HCC)   . DM2 (diabetes mellitus, type 2) (HCC)   . H/O hiatal hernia   . Heart attack (HCC) [phone]   had 2 in one day(released plaque during angio). 5 stents total  . HTN (hypertension)   . Hypercholesterolemia   . Impaired fasting glucose    Elevated after steroid injection  . Macular degeneration of right eye 2020   receiving injections into eye  . Osteoarthritis of hip    injections in both hips  . Osteoarthritis, knee   . PAD (peripheral artery disease) (HCC)    L fem to below the knee popliteal vein bypass graft  . Peri-rectal abscess 12/27/2011  . Tobacco abuse    Prior    Past Surgical History:  Procedure Laterality Date  . CORONARY ANGIOPLASTY    . CORONARY STENT PLACEMENT  1999, 2006, 2016 x 2   Multiple, LAD in 1999, RCA in 2005.  has a total of 4 stents  .  FEMORAL-POPLITEAL BYPASS GRAFT  2016   L fem to below the knee popliteal vein bypass graft  . INCISE AND DRAIN ABCESS  2007   on scrotum  . INCISION AND DRAINAGE PERIRECTAL ABSCESS  12/28/2011   Procedure: IRRIGATION AND DEBRIDEMENT PERIRECTAL ABSCESS;  Surgeon: Robyne AskewPaul S Toth III, MD;  Location: [facility] OR;  Service: General;  Laterality: N/A;    Social History Social History   Tobacco Use  . Smoking status: Former Smoker    Packs/day: 1.00    Years: 40.00    Pack years: 40.00    Types: Cigarettes    Last attempt to quit: 10/21/1998    Years since quitting: 19.9  . Smokeless tobacco: Never Used  . Tobacco comment: Past smoker, states he will smoke maybe 1 cigarette/ month or so still  Substance Use Topics  . Alcohol use: Yes    Alcohol/week: 0.0 standard drinks    Comment: Occasional beer on the  weekends  . Drug use: No    Family History Family History  Problem Relation Age of Onset  . Heart failure Father        CHF  . CAD Mother   . Diabetes Mother   . Colon cancer Neg Hx   . Prostate cancer Neg Hx   No family history of bleeding/clotting disorders, porphyria or autoimmune disease   Allergies  Allergen Reactions  . Augmentin [Amoxicillin-Pot Clavulanate] Other (See Comments)    Nausea,vomiting,diarrhea  . Morphine And Related Nausea And Vomiting    Vomiting, GI upset     REVIEW OF SYSTEMS (Negative unless checked)  Constitutional: [] Weight loss  [] Fever  [] Chills Cardiac: [] Chest pain   [] Chest pressure   [] Palpitations   [] Shortness of breath when laying flat   [] Shortness of breath at rest   [] Shortness of breath with exertion. Vascular:  [x] Pain in legs with walking   [] Pain in legs at rest   [] Pain in legs when laying flat   [] Claudication   [] Pain in feet when walking  [] Pain in feet at rest  [] Pain in feet when laying flat   [] History of DVT   [] Phlebitis   [] Swelling in legs   [] Varicose veins   [] Non-healing ulcers Pulmonary:   [] Uses home oxygen   [] Productive cough   [] Hemoptysis   [] Wheeze  [] COPD   [] Asthma Neurologic:  [] Dizziness  [] Blackouts   [] Seizures   [] History of stroke   [] History of TIA  [] Aphasia   [] Temporary blindness   [] Dysphagia   [] Weakness or numbness in arms   [] Weakness or numbness in legs Musculoskeletal:  [] Arthritis   [] Joint swelling   [x] Joint pain   [x] Low back pain Hematologic:  [] Easy bruising  [] Easy bleeding   [] Hypercoagulable state   [] Anemic  [] Hepatitis Gastrointestinal:  [] Blood in stool   [] Vomiting blood  [] Gastroesophageal reflux/heartburn   [] Difficulty swallowing. Genitourinary:  [] Chronic kidney disease   [] Difficult urination  [] Frequent urination  [] Burning with urination   [] Blood in urine Skin:  [] Rashes   [] Ulcers   [] Wounds Psychological:  [] History of anxiety   []  History of major depression.  Physical  Examination  Vitals:   09/21/18 0617  BP: 116/78  Pulse: 64  Resp: 18  Temp: 98 F (36.7 C)  TempSrc: Oral  SpO2: 95%   There is no height or weight on file to calculate BMI. Gen: WD/WN, NAD Head: [place]/AT, No temporalis wasting.  Ear/Nose/Throat: Hearing grossly intact, nares w/o erythema or drainage, oropharynx w/o Erythema/Exudate, Eyes: Sclera non-icteric, conjunctiva  clear Neck: Supple, no nuchal rigidity.  No JVD.  Pulmonary:  Good air movement, no increased work of respiration or use of accessory muscles  Cardiac: RRR, normal S1, S2, no Murmurs, rubs or gallops. Vascular: bilateral carotid bruits Vessel Right Left  Radial Palpable Palpable  Ulnar Palpable Palpable  Brachial Palpable Palpable  Carotid Palpable, without bruit Palpable, without bruit  Gastrointestinal: soft, non-tender/non-distended. No guarding/reflex. No masses, surgical incisions, or scars. Musculoskeletal: M/S 5/5 throughout.  No deformity or atrophy.  trace edema Neurologic: Sensation grossly intact in extremities.  Symmetrical.  Speech is fluent. Motor exam as listed above. Psychiatric: Judgment intact, Mood & affect appropriate for pt's clinical situation. Dermatologic: No rashes or ulcers noted.  No cellulitis or open wounds. Lymph : No Cervical, Axillary, or Inguinal lymphadenopathy.   CBC Lab Results  Component Value Date   WBC 7.5 09/14/2018   HGB 15.0 09/14/2018   HCT 45.8 09/14/2018   MCV 93.5 09/14/2018   PLT 214 09/14/2018    BMET    Component Value Date/Time   NA 141 09/14/2018 1007   NA 134 (L) 10/29/2014 0908   K 4.1 09/14/2018 1007   K 4.3 10/29/2014 0908   CL 105 09/14/2018 1007   CL 103 10/29/2014 0908   CO2 25 09/14/2018 1007   CO2 24 10/29/2014 0908   GLUCOSE 165 (H) 09/14/2018 1007   GLUCOSE 142 (H) 10/29/2014 0908   BUN 23 09/14/2018 1007   BUN 20 10/29/2014 0908   CREATININE 1.32 (H) 09/14/2018 1007   CREATININE 1.32 (H) 10/29/2014 0908   CREATININE 0.96  01/28/2011 0855   CALCIUM 9.4 09/14/2018 1007   CALCIUM 9.3 10/29/2014 0908   GFRNONAA 53 (L) 09/14/2018 1007   GFRNONAA 55 (L) 10/29/2014 0908   GFRAA >60 09/14/2018 1007   GFRAA >60 10/29/2014 0908   Estimated Creatinine Clearance: 55.1 mL/min (A) (by C-G formula based on SCr of 1.32 mg/dL (H)).  COAG Lab Results  Component Value Date   INR 1.0 09/14/2018   INR 0.9 10/24/2014   INR 1.07 12/28/2011    Radiology No results found.    Assessment/Plan 1. Bilateral carotid artery stenosis The patient remains asymptomatic with respect to the carotid stenosis.  However, the patient has now progressed and has a lesion that is >70% regarding the left ICA.  After more than 30 days the patient now has cardiac clearance.  The patient should undergo left carotid endarterectomy.  The risks, benefits and alternative therapies were reviewed in detail with the patient.  All questions were answered.  The patient agrees to proceed with imaging.  Continue antiplatelet therapy as prescribed. Continue management of CAD, HTN and Hyperlipidemia. Healthy heart diet, encouraged exercise at least 4 times per week.   2. Popliteal artery aneurysm (HCC) Recommend:  The patient has evidence of atherosclerosis of the lower extremities with claudication. The patient does not voice lifestyle limiting changes at this point in time.  Noninvasive studies do not suggest clinically significant change.  No invasive studies, angiography or surgery at this time The patient should continue walking and begin a more formal exercise program.  The patient should continue antiplatelet therapy and aggressive treatment of the lipid abnormalities  No changes in the patient's medications at this time  The patient should continue wearing graduated compression socks 10-15 mmHg strength to control the mild edema.  3. AAA (abdominal aortic aneurysm) without rupture (HCC) No surgery or intervention at this  time. The patient has an asymptomatic abdominal aortic aneurysm  that is less than 4 cm in maximal diameter.  I have discussed the natural history of abdominal aortic aneurysm and the small risk of rupture for aneurysm less than 5 cm in size.  However, as these small aneurysms tend to enlarge over time, continued surveillance with ultrasound or CT scan is mandatory.  I have also discussed optimizing medical management with hypertension and lipid control and the importance of abstinence from tobacco.  The patient is also encouraged to exercise a minimum of 30 minutes 4 times a week.  Should the patient develop new onset abdominal or back pain or signs of peripheral embolization they are instructed to seek medical attention immediately and to alert the physician providing care that they have an aneurysm.  The patient voices their understanding. The patient will return in 12 months with an aortic duplex.   4. PAD (peripheral artery disease) (HCC) Recommend:  The patient has evidence of atherosclerosis of the lower extremities with claudication. The patient does not voice lifestyle limiting changes at this point in time.  Noninvasive studies do not suggest clinically significant change.  No invasive studies, angiography or surgery at this time The patient should continue walking and begin a more formal exercise program.  The patient should continue antiplatelet therapy and aggressive treatment of the lipid abnormalities  No changes in the patient's medications at this time  The patient should continue wearing graduated compression socks 10-15 mmHg strength to control the mild edema.  5. Coronary artery disease of native artery of native heart with stable angina pectoris (HCC) Continue cardiac and antihypertensive medications as already ordered and reviewed, no changes at this time.  Continue statin as ordered and reviewed, no changes at this time  Nitrates PRN for chest  pain   6. Essential hypertension Continue antihypertensive medications as already ordered, these medications have been reviewed and there are no changes at this time.   7. Centrilobular emphysema (HCC) Continue pulmonary medications and aerosols as already ordered, these medications have been reviewed and there are no changes at this time.  Levora Dredge, MD  09/21/2018 7:13 AM

## 2018-09-21 NOTE — Anesthesia Preprocedure Evaluation (Signed)
Anesthesia Evaluation  Patient identified by MRN, date of birth, ID band Patient awake    Reviewed: Allergy & Precautions, H&P , NPO status , Patient's Chart, lab work & pertinent test results, reviewed documented beta blocker date and time   History of Anesthesia Complications Negative for: history of anesthetic complications  Airway Mallampati: III  TM Distance: >3 FB Neck ROM: full    Dental  (+) Dental Advidsory Given, Teeth Intact, Missing   Pulmonary neg shortness of breath, asthma , neg sleep apnea, COPD,  COPD inhaler, neg recent URI, former smoker,           Cardiovascular Exercise Tolerance: Good hypertension, (-) angina+ CAD, + Past MI, + Cardiac Stents and + Peripheral Vascular Disease  (-) CABG (-) dysrhythmias (-) Valvular Problems/Murmurs     Neuro/Psych negative neurological ROS  negative psych ROS   GI/Hepatic Neg liver ROS, hiatal hernia, GERD  ,  Endo/Other  diabetes  Renal/GU CRFRenal disease  negative genitourinary   Musculoskeletal   Abdominal   Peds  Hematology negative hematology ROS (+)   Anesthesia Other Findings Past Medical History: No date: CAD (coronary artery disease)     Comment:  a. 1999: PCI-->LAD 2/2 MI; b. 2005: inf MI s/p PCI/DES x              4 to RCA; c. Myoview in 12/2005: EF 61%, no evidence for               ischemia; d. cath 10/2014: occluded mLAD w/ L to L and L               to R collats, dRCA 95% s/p PCI/DES 0%, mPDA 70%, LCx mild              to mod irregs, procedure complicated by inf ST ele in               recovery, repeat cath showed acute dRCA stent thrombosis               o/w occluded mRPDA, PTCA dRCA, PCI/DES RPDA, aggrastat x               18 hr No date: CKD (chronic kidney disease) No date: COPD (chronic obstructive pulmonary disease) (HCC) No date: DM2 (diabetes mellitus, type 2) (HCC) No date: H/O hiatal hernia [phone]: Heart attack (HCC)  Comment:  had 2 in one day(released plaque during angio). 5 stents              total No date: HTN (hypertension) No date: Hypercholesterolemia No date: Impaired fasting glucose     Comment:  Elevated after steroid injection 2020: Macular degeneration of right eye     Comment:  receiving injections into eye No date: Osteoarthritis of hip     Comment:  injections in both hips No date: Osteoarthritis, knee No date: PAD (peripheral artery disease) (HCC)     Comment:  L fem to below the knee popliteal vein bypass graft 12/27/2011: Peri-rectal abscess No date: Tobacco abuse     Comment:  Prior   Reproductive/Obstetrics negative OB ROS                             Anesthesia Physical Anesthesia Plan  ASA: III  Anesthesia Plan: General   Post-op Pain Management:    Induction:   PONV Risk Score and Plan: 2 and Ondansetron, Dexamethasone and Treatment may vary due to  age or medical condition  Airway Management Planned:   Additional Equipment:   Intra-op Plan:   Post-operative Plan:   Informed Consent: I have reviewed the patients History and Physical, chart, labs and discussed the procedure including the risks, benefits and alternatives for the proposed anesthesia with the patient or authorized representative who has indicated his/her understanding and acceptance.     Dental Advisory Given  Plan Discussed with: Anesthesiologist, CRNA and Surgeon  Anesthesia Plan Comments:         Anesthesia Quick Evaluation

## 2018-09-21 NOTE — Transfer of Care (Signed)
Immediate Anesthesia Transfer of Care Note  Patient: [name]  Procedure(s) Performed: ENDARTERECTOMY CAROTID (Left )  Patient Location: PACU  Anesthesia Type:General  Level of Consciousness: awake, alert , oriented and patient cooperative  Airway & Oxygen Therapy: Patient Spontanous Breathing and Patient connected to face mask oxygen  Post-op Assessment: Report given to RN and Post -op Vital signs reviewed and stable  Post vital signs: Reviewed and stable  Last Vitals:  Vitals Value Taken Time  BP 143/71 09/21/2018 10:38 AM  Temp 36.3 C 09/21/2018 10:38 AM  Pulse 80 09/21/2018 10:42 AM  Resp 21 09/21/2018 10:42 AM  SpO2 97 % 09/21/2018 10:42 AM  Vitals shown include unvalidated device data.  Last Pain:  Vitals:   09/21/18 1038  TempSrc:   PainSc: (P) 0-No pain         Complications: No apparent anesthesia complications

## 2018-09-21 NOTE — Anesthesia Procedure Notes (Signed)
Date/Time: 09/21/2018 7:44 AM Performed by: Mohammed Kindle, CRNA Pre-anesthesia Checklist: Patient identified, Emergency Drugs available, Patient being monitored and Suction available Patient Re-evaluated:Patient Re-evaluated prior to induction Oxygen Delivery Method: Circle system utilized Preoxygenation: Pre-oxygenation with 100% oxygen Induction Type: IV induction Ventilation: Mask ventilation without difficulty Laryngoscope Size: McGraph and 3 Grade View: Grade I Tube type: Oral Tube size: 7.0 mm Number of attempts: 1 Airway Equipment and Method: Stylet Placement Confirmation: ETT inserted through vocal cords under direct vision,  positive ETCO2,  CO2 detector and breath sounds checked- equal and bilateral Secured at: 21 cm Tube secured with: Tape Dental Injury: Teeth and Oropharynx as per pre-operative assessment

## 2018-09-21 NOTE — Anesthesia Post-op Follow-up Note (Signed)
Anesthesia QCDR form completed.        

## 2018-09-21 NOTE — Op Note (Signed)
[facility]   OPERATIVE NOTE  PROCEDURE:   1.  Left carotid endarterectomy with Primary Closure  PRE-OPERATIVE DIAGNOSIS: 1.  Critical stenosis of the left internal carotid Artery   POST-OPERATIVE DIAGNOSIS: same as above   SURGEON: Renford Dills, MD  ASSISTANT(S): Ms. Raul Del  ANESTHESIA: general  ESTIMATED BLOOD LOSS: 100 cc  FINDING(S): 1.  Extensive calcified carotid plaque.  SPECIMEN(S):  Carotid plaque (sent to Pathology)  INDICATIONS:   [name] is a 73 y.o. y.o. male who presents with left internal carotid stenosis of greater than 95 %.  The risks, benefits, and alternatives to carotid endarterectomy were discussed with the patient. The differences between carotid stenting and carotid endarterectomy were reviewed.  The patient voiced understanding and appears to be aware that the risks of carotid endarterectomy include but are not limited to: bleeding, infection, stroke, myocardial infarction, death, cranial nerve injuries both temporary and permanent, neck hematoma, possible airway compromise, labile blood pressure post-operatively, cerebral hyperperfusion syndrome, and possible need for additional interventions in the future. The patient is aware of the risks and agrees to proceed forward with the procedure.  DESCRIPTION: After full informed written consent was obtained from the patient, the patient was brought back to the operating room and placed supine upon the operating table.  Prior to induction, the patient received IV antibiotics.  After obtaining adequate anesthesia, the patient was placed a supine position with a shoulder roll in place and the patient's neck slightly hyperextended and rotated away from the surgical site.  The patient was prepped in the standard fashion for a carotid endarterectomy.    A first assistant was required to provide a safe and appropriate environment for executing the surgery.  The assistant was  integral in providing retraction, exposure, running suture providing suction and in the closing process.  The skin incision was made in the left neck anterior to the sternocleidomastoid muscle and dissected down through the subcutaneous tissue.  The platysmas was opened with electrocautery.  The internal jugular vein and facial vein were identified.  The facial vein is ligated and divided between 2-0 silk ties.  The omohyoid was identified in the common carotid artery exposed at this level. The dissection was there in carried out along the carotid artery in a cranial direction.  The dissection was then carried along periadventitial plane along the common carotid artery up to the bifurcation. The external carotid artery was identified. Vessel loops were then placed around the external carotid artery as well as the superior thyroid artery. In the process of this dissection, the hypoglossal nerve was identified and protected from harm.  The internal carotid artery was then dissected circumferentially just beyond an area in the internal carotid artery distal to the plaque.    At this point, we gave the patient 7000 units of intravenous heparin and this was allowed to circulate for several minutes.  The common carotid artery followed by the external carotid and then the internal carotid artery were clamped.  Arteriotomy was made in the common carotid artery with a 11 blade, and extended the arteriotomy with a Potts scissor down into the common carotid artery, then the arteriotomy was carried through the bifurcation into the internal carotid artery until I reached an area that was not diseased.  At this point, a Sundt shunt was placed without difficulty.  The endarterectomy was begun in the common carotid artery with a Runner, broadcasting/film/video and carried this dissection down into the common carotid  artery circumferentially.  Then I transected the plaque at a segment where it was adherent and transected the plaque with  Potts scissors.  I then carried this dissection up into the external carotid artery.  The plaque was extracted by unclamping the external carotid artery and performing an eversion endarterectomy.  The dissection was then carried into the internal carotid artery where a  feathered end point was created.  The plaque was passed off the field as a specimen.  The distal endpoint was tacked down with 6 interrupted 7-0 Prolene sutures.  The arteriotomy was then closed using running 6-0 Prolene beginning at the distal margin and running the second 6-0 Prolene from the common carotid end.  Prior to completing the primary repair, the shunt was removed, the internal carotid artery was flushed and there was excellent backbleeding.  The carotid artery repair was flushed with heparinized saline and then the primary repair was completed in the usual fashion.  The flow was then reestablished first to the external carotid artery and then the internal carotid artery to prevent distal embolization.   Several minutes of pressure were held and 6-0 Prolene sutures were used as need for hemostasis.  At this point, I placed Surgicel and Evicel topical hemostatic agents.  There was no more active bleeding in the surgical site.  The sternocleidomastoid space was closed with three interrupted 3-0 Vicryl sutures. I then reapproximated the platysma muscle with a running stitch of 3-0 Vicryl.  The skin was then closed with a running subcuticular 4-0 Monocryl.  The skin was then cleaned, dried and Dermabond was used to reinforce the skin closure.  The patient awakened and was taken to the recovery room in stable condition, following commands and moving all four extremities without any apparent deficits.    COMPLICATIONS: none  CONDITION: stable  Levora Dredge 09/21/2018<10:19 AM

## 2018-09-21 NOTE — OR Nursing (Signed)
Blood sugar is 289 this am. Patient took his full dose of insulin last night at 10:00 pm. Dr. Karlton Lemon notified.

## 2018-09-22 DIAGNOSIS — I6522 Occlusion and stenosis of left carotid artery: Secondary | ICD-10-CM

## 2018-09-22 LAB — CBC
HCT: 37.5 % — ABNORMAL LOW (ref 39.0–52.0)
Hemoglobin: 12.4 g/dL — ABNORMAL LOW (ref 13.0–17.0)
MCH: 30.9 pg (ref 26.0–34.0)
MCHC: 33.1 g/dL (ref 30.0–36.0)
MCV: 93.5 fL (ref 80.0–100.0)
Platelets: 177 10*3/uL (ref 150–400)
RBC: 4.01 MIL/uL — ABNORMAL LOW (ref 4.22–5.81)
RDW: 14.8 % (ref 11.5–15.5)
WBC: 12.9 10*3/uL — ABNORMAL HIGH (ref 4.0–10.5)
nRBC: 0 % (ref 0.0–0.2)

## 2018-09-22 LAB — BASIC METABOLIC PANEL
Anion gap: 10 (ref 5–15)
BUN: 34 mg/dL — AB (ref 8–23)
CO2: 21 mmol/L — ABNORMAL LOW (ref 22–32)
CREATININE: 1.64 mg/dL — AB (ref 0.61–1.24)
Calcium: 8.1 mg/dL — ABNORMAL LOW (ref 8.9–10.3)
Chloride: 108 mmol/L (ref 98–111)
GFR calc non Af Amer: 41 mL/min — ABNORMAL LOW (ref >60)
GFR, EST AFRICAN AMERICAN: 47 mL/min — AB (ref >60)
Glucose, Bld: 232 mg/dL — ABNORMAL HIGH (ref 70–99)
Potassium: 4.6 mmol/L (ref 3.5–5.1)
SODIUM: 139 mmol/L (ref 135–145)

## 2018-09-22 LAB — MAGNESIUM: Magnesium: 1.8 mg/dL (ref 1.7–2.4)

## 2018-09-22 LAB — PHOSPHORUS: PHOSPHORUS: 2.4 mg/dL — AB (ref 2.5–4.6)

## 2018-09-22 MED ORDER — NON FORMULARY: Freq: Two times a day (BID) | Status: DC

## 2018-09-22 MED ORDER — ATORVASTATIN CALCIUM 20 MG PO TABS: 20.0000 mg | ORAL_TABLET | Freq: Every day | ORAL | Status: DC

## 2018-09-22 MED ORDER — ACETAMINOPHEN 325 MG PO TABS: 325.0000 mg | ORAL_TABLET | ORAL | 0 refills | Status: AC | PRN

## 2018-09-22 MED ORDER — ASPIRIN 81 MG PO CHEW: 324.0000 mg | CHEWABLE_TABLET | Freq: Once | ORAL | Status: DC

## 2018-09-22 NOTE — Anesthesia Postprocedure Evaluation (Signed)
Anesthesia Post Note  Patient: [name]  Procedure(s) Performed: ENDARTERECTOMY CAROTID (Left )  Patient location during evaluation: ICU Anesthesia Type: General Level of consciousness: awake and alert Pain management: pain level controlled Vital Signs Assessment: post-procedure vital signs reviewed and stable Respiratory status: spontaneous breathing, nonlabored ventilation and respiratory function stable Cardiovascular status: stable Postop Assessment: no apparent nausea or vomiting Anesthetic complications: no     Last Vitals:  Vitals:   09/22/18 1000 09/22/18 1100  BP: 112/73 112/77  Pulse: 83 (!) 40  Resp:    Temp:    SpO2: 94% 94%    Last Pain:  Vitals:   09/22/18 0800  TempSrc: Oral  PainSc: 0-No pain                 Jovita Gamma

## 2018-09-22 NOTE — Discharge Summary (Signed)
Physician Discharge Summary  Patient ID: [name] MRN: [id number] DOB/AGE: 09/22/1945 72 y.o.  Admit date: 09/21/2018 Discharge date: 09/22/2018  Admission Diagnoses:  Discharge Diagnoses:  Active Problems:   Carotid artery stenosis without cerebral infarction, left   Discharged Condition: good  Hospital Course: Patient underwent a left carotid endarterectomy. He has done well overnight. He does have a sore throat but he is swallowing okay and has no issues with breathing. He has some tenderness on the neck but can move well. He has urinated without issue and walked around a bit.  Consults: None  Significant Diagnostic Studies: labs: mild disturbances but nothing critical  Treatments: IV hydration and surgery: left carotid endarterectomy  Discharge Exam: Blood pressure 97/73, pulse 65, temperature 98.2 F (36.8 C), temperature source Oral, resp. rate 16, SpO2 96 %. General appearance: alert, cooperative and appears stated age Neck: no JVD, supple, symmetrical, trachea midline and thyroid not enlarged, symmetric, no tenderness/mass/nodules Neurologic: Alert and oriented X 3, normal strength and tone. Normal symmetric reflexes. Normal coordination and gait Incision/Wound: CDI without cellulitis  Disposition: Discharge disposition: 01-Home or Self Care       Discharge Instructions    CAROTID Sugery: Call MD for difficulty swallowing or speaking; weakness in arms or legs that is a new symtom; severe headache.  If you have increased swelling in the neck and/or  are having difficulty breathing, CALL 911   Complete by:  As directed    Call MD for:  redness, tenderness, or signs of infection (pain, swelling, bleeding, redness, odor or green/yellow discharge around incision site)   Complete by:  As directed    Call MD for:  severe or increased pain, loss or decreased feeling  in affected limb(s)   Complete by:  As directed    Call MD for:  temperature >100.5   Complete by:   As directed    Discharge instructions   Complete by:  As directed    Do not work until after follow up with Dr. Schnier   Driving Restrictions   Complete by:  As directed    No driving for 1 week   Increase activity slowly   Complete by:  As directed    Walk with assistance use walker or cane as needed   Lifting restrictions   Complete by:  As directed    No lifting for 2 weeks   May shower    Complete by:  As directed    No dressing needed   Complete by:  As directed    Replace only if drainage present   Resume previous diet   Complete by:  As directed      Allergies as of 09/22/2018      Reactions   Augmentin [amoxicillin-pot Clavulanate] Other (See Comments)   Nausea,vomiting,diarrhea   Morphine And Related Nausea And Vomiting   Vomiting, GI upset      Medication List    TAKE these medications   acetaminophen 325 MG tablet Commonly known as:  TYLENOL Take 1-2 tablets (325-650 mg total) by mouth every 4 (four) hours as needed for mild pain (or temp >/= 101 F).   albuterol 108 (90 Base) MCG/ACT inhaler Commonly known as:  ProAir HFA Inhale 2 puffs into the lungs 3 (three) times daily as needed (cough).   Antivert 25 MG tablet Generic drug:  meclizine Take 25 mg by mouth every 6 (six) hours as needed. For dizziness/vertigo   aspirin 81 MG EC tablet Take 1 tablet (81   mg total) by mouth daily.   atorvastatin 40 MG tablet Commonly known as:  LIPITOR Take 1 tablet (40 mg total) by mouth daily.   blood glucose meter kit and supplies Kit 1 each by Does not apply route daily as needed. Dispense based on patient and insurance preference. Use up to four times daily as directed. (FOR ICD-9 250.00, 250.01).   Brilinta 60 MG Tabs tablet Generic drug:  ticagrelor TAKE 1 TABLET BY MOUTH TWICE DAILY   fenofibrate 145 MG tablet Commonly known as:  TRICOR TAKE 1 TABLET BY MOUTH DAILY   Fluticasone-Salmeterol 250-50 MCG/DOSE Aepb Commonly known as:  Advair Diskus Inhale  1 puff into the lungs 2 (two) times daily.   glipiZIDE 5 MG tablet Commonly known as:  GLUCOTROL Take 1 tablet (5 mg total) by mouth 2 (two) times daily before a meal.   glucosamine-chondroitin 500-400 MG tablet Take 1 tablet by mouth 2 (two) times daily.   hydrochlorothiazide 25 MG tablet Commonly known as:  HYDRODIURIL TAKE 1 TABLET BY MOUTH EVERY DAY   Insulin Glargine 100 UNIT/ML Solostar Pen Commonly known as:  Lantus SoloStar Inject up to 35 units per day   Insulin Pen Needle 31G X 5 MM Misc Commonly known as:  B-D UF III MINI PEN NEEDLES Use as instructed to inject insulin.  Diagnosis:  250.00  Insulin dependent.   lisinopril 40 MG tablet Commonly known as:  PRINIVIL,ZESTRIL Take 1 tablet (40 mg total) by mouth daily.   metFORMIN 500 MG tablet Commonly known as:  GLUCOPHAGE TAKE 2 TABLETS BY MOUTH TWICE A DAY WITH A MEAL   metoprolol tartrate 25 MG tablet Commonly known as:  LOPRESSOR TAKE 1/2 TABLET BY MOUTH TWICE DAILY   nitroGLYCERIN 0.4 MG SL tablet Commonly known as:  NITROSTAT Place 1 tablet (0.4 mg total) under the tongue every 5 (five) minutes as needed. For chest pain   PRESERVISION AREDS 2 PO Take 1 tablet by mouth 2 (two) times daily.   Ultram 50 MG tablet Generic drug:  traMADol Take 1 tablet (50 mg total) by mouth every 6 (six) hours as needed.        Signed: Kirk L Charles 09/22/2018, 10:37 AM  

## 2018-09-23 ENCOUNTER — Encounter: Payer: Self-pay | Admitting: Vascular Surgery

## 2018-09-24 LAB — GLUCOSE, CAPILLARY: GLUCOSE-CAPILLARY: 233 mg/dL — AB (ref 70–99)

## 2018-09-24 LAB — SURGICAL PATHOLOGY

## 2018-09-25 ENCOUNTER — Telehealth (INDEPENDENT_AMBULATORY_CARE_PROVIDER_SITE_OTHER): Payer: Self-pay | Admitting: Vascular Surgery

## 2018-09-25 ENCOUNTER — Other Ambulatory Visit (INDEPENDENT_AMBULATORY_CARE_PROVIDER_SITE_OTHER): Payer: Self-pay | Admitting: Nurse Practitioner

## 2018-09-25 ENCOUNTER — Other Ambulatory Visit: Payer: Self-pay | Admitting: *Deleted

## 2018-09-25 NOTE — Patient Outreach (Signed)
[facility]) Care Management  09/25/2018  [name] [birth year] [id number]   Subjective: Telephone call to patient's home number, spoke with patient, and HIPAA verified.  Discussed [facility] Care Management Medicare EMMI General Discharge follow up, patient voiced understanding, and is in agreement to follow up.  Patient states he is doing well, has a follow up appointment with surgeon on 09/28/2018, site looks good, decreasing, and things are going very well.   States he or his wife may have received  EMMI automated calls and does have a follow up appointment scheduled.  Patient is aware of signs/ symptoms to report to MD and how to reach MD after hours if needed. Patient states he does not have any education material, EMMI follow up, care coordination, care management, disease monitoring, transportation, community resource, or pharmacy needs at this time.   States he is very appreciative of the follow up.     Objective: Per KPN (Knowledge Performance Now, point of care tool) and chart review, patient hospitalized 09/21/2018 -09/22/2018 for Carotid artery stenosis without cerebral infarction, left, status post left carotid endarterectomy on 09/21/2018.    Patient also has a history of COPD, diabetes, hypertension, CAD, heart attack, CKD (chronic kidney disease), Hypercholesterolemia, Osteoarthritis of hip, Osteoarthritis, knee, and PAD (peripheral artery disease).      Assessment: Received Medicare EMMI General Discharge Red Flag Alert follow up referral on 09/25/2018. Red Flag alert trigger, Day #1, patient answered no to the following question: Scheduled follow-up?   EMMI follow up completed and no further care management needs.      Plan: RNCM will complete case closure due to follow up completed / no care management needs.       Basha Krygier H. Gardiner Barefoot, BSN, CCM [facility] Care Management [facility] Telephonic CM Phone: [phone] Fax: [phone]

## 2018-09-25 NOTE — Telephone Encounter (Signed)
Patient had surgery recently and a homehealth referral was put in. Patient wife calling to see if they have to use homehealth.  Spoke with Vivia Birmingham, she said patient has right to refuse homehealth if they wish. Just to make sure wound dressings are done.   Spoke with patient, he stated no dressing on wound because would was glued shut. Wound looks good, no redness or swelling, no discharge or pain. Patient has follow up in our office Friday. Patient is aware to call our office for any changes in the wound. Patient is going to refuse homehealth. AS, CMA

## 2018-09-28 ENCOUNTER — Other Ambulatory Visit: Payer: Self-pay

## 2018-09-28 ENCOUNTER — Ambulatory Visit (INDEPENDENT_AMBULATORY_CARE_PROVIDER_SITE_OTHER): Payer: Medicare Other | Admitting: Nurse Practitioner

## 2018-09-28 ENCOUNTER — Encounter (INDEPENDENT_AMBULATORY_CARE_PROVIDER_SITE_OTHER): Payer: Self-pay | Admitting: Nurse Practitioner

## 2018-09-28 VITALS — BP 116/67 | HR 70 | Resp 12 | Ht 68.0 in | Wt 197.0 lb

## 2018-09-28 DIAGNOSIS — I6523 Occlusion and stenosis of bilateral carotid arteries: Secondary | ICD-10-CM

## 2018-09-28 DIAGNOSIS — Z79899 Other long term (current) drug therapy: Secondary | ICD-10-CM

## 2018-09-28 DIAGNOSIS — J432 Centrilobular emphysema: Secondary | ICD-10-CM

## 2018-09-28 DIAGNOSIS — I10 Essential (primary) hypertension: Secondary | ICD-10-CM

## 2018-09-28 DIAGNOSIS — Z7982 Long term (current) use of aspirin: Secondary | ICD-10-CM

## 2018-09-28 DIAGNOSIS — Z87891 Personal history of nicotine dependence: Secondary | ICD-10-CM

## 2018-09-28 NOTE — Progress Notes (Signed)
SUBJECTIVE:  Patient ID: [name], male    DOB: 21-Jun-1946, 72 y.o.   MRN: [id number] Chief Complaint  Patient presents with  . Follow-up    HPI  [name] is a 73 y.o. male The patient is seen for follow up evaluation of carotid stenosis status post left carotid endarterectomy on 09/21/2018.  There were no post operative problems or complications related to the surgery.  The patient denies neck or incisional pain.  Incision appears clean, dry and intact. Minimal swelling.   The patient denies interval amaurosis fugax. There is no recent history of TIA symptoms or focal motor deficits. There is no prior documented CVA.  The patient denies headache.  The patient is taking enteric-coated aspirin 81 mg daily.  The patient has a history of coronary artery disease, no recent episodes of angina or shortness of breath. The patient denies PAD or claudication symptoms. There is a history of hyperlipidemia which is being treated with a statin.    Past Medical History:  Diagnosis Date  . CAD (coronary artery disease)    a. 1999: PCI-->LAD 2/2 MI; b. 2005: inf MI s/p PCI/DES x 4 to RCA; c. Myoview in 12/2005: EF 61%, no evidence for ischemia; d. cath 10/2014: occluded mLAD w/ L to L and L to R collats, dRCA 95% s/p PCI/DES 0%, mPDA 70%, LCx mild to mod irregs, procedure complicated by inf ST ele in recovery, repeat cath showed acute dRCA stent thrombosis o/w occluded mRPDA, PTCA dRCA, PCI/DES RPDA, aggrastat x 18 hr  . CKD (chronic kidney disease)   . COPD (chronic obstructive pulmonary disease) ([place])   . DM2 (diabetes mellitus, type 2) ([place])   . H/O hiatal hernia   . Heart attack ([place]) [phone]   had 2 in one day(released plaque during angio). 5 stents total  . HTN (hypertension)   . Hypercholesterolemia   . Impaired fasting glucose    Elevated after steroid injection  . Macular degeneration of right eye 2020   receiving injections into eye  . Osteoarthritis of hip     injections in both hips  . Osteoarthritis, knee   . PAD (peripheral artery disease) ([facility])    L fem to below the knee popliteal vein bypass graft  . Peri-rectal abscess 12/27/2011  . Tobacco abuse    Prior    Past Surgical History:  Procedure Laterality Date  . CORONARY ANGIOPLASTY    . [place], 2006, 2016 x 2   Multiple, LAD in 1999, RCA in 2005.  has a total of 4 stents  . ENDARTERECTOMY Left 09/21/2018   Procedure: ENDARTERECTOMY CAROTID;  Surgeon: Katha Cabal, MD;  Location: [facility];  Service: Vascular;  Laterality: Left;  . FEMORAL-POPLITEAL BYPASS GRAFT  2016   L fem to below the knee popliteal vein bypass graft  . INCISE AND DRAIN ABCESS  2007   on scrotum  . INCISION AND DRAINAGE PERIRECTAL ABSCESS  12/28/2011   Procedure: IRRIGATION AND DEBRIDEMENT PERIRECTAL ABSCESS;  Surgeon: Merrie Roof, MD;  Location: [facility];  Service: General;  Laterality: N/A;    Social History   Socioeconomic History  . Marital status: Married    Spouse name: wanda  . Number of children: 2  . Years of education: Not on file  . Highest education level: Not on file  Occupational History  . 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  Financial resource strain: Not on file  . Food insecurity:    Worry: Not on file    Inability: Not on file  . Transportation needs:    Medical: Not on file    Non-medical: Not on file  Tobacco Use  . Smoking status: Former Smoker    Packs/day: 1.00    Years: 40.00    Pack years: 40.00    Types: Cigarettes    Last attempt to quit: 10/21/1998    Years since quitting: 19.9  . Smokeless tobacco: Never Used  . Tobacco comment: Past smoker, states he will smoke maybe 1 cigarette/ month or so still  Substance and Sexual Activity  . Alcohol use: Yes    Alcohol/week: 0.0 standard drinks    Comment: Occasional beer on the weekends  . Drug use: No  . Sexual activity: Never   Lifestyle  . Physical activity:    Days per week: Not on file    Minutes per session: Not on file  . Stress: Not on file  Relationships  . Social connections:    Talks on phone: Not on file    Gets together: Not on file    Attends religious service: Not on file    Active member of club or organization: Not on file    Attends meetings of clubs or organizations: Not on file    Relationship status: Not on file  . Intimate partner violence:    Fear of current or ex partner: Not on file    Emotionally abused: Not on file    Physically abused: Not on file    Forced sexual activity: Not on file  Other Topics Concern  . Not on file  Social History Narrative   Lives in [place]   Married 50+ years   2 grown daughters, 7 grandchildren, 4 great grandchildren   Designated Party Release Form signed on 01/19/10 appointing Gerhard Munch.     Family History  Problem Relation Age of Onset  . Heart failure Father        CHF  . CAD Mother   . Diabetes Mother   . Colon cancer Neg Hx   . Prostate cancer Neg Hx     Allergies  Allergen Reactions  . Augmentin [Amoxicillin-Pot Clavulanate] Other (See Comments)    Nausea,vomiting,diarrhea  . Morphine And Related Nausea And Vomiting    Vomiting, GI upset     Review of Systems   Review of Systems: Negative Unless Checked Constitutional: [] Weight loss  [] Fever  [] Chills Cardiac: [] Chest pain   []  Atrial Fibrillation  [] Palpitations   [] Shortness of breath when laying flat   [] Shortness of breath with exertion. [] Shortness of breath at rest Vascular:  [] Pain in legs with walking   [] Pain in legs with standing [] Pain in legs when laying flat   [] Claudication    [] Pain in feet when laying flat    [] History of DVT   [] Phlebitis   [] Swelling in legs   [] Varicose veins   [] Non-healing ulcers Pulmonary:   [] Uses home oxygen   [] Productive cough   [] Hemoptysis   [] Wheeze  [x] COPD   [] Asthma Neurologic:  [] Dizziness   [] Seizures  [] Blackouts [] History  of stroke   [] History of TIA  [] Aphasia   [] Temporary Blindness   [] Weakness or numbness in arm   [] Weakness or numbness in leg Musculoskeletal:   [] Joint swelling   [] Joint pain   [] Low back pain  []  History of Knee Replacement [] Arthritis [] back Surgeries  []  Spinal  Stenosis    Hematologic:  [] Easy bruising  [] Easy bleeding   [] Hypercoagulable state   [] Anemic Gastrointestinal:  [] Diarrhea   [] Vomiting  [] Gastroesophageal reflux/heartburn   [] Difficulty swallowing. [] Abdominal pain Genitourinary:  [] Chronic kidney disease   [] Difficult urination  [] Anuric   [] Blood in urine [] Frequent urination  [] Burning with urination   [] Hematuria Skin:  [] Rashes   [] Ulcers [] Wounds Psychological:  [] History of anxiety   []  History of major depression  []  Memory Difficulties      OBJECTIVE:   Physical Exam  BP 116/67 (BP Location: Left Arm, Patient Position: Sitting, Cuff Size: Small)   Pulse 70   Resp 12   Ht 5' 8"  (1.727 m)   Wt 197 lb (89.4 kg)   BMI 29.95 kg/m   Gen: WD/WN, NAD Head: [place]/AT, No temporalis wasting.  Ear/Nose/Throat: Hearing grossly intact, nares w/o erythema or drainage Eyes: PER, EOMI, sclera nonicteric.  Neck: Supple, no masses.  No JVD.  Pulmonary:  Good air movement, no use of accessory muscles.  Cardiac: RRR Vascular: Incision is clean dry and intact, glue still in place  Vessel Right Left  Radial Palpable Palpable   Gastrointestinal: soft, non-distended. No guarding/no peritoneal signs.  Musculoskeletal: M/S 5/5 throughout.  No deformity or atrophy.  Neurologic: Pain and light touch intact in extremities.  Symmetrical.  Speech is fluent. Motor exam as listed above. Psychiatric: Judgment intact, Mood & affect appropriate for pt's clinical situation. Dermatologic: No Venous rashes. No Ulcers Noted.  No changes consistent with cellulitis. Lymph : No Cervical lymphadenopathy, no lichenification or skin changes of chronic lymphedema.       ASSESSMENT AND PLAN:  1.  Bilateral carotid artery stenosis Recommend:  The patient is s/p successful left  CEA  Continue antiplatelet therapy as prescribed Continue management of CAD, HTN and Hyperlipidemia Healthy heart diet,  encouraged exercise at least 4 times per week  Follow up in 3 months with duplex ultrasound and physical exam based on the patient's carotid surgery   - VAS US CAROTID; Future  2. Essential hypertension Continue antihypertensive medications as already ordered, these medications have been reviewed and there are no changes at this time.   3. Centrilobular emphysema ([place]) Continue pulmonary medications and aerosols as already ordered, these medications have been reviewed and there are no changes at this time.     Current Outpatient Medications on File Prior to Visit  Medication Sig Dispense Refill  . acetaminophen (TYLENOL) 325 MG tablet Take 1-2 tablets (325-650 mg total) by mouth every 4 (four) hours as needed for mild pain (or temp >/= 101 F). 100 tablet 0  . albuterol (PROAIR HFA) 108 (90 BASE) MCG/ACT inhaler Inhale 2 puffs into the lungs 3 (three) times daily as needed (cough). 18 g 2  . aspirin 81 MG EC tablet Take 1 tablet (81 mg total) by mouth daily. 30 tablet 0  . atorvastatin (LIPITOR) 40 MG tablet Take 1 tablet (40 mg total) by mouth daily. 90 tablet 3  . blood glucose meter kit and supplies KIT 1 each by Does not apply route daily as needed. Dispense based on patient and insurance preference. Use up to four times daily as directed. (FOR ICD-9 250.00, 250.01). 1 each 0  . BRILINTA 60 MG TABS tablet TAKE 1 TABLET BY MOUTH TWICE DAILY 180 tablet 3  . fenofibrate (TRICOR) 145 MG tablet TAKE 1 TABLET BY MOUTH DAILY 30 tablet 6  . Fluticasone-Salmeterol (ADVAIR DISKUS) 250-50 MCG/DOSE AEPB Inhale 1 puff into the lungs  2 (two) times daily. 3 each 3  . glipiZIDE (GLUCOTROL) 5 MG tablet Take 1 tablet (5 mg total) by mouth 2 (two) times daily before a meal. 180 tablet 3  .  glucosamine-chondroitin 500-400 MG tablet Take 1 tablet by mouth 2 (two) times daily.    . hydrochlorothiazide (HYDRODIURIL) 25 MG tablet TAKE 1 TABLET BY MOUTH EVERY DAY 90 tablet 3  . Insulin Glargine (LANTUS SOLOSTAR) 100 UNIT/ML Solostar Pen Inject up to 35 units per day 5 pen PRN  . Insulin Pen Needle (B-D UF III MINI PEN NEEDLES) 31G X 5 MM MISC Use as instructed to inject insulin.  Diagnosis:  250.00  Insulin dependent. 100 each 3  . lisinopril (PRINIVIL,ZESTRIL) 40 MG tablet Take 1 tablet (40 mg total) by mouth daily. 90 tablet 3  . meclizine (ANTIVERT) 25 MG tablet Take 25 mg by mouth every 6 (six) hours as needed. For dizziness/vertigo    . metFORMIN (GLUCOPHAGE) 500 MG tablet TAKE 2 TABLETS BY MOUTH TWICE A DAY WITH A MEAL 360 tablet 1  . metoprolol tartrate (LOPRESSOR) 25 MG tablet TAKE 1/2 TABLET BY MOUTH TWICE DAILY 90 tablet 3  . nitroGLYCERIN (NITROSTAT) 0.4 MG SL tablet Place 1 tablet (0.4 mg total) under the tongue every 5 (five) minutes as needed. For chest pain 25 tablet 12  . traMADol (ULTRAM) 50 MG tablet Take 1 tablet (50 mg total) by mouth every 6 (six) hours as needed. 30 tablet 0  . Multiple Vitamins-Minerals (PRESERVISION AREDS 2 PO) Take 1 tablet by mouth 2 (two) times daily.     No current facility-administered medications on file prior to visit.     There are no Patient Instructions on file for this visit. No follow-ups on file.   Kris Hartmann, NP  This note was completed with Sales executive.  Any errors are purely unintentional.

## 2018-10-14 ENCOUNTER — Other Ambulatory Visit: Payer: Self-pay | Admitting: Cardiovascular Disease

## 2018-10-18 ENCOUNTER — Telehealth (INDEPENDENT_AMBULATORY_CARE_PROVIDER_SITE_OTHER): Payer: Self-pay

## 2018-10-18 NOTE — Telephone Encounter (Signed)
Patient had a left cea 09/21/18 and called stating that the left side of the neck has little tender and when he swallow it some soreness.I spoke [place] and advise that these symptoms is normal for 23months after this procedure and patient was called and giving medical advice.

## 2018-11-06 DIAGNOSIS — H353211 Exudative age-related macular degeneration, right eye, with active choroidal neovascularization: Secondary | ICD-10-CM | POA: Diagnosis not present

## 2018-11-06 NOTE — Progress Notes (Signed)
Brett Tata T. Noeh Sparacino, MD Primary Care and [place] at [facility] [place], [address] Phone: [phone]  FAX: [phone]  [name] - 73 y.o. male  MRN [id number]  Date of Birth: Oct 17, 1945  Visit Date: 11/07/2018  PCP: Tonia Ghent, MD  Referred by: Tonia Ghent, MD  Chief Complaint  Patient presents with  . Hip Pain    Left-Hurts mainly when laying down   Subjective:   [name] is a 73 y.o. very pleasant male patient who presents with the following:  Nice guy who I have seen before with R GTB: Now he is having L sided hip pain.  Primarily in the past he has been having all right-sided symptoms.  Now his left is quite a bit worse.  He is having a great deal of pain when lying on his side at night.  He denies any back pain, buttocks pain, radicular symptoms.  He is not having any significant knee pain.  He does have a firm mattress, which he thinks is exacerbating symptoms at night.  He is not had any recent trauma or injury.  The radiological images were independently reviewed by myself in the office and results were reviewed with the patient. My independent interpretation of images: Plain films shows well-preserved joint space on the left in the weightbearing AP.  No fracture, dislocation, or avascular necrosis.Electronically Signed  By: Owens Loffler, MD On: 11/07/2018  8:20 AM EDT   Past Medical History, Surgical History, Social History, Family History, Problem List, Medications, and Allergies have been reviewed and updated if relevant.  Patient Active Problem List   Diagnosis Date Noted  . Carotid artery stenosis without cerebral infarction, left 09/21/2018  . Abnormal dreams 08/05/2018  . Creatinine elevation 08/05/2018  . Dysuria 01/14/2018  . CAD (coronary artery disease), native coronary artery 07/16/2017  . Shoulder pain 12/30/2016  . Skin lesion 12/30/2016  . Unstable angina  ([place]) 09/03/2016  . AAA (abdominal aortic aneurysm) without rupture ([place]) 07/13/2016  . Popliteal artery aneurysm ([place]) 07/13/2016  . Carotid artery disease ([place]) 01/30/2015  . NSTEMI (non-ST elevated myocardial infarction) ([place]) 11/16/2014  . Medicare annual wellness visit, initial 10/21/2014  . Advance care planning 10/21/2014  . PAD (peripheral artery disease) ([place]) 09/09/2014  . COPD exacerbation ([place]) 08/21/2014  . DM (diabetes mellitus), type 2 with peripheral vascular complications ([place]) 32/35/5732  . Back pain 02/24/2013  . Cough 01/26/2011  . COPD (chronic obstructive pulmonary disease) ([place]) 12/26/2010  . FATIGUE 05/11/2009  . Essential hypertension 05/08/2009  . Hyperlipidemia 06/20/2008    Past Medical History:  Diagnosis Date  . CAD (coronary artery disease)    a. 1999: PCI-->LAD 2/2 MI; b. 2005: inf MI s/p PCI/DES x 4 to RCA; c. Myoview in 12/2005: EF 61%, no evidence for ischemia; d. cath 10/2014: occluded mLAD w/ L to L and L to R collats, dRCA 95% s/p PCI/DES 0%, mPDA 70%, LCx mild to mod irregs, procedure complicated by inf ST ele in recovery, repeat cath showed acute dRCA stent thrombosis o/w occluded mRPDA, PTCA dRCA, PCI/DES RPDA, aggrastat x 18 hr  . CKD (chronic kidney disease)   . COPD (chronic obstructive pulmonary disease) ([place])   . DM2 (diabetes mellitus, type 2) ([place])   . H/O hiatal hernia   . Heart attack ([place]) [phone]   had 2 in one day(released plaque during angio). 5 stents total  . HTN (hypertension)   .  Hypercholesterolemia   . Impaired fasting glucose    Elevated after steroid injection  . Macular degeneration of right eye 2020   receiving injections into eye  . Osteoarthritis of hip    injections in both hips  . Osteoarthritis, knee   . PAD (peripheral artery disease) ([facility])    L fem to below the knee popliteal vein bypass graft  . Peri-rectal abscess 12/27/2011  . Tobacco abuse    Prior    Past Surgical History:  Procedure  Laterality Date  . CORONARY ANGIOPLASTY    . [place], 2006, 2016 x 2   Multiple, LAD in 1999, RCA in 2005.  has a total of 4 stents  . ENDARTERECTOMY Left 09/21/2018   Procedure: ENDARTERECTOMY CAROTID;  Surgeon: Katha Cabal, MD;  Location: [facility];  Service: Vascular;  Laterality: Left;  . FEMORAL-POPLITEAL BYPASS GRAFT  2016   L fem to below the knee popliteal vein bypass graft  . INCISE AND DRAIN ABCESS  2007   on scrotum  . INCISION AND DRAINAGE PERIRECTAL ABSCESS  12/28/2011   Procedure: IRRIGATION AND DEBRIDEMENT PERIRECTAL ABSCESS;  Surgeon: Merrie Roof, MD;  Location: [facility];  Service: General;  Laterality: N/A;    Social History   Socioeconomic History  . Marital status: Married    Spouse name: wanda  . Number of children: 2  . Years of education: Not on file  . Highest education level: Not on file  Occupational History  . 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 Financial resource strain: Not on file  . Food insecurity:    Worry: Not on file    Inability: Not on file  . Transportation needs:    Medical: Not on file    Non-medical: Not on file  Tobacco Use  . Smoking status: Former Smoker    Packs/day: 1.00    Years: 40.00    Pack years: 40.00    Types: Cigarettes    Last attempt to quit: 10/21/1998    Years since quitting: 20.0  . Smokeless tobacco: Never Used  . Tobacco comment: Past smoker, states he will smoke maybe 1 cigarette/ month or so still  Substance and Sexual Activity  . Alcohol use: Yes    Alcohol/week: 0.0 standard drinks    Comment: Occasional beer on the weekends  . Drug use: No  . Sexual activity: Never  Lifestyle  . Physical activity:    Days per week: Not on file    Minutes per session: Not on file  . Stress: Not on file  Relationships  . Social connections:    Talks on phone: Not on file    Gets together: Not on file    Attends religious  service: Not on file    Active member of club or organization: Not on file    Attends meetings of clubs or organizations: Not on file    Relationship status: Not on file  . Intimate partner violence:    Fear of current or ex partner: Not on file    Emotionally abused: Not on file    Physically abused: Not on file    Forced sexual activity: Not on file  Other Topics Concern  . Not on file  Social History Narrative   Lives in [place]   Married 50+ years   2 grown daughters, 7 grandchildren, 4 great grandchildren   Radio producer  Release Form signed on 01/19/10 appointing Gerhard Munch.     Family History  Problem Relation Age of Onset  . Heart failure Father        CHF  . CAD Mother   . Diabetes Mother   . Colon cancer Neg Hx   . Prostate cancer Neg Hx     Allergies  Allergen Reactions  . Augmentin [Amoxicillin-Pot Clavulanate] Other (See Comments)    Nausea,vomiting,diarrhea  . Morphine And Related Nausea And Vomiting    Vomiting, GI upset    Medication list reviewed and updated in full in University.  GEN: No fevers, chills. Nontoxic. Primarily MSK c/o today. MSK: Detailed in the HPI GI: tolerating PO intake without difficulty Neuro: No numbness, parasthesias, or tingling associated. Otherwise the pertinent positives of the ROS are noted above.   Objective:   BP 130/66   Pulse 73   Temp 98.3 F (36.8 C) (Oral)   Ht _0  (1.727 m)   Wt 202 lb 8 oz (91.9 kg)   BMI 30.79 kg/m    GEN: WDWN, NAD, Non-toxic, Alert & Oriented x 3 HEENT: Atraumatic, Normocephalic.  Ears and Nose: No external deformity. EXTR: No clubbing/cyanosis/edema NEURO: Normal gait.  PSYCH: Normally interactive. Conversant. Not depressed or anxious appearing.  Calm demeanor.   HIP EXAM: SIDE: B ROM: Abduction, Flexion, Internal and External range of motion: full Pain with terminal IROM and EROM: L with some lateral pain, no groin pain GTB: Tender and palpable fluid in the bursa  SLR: NEG Knees: No effusion FABER: NT REVERSE FABER: NT, neg Piriformis: NT at direct palpation All on L, R is 5/5 Str: flexion: 4/5 abduction: 4 to 4+/5 adduction: 5/5 Strength testing non-tender   Radiology: No results found.  Assessment and Plan:   Greater trochanteric bursitis of left hip  Weakness of left hip  Abdominal weakness  Obesity (BMI 30.0-34.9)  >25 minutes spent in face to face time with patient, >50% spent in counselling or coordination of care   I really think that the heart of his problem is core weakness, some pelvic weakness, and abdominal obesity.  He has a hard time even sitting up from a lying position.  This puts him at significant risk for recurrent trochanteric bursitis.  I went over with this in detail with him, and I gave him a rehab program for his core and pelvic stability from the [employer].  For acute pain relief, I will do a GTB injection.  Aspiration/Injection Procedure Note AZARIEL BANIK [birth year] Date of procedure: 11/07/2018  Procedure: Large Joint Aspiration / Injection of Hip, Trochanteric Bursa, LEFT Indications: Pain  Procedure Details Verbal consent obtained. Risks (including infection, potential atrophy), benefits, and alternatives reviewed. Greater trochanter sterilely prepped with Chloraprep. Ethyl Chloride used for anesthesia. 8 cc of Lidocaine 1% injected with 2 mL of Depo-Medrol 40 mg into trochanteric bursa at area of maximal tenderness at greater trochanter. Needle taken to bone to troch bursa, flows easily. Bursa massaged. No bleeding and no complications. Decreased pain after injection. Needle: 22 gauge spinal needle Medication: 2 mL of Depo-Medrol 40 mg, equaling Depo-Medrol 80 mg total   Follow-up: prn only  Signed,  Forestine Macho T. Alexine Pilant, MD   Outpatient Encounter Medications as of 11/07/2018  Medication Sig  . acetaminophen (TYLENOL) 325 MG tablet Take 1-2 tablets (325-650 mg  total) by mouth every 4 (four) hours as needed for mild pain (or temp >/= 101 F).  Marland Kitchen albuterol (PROAIR  HFA) 108 (90 BASE) MCG/ACT inhaler Inhale 2 puffs into the lungs 3 (three) times daily as needed (cough).  Marland Kitchen aspirin 81 MG EC tablet Take 1 tablet (81 mg total) by mouth daily.  Marland Kitchen atorvastatin (LIPITOR) 40 MG tablet TAKE 1 TABLET(40 MG) BY MOUTH DAILY  . blood glucose meter kit and supplies KIT 1 each by Does not apply May daily as needed. Dispense based on patient and insurance preference. Use up to four times daily as directed. (FOR ICD-9 250.00, 250.01).  . BRILINTA 60 MG TABS tablet TAKE 1 TABLET BY MOUTH TWICE DAILY  . fenofibrate (TRICOR) 145 MG tablet TAKE 1 TABLET BY MOUTH DAILY  . Fluticasone-Salmeterol (ADVAIR DISKUS) 250-50 MCG/DOSE AEPB Inhale 1 puff into the lungs 2 (two) times daily.  Marland Kitchen glipiZIDE (GLUCOTROL) 5 MG tablet Take 1 tablet (5 mg total) by mouth 2 (two) times daily before a meal.  . glucosamine-chondroitin 500-400 MG tablet Take 1 tablet by mouth 2 (two) times daily.  . hydrochlorothiazide (HYDRODIURIL) 25 MG tablet TAKE 1 TABLET BY MOUTH EVERY DAY  . Insulin Glargine (LANTUS SOLOSTAR) 100 UNIT/ML Solostar Pen Inject up to 35 units per day  . Insulin Pen Needle (B-D UF III MINI PEN NEEDLES) 31G X 5 MM MISC Use as instructed to inject insulin.  Diagnosis:  250.00  Insulin dependent.  Marland Kitchen lisinopril (PRINIVIL,ZESTRIL) 40 MG tablet Take 1 tablet (40 mg total) by mouth daily.  . meclizine (ANTIVERT) 25 MG tablet Take 25 mg by mouth every 6 (six) hours as needed. For dizziness/vertigo  . metFORMIN (GLUCOPHAGE) 500 MG tablet TAKE 2 TABLETS BY MOUTH TWICE A DAY WITH A MEAL  . metoprolol tartrate (LOPRESSOR) 25 MG tablet TAKE 1/2 TABLET BY MOUTH TWICE DAILY  . Multiple Vitamins-Minerals (PRESERVISION AREDS 2 PO) Take 1 tablet by mouth 2 (two) times daily.  . nitroGLYCERIN (NITROSTAT) 0.4 MG SL tablet Place 1 tablet (0.4 mg total) under the tongue every 5 (five) minutes as needed.  For chest pain  . traMADol (ULTRAM) 50 MG tablet Take 1 tablet (50 mg total) by mouth every 6 (six) hours as needed.   No facility-administered encounter medications on file as of 11/07/2018.

## 2018-11-07 ENCOUNTER — Encounter: Payer: Self-pay | Admitting: Family Medicine

## 2018-11-07 ENCOUNTER — Other Ambulatory Visit: Payer: Self-pay

## 2018-11-07 ENCOUNTER — Ambulatory Visit (INDEPENDENT_AMBULATORY_CARE_PROVIDER_SITE_OTHER): Payer: Medicare Other | Admitting: Family Medicine

## 2018-11-07 VITALS — BP 130/66 | HR 73 | Temp 98.3°F | Ht 68.0 in | Wt 202.5 lb

## 2018-11-07 DIAGNOSIS — E669 Obesity, unspecified: Secondary | ICD-10-CM | POA: Diagnosis not present

## 2018-11-07 DIAGNOSIS — R29898 Other symptoms and signs involving the musculoskeletal system: Secondary | ICD-10-CM

## 2018-11-07 DIAGNOSIS — I6523 Occlusion and stenosis of bilateral carotid arteries: Secondary | ICD-10-CM

## 2018-11-07 DIAGNOSIS — R198 Other specified symptoms and signs involving the digestive system and abdomen: Secondary | ICD-10-CM | POA: Diagnosis not present

## 2018-11-07 DIAGNOSIS — M7062 Trochanteric bursitis, left hip: Secondary | ICD-10-CM | POA: Diagnosis not present

## 2018-11-07 MED ORDER — METHYLPREDNISOLONE ACETATE 40 MG/ML IJ SUSP
80.0000 mg | Freq: Once | INTRAMUSCULAR | Status: AC
  Administered 2018-11-07: 80 mg via INTRA_ARTICULAR

## 2018-11-07 NOTE — Addendum Note (Signed)
Addended by: Damita Lack on: 11/07/2018 09:13 AM   Modules accepted: Orders

## 2018-11-07 NOTE — Patient Instructions (Signed)
Work on your strengthening ideally 3-4 times a week.

## 2018-11-20 ENCOUNTER — Telehealth: Payer: Self-pay | Admitting: *Deleted

## 2018-11-20 NOTE — Telephone Encounter (Signed)
If not swollen or warm, and if no significant bruising/bleeding o/w, then I wouldn't do anything else about it right now.  I routed this to Dr. Patsy Lager for input, mainly since he isn't improved in the meantime.  I would like his input.    If other bruising or bleeding then let me know so we can see about getting patient evaluated.   Thanks.

## 2018-11-20 NOTE — Telephone Encounter (Signed)
I thank all involved. 

## 2018-11-20 NOTE — Telephone Encounter (Signed)
To help with confusion with multiple MD responding to question, I will send to Mrs. Thomasena Edis and Mrs. Laws - unclear who is working with Dr. Para March:  I remember him well, needle placement was without any difficulty - almost everyone has decreased pain even for a short time.  When people are on blood thinners, there can be additional bleeding whenever there is any procedure or deep injection.  If he had more pain after injection and more bruising later, then I suspect that he had a hematoma or more blood collecting in a pocket after injection.  This is uncommon but can happen with people on blood thinners.   It should resolve with time, but can persist longer on blood thinners.  I would recommend ice and tylenol for pain.

## 2018-11-20 NOTE — Telephone Encounter (Signed)
Left message on voicemail for patient to call back. 

## 2018-11-20 NOTE — Telephone Encounter (Signed)
Patient notified of Dr. Cyndie Chime and Dr. Lianne Bushy responses and verbalized understanding.

## 2018-11-20 NOTE — Telephone Encounter (Signed)
Patient called stating that he was given an injection in his left hip a couple of weeks ago by Dr. Patsy Lager. Patient stated that the area where the injection was given has been painful since the injection. Patient stated  that he noticed this morning on his hip a large bruise that is about 6-8 inches in diameter and another one below it about 3-4 inches. Patient stated that the area is not swollen or warm to the touch. Patient stated that usually when he gets these injections the pain in his hip improves, but not this time. Patient started that the only reason he is concern is because he is on blood thinner. Patient stated that his wife has been sick with bronchitis and her doctor sent her to be tested for Covid-19 yesterday.

## 2018-12-05 ENCOUNTER — Ambulatory Visit (INDEPENDENT_AMBULATORY_CARE_PROVIDER_SITE_OTHER): Payer: Medicare Other | Admitting: Family Medicine

## 2018-12-05 ENCOUNTER — Other Ambulatory Visit: Payer: Self-pay

## 2018-12-05 ENCOUNTER — Encounter: Payer: Self-pay | Admitting: Family Medicine

## 2018-12-05 VITALS — BP 130/82 | HR 71 | Temp 98.4°F | Ht 68.0 in | Wt 201.8 lb

## 2018-12-05 DIAGNOSIS — S7002XA Contusion of left hip, initial encounter: Secondary | ICD-10-CM

## 2018-12-05 DIAGNOSIS — I6523 Occlusion and stenosis of bilateral carotid arteries: Secondary | ICD-10-CM

## 2018-12-05 MED ORDER — TRAMADOL HCL 50 MG PO TABS: 50.0000 mg | ORAL_TABLET | Freq: Four times a day (QID) | ORAL | 0 refills | Status: DC | PRN

## 2018-12-05 NOTE — Patient Instructions (Signed)
Hip Rehab:  Hip Flexion: Toe up to ceiling, laying on your back. Lift your whole leg, 3 sets. Work up to being able to do #30 with each set.  Hip Abductions: Lying on side, straight out to side. 3 sets, work up to being able to do #30 with each set.  At the beginning you may only be able to do a lot less, try to do #10.  

## 2018-12-05 NOTE — Progress Notes (Signed)
Brett Erck T. Blanka Rockholt, MD Primary Care and [place] at [facility] [place], [address] Phone: [phone]  FAX: [phone]  [name] - 73 y.o. male  MRN [id number]  Date of Birth: 1945/12/09  Visit Date: 12/05/2018  PCP: Tonia Ghent, MD  Referred by: Tonia Ghent, MD  Chief Complaint  Patient presents with  . Hip Pain    Left-has gotten worse since injection   Subjective:   [name] is a 73 y.o. very pleasant male patient who presents with the following:  Left sided GTB - a1c is 8.4 in 07/2018  He had some extensive bruising after the last injection.  He had called me about 2 weeks ago with some increased bruising and what he described as a football sized swelling on the lateral aspect of his leg.  The patient is on Brilinta.  From what he tells me he had pain right after his injection he had a palpable knot laterally, but this worsened 1 to 2 weeks after his injection.  He did have some pain in this area even right afterwards.  His hips have minimal fat.  He still is having some pain in the region, 2 weeks after his recent called to me, and is having some pain in the posterior pelvis as well now.  Past Medical History, Surgical History, Social History, Family History, Problem List, Medications, and Allergies have been reviewed and updated if relevant.  Patient Active Problem List   Diagnosis Date Noted  . Carotid artery stenosis without cerebral infarction, left 09/21/2018  . Abnormal dreams 08/05/2018  . Creatinine elevation 08/05/2018  . Dysuria 01/14/2018  . CAD (coronary artery disease), native coronary artery 07/16/2017  . Shoulder pain 12/30/2016  . Skin lesion 12/30/2016  . Unstable angina ([place]) 09/03/2016  . AAA (abdominal aortic aneurysm) without rupture ([place]) 07/13/2016  . Popliteal artery aneurysm ([place]) 07/13/2016  . Carotid artery disease ([place]) 01/30/2015  . NSTEMI (non-ST  elevated myocardial infarction) ([place]) 11/16/2014  . Medicare annual wellness visit, initial 10/21/2014  . Advance care planning 10/21/2014  . PAD (peripheral artery disease) ([place]) 09/09/2014  . COPD exacerbation ([place]) 08/21/2014  . DM (diabetes mellitus), type 2 with peripheral vascular complications ([place]) [id number]  . Back pain 02/24/2013  . Cough 01/26/2011  . COPD (chronic obstructive pulmonary disease) ([place]) 12/26/2010  . FATIGUE 05/11/2009  . Essential hypertension 05/08/2009  . Hyperlipidemia 06/20/2008    Past Medical History:  Diagnosis Date  . CAD (coronary artery disease)    a. 1999: PCI-->LAD 2/2 MI; b. 2005: inf MI s/p PCI/DES x 4 to RCA; c. Myoview in 12/2005: EF 61%, no evidence for ischemia; d. cath 10/2014: occluded mLAD w/ L to L and L to R collats, dRCA 95% s/p PCI/DES 0%, mPDA 70%, LCx mild to mod irregs, procedure complicated by inf ST ele in recovery, repeat cath showed acute dRCA stent thrombosis o/w occluded mRPDA, PTCA dRCA, PCI/DES RPDA, aggrastat x 18 hr  . CKD (chronic kidney disease)   . COPD (chronic obstructive pulmonary disease) ([place])   . DM2 (diabetes mellitus, type 2) ([place])   . H/O hiatal hernia   . Heart attack ([place]) [phone]   had 2 in one day(released plaque during angio). 5 stents total  . HTN (hypertension)   . Hypercholesterolemia   . Impaired fasting glucose    Elevated after steroid injection  . Macular degeneration of right eye 2020   receiving  injections into eye  . Osteoarthritis of hip    injections in both hips  . Osteoarthritis, knee   . PAD (peripheral artery disease) ([facility])    L fem to below the knee popliteal vein bypass graft  . Peri-rectal abscess 12/27/2011  . Tobacco abuse    Prior    Past Surgical History:  Procedure Laterality Date  . CORONARY ANGIOPLASTY    . [place], 2006, 2016 x 2   Multiple, LAD in 1999, RCA in 2005.  has a total of 4 stents  . ENDARTERECTOMY Left 09/21/2018   Procedure:  ENDARTERECTOMY CAROTID;  Surgeon: Katha Cabal, MD;  Location: [facility] ORS;  Service: Vascular;  Laterality: Left;  . FEMORAL-POPLITEAL BYPASS GRAFT  2016   L fem to below the knee popliteal vein bypass graft  . INCISE AND DRAIN ABCESS  2007   on scrotum  . INCISION AND DRAINAGE PERIRECTAL ABSCESS  12/28/2011   Procedure: IRRIGATION AND DEBRIDEMENT PERIRECTAL ABSCESS;  Surgeon: Merrie Roof, MD;  Location: [facility];  Service: General;  Laterality: N/A;    Social History   Socioeconomic History  . Marital status: Married    Spouse name: wanda  . Number of children: 2  . Years of education: Not on file  . Highest education level: Not on file  Occupational History  . 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 Financial resource strain: Not on file  . Food insecurity:    Worry: Not on file    Inability: Not on file  . Transportation needs:    Medical: Not on file    Non-medical: Not on file  Tobacco Use  . Smoking status: Former Smoker    Packs/day: 1.00    Years: 40.00    Pack years: 40.00    Types: Cigarettes    Last attempt to quit: 10/21/1998    Years since quitting: 20.1  . Smokeless tobacco: Never Used  . Tobacco comment: Past smoker, states he will smoke maybe 1 cigarette/ month or so still  Substance and Sexual Activity  . Alcohol use: Yes    Alcohol/week: 0.0 standard drinks    Comment: Occasional beer on the weekends  . Drug use: No  . Sexual activity: Never  Lifestyle  . Physical activity:    Days per week: Not on file    Minutes per session: Not on file  . Stress: Not on file  Relationships  . Social connections:    Talks on phone: Not on file    Gets together: Not on file    Attends religious service: Not on file    Active member of club or organization: Not on file    Attends meetings of clubs or organizations: Not on file    Relationship status: Not on file  . Intimate partner violence:     Fear of current or ex partner: Not on file    Emotionally abused: Not on file    Physically abused: Not on file    Forced sexual activity: Not on file  Other Topics Concern  . Not on file  Social History Narrative   Lives in [place]   Married 50+ years   2 grown daughters, 7 grandchildren, 4 great grandchildren   Designated Party Release Form signed on 01/19/10 appointing Gerhard Munch.     Family History  Problem Relation Age of Onset  . Heart failure Father  CHF  . CAD Mother   . Diabetes Mother   . Colon cancer Neg Hx   . Prostate cancer Neg Hx     Allergies  Allergen Reactions  . Augmentin [Amoxicillin-Pot Clavulanate] Other (See Comments)    Nausea,vomiting,diarrhea  . Morphine And Related Nausea And Vomiting    Vomiting, GI upset    Medication list reviewed and updated in full in [place].  GEN: No fevers, chills. Nontoxic. Primarily MSK c/o today. MSK: Detailed in the HPI GI: tolerating PO intake without difficulty Neuro: No numbness, parasthesias, or tingling associated. Otherwise the pertinent positives of the ROS are noted above.   Objective:   Pulse 71   Temp 98.4 F (36.9 C) (Oral)   Ht 5' 8"  (1.727 m)   Wt 201 lb 12 oz (91.5 kg)   BMI 30.68 kg/m    GEN: WDWN, NAD, Non-toxic, Alert & Oriented x 3 HEENT: Atraumatic, Normocephalic.  Ears and Nose: No external deformity. EXTR: No clubbing/cyanosis/edema NEURO: Normal gait.  PSYCH: Normally interactive. Conversant. Not depressed or anxious appearing.  Calm demeanor.    Entire left hip has full range of motion with no pain with terminal motion. GTB easily palpable approx depth of 1 1/2 cm  Does have some mild tenderness along the pelvic rim on the left side.  Some modest tenderness with resisted flexion as well as hip abduction, but strength remains 4+/5.  He does have a relatively large easily palpable hematoma on the anterolateral hip and in the proximal quadricep.  There is no  visible ecchymosis at this point. Size is approx 20 cm. This is the area of tenderness.  Radiology: No results found.  Assessment and Plan:   Hematoma of left hip, initial encounter  Initial small intramuscular hematoma immediately after injection followed by exacerbation in a patient with secondary large hematoma on Brilinta.  Secondary mild gluteus medius tendinopathy as well.  No concern for other pathology.  This is a known risk albeit low, and risk for bleeding and hematoma is increased and Brilinta compared to other antiplatelet medications.  Reassurance was provided, and not realistic to think this will improve in 2 weeks, and I would expect 6 to 8 weeks for resolution.  The patient's questions and his wife's questions were answered.  Continue ice, Tylenol, tramadol as needed, but not before work.  Follow-up: prn only  Meds ordered this encounter  Medications  . traMADol (ULTRAM) 50 MG tablet    Sig: Take 1 tablet (50 mg total) by mouth every 6 (six) hours as needed.    Dispense:  30 tablet    Refill:  0   No orders of the defined types were placed in this encounter.   Signed,  Maud Deed. Marrianne Sica, MD   Outpatient Encounter Medications as of 12/05/2018  Medication Sig  . acetaminophen (TYLENOL) 325 MG tablet Take 1-2 tablets (325-650 mg total) by mouth every 4 (four) hours as needed for mild pain (or temp >/= 101 F).  Marland Kitchen albuterol (PROAIR HFA) 108 (90 BASE) MCG/ACT inhaler Inhale 2 puffs into the lungs 3 (three) times daily as needed (cough).  Marland Kitchen aspirin 81 MG EC tablet Take 1 tablet (81 mg total) by mouth daily.  Marland Kitchen atorvastatin (LIPITOR) 40 MG tablet TAKE 1 TABLET(40 MG) BY MOUTH DAILY  . blood glucose meter kit and supplies KIT 1 each by Does not apply route daily as needed. Dispense based on patient and insurance preference. Use up to four times daily as directed. (  FOR ICD-9 250.00, 250.01).  . BRILINTA 60 MG TABS tablet TAKE 1 TABLET BY MOUTH TWICE DAILY  .  fenofibrate (TRICOR) 145 MG tablet TAKE 1 TABLET BY MOUTH DAILY  . Fluticasone-Salmeterol (ADVAIR DISKUS) 250-50 MCG/DOSE AEPB Inhale 1 puff into the lungs 2 (two) times daily.  Marland Kitchen glipiZIDE (GLUCOTROL) 5 MG tablet Take 1 tablet (5 mg total) by mouth 2 (two) times daily before a meal.  . glucosamine-chondroitin 500-400 MG tablet Take 1 tablet by mouth 2 (two) times daily.  . hydrochlorothiazide (HYDRODIURIL) 25 MG tablet TAKE 1 TABLET BY MOUTH EVERY DAY  . Insulin Glargine (LANTUS SOLOSTAR) 100 UNIT/ML Solostar Pen Inject up to 35 units per day  . Insulin Pen Needle (B-D UF III MINI PEN NEEDLES) 31G X 5 MM MISC Use as instructed to inject insulin.  Diagnosis:  250.00  Insulin dependent.  Marland Kitchen lisinopril (PRINIVIL,ZESTRIL) 40 MG tablet Take 1 tablet (40 mg total) by mouth daily.  . meclizine (ANTIVERT) 25 MG tablet Take 25 mg by mouth every 6 (six) hours as needed. For dizziness/vertigo  . metFORMIN (GLUCOPHAGE) 500 MG tablet TAKE 2 TABLETS BY MOUTH TWICE A DAY WITH A MEAL  . metoprolol tartrate (LOPRESSOR) 25 MG tablet TAKE 1/2 TABLET BY MOUTH TWICE DAILY  . Multiple Vitamins-Minerals (PRESERVISION AREDS 2 PO) Take 1 tablet by mouth 2 (two) times daily.  . nitroGLYCERIN (NITROSTAT) 0.4 MG SL tablet Place 1 tablet (0.4 mg total) under the tongue every 5 (five) minutes as needed. For chest pain  . traMADol (ULTRAM) 50 MG tablet Take 1 tablet (50 mg total) by mouth every 6 (six) hours as needed.  . [DISCONTINUED] traMADol (ULTRAM) 50 MG tablet Take 1 tablet (50 mg total) by mouth every 6 (six) hours as needed.   No facility-administered encounter medications on file as of 12/05/2018.

## 2018-12-06 ENCOUNTER — Encounter: Payer: Self-pay | Admitting: Family Medicine

## 2018-12-06 ENCOUNTER — Ambulatory Visit: Payer: Medicare Other | Admitting: Family Medicine

## 2018-12-24 ENCOUNTER — Other Ambulatory Visit (INDEPENDENT_AMBULATORY_CARE_PROVIDER_SITE_OTHER): Payer: Self-pay | Admitting: Nurse Practitioner

## 2018-12-24 DIAGNOSIS — I6523 Occlusion and stenosis of bilateral carotid arteries: Secondary | ICD-10-CM

## 2018-12-24 DIAGNOSIS — Z9889 Other specified postprocedural states: Secondary | ICD-10-CM

## 2018-12-25 DIAGNOSIS — H353211 Exudative age-related macular degeneration, right eye, with active choroidal neovascularization: Secondary | ICD-10-CM | POA: Diagnosis not present

## 2018-12-31 ENCOUNTER — Ambulatory Visit (INDEPENDENT_AMBULATORY_CARE_PROVIDER_SITE_OTHER): Payer: Medicare Other | Admitting: Vascular Surgery

## 2018-12-31 ENCOUNTER — Other Ambulatory Visit: Payer: Self-pay

## 2018-12-31 ENCOUNTER — Encounter (INDEPENDENT_AMBULATORY_CARE_PROVIDER_SITE_OTHER): Payer: Self-pay | Admitting: Vascular Surgery

## 2018-12-31 ENCOUNTER — Ambulatory Visit (INDEPENDENT_AMBULATORY_CARE_PROVIDER_SITE_OTHER): Payer: Medicare Other

## 2018-12-31 VITALS — BP 100/62 | HR 73 | Resp 14 | Ht 68.0 in | Wt 201.0 lb

## 2018-12-31 DIAGNOSIS — I739 Peripheral vascular disease, unspecified: Secondary | ICD-10-CM | POA: Diagnosis not present

## 2018-12-31 DIAGNOSIS — Z7902 Long term (current) use of antithrombotics/antiplatelets: Secondary | ICD-10-CM | POA: Diagnosis not present

## 2018-12-31 DIAGNOSIS — Z9889 Other specified postprocedural states: Secondary | ICD-10-CM

## 2018-12-31 DIAGNOSIS — I724 Aneurysm of artery of lower extremity: Secondary | ICD-10-CM | POA: Diagnosis not present

## 2018-12-31 DIAGNOSIS — J432 Centrilobular emphysema: Secondary | ICD-10-CM

## 2018-12-31 DIAGNOSIS — I6523 Occlusion and stenosis of bilateral carotid arteries: Secondary | ICD-10-CM

## 2018-12-31 DIAGNOSIS — I10 Essential (primary) hypertension: Secondary | ICD-10-CM | POA: Diagnosis not present

## 2018-12-31 DIAGNOSIS — I714 Abdominal aortic aneurysm, without rupture, unspecified: Secondary | ICD-10-CM

## 2018-12-31 DIAGNOSIS — Z79899 Other long term (current) drug therapy: Secondary | ICD-10-CM

## 2018-12-31 DIAGNOSIS — Z7982 Long term (current) use of aspirin: Secondary | ICD-10-CM | POA: Diagnosis not present

## 2018-12-31 DIAGNOSIS — Z87891 Personal history of nicotine dependence: Secondary | ICD-10-CM

## 2019-01-06 ENCOUNTER — Encounter (INDEPENDENT_AMBULATORY_CARE_PROVIDER_SITE_OTHER): Payer: Self-pay | Admitting: Vascular Surgery

## 2019-01-06 NOTE — Progress Notes (Signed)
MRN : [id number]  [name] is a 73 y.o. ([birth year]) male who presents with chief complaint of  Chief Complaint  Patient presents with  . Follow-up  .  History of Present Illness: The patient is seen for follow up evaluation of carotid stenosis. The carotid stenosis followed by ultrasound.   The patient denies amaurosis fugax. There is no recent history of TIA symptoms or focal motor deficits. There is no prior documented CVA.  The patient is taking enteric-coated aspirin 81 mg daily.  There is no history of migraine headaches. There is no history of seizures.  The patient has a history of coronary artery disease, no recent episodes of angina or shortness of breath. The patient denies PAD or claudication symptoms. There is a history of hyperlipidemia which is being treated with a statin.    Carotid Duplex done today shows RICA <87% and LICA 56-43%.  No change compared to last study in 07/09/2018.  Current Meds  Medication Sig  . acetaminophen (TYLENOL) 325 MG tablet Take 1-2 tablets (325-650 mg total) by mouth every 4 (four) hours as needed for mild pain (or temp >/= 101 F).  Marland Kitchen albuterol (PROAIR HFA) 108 (90 BASE) MCG/ACT inhaler Inhale 2 puffs into the lungs 3 (three) times daily as needed (cough).  Marland Kitchen aspirin 81 MG EC tablet Take 1 tablet (81 mg total) by mouth daily.  Marland Kitchen atorvastatin (LIPITOR) 40 MG tablet TAKE 1 TABLET(40 MG) BY MOUTH DAILY  . BRILINTA 60 MG TABS tablet TAKE 1 TABLET BY MOUTH TWICE DAILY  . fenofibrate (TRICOR) 145 MG tablet TAKE 1 TABLET BY MOUTH DAILY  . Fluticasone-Salmeterol (ADVAIR DISKUS) 250-50 MCG/DOSE AEPB Inhale 1 puff into the lungs 2 (two) times daily.  Marland Kitchen glipiZIDE (GLUCOTROL) 5 MG tablet Take 1 tablet (5 mg total) by mouth 2 (two) times daily before a meal.  . glucosamine-chondroitin 500-400 MG tablet Take 1 tablet by mouth 2 (two) times daily.  . hydrochlorothiazide (HYDRODIURIL) 25 MG tablet TAKE 1 TABLET BY MOUTH EVERY DAY  . Insulin  Glargine (LANTUS SOLOSTAR) 100 UNIT/ML Solostar Pen Inject up to 35 units per day  . lisinopril (PRINIVIL,ZESTRIL) 40 MG tablet Take 1 tablet (40 mg total) by mouth daily.  . meclizine (ANTIVERT) 25 MG tablet Take 25 mg by mouth every 6 (six) hours as needed. For dizziness/vertigo  . metFORMIN (GLUCOPHAGE) 500 MG tablet TAKE 2 TABLETS BY MOUTH TWICE A DAY WITH A MEAL  . metoprolol tartrate (LOPRESSOR) 25 MG tablet TAKE 1/2 TABLET BY MOUTH TWICE DAILY  . Multiple Vitamins-Minerals (PRESERVISION AREDS 2 PO) Take 1 tablet by mouth 2 (two) times daily.  . nitroGLYCERIN (NITROSTAT) 0.4 MG SL tablet Place 1 tablet (0.4 mg total) under the tongue every 5 (five) minutes as needed. For chest pain  . traMADol (ULTRAM) 50 MG tablet Take 1 tablet (50 mg total) by mouth every 6 (six) hours as needed.    Past Medical History:  Diagnosis Date  . CAD (coronary artery disease)    a. 1999: PCI-->LAD 2/2 MI; b. 2005: inf MI s/p PCI/DES x 4 to RCA; c. Myoview in 12/2005: EF 61%, no evidence for ischemia; d. cath 10/2014: occluded mLAD w/ L to L and L to R collats, dRCA 95% s/p PCI/DES 0%, mPDA 70%, LCx mild to mod irregs, procedure complicated by inf ST ele in recovery, repeat cath showed acute dRCA stent thrombosis o/w occluded mRPDA, PTCA dRCA, PCI/DES RPDA, aggrastat x 18 hr  . CKD (chronic kidney  disease)   . COPD (chronic obstructive pulmonary disease) (HCC)   . DM2 (diabetes mellitus, type 2) (HCC)   . H/O hiatal hernia   . Heart attack (HCC) [phone]   had 2 in one day(released plaque during angio). 5 stents total  . HTN (hypertension)   . Hypercholesterolemia   . Impaired fasting glucose    Elevated after steroid injection  . Macular degeneration of right eye 2020   receiving injections into eye  . Osteoarthritis of hip    injections in both hips  . Osteoarthritis, knee   . PAD (peripheral artery disease) (HCC)    L fem to below the knee popliteal vein bypass graft  . Peri-rectal abscess  12/27/2011  . Tobacco abuse    Prior    Past Surgical History:  Procedure Laterality Date  . CORONARY ANGIOPLASTY    . CORONARY STENT PLACEMENT  1999, 2006, 2016 x 2   Multiple, LAD in 1999, RCA in 2005.  has a total of 4 stents  . ENDARTERECTOMY Left 09/21/2018   Procedure: ENDARTERECTOMY CAROTID;  Surgeon: Renford Dills, MD;  Location: [facility] ORS;  Service: Vascular;  Laterality: Left;  . FEMORAL-POPLITEAL BYPASS GRAFT  2016   L fem to below the knee popliteal vein bypass graft  . INCISE AND DRAIN ABCESS  2007   on scrotum  . INCISION AND DRAINAGE PERIRECTAL ABSCESS  12/28/2011   Procedure: IRRIGATION AND DEBRIDEMENT PERIRECTAL ABSCESS;  Surgeon: Robyne Askew, MD;  Location: [facility] OR;  Service: General;  Laterality: N/A;    Social History Social History   Tobacco Use  . Smoking status: Former Smoker    Packs/day: 1.00    Years: 40.00    Pack years: 40.00    Types: Cigarettes    Quit date: 10/21/1998    Years since quitting: 20.2  . Smokeless tobacco: Never Used  . Tobacco comment: Past smoker, states he will smoke maybe 1 cigarette/ month or so still  Substance Use Topics  . Alcohol use: Yes    Alcohol/week: 0.0 standard drinks    Comment: Occasional beer on the weekends  . Drug use: No    Family History Family History  Problem Relation Age of Onset  . Heart failure Father        CHF  . CAD Mother   . Diabetes Mother   . Colon cancer Neg Hx   . Prostate cancer Neg Hx   No family history of bleeding/clotting disorders, porphyria or autoimmune disease   Allergies  Allergen Reactions  . Augmentin [Amoxicillin-Pot Clavulanate] Other (See Comments)    Nausea,vomiting,diarrhea  . Morphine And Related Nausea And Vomiting    Vomiting, GI upset     REVIEW OF SYSTEMS (Negative unless checked)  Constitutional: Weight loss  Fever  Chills Cardiac: Chest pain   Chest pressure   Palpitations   Shortness of breath when laying flat   Shortness of breath  with exertion. Vascular:  Pain in legs with walking   Pain in legs at rest  History of DVT   Phlebitis   Swelling in legs   Varicose veins   Non-healing ulcers Pulmonary:   Uses home oxygen   Productive cough   Hemoptysis   Wheeze  COPD   Asthma Neurologic:  Dizziness   Seizures   History of stroke   History of TIA  Aphasia   Vissual changes   Weakness or numbness in arm   Weakness or numbness in leg Musculoskeletal:     Joint swelling   [] Joint pain   [] Low back pain Hematologic:  [] Easy bruising  [] Easy bleeding   [] Hypercoagulable state   [] Anemic Gastrointestinal:  [] Diarrhea   [] Vomiting  [] Gastroesophageal reflux/heartburn   [] Difficulty swallowing. Genitourinary:  [] Chronic kidney disease   [] Difficult urination  [] Frequent urination   [] Blood in urine Skin:  [] Rashes   [] Ulcers  Psychological:  [] History of anxiety   []  History of major depression.  Physical Examination  Vitals:   12/31/18 1532  BP: 100/62  Pulse: 73  Resp: 14  Weight: 201 lb (91.2 kg)  Height: 5\' 8"  (1.727 m)   Body mass index is 30.56 kg/m. Gen: WD/WN, NAD Head: Anchor/AT, No temporalis wasting.  Ear/Nose/Throat: Hearing grossly intact, nares w/o erythema or drainage, poor dentition Eyes: PER, EOMI, sclera nonicteric.  Neck: Supple, no masses.  No bruit or JVD.  Pulmonary:  Good air movement, clear to auscultation bilaterally, no use of accessory muscles.  Cardiac: RRR, normal S1, S2, no Murmurs. Vascular: bilateral carotid bruit Vessel Right Left  Radial Palpable Palpable  Brachial Palpable Palpable  Carotid Palpable Palpable  Femoral Palpable Palpable  Popliteal Not Palpable Not Palpable  PT Not Palpable Not Palpable  DP Not Palpable Not Palpable   Gastrointestinal: soft, non-distended. No guarding/no peritoneal signs.  Musculoskeletal: M/S 5/5 throughout.  No deformity or atrophy.  Neurologic: CN 2-12 intact. Pain and light touch intact in extremities.   Symmetrical.  Speech is fluent. Motor exam as listed above. Psychiatric: Judgment intact, Mood & affect appropriate for pt's clinical situation. Dermatologic: No rashes or ulcers noted.  No changes consistent with cellulitis. Lymph : No Cervical lymphadenopathy, no lichenification or skin changes of chronic lymphedema.  CBC Lab Results  Component Value Date   WBC 12.9 (H) 09/22/2018   HGB 12.4 (L) 09/22/2018   HCT 37.5 (L) 09/22/2018   MCV 93.5 09/22/2018   PLT 177 09/22/2018    BMET    Component Value Date/Time   NA 139 09/22/2018 0400   NA 134 (L) 10/29/2014 0908   K 4.6 09/22/2018 0400   K 4.3 10/29/2014 0908   CL 108 09/22/2018 0400   CL 103 10/29/2014 0908   CO2 21 (L) 09/22/2018 0400   CO2 24 10/29/2014 0908   GLUCOSE 232 (H) 09/22/2018 0400   GLUCOSE 142 (H) 10/29/2014 0908   BUN 34 (H) 09/22/2018 0400   BUN 20 10/29/2014 0908   CREATININE 1.64 (H) 09/22/2018 0400   CREATININE 1.32 (H) 10/29/2014 0908   CREATININE 0.96 01/28/2011 0855   CALCIUM 8.1 (L) 09/22/2018 0400   CALCIUM 9.3 10/29/2014 0908   GFRNONAA 41 (L) 09/22/2018 0400   GFRNONAA 55 (L) 10/29/2014 0908   GFRAA 47 (L) 09/22/2018 0400   GFRAA >60 10/29/2014 0908   CrCl cannot be calculated (Patient's most recent lab result is older than the maximum 21 days allowed.).  COAG Lab Results  Component Value Date   INR 1.0 09/14/2018   INR 0.9 10/24/2014   INR 1.07 12/28/2011    Radiology [facility] [place] Carotid  Result Date: 12/31/2018 Carotid Arterial Duplex Study Indications:       Endarterectomy and CVD                    09/21/2018 Left CEA. Comparison Study:  07/09/2018 Performing Technologist: Salvadore Farbererry Knight RVT  Examination Guidelines: A complete evaluation includes B-mode imaging, spectral Doppler, color Doppler, and power Doppler as needed of all accessible portions of each vessel. Bilateral testing is considered  an integral part of a complete examination. Limited examinations for reoccurring indications  may be performed as noted.  Right Carotid Findings: +----------+--------+--------+--------+--------+--------+           PSV cm/sEDV cm/sStenosisDescribeComments +----------+--------+--------+--------+--------+--------+ CCA Prox  57      16                               +----------+--------+--------+--------+--------+--------+ CCA Mid   71      17                               +----------+--------+--------+--------+--------+--------+ CCA Distal78      20                               +----------+--------+--------+--------+--------+--------+ ICA Prox  62      23      1-39%                    +----------+--------+--------+--------+--------+--------+ ICA Mid   54      21                               +----------+--------+--------+--------+--------+--------+ ICA Distal63      19                               +----------+--------+--------+--------+--------+--------+ ECA       151     8                                +----------+--------+--------+--------+--------+--------+ +----------+--------+-------+----------------+-------------------+           PSV cm/sEDV cmsDescribe        Arm Pressure (mmHG) +----------+--------+-------+----------------+-------------------+ Subclavian119            Multiphasic, WNL                    +----------+--------+-------+----------------+-------------------+ +---------+--------+--+--------+---------+ VertebralPSV cm/s28EDV cm/sAntegrade +---------+--------+--+--------+---------+ ICA/CCA ratio = .87  Left Carotid Findings: +----------+--------+--------+--------+--------+-------------------------------+           PSV cm/sEDV cm/sStenosisDescribeComments                        +----------+--------+--------+--------+--------+-------------------------------+ CCA Prox  79      15                                                       +----------+--------+--------+--------+--------+-------------------------------+ CCA Mid   70      22                                                      +----------+--------+--------+--------+--------+-------------------------------+ CCA Distal103     20                                                      +----------+--------+--------+--------+--------+-------------------------------+  ICA Prox  71      19                                                      +----------+--------+--------+--------+--------+-------------------------------+ ICA Mid   147     46      40-59%          Post op tortuousity and                                                   narrowing mid ICA               +----------+--------+--------+--------+--------+-------------------------------+ ICA Distal120     31                                                      +----------+--------+--------+--------+--------+-------------------------------+ ECA       93      6                                                       +----------+--------+--------+--------+--------+-------------------------------+ +----------+--------+--------+--------+-------------------+ SubclavianPSV cm/sEDV cm/sDescribeArm Pressure (mmHG) +----------+--------+--------+--------+-------------------+           114                                         +----------+--------+--------+--------+-------------------+ +---------+--------+--+--------+ VertebralPSV cm/s33EDV cm/s +---------+--------+--+--------+ ICA/CCA ratio = 2.1  Summary: Right Carotid: Velocities in the right ICA are consistent with a 1-39% stenosis.                Minimal heterogeneous plaque in the bulb. Left Carotid: Velocities in the left ICA are consistent with a 40-59% stenosis.               Moderate post op narrowing and tortuosity in the mid ICA. Vertebrals:  Bilateral vertebral arteries demonstrate antegrade flow. Subclavians: Normal flow  hemodynamics were seen in bilateral subclavian              arteries. *See table(s) above for measurements and observations.  Electronically signed by Levora DredgeGregory Cyenna Rebello MD on 12/31/2018 at 4:25:59 PM.    Final      Assessment/Plan 1. Bilateral carotid artery stenosis Recommend:  Given the patient's asymptomatic subcritical stenosis no further invasive testing or surgery at this time.  Duplex ultrasound shows RICA <40% and LICA 40-59%.  Continue antiplatelet therapy as prescribed Continue management of CAD, HTN and Hyperlipidemia Healthy heart diet,  encouraged exercise at least 4 times per week Follow up in 6 months with duplex ultrasound and physical exam  - VAS US CAROTID; Future  2. AAA (abdominal aortic aneurysm) without rupture (HCC) No surgery or intervention at this time. The patient has an asymptomatic abdominal aortic aneurysm that is greater than 4 cm but less than 5  cm in maximal diameter.  I have discussed the natural history of abdominal aortic aneurysm and the small risk of rupture for aneurysm less than 5 cm in size.  However, as these small aneurysms tend to enlarge over time, continued surveillance with ultrasound or CT scan is mandatory.  I have also discussed optimizing medical management with hypertension and lipid control and the importance of abstinence from tobacco.  The patient is also encouraged to exercise a minimum of 30 minutes 4 times a week.  Should the patient develop new onset abdominal or back pain or signs of peripheral embolization they are instructed to seek medical attention immediately and to alert the physician providing care that they have an aneurysm.  The patient voices their understanding. I have scheduled the patient to return in 6 months with an aortic duplex.  - VAS US AORTA/IVC/ILIACS; Future  3. Popliteal artery aneurysm (HCC) No surgery or intervention at this time.  The patient has an asymptomatic popliteal artery aneurysm that is less  than 2.5 cm in maximal diameter.  I have discussed the natural history of popliteal aneurysm and the small risk of thrombosis for aneurysm less than 2.5 cm in size.  However, as these small aneurysms tend to enlarge over time, continued surveillance with ultrasound is mandatory.   I have also discussed optimizing medical management with hypertension and lipid control and the importance of abstinence from tobacco.  The patient is also encouraged to exercise a minimum of 30 minutes 4 times a week.   Should the patient develop new leg pain or signs of peripheral embolization they are instructed to seek medical attention immediately and to alert the physician providing care that they have an aneurysm.  The patient voices their understanding.  - VAS US LOWER EXTREMITY ARTERIAL DUPLEX; Future  4. PAD (peripheral artery disease) (HCC)  Recommend:  The patient has evidence of atherosclerosis of the lower extremities with claudication.  The patient does not voice lifestyle limiting changes at this point in time.  Noninvasive studies do not suggest clinically significant change.  No invasive studies, angiography or surgery at this time The patient should continue walking and begin a more formal exercise program.  The patient should continue antiplatelet therapy and aggressive treatment of the lipid abnormalities  No changes in the patient's medications at this time  The patient should continue wearing graduated compression socks 10-15 mmHg strength to control the mild edema.   - VAS US ABI WITH/WO TBI; Future  5. Essential hypertension Continue antihypertensive medications as already ordered, these medications have been reviewed and there are no changes at this time.   6. Centrilobular emphysema (HCC) Continue pulmonary medications and aerosols as already ordered, these medications have been reviewed and there are no changes at this time.      Levora DredgeGregory Mozelle Remlinger, MD  01/06/2019 4:19 PM

## 2019-01-08 ENCOUNTER — Other Ambulatory Visit: Payer: Self-pay | Admitting: Cardiovascular Disease

## 2019-02-15 DIAGNOSIS — H353211 Exudative age-related macular degeneration, right eye, with active choroidal neovascularization: Secondary | ICD-10-CM | POA: Diagnosis not present

## 2019-02-15 DIAGNOSIS — H43391 Other vitreous opacities, right eye: Secondary | ICD-10-CM | POA: Diagnosis not present

## 2019-02-15 DIAGNOSIS — H43813 Vitreous degeneration, bilateral: Secondary | ICD-10-CM | POA: Diagnosis not present

## 2019-02-15 DIAGNOSIS — H353123 Nonexudative age-related macular degeneration, left eye, advanced atrophic without subfoveal involvement: Secondary | ICD-10-CM | POA: Diagnosis not present

## 2019-04-08 ENCOUNTER — Other Ambulatory Visit: Payer: Self-pay | Admitting: Cardiovascular Disease

## 2019-04-09 NOTE — Telephone Encounter (Signed)
Scheduled 10/7 w/ Rockey Situ

## 2019-04-09 NOTE — Telephone Encounter (Signed)
Pt overdue for 6 month f/u.  Please contact pt for future appointment. 

## 2019-04-12 DIAGNOSIS — H353124 Nonexudative age-related macular degeneration, left eye, advanced atrophic with subfoveal involvement: Secondary | ICD-10-CM | POA: Diagnosis not present

## 2019-04-12 DIAGNOSIS — H43391 Other vitreous opacities, right eye: Secondary | ICD-10-CM | POA: Diagnosis not present

## 2019-04-12 DIAGNOSIS — H353211 Exudative age-related macular degeneration, right eye, with active choroidal neovascularization: Secondary | ICD-10-CM | POA: Diagnosis not present

## 2019-04-12 DIAGNOSIS — H43813 Vitreous degeneration, bilateral: Secondary | ICD-10-CM | POA: Diagnosis not present

## 2019-04-16 NOTE — Progress Notes (Signed)
Virtual Visit via Video Note   This visit type was conducted due to national recommendations for restrictions regarding the COVID-19 Pandemic (e.g. social distancing) in an effort to limit this patient's exposure and mitigate transmission in our community.  Due to his co-morbid illnesses, this patient is at least at moderate risk for complications without adequate follow up.  This format is felt to be most appropriate for this patient at this time.  All issues noted in this document were discussed and addressed.  A limited physical exam was performed with this format.  Please refer to the patient's chart for his consent to telehealth for [facility].   I connected with  [name] on 04/17/19 by a video enabled telemedicine application and verified that I am speaking with the correct person using two identifiers. I discussed the limitations of evaluation and management by telemedicine. The patient expressed understanding and agreed to proceed.   Evaluation Performed:  Follow-up visit  Date:  04/17/2019   ID:  [name], DOB 1946-07-03, MRN [id number]  Patient Location:  [address] [place] [address]   Provider location:   [facility], [place] office  PCP:  Tonia Ghent, MD  Cardiologist:  Patsy Baltimore   Chief Complaint:  CEA/carotid   History of Present Illness:    [name] is a 73 y.o. male who presents via audio/video conferencing for a telehealth visit today.   The patient does not symptoms concerning for COVID-19 infection (fever, chills, cough, or new SHORTNESS OF BREATH).   Patient has a past medical history of coronary artery disease,  diabetes,  PAD,  prior stent to the LAD and RCA,  left femoropopliteal bypass with repair of aneurysm,  long history of smoking for 50 years,  quit non-STEMI 10/24/2014  anginal equivalent, scapular pain between his shoulder blades. Moderate carotid disease on the left who presents  for routine follow-up of his coronary artery disease.   Carotid CEA,09/2018 CR 1.64, BUN 34, no follow up labs HAB1C 8.3  No angina,  No chest pain, SOB  Hip pain Previously with  ice cream every night,  poor diet in general, weigh running high  He does book work for [employer]  In [employer]   previous vascular studies reviewed with him in detail from Dr. Delana Meyer  asymptomatic abdominal aortic aneurysm that is less than 4 cm in maximal diameter.  Duplex ultrasound shows <60% stenosis bilaterally.  Other past medical history   presented to the hospital April 15 with pain similar to his previous anginal equivalent, scapular pain between his shoulder blades. He had cardiac catheterization April 18 that showed chronically occluded mid LAD, severe disease of the distal RCA as well as PDA branch. He had DES to the distal RCA. In the holding area, he developed chest discomfort and was taken back to the cardiac catheterization lab that showed occluded PDA lesion which was stented. Discharged home on 11/01/2014    Prior CV studies:   The following studies were reviewed today:    Past Medical History:  Diagnosis Date  . CAD (coronary artery disease)    a. 1999: PCI-->LAD 2/2 MI; b. 2005: inf MI s/p PCI/DES x 4 to RCA; c. Myoview in 12/2005: EF 61%, no evidence for ischemia; d. cath 10/2014: occluded mLAD w/ L to L and L to R collats, dRCA 95% s/p PCI/DES 0%, mPDA 70%, LCx mild to mod irregs, procedure complicated by inf  ST ele in recovery, repeat cath showed acute dRCA stent thrombosis o/w occluded mRPDA, PTCA dRCA, PCI/DES RPDA, aggrastat x 18 hr  . CKD (chronic kidney disease)   . COPD (chronic obstructive pulmonary disease) ([place])   . DM2 (diabetes mellitus, type 2) ([place])   . H/O hiatal hernia   . Heart attack ([place]) [phone]   had 2 in one day(released plaque during angio). 5 stents total  . HTN (hypertension)   . Hypercholesterolemia   . Impaired fasting  glucose    Elevated after steroid injection  . Macular degeneration of right eye 2020   receiving injections into eye  . Osteoarthritis of hip    injections in both hips  . Osteoarthritis, knee   . PAD (peripheral artery disease) (HCC)    L fem to below the knee popliteal vein bypass graft  . Peri-rectal abscess 12/27/2011  . Tobacco abuse    Prior   Past Surgical History:  Procedure Laterality Date  . CORONARY ANGIOPLASTY    . [place], 2006, 2016 x 2   Multiple, LAD in 1999, RCA in 2005.  has a total of 4 stents  . ENDARTERECTOMY Left 09/21/2018   Procedure: ENDARTERECTOMY CAROTID;  Surgeon: Katha Cabal, MD;  Location: [facility] ORS;  Service: Vascular;  Laterality: Left;  . FEMORAL-POPLITEAL BYPASS GRAFT  2016   L fem to below the knee popliteal vein bypass graft  . INCISE AND DRAIN ABCESS  2007   on scrotum  . INCISION AND DRAINAGE PERIRECTAL ABSCESS  12/28/2011   Procedure: IRRIGATION AND DEBRIDEMENT PERIRECTAL ABSCESS;  Surgeon: Merrie Roof, MD;  Location: [place];  Service: General;  Laterality: N/A;    Allergies:   Augmentin [amoxicillin-pot clavulanate] and Morphine and related   Social History   Tobacco Use  . Smoking status: Former Smoker    Packs/day: 1.00    Years: 40.00    Pack years: 40.00    Types: Cigarettes    Quit date: 10/21/1998    Years since quitting: 20.5  . Smokeless tobacco: Never Used  . Tobacco comment: Past smoker, states he will smoke maybe 1 cigarette/ month or so still  Substance Use Topics  . Alcohol use: Yes    Alcohol/week: 0.0 standard drinks    Comment: Occasional beer on the weekends  . Drug use: No     Current Outpatient Medications on File Prior to Visit  Medication Sig Dispense Refill  . acetaminophen (TYLENOL) 325 MG tablet Take 1-2 tablets (325-650 mg total) by mouth every 4 (four) hours as needed for mild pain (or temp >/= 101 F). 100 tablet 0  . albuterol (PROAIR HFA) 108 (90 BASE) MCG/ACT inhaler  Inhale 2 puffs into the lungs 3 (three) times daily as needed (cough). 18 g 2  . aspirin 81 MG EC tablet Take 1 tablet (81 mg total) by mouth daily. 30 tablet 0  . atorvastatin (LIPITOR) 40 MG tablet TAKE 1 TABLET(40 MG) BY MOUTH DAILY 90 tablet 1  . blood glucose meter kit and supplies KIT 1 each by Does not apply route daily as needed. Dispense based on patient and insurance preference. Use up to four times daily as directed. (FOR ICD-9 250.00, 250.01). 1 each 0  . BRILINTA 60 MG TABS tablet TAKE 1 TABLET BY MOUTH TWICE DAILY 180 tablet 3  . fenofibrate (TRICOR) 145 MG tablet TAKE 1 TABLET BY MOUTH DAILY 30 tablet 6  . Fluticasone-Salmeterol (ADVAIR DISKUS) 250-50 MCG/DOSE AEPB Inhale  1 puff into the lungs 2 (two) times daily. 3 each 3  . glipiZIDE (GLUCOTROL) 5 MG tablet Take 1 tablet (5 mg total) by mouth 2 (two) times daily before a meal. 180 tablet 3  . glucosamine-chondroitin 500-400 MG tablet Take 1 tablet by mouth 2 (two) times daily.    . hydrochlorothiazide (HYDRODIURIL) 25 MG tablet TAKE 1 TABLET BY MOUTH EVERY DAY 90 tablet 3  . Insulin Glargine (LANTUS SOLOSTAR) 100 UNIT/ML Solostar Pen Inject up to 35 units per day 5 pen PRN  . Insulin Pen Needle (B-D UF III MINI PEN NEEDLES) 31G X 5 MM MISC Use as instructed to inject insulin.  Diagnosis:  250.00  Insulin dependent. 100 each 3  . lisinopril (ZESTRIL) 40 MG tablet TAKE 1 TABLET BY MOUTH EVERY DAY 90 tablet 0  . meclizine (ANTIVERT) 25 MG tablet Take 25 mg by mouth every 6 (six) hours as needed. For dizziness/vertigo    . metFORMIN (GLUCOPHAGE) 500 MG tablet TAKE 2 TABLETS BY MOUTH TWICE A DAY WITH A MEAL 360 tablet 1  . metoprolol tartrate (LOPRESSOR) 25 MG tablet TAKE 1/2 TABLET BY MOUTH TWICE DAILY 90 tablet 3  . Multiple Vitamins-Minerals (PRESERVISION AREDS 2 PO) Take 1 tablet by mouth 2 (two) times daily.    . nitroGLYCERIN (NITROSTAT) 0.4 MG SL tablet Place 1 tablet (0.4 mg total) under the tongue every 5 (five) minutes as  needed. For chest pain 25 tablet 12  . traMADol (ULTRAM) 50 MG tablet Take 1 tablet (50 mg total) by mouth every 6 (six) hours as needed. 30 tablet 0   No current facility-administered medications on file prior to visit.      Family Hx: The patient's family history includes CAD in his mother; Diabetes in his mother; Heart failure in his father. There is no history of Colon cancer or Prostate cancer.  ROS:   Please see the history of present illness.    Review of Systems  Constitutional: Negative.   HENT: Negative.   Respiratory: Negative.   Cardiovascular: Negative.   Gastrointestinal: Negative.   Musculoskeletal: Positive for joint pain.  Neurological: Negative.   Psychiatric/Behavioral: Negative.   All other systems reviewed and are negative.    Labs/Other Tests and Data Reviewed:    Recent Labs: 09/22/2018: BUN 34; Creatinine, Ser 1.64; Hemoglobin 12.4; Magnesium 1.8; Platelets 177; Potassium 4.6; Sodium 139   Recent Lipid Panel Lab Results  Component Value Date/Time   CHOL 127 12/25/2017 08:39 AM   CHOL 135 10/25/2014 03:02 AM   TRIG 360.0 (H) 12/25/2017 08:39 AM   TRIG 434 (H) 10/25/2014 03:02 AM   HDL 22.70 (L) 12/25/2017 08:39 AM   HDL 21 (L) 10/25/2014 03:02 AM   CHOLHDL 6 12/25/2017 08:39 AM   LDLCALC UNABLE TO CALCULATE IF TRIGLYCERIDE OVER 400 mg/dL 09/03/2016 05:36 PM   LDLCALC SEE COMMENT 10/25/2014 03:02 AM   LDLDIRECT 58.0 12/25/2017 08:39 AM    Wt Readings from Last 3 Encounters:  04/17/19 195 lb (88.5 kg)  12/31/18 201 lb (91.2 kg)  12/05/18 201 lb 12 oz (91.5 kg)     Exam:    Vital Signs: Vital signs may also be detailed in the HPI BP 136/74   Pulse 66   Ht _0  (1.753 m)   Wt 195 lb (88.5 kg)   BMI 28.80 kg/m   Wt Readings from Last 3 Encounters:  04/17/19 195 lb (88.5 kg)  12/31/18 201 lb (91.2 kg)  12/05/18 201 lb 12 oz (  91.5 kg)   Temp Readings from Last 3 Encounters:  12/05/18 98.4 F (36.9 C) (Oral)  11/07/18 98.3 F (36.8 C)  (Oral)  09/22/18 98.2 F (36.8 C) (Oral)   BP Readings from Last 3 Encounters:  04/17/19 136/74  12/31/18 100/62  12/05/18 130/82   Pulse Readings from Last 3 Encounters:  04/17/19 66  12/31/18 73  12/05/18 71     Well nourished, well developed male in no acute distress. Constitutional:  oriented to person, place, and time. No distress.    ASSESSMENT & PLAN:    Problem List Items Addressed This Visit      Cardiology Problems   DM (diabetes mellitus), type 2 with peripheral vascular complications (HCC)   Carotid artery disease (HCC)   CAD (coronary artery disease), native coronary artery   Essential hypertension   AAA (abdominal aortic aneurysm) without rupture (HCC)   Popliteal artery aneurysm (HCC)   PAD (peripheral artery disease) ([place]) - Primary     PAD Carotid endarterectomy March 2020, recovered well Stressed importance of aggressive diabetes control  Diabetes type 2 with complications Hemoglobin A1c typically 8 or higher In the past with poor diet, ice cream Stressed importance of aggressive diet Linked his diabetes with his PAD with him  CAD with stable angina Currently with no symptoms of angina. No further workup at this time. Continue current medication regimen.  Hyperlipidemia Reports typically has lab work with primary care before the end of the year Previous numbers at goal  Hypertension Does not check it on a routine basis at home Suggested he consider checking it periodically  Acute renal failure Elevated following carotid endarterectomy March 2020, no check since that time Would like to wait on repeat lab work until he sees primary care Number was above his baseline, likely prerenal state Stressed importance of aggressive diabetes control and effect on kidney function  COVID-19 Education: The signs and symptoms of COVID-19 were discussed with the patient and how to seek care for testing (follow up with PCP or arrange E-visit).  The  importance of social distancing was discussed today.  Patient Risk:   After full review of this patients clinical status, I feel that they are at least moderate risk at this time.  Time:   Today, I have spent 25 minutes with the patient with telehealth technology discussing the cardiac and medical problems/diagnoses detailed above   Additional 10 min spent reviewing the chart prior to patient visit today   Medication Adjustments/Labs and Tests Ordered: Current medicines are reviewed at length with the patient today.  Concerns regarding medicines are outlined above.   Tests Ordered: No tests ordered   Medication Changes: No changes made   Disposition: Follow-up in 12 months   Signed, Ida Rogue, MD  [place] Office [address] [place] [address], [place], [place] [address]

## 2019-04-17 ENCOUNTER — Other Ambulatory Visit: Payer: Self-pay

## 2019-04-17 ENCOUNTER — Telehealth (INDEPENDENT_AMBULATORY_CARE_PROVIDER_SITE_OTHER): Payer: Medicare Other | Admitting: Cardiovascular Disease

## 2019-04-17 VITALS — BP 136/74 | HR 66 | Ht 69.0 in | Wt 195.0 lb

## 2019-04-17 DIAGNOSIS — I724 Aneurysm of artery of lower extremity: Secondary | ICD-10-CM | POA: Diagnosis not present

## 2019-04-17 DIAGNOSIS — I714 Abdominal aortic aneurysm, without rupture, unspecified: Secondary | ICD-10-CM

## 2019-04-17 DIAGNOSIS — I25118 Atherosclerotic heart disease of native coronary artery with other forms of angina pectoris: Secondary | ICD-10-CM | POA: Diagnosis not present

## 2019-04-17 DIAGNOSIS — I739 Peripheral vascular disease, unspecified: Secondary | ICD-10-CM | POA: Diagnosis not present

## 2019-04-17 DIAGNOSIS — I6522 Occlusion and stenosis of left carotid artery: Secondary | ICD-10-CM | POA: Diagnosis not present

## 2019-04-17 DIAGNOSIS — E1151 Type 2 diabetes mellitus with diabetic peripheral angiopathy without gangrene: Secondary | ICD-10-CM | POA: Diagnosis not present

## 2019-04-17 DIAGNOSIS — N179 Acute kidney failure, unspecified: Secondary | ICD-10-CM

## 2019-04-17 DIAGNOSIS — I10 Essential (primary) hypertension: Secondary | ICD-10-CM

## 2019-04-17 NOTE — Patient Instructions (Addendum)
Medication Instructions:  No changes  If you need a refill on your cardiac medications before your next appointment, please call your pharmacy.    Lab work: No new labs needed   If you have labs (blood work) drawn today and your tests are completely normal, you will receive your results only by: . MyChart Message (if you have MyChart) OR . A paper copy in the mail If you have any lab test that is abnormal or we need to change your treatment, we will call you to review the results.   Testing/Procedures: No new testing needed   Follow-Up: At [facility], you and your health needs are our priority.  As part of our continuing mission to provide you with exceptional heart care, we have created designated Provider Care Teams.  These Care Teams include your primary Cardiologist (physician) and Advanced Practice Providers (APPs -  Physician Assistants and Nurse Practitioners) who all work together to provide you with the care you need, when you need it.  . You will need a follow up appointment in 12 months (October 2021) .   Please call our office 2 months in advance to schedule this appointment.  (Call in early August 2021 to schedule)  . Providers on your designated Care Team:   . Christopher Berge, NP . Ryan Dunn, PA-C . Jacquelyn Visser, PA-C  Any Other Special Instructions Will Be Listed Below (If Applicable).  For educational health videos Log in to : [email or web address] Or : [email or web address], password : [email or web address]   

## 2019-04-23 ENCOUNTER — Other Ambulatory Visit: Payer: Self-pay | Admitting: Cardiovascular Disease

## 2019-04-23 ENCOUNTER — Other Ambulatory Visit: Payer: Self-pay | Admitting: Family Medicine

## 2019-04-23 ENCOUNTER — Other Ambulatory Visit: Payer: Self-pay

## 2019-04-23 NOTE — Telephone Encounter (Signed)
*  STAT* If patient is at the pharmacy, call can be transferred to refill team.   1. Which medications need to be refilled? (please list name of each medication and dose if known) Atorvastatin  2. Which pharmacy/location (including street and city if local pharmacy) is medication to be sent to? [address]  3. Do they need a 30 day or 90 day supply? [name]

## 2019-04-29 ENCOUNTER — Other Ambulatory Visit: Payer: Self-pay | Admitting: Cardiovascular Disease

## 2019-05-11 DIAGNOSIS — Z23 Encounter for immunization: Secondary | ICD-10-CM | POA: Diagnosis not present

## 2019-05-24 ENCOUNTER — Telehealth: Payer: Self-pay

## 2019-05-24 MED ORDER — LOPERAMIDE HCL 2 MG PO TABS: 2.0000 mg | ORAL_TABLET | Freq: Three times a day (TID) | ORAL | Status: DC | PRN

## 2019-05-24 NOTE — Telephone Encounter (Signed)
If he is getting lightheaded at all or if not getting better, then needs in person eval.   I wouldn't expect his sx to be med related except for possibly the metformin.   I would hold metformin for now and see if he gets better.  Please have him update [place] Monday about that.  He can also try imodium 2mg  tid prn for loose stools.  Can get that OTC.   He is due for DM2 f/u when possible.  Thanks.

## 2019-05-24 NOTE — Telephone Encounter (Signed)
Pt left v/m that he has been calling all day and has not gotten cb from this office. Prior to the v/m left on triage phone at 3:03 pm triage has not gotten v/m from pt. I called pt from caller ID on phone since pt did not leave contact phone #the patient has had intestinal problem; diarrhea every hour. Pt said not emergency but wants his doctors opinion. Pt has diarrhea q 1 h for 7-10 days;pt has sense of urgency when has feeling to have BM. There is some watery diarrhea but mostly loose stool; no blood or mucus.No abd pain,no N&V and no diet change.Pt has no other covid symptoms, no travel and no known exposure to + covid. Pt has been drinking a lot of liquids; does not think dehydrated; no dry mouth, lightheadedness or abd pain.pt has not taken any OTC meds for diarrhea.  Pt takes Brilinta and does have some bruising. [facility] [address]. Pt request cb.

## 2019-05-24 NOTE — Telephone Encounter (Signed)
Patient advised.

## 2019-05-27 NOTE — Telephone Encounter (Signed)
Pt left v/m wanting to let [place] CMA know that immodium worked;pt is back to normal and very appreciative. Will cb if needed.

## 2019-06-14 DIAGNOSIS — H43813 Vitreous degeneration, bilateral: Secondary | ICD-10-CM | POA: Diagnosis not present

## 2019-06-14 DIAGNOSIS — H43391 Other vitreous opacities, right eye: Secondary | ICD-10-CM | POA: Diagnosis not present

## 2019-06-14 DIAGNOSIS — H353124 Nonexudative age-related macular degeneration, left eye, advanced atrophic with subfoveal involvement: Secondary | ICD-10-CM | POA: Diagnosis not present

## 2019-06-14 DIAGNOSIS — H353211 Exudative age-related macular degeneration, right eye, with active choroidal neovascularization: Secondary | ICD-10-CM | POA: Diagnosis not present

## 2019-06-21 ENCOUNTER — Other Ambulatory Visit: Payer: Self-pay | Admitting: *Deleted

## 2019-06-21 MED ORDER — METFORMIN HCL 500 MG PO TABS: ORAL_TABLET | ORAL | 1 refills | Status: DC

## 2019-06-24 ENCOUNTER — Encounter: Payer: Self-pay | Admitting: Family Medicine

## 2019-06-24 ENCOUNTER — Other Ambulatory Visit: Payer: Self-pay | Admitting: *Deleted

## 2019-06-24 ENCOUNTER — Ambulatory Visit (INDEPENDENT_AMBULATORY_CARE_PROVIDER_SITE_OTHER): Payer: Medicare Other | Admitting: Family Medicine

## 2019-06-24 ENCOUNTER — Other Ambulatory Visit: Payer: Self-pay

## 2019-06-24 VITALS — BP 116/66 | HR 70 | Temp 96.7°F | Ht 69.0 in | Wt 202.6 lb

## 2019-06-24 DIAGNOSIS — E119 Type 2 diabetes mellitus without complications: Secondary | ICD-10-CM | POA: Diagnosis not present

## 2019-06-24 DIAGNOSIS — E1151 Type 2 diabetes mellitus with diabetic peripheral angiopathy without gangrene: Secondary | ICD-10-CM

## 2019-06-24 DIAGNOSIS — R1030 Lower abdominal pain, unspecified: Secondary | ICD-10-CM | POA: Diagnosis not present

## 2019-06-24 DIAGNOSIS — I6523 Occlusion and stenosis of bilateral carotid arteries: Secondary | ICD-10-CM

## 2019-06-24 LAB — COMPREHENSIVE METABOLIC PANEL
ALT: 12 U/L (ref 0–53)
AST: 12 U/L (ref 0–37)
Albumin: 4.2 g/dL (ref 3.5–5.2)
Alkaline Phosphatase: 38 U/L — ABNORMAL LOW (ref 39–117)
BUN: 27 mg/dL — ABNORMAL HIGH (ref 6–23)
CO2: 28 mEq/L (ref 19–32)
Calcium: 9.8 mg/dL (ref 8.4–10.5)
Chloride: 101 mEq/L (ref 96–112)
Creatinine, Ser: 1.71 mg/dL — ABNORMAL HIGH (ref 0.40–1.50)
GFR: 39.31 mL/min — ABNORMAL LOW (ref >60.00)
Glucose, Bld: 170 mg/dL — ABNORMAL HIGH (ref 70–99)
Potassium: 4.1 mEq/L (ref 3.5–5.1)
Sodium: 138 mEq/L (ref 135–145)
Total Bilirubin: 0.4 mg/dL (ref 0.2–1.2)
Total Protein: 7.2 g/dL (ref 6.0–8.3)

## 2019-06-24 LAB — CBC WITH DIFFERENTIAL/PLATELET
Basophils Absolute: 0.1 10*3/uL (ref 0.0–0.1)
Basophils Relative: 0.7 % (ref 0.0–3.0)
Eosinophils Absolute: 0.3 10*3/uL (ref 0.0–0.7)
Eosinophils Relative: 3.1 % (ref 0.0–5.0)
HCT: 45.4 % (ref 39.0–52.0)
Hemoglobin: 15.1 g/dL (ref 13.0–17.0)
Lymphocytes Relative: 17.5 % (ref 12.0–46.0)
Lymphs Abs: 1.8 10*3/uL (ref 0.7–4.0)
MCHC: 33.2 g/dL (ref 30.0–36.0)
MCV: 94.1 fl (ref 78.0–100.0)
Monocytes Absolute: 0.9 10*3/uL (ref 0.1–1.0)
Monocytes Relative: 8.4 % (ref 3.0–12.0)
Neutro Abs: 7.2 10*3/uL (ref 1.4–7.7)
Neutrophils Relative %: 70.3 % (ref 43.0–77.0)
Platelets: 225 10*3/uL (ref 150.0–400.0)
RBC: 4.82 Mil/uL (ref 4.22–5.81)
RDW: 14.3 % (ref 11.5–15.5)
WBC: 10.2 10*3/uL (ref 4.0–10.5)

## 2019-06-24 LAB — POC URINALSYSI DIPSTICK (AUTOMATED)
Bilirubin, UA: NEGATIVE
Blood, UA: NEGATIVE
Glucose, UA: NEGATIVE
Ketones, UA: NEGATIVE
Leukocytes, UA: NEGATIVE
Nitrite, UA: NEGATIVE
Protein, UA: NEGATIVE
Spec Grav, UA: 1.025 (ref 1.010–1.025)
Urobilinogen, UA: 0.2 E.U./dL
pH, UA: 5.5 (ref 5.0–8.0)

## 2019-06-24 LAB — HEMOGLOBIN A1C: Hgb A1c MFr Bld: 9.4 % — ABNORMAL HIGH (ref 4.6–6.5)

## 2019-06-24 MED ORDER — BD PEN NEEDLE MINI U/F 31G X 5 MM MISC: 3 refills | Status: DC

## 2019-06-24 NOTE — Progress Notes (Signed)
This visit occurred during the SARS-CoV-2 public health emergency.  Safety protocols were in place, including screening questions prior to the visit, additional usage of staff PPE, and extensive cleaning of exam room while observing appropriate contact time as indicated for disinfecting solutions.  Lower abd pain, not sharp but sull.  Some discomfort with rotation at the waist and with the movement to get out of a chair.  Less painful when standing.  No pain sitting or at rest.  Going on about 1 week.  No fevers.  No chills.  No vomiting.  No diarrhea except for about 1 month ago.  That got better in the meantime, with imodium.  No black stools.  No blood in stools.  No blood in urine.  No dysuria.  No known trigger for pain, no atypical heavy lifting.  No rash, no bruising.  He isn't worse overall now since sx started.  He has back pain at baseline.  Compliant with baseline meds. CT with prev diverticula but no h/o diverticulitis.  This doesn't feel similar to prev UTI.  No burning with urination.   Due for f/u labs re: DM2.  See notes on labs.He is still on metformin at baseline.    PMH and SH reviewed  ROS: Per HPI unless specifically indicated in ROS section   Meds, vitals, and allergies reviewed.   GEN: nad, alert and oriented HEENT: mucous membranes moist NECK: supple w/o LA CV: rrr.  PULM: ctab, no inc wob ABD: soft, +bs, ttp at insertion of the lower rectus sheath but no rebound. No bruising.  abd not ttp o/w.  EXT: no edema SKIN: no acute rash

## 2019-06-24 NOTE — Patient Instructions (Addendum)
This looks like a lower abdominal wall strain.  Go to the lab on the way out.  We'll contact you with your lab report. I would expect this to get better in the next few days assuming you don't over-exert/strain.   If not better in the meantime then let me know.   Take care.  Glad to see you.

## 2019-06-27 DIAGNOSIS — R1030 Lower abdominal pain, unspecified: Secondary | ICD-10-CM | POA: Insufficient documentation

## 2019-06-27 NOTE — Assessment & Plan Note (Addendum)
This looks like a lower abdominal wall strain.  See notes on labs. I would expect this to get better in the next few days assuming he does not over-exert/strain.   If not better in the meantime then he will let me know.  Okay for outpatient follow-up.

## 2019-06-27 NOTE — Assessment & Plan Note (Signed)
See notes on labs. 

## 2019-07-07 ENCOUNTER — Other Ambulatory Visit: Payer: Self-pay | Admitting: Cardiovascular Disease

## 2019-07-08 ENCOUNTER — Telehealth: Payer: Self-pay | Admitting: Family Medicine

## 2019-07-08 MED ORDER — METFORMIN HCL 500 MG PO TABS: 1000.0000 mg | ORAL_TABLET | Freq: Every day | ORAL | Status: DC

## 2019-07-08 NOTE — Telephone Encounter (Signed)
I left message for patient to return phone call.   

## 2019-07-08 NOTE — Telephone Encounter (Signed)
Left message with wife to have him return the call.

## 2019-07-08 NOTE — Telephone Encounter (Signed)
I saw his list of sugars.  I thank him for the update.   He has variable sugars with generally lower readings in the AMs and higher later in the day.   I would cut back on metformin.  Take 2 of the 500mg  tabs in the AM with breakfast but skip the PM metformin.  Then gradually increase his insulin dose by 1 unit per day.  Keep increasing his insulin until his AM sugars are consistently <150 with a goal ~120 in the AMs.   Skipping the PM metformin may help avoid low sugars overnight or early AM.  Have him please update me about his sugars and insulin dose in about 10 days, sooner if needed.  Thanks.

## 2019-07-09 NOTE — Telephone Encounter (Signed)
Patient advised.

## 2019-07-09 NOTE — Telephone Encounter (Signed)
Pt left v/m requesting cb from [place].

## 2019-07-11 ENCOUNTER — Encounter (INDEPENDENT_AMBULATORY_CARE_PROVIDER_SITE_OTHER): Payer: Medicare Other

## 2019-07-11 ENCOUNTER — Ambulatory Visit (INDEPENDENT_AMBULATORY_CARE_PROVIDER_SITE_OTHER): Payer: Medicare Other | Admitting: Vascular Surgery

## 2019-07-17 ENCOUNTER — Encounter (INDEPENDENT_AMBULATORY_CARE_PROVIDER_SITE_OTHER): Payer: Self-pay | Admitting: Nurse Practitioner

## 2019-07-17 ENCOUNTER — Ambulatory Visit (INDEPENDENT_AMBULATORY_CARE_PROVIDER_SITE_OTHER): Payer: Medicare Other | Admitting: Nurse Practitioner

## 2019-07-17 ENCOUNTER — Other Ambulatory Visit: Payer: Self-pay

## 2019-07-17 ENCOUNTER — Ambulatory Visit (INDEPENDENT_AMBULATORY_CARE_PROVIDER_SITE_OTHER): Payer: Medicare Other

## 2019-07-17 VITALS — BP 120/76 | HR 63 | Resp 18 | Ht 70.0 in | Wt 201.0 lb

## 2019-07-17 DIAGNOSIS — I724 Aneurysm of artery of lower extremity: Secondary | ICD-10-CM

## 2019-07-17 DIAGNOSIS — I739 Peripheral vascular disease, unspecified: Secondary | ICD-10-CM | POA: Diagnosis not present

## 2019-07-17 DIAGNOSIS — I10 Essential (primary) hypertension: Secondary | ICD-10-CM | POA: Diagnosis not present

## 2019-07-17 DIAGNOSIS — I6523 Occlusion and stenosis of bilateral carotid arteries: Secondary | ICD-10-CM

## 2019-07-17 DIAGNOSIS — I714 Abdominal aortic aneurysm, without rupture, unspecified: Secondary | ICD-10-CM

## 2019-07-17 DIAGNOSIS — I77811 Abdominal aortic ectasia: Secondary | ICD-10-CM | POA: Diagnosis not present

## 2019-07-17 NOTE — Progress Notes (Signed)
SUBJECTIVE:  Patient ID: [name], male    DOB: 1945-10-27, 74 y.o.   MRN: [id number] Chief Complaint  Patient presents with  . Follow-up    HPI  [name] is a 74 y.o. male the presents today to follow-up on a number of noninvasive studies.  The patient has a previous history of a occluded popliteal artery aneurysm which required a bypass graft in 2016.  The patient also had a left carotid endarterectomy on 09/21/2018.  Since the last visit the patient reports being in his normal state of health.  He endorses having some claudication pain however is not significant.  He denies fever, chills, nausea, vomiting.  He denies any rest pain like symptoms.   The patient underwent a carotid artery duplex which revealed velocities of 1 to 39% in the right internal carotid artery with a patent left carotid endarterectomy site.  There are some mildly elevated velocities however they are significantly decreased from the previous exam on 01/24/2018 at the outside facility.  Based on velocities it would be consistent with a less than 50% stenosis.  Patient also underwent bilateral ABIs which reveals an ABI of 1.15 on the right and 1.04 on the left.  The patient had triphasic waveforms throughout the bilateral tibial arteries.  Patient also has good toe waveforms bilaterally.  The patient also underwent a lower extremity arterial duplex study to assess for popliteal artery aneurysms.  The left has a known occlusion.  The bypass graft shows triphasic flow throughout.  The right popliteal shows triphasic waveforms with no any evidence of aneurysm or significant stenosis.  The patient also underwent an abdominal aortic aneurysm duplex which revealed a maximum diameter of 2.9 cm at the proximal aorta.  No evidence of iliac artery aneurysms.  This is consistent with the prior exam done on 07/10/2018.  Past Medical History:  Diagnosis Date  . CAD (coronary artery disease)    a. 1999: PCI-->LAD 2/2 MI;  b. 2005: inf MI s/p PCI/DES x 4 to RCA; c. Myoview in 12/2005: EF 61%, no evidence for ischemia; d. cath 10/2014: occluded mLAD w/ L to L and L to R collats, dRCA 95% s/p PCI/DES 0%, mPDA 70%, LCx mild to mod irregs, procedure complicated by inf ST ele in recovery, repeat cath showed acute dRCA stent thrombosis o/w occluded mRPDA, PTCA dRCA, PCI/DES RPDA, aggrastat x 18 hr  . CKD (chronic kidney disease)   . COPD (chronic obstructive pulmonary disease) ([place])   . DM2 (diabetes mellitus, type 2) ([place])   . H/O hiatal hernia   . Heart attack ([place]) [phone]   had 2 in one day(released plaque during angio). 5 stents total  . HTN (hypertension)   . Hypercholesterolemia   . Impaired fasting glucose    Elevated after steroid injection  . Macular degeneration of right eye 2020   receiving injections into eye  . Osteoarthritis of hip    injections in both hips  . Osteoarthritis, knee   . PAD (peripheral artery disease) (H[facility])    L fem to below the knee popliteal vein bypass graft  . Peri-rectal abscess 12/27/2011  . Tobacco abuse    Prior    Past Surgical History:  Procedure Laterality Date  . CORONARY ANGIOPLASTY    . [place], 2006, 2016 x 2   Multiple, LAD in 1999, RCA in 2005.  has a total of 4 stents  . ENDARTERECTOMY Left 09/21/2018   Procedure: ENDARTERECTOMY CAROTID;  Surgeon: Katha Cabal, MD;  Location: [facility];  Service: Vascular;  Laterality: Left;  . FEMORAL-POPLITEAL BYPASS GRAFT  2016   L fem to below the knee popliteal vein bypass graft  . INCISE AND DRAIN ABCESS  2007   on scrotum  . INCISION AND DRAINAGE PERIRECTAL ABSCESS  12/28/2011   Procedure: IRRIGATION AND DEBRIDEMENT PERIRECTAL ABSCESS;  Surgeon: Merrie Roof, MD;  Location: [place];  Service: General;  Laterality: N/A;    Social History   Socioeconomic History  . Marital status: Married    Spouse name: wanda  . Number of children: 2  . Years of education: Not on file  . Highest  education level: Not on file  Occupational History  . Occupation: Press photographer for a [employer]    Comment: still works full time  Tobacco Use  . Smoking status: Former Smoker    Packs/day: 1.00    Years: 40.00    Pack years: 40.00    Types: Cigarettes    Quit date: 10/21/1998    Years since quitting: 20.7  . Smokeless tobacco: Never Used  . Tobacco comment: Past smoker, states he will smoke maybe 1 cigarette/ month or so still  Substance and Sexual Activity  . Alcohol use: Yes    Alcohol/week: 0.0 standard drinks    Comment: Occasional beer on the weekends  . Drug use: No  . Sexual activity: Never  Other Topics Concern  . Not on file  Social History Narrative   Lives in [place]   Married 50+ years   2 grown daughters, 7 grandchildren, 4 great grandchildren   Designated Party Release Form signed on 01/19/10 appointing Gerhard Munch.    Social Determinants of Health   Financial Resource Strain:   . Difficulty of Paying Living Expenses: Not on file  Food Insecurity:   . Worried About Charity fundraiser in the Last Year: Not on file  . Ran Out of Food in the Last Year: Not on file  Transportation Needs:   . Lack of Transportation (Medical): Not on file  . Lack of Transportation (Non-Medical): Not on file  Physical Activity:   . Days of Exercise per Week: Not on file  . Minutes of Exercise per Session: Not on file  Stress:   . Feeling of Stress : Not on file  Social Connections:   . Frequency of Communication with Friends and Family: Not on file  . Frequency of Social Gatherings with Friends and Family: Not on file  . Attends Religious Services: Not on file  . Active Member of Clubs or Organizations: Not on file  . Attends Archivist Meetings: Not on file  . Marital Status: Not on file  Intimate Partner Violence:   . Fear of Current or Ex-Partner: Not on file  . Emotionally Abused: Not on file  . Physically Abused: Not on file  . Sexually Abused: Not  on file    Family History  Problem Relation Age of Onset  . Heart failure Father        CHF  . CAD Mother   . Diabetes Mother   . Colon cancer Neg Hx   . Prostate cancer Neg Hx     Allergies  Allergen Reactions  . Augmentin [Amoxicillin-Pot Clavulanate] Other (See Comments)    Nausea,vomiting,diarrhea  . Morphine And Related Nausea And Vomiting    Vomiting, GI upset     Review of Systems   Review of Systems: Negative Unless  Checked Constitutional: _0 Weight loss  _1 Fever  _2 Chills Cardiac: _3 Chest pain   _4  Atrial Fibrillation  _5 Palpitations   _6 Shortness of breath when laying flat   _7 Shortness of breath with exertion. _8 Shortness of breath at rest Vascular:  _9 Pain in legs with walking   _10 Pain in legs with standing _11 Pain in legs when laying flat   _12 Claudication    _13 Pain in feet when laying flat    _14 History of DVT   _15 Phlebitis   _16 Swelling in legs   _17 Varicose veins   _18 Non-healing ulcers Pulmonary:   _19 Uses home oxygen   _20 Productive cough   _21 Hemoptysis   _22 Wheeze  _23 COPD   _24 Asthma Neurologic:  _25 Dizziness   _26 Seizures  _27 Blackouts _28 History of stroke   _29 History of TIA  _30 Aphasia   _31 Temporary Blindness   _32 Weakness or numbness in arm   _33 Weakness or numbness in leg Musculoskeletal:   _34 Joint swelling   _35 Joint pain   _36 Low back pain  _37  History of Knee Replacement _38 Arthritis _39 back Surgeries  _40  Spinal Stenosis    Hematologic:  _41 Easy bruising  _42 Easy bleeding   _43 Hypercoagulable state   _44 Anemic Gastrointestinal:  _45 Diarrhea   _46 Vomiting  _47 Gastroesophageal reflux/heartburn   _48 Difficulty swallowing. _49 Abdominal pain Genitourinary:  _50 Chronic kidney disease   _51 Difficult urination  _52 Anuric   _53 Blood in urine _54 Frequent urination  _55 Burning with urination   _56 Hematuria Skin:  _57 Rashes   _58 Ulcers _59 Wounds Psychological:  _60 History of anxiety   _61  History of major depression  _62  Memory Difficulties      OBJECTIVE:   Physical Exam  BP 120/76 (BP Location:  Right Arm)   Pulse 63   Resp 18   Ht _63  (1.778 m)   Wt 201 lb (91.2 kg)   BMI 28.84 kg/m   Gen: WD/WN, NAD Head: [place]/AT, No temporalis wasting.  Ear/Nose/Throat: Hearing grossly intact, nares w/o erythema or drainage Eyes: PER, EOMI, sclera nonicteric.  Neck: Supple, no masses.  No JVD.  Pulmonary:  Good air movement, no use of accessory muscles.  Cardiac: RRR Vascular:  Vessel Right Left  Radial Palpable Palpable  Dorsalis Pedis Palpable Palpable  Posterior Tibial Palpable Palpable   Gastrointestinal: soft, non-distended. No guarding/no peritoneal signs.  Musculoskeletal: M/S 5/5 throughout.  No deformity or atrophy.  Neurologic: Pain and light touch intact in extremities.  Symmetrical.  Speech is fluent. Motor exam as listed above. Psychiatric: Judgment intact, Mood & affect appropriate for pt's clinical situation. Dermatologic: No Venous rashes. No Ulcers Noted.  No changes consistent with cellulitis.         ASSESSMENT AND PLAN:  1. Popliteal artery aneurysm (HCC) Patent bypass due to occluded left popliteal artery.  No evidence of significant stenosis.  Triphasic waveforms throughout graft.  We will follow-up in 12 months.  2. Bilateral carotid artery stenosis Recommend:  Given the patient's asymptomatic subcritical stenosis no further invasive testing or surgery at this time.  Duplex ultrasound shows 1 to 39% stenosis of the right internal carotid artery with a patent left endarterectomy site.  Velocities slightly elevated in the left mid ICA however these are still minimal.  Continue antiplatelet therapy as prescribed Continue management of CAD, HTN and Hyperlipidemia Healthy heart diet,  encouraged exercise at least 4 times per week Follow up in 12 months with duplex ultrasound and physical exam   3. Aortic ectasia, abdominal (HCC) Based on noninvasive studies today the patient has a ectatic aorta with a maximal measurement measuring 2.9 cm.  By this  measurement the patient is not  quite a true aneurysm.  Based on this we will follow-up in 2 years with noninvasive studies.  4. Essential hypertension Good blood pressure control is essential for long atherosclerotic disease, good blood pressure today.  On appropriate medications no changes made.  5. PAD (peripheral artery disease) (HCC)  Recommend:  The patient has evidence of atherosclerosis of the lower extremities with claudication.  The patient does not voice lifestyle limiting changes at this point in time.  Noninvasive studies do not suggest clinically significant change.  No invasive studies, angiography or surgery at this time The patient should continue walking and begin a more formal exercise program.  The patient should continue antiplatelet therapy and aggressive treatment of the lipid abnormalities  No changes in the patient's medications at this time  The patient should continue wearing graduated compression socks 10-15 mmHg strength to control the mild edema.   We will follow-up in 12 months  Current Outpatient Medications on File Prior to Visit  Medication Sig Dispense Refill  . acetaminophen (TYLENOL) 325 MG tablet Take 1-2 tablets (325-650 mg total) by mouth every 4 (four) hours as needed for mild pain (or temp >/= 101 F). 100 tablet 0  . albuterol (PROAIR HFA) 108 (90 BASE) MCG/ACT inhaler Inhale 2 puffs into the lungs 3 (three) times daily as needed (cough). 18 g 2  . aspirin 81 MG EC tablet Take 1 tablet (81 mg total) by mouth daily. 30 tablet 0  . atorvastatin (LIPITOR) 40 MG tablet TAKE 1 TABLET BY MOUTH EVERY DAY 90 tablet 3  . blood glucose meter kit and supplies KIT 1 each by Does not apply route daily as needed. Dispense based on patient and insurance preference. Use up to four times daily as directed. (FOR ICD-9 250.00, 250.01). 1 each 0  . BRILINTA 60 MG TABS tablet TAKE 1 TABLET BY MOUTH TWICE DAILY 180 tablet 3  . fenofibrate (TRICOR) 145 MG tablet TAKE 1  TABLET(145 MG) BY MOUTH DAILY 30 tablet 11  . Fluticasone-Salmeterol (ADVAIR DISKUS) 250-50 MCG/DOSE AEPB Inhale 1 puff into the lungs 2 (two) times daily. 3 each 3  . glipiZIDE (GLUCOTROL) 5 MG tablet Take 1 tablet (5 mg total) by mouth 2 (two) times daily before a meal. 180 tablet 3  . glucosamine-chondroitin 500-400 MG tablet Take 1 tablet by mouth 2 (two) times daily.    . hydrochlorothiazide (HYDRODIURIL) 25 MG tablet TAKE 1 TABLET BY MOUTH EVERY DAY 90 tablet 3  . Insulin Glargine (LANTUS SOLOSTAR) 100 UNIT/ML Solostar Pen INJECT UP TO 35 UNITS UNDER THE SKIN DAILY 15 mL 0  . Insulin Pen Needle (B-D UF III MINI PEN NEEDLES) 31G X 5 MM MISC Use as instructed to inject insulin.  Diagnosis:  250.00  Insulin dependent. 100 each 3  . lisinopril (ZESTRIL) 40 MG tablet TAKE 1 TABLET BY MOUTH EVERY DAY 90 tablet 0  . loperamide (IMODIUM A-D) 2 MG tablet Take 1 tablet (2 mg total) by mouth 3 (three) times daily as needed for diarrhea or loose stools.    . meclizine (ANTIVERT) 25 MG tablet Take 25 mg by mouth every 6 (six) hours as needed. For dizziness/vertigo    . metFORMIN (GLUCOPHAGE) 500 MG tablet Take 2 tablets (1,000 mg total) by mouth daily with breakfast.    . metoprolol tartrate (LOPRESSOR) 25 MG tablet TAKE 1/2 TABLET BY MOUTH TWICE DAILY 90 tablet 3  . Multiple Vitamins-Minerals (PRESERVISION AREDS 2 PO) Take 1 tablet by mouth 2 (two) times daily.    Marland Kitchen  nitroGLYCERIN (NITROSTAT) 0.4 MG SL tablet Place 1 tablet (0.4 mg total) under the tongue every 5 (five) minutes as needed. For chest pain 25 tablet 12  . traMADol (ULTRAM) 50 MG tablet Take 1 tablet (50 mg total) by mouth every 6 (six) hours as needed. 30 tablet 0   No current facility-administered medications on file prior to visit.    There are no Patient Instructions on file for this visit. No follow-ups on file.   Kris Hartmann, NP  This note was completed with Sales executive.  Any errors are purely unintentional.

## 2019-07-25 ENCOUNTER — Other Ambulatory Visit: Payer: Self-pay | Admitting: Family Medicine

## 2019-08-06 DIAGNOSIS — H353131 Nonexudative age-related macular degeneration, bilateral, early dry stage: Secondary | ICD-10-CM | POA: Diagnosis not present

## 2019-08-06 DIAGNOSIS — H524 Presbyopia: Secondary | ICD-10-CM | POA: Diagnosis not present

## 2019-08-06 DIAGNOSIS — E113393 Type 2 diabetes mellitus with moderate nonproliferative diabetic retinopathy without macular edema, bilateral: Secondary | ICD-10-CM | POA: Diagnosis not present

## 2019-08-09 ENCOUNTER — Other Ambulatory Visit: Payer: Self-pay | Admitting: *Deleted

## 2019-08-09 DIAGNOSIS — H43391 Other vitreous opacities, right eye: Secondary | ICD-10-CM | POA: Diagnosis not present

## 2019-08-09 DIAGNOSIS — H353211 Exudative age-related macular degeneration, right eye, with active choroidal neovascularization: Secondary | ICD-10-CM | POA: Diagnosis not present

## 2019-08-09 DIAGNOSIS — H353123 Nonexudative age-related macular degeneration, left eye, advanced atrophic without subfoveal involvement: Secondary | ICD-10-CM | POA: Diagnosis not present

## 2019-08-09 DIAGNOSIS — H43813 Vitreous degeneration, bilateral: Secondary | ICD-10-CM | POA: Diagnosis not present

## 2019-08-09 MED ORDER — FLUTICASONE-SALMETEROL 250-50 MCG/DOSE IN AEPB: 1.0000 | INHALATION_SPRAY | Freq: Two times a day (BID) | RESPIRATORY_TRACT | 3 refills | Status: DC

## 2019-08-20 ENCOUNTER — Other Ambulatory Visit: Payer: Self-pay | Admitting: *Deleted

## 2019-08-20 MED ORDER — GLIPIZIDE 5 MG PO TABS: 5.0000 mg | ORAL_TABLET | Freq: Two times a day (BID) | ORAL | 1 refills | Status: DC

## 2019-08-26 ENCOUNTER — Other Ambulatory Visit: Payer: Self-pay | Admitting: Cardiovascular Disease

## 2019-09-06 ENCOUNTER — Other Ambulatory Visit: Payer: Self-pay | Admitting: Cardiovascular Disease

## 2019-09-17 ENCOUNTER — Other Ambulatory Visit: Payer: Self-pay | Admitting: Cardiovascular Disease

## 2019-10-04 ENCOUNTER — Other Ambulatory Visit: Payer: Self-pay | Admitting: Cardiovascular Disease

## 2019-10-04 DIAGNOSIS — H353123 Nonexudative age-related macular degeneration, left eye, advanced atrophic without subfoveal involvement: Secondary | ICD-10-CM | POA: Diagnosis not present

## 2019-10-04 DIAGNOSIS — H43391 Other vitreous opacities, right eye: Secondary | ICD-10-CM | POA: Diagnosis not present

## 2019-10-04 DIAGNOSIS — H353211 Exudative age-related macular degeneration, right eye, with active choroidal neovascularization: Secondary | ICD-10-CM | POA: Diagnosis not present

## 2019-10-04 DIAGNOSIS — H43813 Vitreous degeneration, bilateral: Secondary | ICD-10-CM | POA: Diagnosis not present

## 2019-10-16 ENCOUNTER — Telehealth: Payer: Self-pay

## 2019-10-16 NOTE — Telephone Encounter (Signed)
Pt said for 3-4 months pt has been trying to monitor FBS. pts FBS goal is 125-150. Pt takes 2 pills each morning for his sugar(pt is not sure name of diabetes med. Pt's wife fixes pts med box and pts wife is not home now. Pt also takes 45 U of insulin at hs. Pt is trying to follow diabetic diet but pt cannot get FBS below 180. Pt usually varies FBS between 180 - 225.  Pt wants to know what can do to get BS down. Pt last OV 06/14/2019 and 06/14/19 lab results pt was to return 3 mths for OV and A1C. Pt said he is not having sweats, jerking or any diabetic symptoms other than pt is really tired.Pt has no covid symptoms, no travel and no known exposure to + covid.pt does not want to see a different provider and pt scheduled appt with Dr Para March on 10/18/19 at3 PM. Pt will be at office at 2:45. Pt is to bring his FBS log and his meds to see if pt is taking metformin and glipizide or not. UC & ED precautions given and pt voiced understanding. FYI to Dr Para March.

## 2019-10-16 NOTE — Telephone Encounter (Signed)
pts wife (DPR signed) notified as instructed. pts wife said pt takes metformin 500 mg taking 2 tabs in AM with breakfast and takes glipizide 5 mg bid before a meal. Pt also takes the 45U of insulin at hs. Mrs Teagle will bring pts med list, actual bottles of meds if needed and FBS log and pts wife will go back with pt since pts wife fixes pts med box.FYI to Dr Para March.

## 2019-10-16 NOTE — Telephone Encounter (Signed)
Thanks

## 2019-10-16 NOTE — Telephone Encounter (Signed)
I think this is reasonable.  We can get labs at the OV.  I wouldn't change his meds since he is coming into clinic in the near future and his sugars are still <300.

## 2019-10-18 ENCOUNTER — Ambulatory Visit: Payer: Medicare Other | Admitting: Family Medicine

## 2019-10-18 ENCOUNTER — Encounter: Payer: Self-pay | Admitting: Family Medicine

## 2019-10-18 ENCOUNTER — Other Ambulatory Visit: Payer: Self-pay

## 2019-10-18 VITALS — BP 98/52 | HR 67 | Temp 96.7°F | Ht 70.0 in | Wt 198.3 lb

## 2019-10-18 DIAGNOSIS — E119 Type 2 diabetes mellitus without complications: Secondary | ICD-10-CM

## 2019-10-18 DIAGNOSIS — I10 Essential (primary) hypertension: Secondary | ICD-10-CM | POA: Diagnosis not present

## 2019-10-18 DIAGNOSIS — E1151 Type 2 diabetes mellitus with diabetic peripheral angiopathy without gangrene: Secondary | ICD-10-CM

## 2019-10-18 LAB — POCT GLYCOSYLATED HEMOGLOBIN (HGB A1C): Hemoglobin A1C: 9.4 % — AB (ref 4.0–5.6)

## 2019-10-18 MED ORDER — METFORMIN HCL 500 MG PO TABS: 500.0000 mg | ORAL_TABLET | Freq: Two times a day (BID) | ORAL | Status: DC

## 2019-10-18 MED ORDER — LANTUS SOLOSTAR 100 UNIT/ML ~~LOC~~ SOPN: PEN_INJECTOR | SUBCUTANEOUS | Status: DC

## 2019-10-18 NOTE — Progress Notes (Signed)
is visit occurred during the SARS-CoV-2 public health emergency.  Safety protocols were in place, including screening questions prior to the visit, additional usage of staff PPE, and extensive cleaning of exam room while observing appropriate contact time as indicated for disinfecting solutions.  Diabetes:  Using medications without difficulties: Still on metformin- with some GI upset thereafter.  Still on glipizide and 45 units.   Hypoglycemic episodes: no Hyperglycemic episodes: see below.   Feet problems: no Blood Sugars averaging: see below.   eye exam within last year: yes, Dr. Hulen Luster in [place].   He has increased his insulin in the meantime.   Sugar has been variable, 180-225 usually.  Was down to 114 this AM but he didn't have supper last night.    He lost his job recently, d/w pt.  Condolences offered.  He doesn't want to retire.    He had covid vaccine.    History of hypertension.  Currently treated with hydrochlorothiazide and lisinopril.  Lower BP noted.  He has some chronic L>R edema at baseline.  No CP.  Not SOB.    Meds, vitals, and allergies reviewed.  ROS: Per HPI unless specifically indicated in ROS section   GEN: nad, alert and oriented HEENT: ncat NECK: supple w/o LA CV: rrr. PULM: ctab, no inc wob ABD: soft, +bs EXT: no edema SKIN: well perfused.   Diabetic foot exam: Normal inspection No skin breakdown No calluses  Normal DP pulses Normal sensation to light touch and monofilament Nails normal

## 2019-10-18 NOTE — Patient Instructions (Addendum)
Stop hydrochlorothiazide and see if your feel better.  Let me know about your pressure in about 5 days.   Change metformin to 1 tab twice a day.  See if you tolerate that better.  let me know about that too and we'll make plans about your diabetes medicine.   Take care.  Glad to see you.

## 2019-10-20 NOTE — Assessment & Plan Note (Signed)
Change metformin to 1 tab twice a day.  He can see if he tolerates that better and he will let me know about that next week.  We can make plans going forward after that.

## 2019-10-20 NOTE — Assessment & Plan Note (Signed)
Stop hydrochlorothiazide and he will see if he feels better.  He will let me know about his pressure in about 5 days.

## 2019-10-25 ENCOUNTER — Telehealth: Payer: Self-pay

## 2019-10-25 MED ORDER — ONETOUCH ULTRA VI STRP: ORAL_STRIP | 5 refills | Status: DC

## 2019-10-25 MED ORDER — NITROGLYCERIN 0.4 MG SL SUBL: 0.4000 mg | SUBLINGUAL_TABLET | SUBLINGUAL | 12 refills | Status: DC | PRN

## 2019-10-25 NOTE — Telephone Encounter (Signed)
I sent the rxs for strips and NTG.  Insurance may not cover BID testing but I put applicable info on the rx to try to get it covered.   I would stay off HCTZ.   I am glad his sugar has been better in the meantime.  Assuming his BP and sugar stay at current levels, I would continue as is and recheck labs prior to a visit in about 3 months.  Thanks.

## 2019-10-25 NOTE — Telephone Encounter (Signed)
Patient advised. Appointments scheduled. 

## 2019-10-25 NOTE — Telephone Encounter (Signed)
Patient called to give update on his sugar and b/p since his last visit. Patient states he feels better since been off HCTZ. B/P improved and said it has been up and down but nothing over 140/90. This morning for example was 134/68. His sugar also improved, readings have been 119, 118, 129, 132. No hypoglycemic episodes.  Patient also wanted to get a RX sent in for One touch ultra strips he has been using-checks sugar once daily but asked to have directions to say 2 times daily in case he needs to check more.  Patient also said his Nitroglycerin bottle was left in his pants and when pants were washed with the medication bottle, bottle opened and tablets were ruined. Medications pulled down for review. CB [phone]

## 2019-11-01 ENCOUNTER — Telehealth: Payer: Self-pay

## 2019-11-01 IMAGING — CT CT ANGIO NECK
3 of 7 series · 10 of 35 positions shown · IV contrast (iopamidol)
Comparison: No prior neck imaging. CTA abdomen runoff 08/29/2014.

CLINICAL DATA: 72-year-old male with peripheral vascular disease.
Bilateral carotid stenosis on ultrasound, severe on the left.

EXAM:
CT ANGIOGRAPHY NECK
TECHNIQUE: Multidetector CT imaging of the neck was performed using the
standard protocol during bolus administration of intravenous
contrast. Multiplanar CT image reconstructions and MIPs were
obtained to evaluate the vascular anatomy. Carotid stenosis
measurements (when applicable) are obtained utilizing NASCET
criteria, using the distal internal carotid diameter as the
denominator.
CONTRAST:  60mL KV61GE-3JC IOPAMIDOL (KV61GE-3JC) INJECTION 76%

[Series 7: cta neck cta neck (person_name) · axial · 0.58mm/px · z∈[-706,-618]mm · 2 of 133 slices shown]
[im 45/133  soft-tissue]
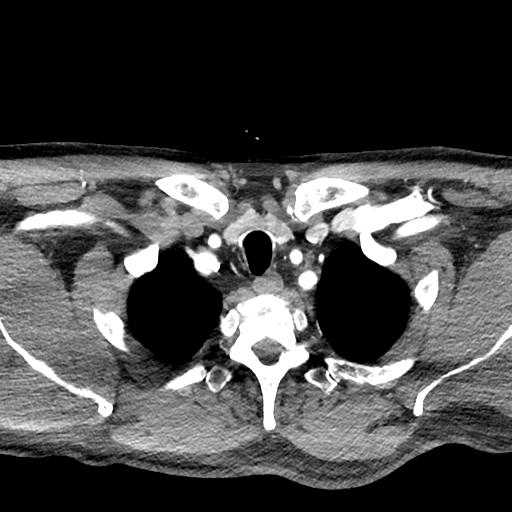
[im 89/133  soft-tissue]
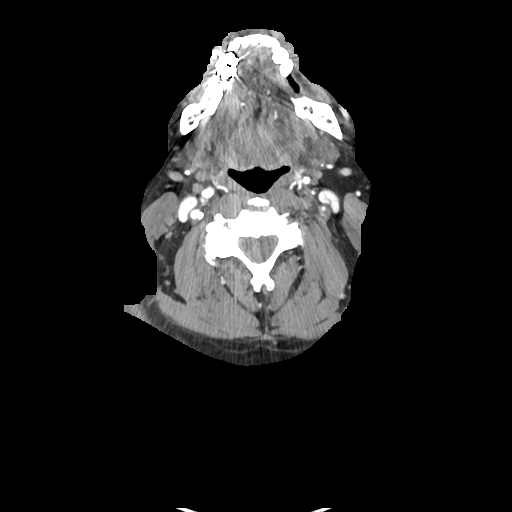

[Series 8: ax thin mips cta neck · axial · 0.58mm/px · z∈[-741,-565]mm · 5 of 267 slices shown]
[im 45/267  soft-tissue]
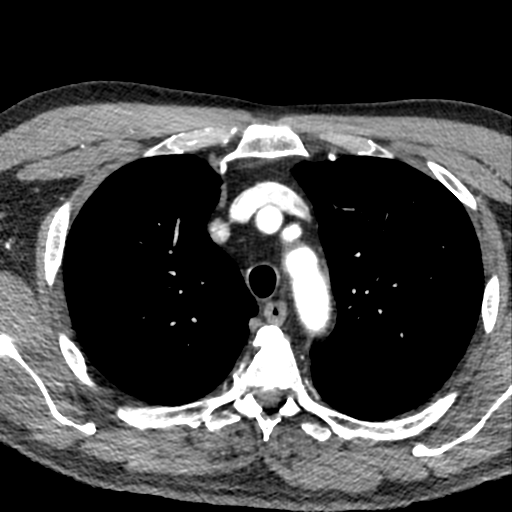
[im 89/267  bone]
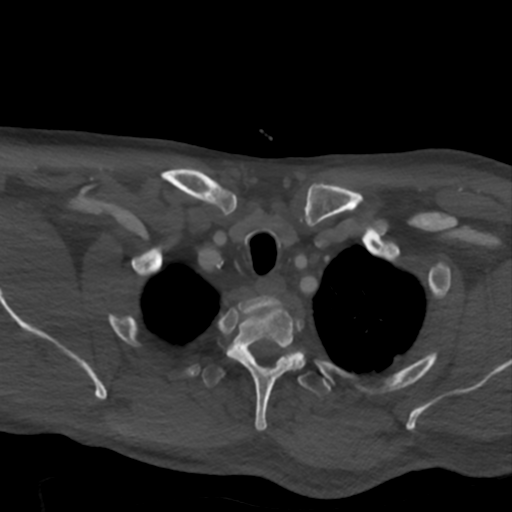
[im 134/267  soft-tissue]
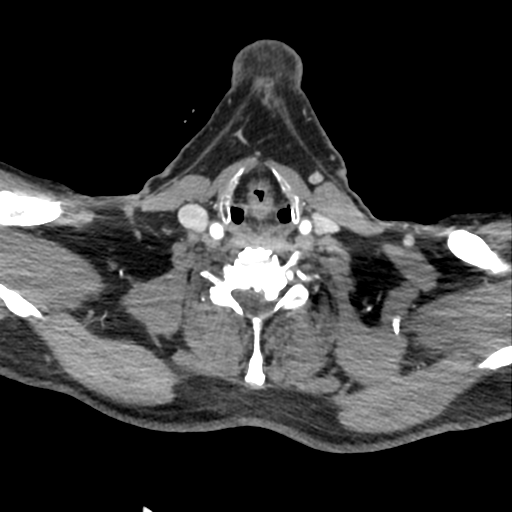
[im 178/267  bone]
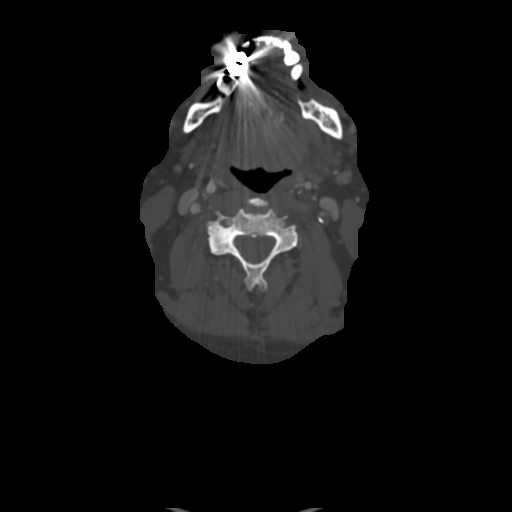
[im 222/267  soft-tissue]
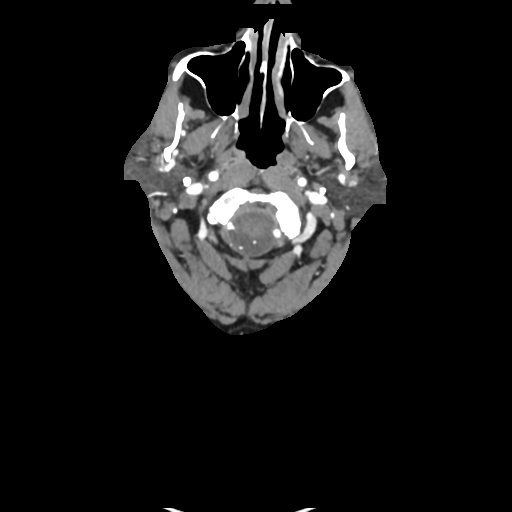

[Series 12: sag thin mips cta neck · sagittal · 0.48mm/px · 3 of 287 slices shown]
[im 61/287  soft-tissue]
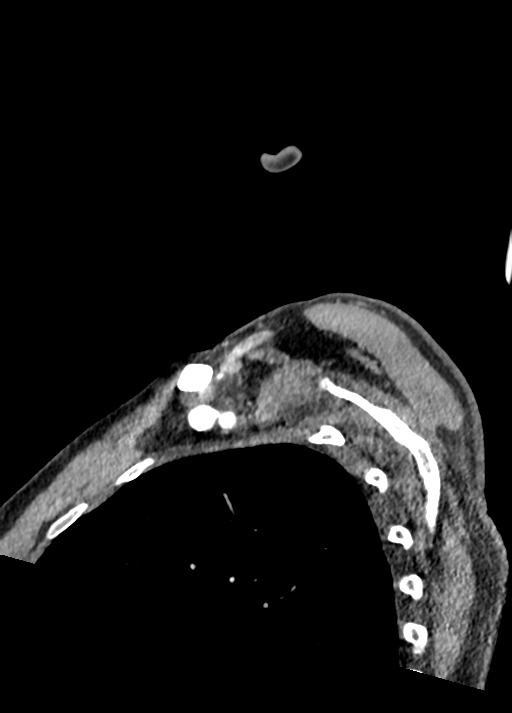
[im 144/287  soft-tissue]
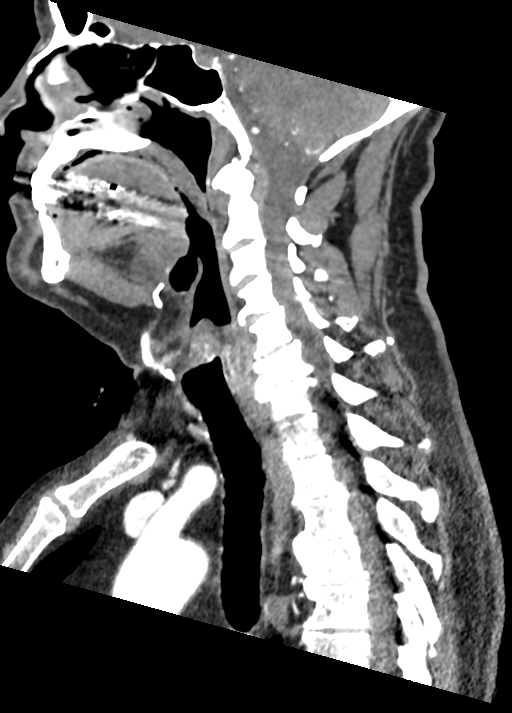
[im 227/287  soft-tissue]
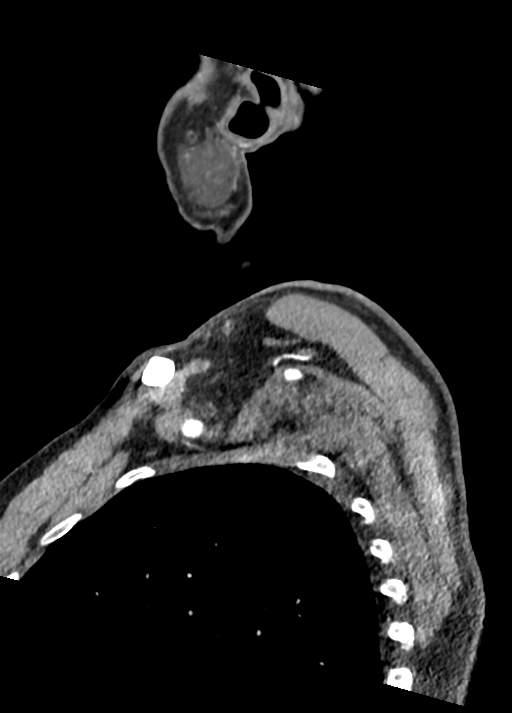

[10 of 35 positions shown; findings below may reference images not displayed]

FINDINGS: Skeleton: Degenerative changes in the visible spine. No acute
osseous abnormality identified.

Visible paranasal sinuses are generally well pneumatized. There is
mucosal thickening and bubbly opacity in the posterior right ethmoid
and left sphenoid. Tympanic cavities and mastoids are clear.

Upper chest: Occasional subpleural reticular opacity in the upper
lungs, subtle distal airway nodularity. This is likely
postinflammatory and there is less pulmonary nodularity than
demonstrated on the 5612 comparison in the mid and lower lungs. No
superior mediastinal lymphadenopathy.

Other neck: Negative Neck. Negative visible brain parenchyma,
orbits.

Aortic arch: 3 vessel arch configuration. Mild for age arch and
proximal great vessel atherosclerosis.

Right carotid system: No brachiocephalic artery stenosis despite
some soft and calcified plaque. Minimal plaque at the right CCA
origin without stenosis. Intermittent soft plaque in the right CCA
without stenosis. Soft and calcified plaque at the right carotid
bifurcation and continuing into the right ICA origin and bulb
results in less than 50 % stenosis with respect to the distal
vessel. Visible right ICA siphon is calcified, with up to 50% right
supraclinoid ICA stenosis on series 8, image 267.

Left carotid system: No left CCA stenosis despite some plaque.
Intermittent soft plaque proximal to the bifurcation without
stenosis. Bulky soft and calcified plaque at the bifurcation and
left ICA origin without stenosis. But separate bulky soft and
calcified plaque results in radiographic string sign stenosis at the
distal bulb as demonstrated on series 12, image 178. The high-grade
stenosis is over a segment of about 4 millimeters. The left ICA
remains patent with a mildly decreased caliber compared to the
opposite side. The visible left ICA siphon is patent with moderate
calcified plaque, and similar appearing visible supraclinoid segment
stenosis on image 267.

Vertebral arteries: Bulky soft plaque in the right subclavian artery
origin with less than 50 % stenosis with respect to the distal
vessel. Calcified plaque at the right vertebral artery origin with
moderate to severe stenosis (series 10, image 120). The right
vertebral is slightly non dominant and patent to the vertebrobasilar
junction without additional stenosis. The right PICA origin is
normal.

The visible basilar artery is patent but diminutive and this appears
to beyond the basis of bilateral fetal type PCA origins.

Proximal left subclavian artery soft and calcified plaque with less
than 50% stenosis. Normal left vertebral artery origin. Mildly
dominant left vertebral artery is patent to the vertebrobasilar
junction without stenosis. Normal left PICA origin.

Review of the MIP images confirms the above findings
IMPRESSION: 1. High-grade RADIOGRAPHIC STRING SIGN stenosis of the Left ICA bulb
from bulky soft and calcified plaque.
The Left ICA remains patent with a mildly decreased caliber compared
to the opposite side.
2. Bulky plaque at the Right carotid bifurcation without significant
stenosis.
3. Note calcified bilateral ICA siphon plaque resulting in
approximately 50% stenosis of both visible supraclinoid ICA
segments.
4. Right Vertebral artery origin moderate to severe stenosis due to
calcified plaque. Negative left vertebral artery.

## 2019-11-01 MED ORDER — AMLODIPINE BESYLATE 2.5 MG PO TABS: 2.5000 mg | ORAL_TABLET | Freq: Every day | ORAL | 6 refills | Status: DC

## 2019-11-01 NOTE — Telephone Encounter (Signed)
Restarting hctz may worsen sugar control.  How are pulse readings? Low pulse may limit increase in metoprolol.  We may need to start 2nd low dose BP med (amlodipine). See if he's agreeable to this (if pulse staying low)  Pulse Readings from Last 3 Encounters:  10/18/19 67  07/17/19 63  06/24/19 70   Lab Results  Component Value Date   HGBA1C 9.4 (A) 10/18/2019

## 2019-11-01 NOTE — Telephone Encounter (Signed)
Pt left v/m requesting cb about BP problem. I left v/m requesting pt to cb.  I called pt at home and pt saw Dr Para March on 10/18/19; pts BS is doing great; FBS 130. HCTZ was stopped; now BP 157/78 P?. No H/A but when bends over and stands up he is dizzy(room does not spin but pt is lightheaded.) the lightheadedness comes and goes and only when he bends over and stands up. for few mins. Pt said BP slipping back up for last few days. 10/30/19 BP 161/78 and 10/31/19 BP 146/78. No CP or SOB. Should pt adjust any meds. Pt is presently taking lisinopril 40 mg taking one daily. Pt is taking metoprolol tartrate 25 mg taking 1/2 tab bid. Pt wonders if should restart the HCTZ. Pt request cb. [facility] [address].

## 2019-11-01 NOTE — Addendum Note (Signed)
Addended by: Eustaquio Boyden on: 11/01/2019 05:28 PM   Modules accepted: Orders

## 2019-11-01 NOTE — Telephone Encounter (Signed)
Spoke with pt/pt's wife, Burna Mortimer, relaying Dr. Timoteo Expose message.  Verbalizes understanding.  Says he has not been keeping up with pulse last few days.  He checked it during call, reading was 61.  Pt agrees to start amlodipine if Dr. Reece Agar deems necessary.

## 2019-11-01 NOTE — Telephone Encounter (Addendum)
I have sent in amlodipine 2.5mg  to pharmacy.  Sent 1 mo supply to ensure tolerated ok. Watch for ankle swelling on med, but should tolerate as it's low dose.

## 2019-11-04 NOTE — Telephone Encounter (Signed)
Spoke with pt/pt's wife, Burna Mortimer, relaying Dr. Timoteo Expose message. Verbalized understanding.

## 2019-11-21 ENCOUNTER — Emergency Department: Payer: Medicare Other

## 2019-11-21 ENCOUNTER — Emergency Department
Admission: EM | Admit: 2019-11-21 | Discharge: 2019-11-21 | Disposition: A | Discharge status: Home / Self Care | Payer: Medicare Other | Attending: Emergency Medicine | Admitting: Emergency Medicine

## 2019-11-21 ENCOUNTER — Other Ambulatory Visit: Payer: Self-pay

## 2019-11-21 ENCOUNTER — Telehealth: Payer: Self-pay | Admitting: Family Medicine

## 2019-11-21 ENCOUNTER — Telehealth: Payer: Self-pay

## 2019-11-21 ENCOUNTER — Ambulatory Visit: Payer: Medicare Other | Admitting: Family Medicine

## 2019-11-21 ENCOUNTER — Encounter: Payer: Self-pay | Admitting: Family Medicine

## 2019-11-21 VITALS — BP 124/62 | HR 73 | Temp 97.5°F | Resp 24 | Ht 70.0 in | Wt 201.2 lb

## 2019-11-21 DIAGNOSIS — Z7982 Long term (current) use of aspirin: Secondary | ICD-10-CM | POA: Diagnosis not present

## 2019-11-21 DIAGNOSIS — E1122 Type 2 diabetes mellitus with diabetic chronic kidney disease: Secondary | ICD-10-CM | POA: Diagnosis not present

## 2019-11-21 DIAGNOSIS — L0592 Pilonidal sinus without abscess: Secondary | ICD-10-CM | POA: Insufficient documentation

## 2019-11-21 DIAGNOSIS — Z87891 Personal history of nicotine dependence: Secondary | ICD-10-CM | POA: Diagnosis not present

## 2019-11-21 DIAGNOSIS — I129 Hypertensive chronic kidney disease with stage 1 through stage 4 chronic kidney disease, or unspecified chronic kidney disease: Secondary | ICD-10-CM | POA: Insufficient documentation

## 2019-11-21 DIAGNOSIS — L0502 Pilonidal sinus with abscess: Secondary | ICD-10-CM | POA: Diagnosis not present

## 2019-11-21 DIAGNOSIS — N189 Chronic kidney disease, unspecified: Secondary | ICD-10-CM | POA: Diagnosis not present

## 2019-11-21 DIAGNOSIS — J449 Chronic obstructive pulmonary disease, unspecified: Secondary | ICD-10-CM | POA: Insufficient documentation

## 2019-11-21 DIAGNOSIS — L03317 Cellulitis of buttock: Secondary | ICD-10-CM | POA: Insufficient documentation

## 2019-11-21 DIAGNOSIS — Z955 Presence of coronary angioplasty implant and graft: Secondary | ICD-10-CM | POA: Diagnosis not present

## 2019-11-21 DIAGNOSIS — I2510 Atherosclerotic heart disease of native coronary artery without angina pectoris: Secondary | ICD-10-CM | POA: Insufficient documentation

## 2019-11-21 DIAGNOSIS — K5730 Diverticulosis of large intestine without perforation or abscess without bleeding: Secondary | ICD-10-CM | POA: Insufficient documentation

## 2019-11-21 DIAGNOSIS — Z794 Long term (current) use of insulin: Secondary | ICD-10-CM | POA: Diagnosis not present

## 2019-11-21 DIAGNOSIS — L0591 Pilonidal cyst without abscess: Secondary | ICD-10-CM | POA: Diagnosis not present

## 2019-11-21 DIAGNOSIS — Z79899 Other long term (current) drug therapy: Secondary | ICD-10-CM | POA: Insufficient documentation

## 2019-11-21 DIAGNOSIS — M545 Low back pain: Secondary | ICD-10-CM | POA: Diagnosis present

## 2019-11-21 LAB — BASIC METABOLIC PANEL
Anion gap: 11 (ref 5–15)
BUN: 20 mg/dL (ref 8–23)
CO2: 25 mmol/L (ref 22–32)
Calcium: 9.3 mg/dL (ref 8.9–10.3)
Chloride: 103 mmol/L (ref 98–111)
Creatinine, Ser: 1.21 mg/dL (ref 0.61–1.24)
GFR calc Af Amer: >60 mL/min (ref >60)
GFR calc non Af Amer: 59 mL/min — ABNORMAL LOW (ref >60)
Glucose, Bld: 125 mg/dL — ABNORMAL HIGH (ref 70–99)
Potassium: 3.9 mmol/L (ref 3.5–5.1)
Sodium: 139 mmol/L (ref 135–145)

## 2019-11-21 LAB — CBC
HCT: 43.1 % (ref 39.0–52.0)
Hemoglobin: 14.6 g/dL (ref 13.0–17.0)
MCH: 30.6 pg (ref 26.0–34.0)
MCHC: 33.9 g/dL (ref 30.0–36.0)
MCV: 90.4 fL (ref 80.0–100.0)
Platelets: 219 10*3/uL (ref 150–400)
RBC: 4.77 MIL/uL (ref 4.22–5.81)
RDW: 14.7 % (ref 11.5–15.5)
WBC: 13.3 10*3/uL — ABNORMAL HIGH (ref 4.0–10.5)
nRBC: 0 % (ref 0.0–0.2)

## 2019-11-21 MED ORDER — SULFAMETHOXAZOLE-TRIMETHOPRIM 800-160 MG PO TABS
1.0000 | ORAL_TABLET | Freq: Once | ORAL | Status: AC
  Administered 2019-11-21: 1 via ORAL
  Filled 2019-11-21: qty 1

## 2019-11-21 MED ORDER — SULFAMETHOXAZOLE-TRIMETHOPRIM 800-160 MG PO TABS
1.0000 | ORAL_TABLET | Freq: Two times a day (BID) | ORAL | 0 refills | Status: DC
Start: 2019-11-21 — End: 2019-11-27

## 2019-11-21 MED ORDER — IOHEXOL 300 MG/ML  SOLN
100.0000 mL | Freq: Once | INTRAMUSCULAR | Status: AC | PRN
  Administered 2019-11-21: 100 mL via INTRAVENOUS
  Filled 2019-11-21: qty 100

## 2019-11-21 MED ORDER — TRAMADOL HCL 50 MG PO TABS: 50.0000 mg | ORAL_TABLET | Freq: Three times a day (TID) | ORAL | 0 refills | Status: AC | PRN

## 2019-11-21 MED ORDER — IOHEXOL 9 MG/ML PO SOLN
1000.0000 mL | Freq: Once | ORAL | Status: AC | PRN
  Administered 2019-11-21: 1000 mL via ORAL
  Filled 2019-11-21: qty 1000

## 2019-11-21 NOTE — Discharge Instructions (Addendum)
You were seen today for a pilonidal cyst with cellulitis.  The CT scan did not show any track or drainable abscess.  I have given you prescription for antibiotics to take twice daily for the next 10 days.  Please apply warm compresses for 10 minutes 3 times a day.  I have given you prescription for pain medicine to take as needed.  Please call [facility] as scheduled an appointment to be seen tomorrow or first thing Monday morning.  Return to the ER for increased pain, swelling, odorous drainage, fever, chills, nausea or vomiting.

## 2019-11-21 NOTE — ED Notes (Signed)
See triage note  Presents with possible large abscess to buttocks   States he noticed some brownish drainage  Afebrile on arrival

## 2019-11-21 NOTE — ED Provider Notes (Signed)
[facility] Emergency Department Provider Note ____________________________________________  Time seen: 1810  I have reviewed the triage vital signs and the nursing notes.  HISTORY  Chief Complaint  Abscess   HPI [name] is a 74 y.o. male presents to the ER with c/o abscess of buttocks. He reports he started having pain, redness and drainage from the area yesterday. He reports the drainage is burgundy, he has not noticed an odor. He denies fever, chills, nausea or low back pain. He is not having any issues passing stool. He was seen at his PCP's office, referred to [facility]. Dr. Perrin Maltese, recommended pt come to the ER for further evaluation. He has had pilonidal cyst drained many years ago. He is diabetic, A1C 9.4%, 10/2019. He has not tried anything OTC for this.  Past Medical History:  Diagnosis Date  . CAD (coronary artery disease)    a. 1999: PCI-->LAD 2/2 MI; b. 2005: inf MI s/p PCI/DES x 4 to RCA; c. Myoview in 12/2005: EF 61%, no evidence for ischemia; d. cath 10/2014: occluded mLAD w/ L to L and L to R collats, dRCA 95% s/p PCI/DES 0%, mPDA 70%, LCx mild to mod irregs, procedure complicated by inf ST ele in recovery, repeat cath showed acute dRCA stent thrombosis o/w occluded mRPDA, PTCA dRCA, PCI/DES RPDA, aggrastat x 18 hr  . CKD (chronic kidney disease)   . COPD (chronic obstructive pulmonary disease) ([place])   . DM2 (diabetes mellitus, type 2) ([place])   . H/O hiatal hernia   . Heart attack ([place]) [phone]   had 2 in one day(released plaque during angio). 5 stents total  . HTN (hypertension)   . Hypercholesterolemia   . Impaired fasting glucose    Elevated after steroid injection  . Macular degeneration of right eye 2020   receiving injections into eye  . Osteoarthritis of hip    injections in both hips  . Osteoarthritis, knee   . PAD (peripheral artery disease) ([facility])    L fem to below the knee popliteal vein bypass graft   . Peri-rectal abscess 12/27/2011  . Tobacco abuse    Prior    Patient Active Problem List   Diagnosis Date Noted  . Aortic ectasia, abdominal ([place]) 07/17/2019  . Lower abdominal pain 06/27/2019  . Carotid artery stenosis without cerebral infarction, left 09/21/2018  . Abnormal dreams 08/05/2018  . Creatinine elevation 08/05/2018  . Dysuria 01/14/2018  . CAD (coronary artery disease), native coronary artery 07/16/2017  . Shoulder pain 12/30/2016  . Skin lesion 12/30/2016  . Unstable angina ([place]) 09/03/2016  . AAA (abdominal aortic aneurysm) without rupture ([place]) 07/13/2016  . Popliteal artery aneurysm ([place]) 07/13/2016  . Carotid artery disease ([place]) 01/30/2015  . NSTEMI (non-ST elevated myocardial infarction) ([place]) 11/16/2014  . Medicare annual wellness visit, initial 10/21/2014  . Advance care planning 10/21/2014  . PAD (peripheral artery disease) ([place]) 09/09/2014  . COPD exacerbation ([place]) 08/21/2014  . DM (diabetes mellitus), type 2 with peripheral vascular complications ([place]) 53/29/9242  . Back pain 02/24/2013  . Cough 01/26/2011  . COPD (chronic obstructive pulmonary disease) ([place]) 12/26/2010  . FATIGUE 05/11/2009  . Essential hypertension 05/08/2009  . Hyperlipidemia 06/20/2008    Past Surgical History:  Procedure Laterality Date  . CORONARY ANGIOPLASTY    . [place], 2006, 2016 x 2   Multiple, LAD in 1999, RCA in 2005.  has a total of 4 stents  . ENDARTERECTOMY Left 09/21/2018   Procedure:  ENDARTERECTOMY CAROTID;  Surgeon: Katha Cabal, MD;  Location: [facility] ORS;  Service: Vascular;  Laterality: Left;  . FEMORAL-POPLITEAL BYPASS GRAFT  2016   L fem to below the knee popliteal vein bypass graft  . INCISE AND DRAIN ABCESS  2007   on scrotum  . INCISION AND DRAINAGE PERIRECTAL ABSCESS  12/28/2011   Procedure: IRRIGATION AND DEBRIDEMENT PERIRECTAL ABSCESS;  Surgeon: Merrie Roof, MD;  Location: [place];  Service: General;  Laterality: N/A;     Prior to Admission medications   Medication Sig Start Date End Date Taking? Authorizing Provider  acetaminophen (TYLENOL) 325 MG tablet Take 1-2 tablets (325-650 mg total) by mouth every 4 (four) hours as needed for mild pain (or temp >/= 101 F). 09/22/18   Juanna Cao, MD  albuterol (PROAIR HFA) 108 (90 BASE) MCG/ACT inhaler Inhale 2 puffs into the lungs 3 (three) times daily as needed (cough). 04/17/13   Tonia Ghent, MD  amLODipine (NORVASC) 2.5 MG tablet Take 1 tablet (2.5 mg total) by mouth daily. 11/01/19   Ria Bush, MD  aspirin 81 MG EC tablet Take 1 tablet (81 mg total) by mouth daily. 11/19/14   Minna Merritts, MD  atorvastatin (LIPITOR) 40 MG tablet TAKE 1 TABLET BY MOUTH EVERY DAY 04/23/19   Minna Merritts, MD  blood glucose meter kit and supplies KIT 1 each by Does not apply route daily as needed. Dispense based on patient and insurance preference. Use up to four times daily as directed. (FOR ICD-9 250.00, 250.01). 01/12/18   Tonia Ghent, MD  BRILINTA 60 MG TABS tablet TAKE 1 TABLET BY MOUTH TWICE DAILY 09/06/19   Minna Merritts, MD  fenofibrate (TRICOR) 145 MG tablet TAKE 1 TABLET(145 MG) BY MOUTH DAILY 04/29/19   Minna Merritts, MD  Fluticasone-Salmeterol (ADVAIR DISKUS) 250-50 MCG/DOSE AEPB Inhale 1 puff into the lungs 2 (two) times daily. 08/09/19   Tonia Ghent, MD  glipiZIDE (GLUCOTROL) 5 MG tablet Take 1 tablet (5 mg total) by mouth 2 (two) times daily before a meal. 08/20/19   Tonia Ghent, MD  glucosamine-chondroitin 500-400 MG tablet Take 1 tablet by mouth 2 (two) times daily.    [provider]  glucose blood (ONETOUCH ULTRA) test strip Check sugar 2 times daily DX E11.9 with h/o blood sugar variation 10/25/19   Tonia Ghent, MD  insulin glargine (LANTUS SOLOSTAR) 100 UNIT/ML Solostar Pen ADMINISTER UP TO 45 UNITS UNDER THE SKIN DAILY 10/18/19   Tonia Ghent, MD  Insulin Pen Needle (B-D UF III MINI PEN NEEDLES) 31G X 5 MM MISC  Use as instructed to inject insulin.  Diagnosis:  250.00  Insulin dependent. 06/24/19   Tonia Ghent, MD  lisinopril (ZESTRIL) 40 MG tablet TAKE 1 TABLET BY MOUTH EVERY DAY 10/04/19   Minna Merritts, MD  loperamide (IMODIUM A-D) 2 MG tablet Take 1 tablet (2 mg total) by mouth 3 (three) times daily as needed for diarrhea or loose stools. 05/24/19   Tonia Ghent, MD  meclizine (ANTIVERT) 25 MG tablet Take 25 mg by mouth every 6 (six) hours as needed. For dizziness/vertigo    [provider]  metFORMIN (GLUCOPHAGE) 500 MG tablet Take 1 tablet (500 mg total) by mouth 2 (two) times daily with a meal. 10/18/19   Tonia Ghent, MD  metoprolol tartrate (LOPRESSOR) 25 MG tablet TAKE 1/2 TABLET BY MOUTH TWICE DAILY 08/26/19   Minna Merritts, MD  Multiple Vitamins-Minerals (PRESERVISION AREDS 2 PO) Take 1 tablet by mouth 2 (two) times daily.    [provider]  nitroGLYCERIN (NITROSTAT) 0.4 MG SL tablet Place 1 tablet (0.4 mg total) under the tongue every 5 (five) minutes as needed. For chest pain 10/25/19   Tonia Ghent, MD  sulfamethoxazole-trimethoprim (BACTRIM DS) 800-160 MG tablet Take 1 tablet by mouth 2 (two) times daily. 11/21/19   Jearld Fenton, NP  traMADol (ULTRAM) 50 MG tablet Take 1 tablet (50 mg total) by mouth 3 (three) times daily as needed. 11/21/19 11/20/20  Jearld Fenton, NP    Allergies Augmentin [amoxicillin-pot clavulanate] and Morphine and related  Family History  Problem Relation Age of Onset  . Heart failure Father        CHF  . CAD Mother   . Diabetes Mother   . Colon cancer Neg Hx   . Prostate cancer Neg Hx     Social History Social History   Tobacco Use  . Smoking status: Former Smoker    Packs/day: 1.00    Years: 40.00    Pack years: 40.00    Types: Cigarettes    Quit date: 10/21/1998    Years since quitting: 21.0  . Smokeless tobacco: Never Used  . Tobacco comment: Past smoker, states he will smoke maybe 1 cigarette/ month or  so still  Substance Use Topics  . Alcohol use: Not Currently    Alcohol/week: 0.0 standard drinks    Comment: Occasional beer on the weekends  . Drug use: No    Review of Systems  Constitutional: Negative for fever, chills or body aches. Cardiovascular: Negative for chest pain or chest tightness. Respiratory: Negative for cough or shortness of breath. Gastrointestinal: Negative for abdominal pain, nausea, vomiting, diarrhea or difficulty passing stool. Skin: Positive for abscess of buttock. Neurological: Negative for headaches, focal weakness or numbness. ____________________________________________  PHYSICAL EXAM:  VITAL SIGNS: ED Triage Vitals  Enc Vitals Group     BP 11/21/19 1617 136/60     Pulse Rate 11/21/19 1613 74     Resp 11/21/19 1613 (!) 22     Temp 11/21/19 1613 98.6 F (37 C)     Temp Source 11/21/19 1613 Oral     SpO2 11/21/19 1613 98 %     Weight 11/21/19 1616 201 lb 1 oz (91.2 kg)     Height 11/21/19 1616 5' 10"  (1.778 m)     Head Circumference --      Peak Flow --      Pain Score 11/21/19 1615 8     Pain Loc --      Pain Edu? --      Excl. in [place]? --     Constitutional: Alert and oriented. Obese, appears in pain but in no distress. Cardiovascular: Normal rate, regular rhythm.  Respiratory: Normal respiratory effort. Intermittent expiratory wheeze noted. Gastrointestinal: Soft and nontender. No distention. Neurologic:  Normal gait without ataxia. Normal speech and language. No gross focal neurologic deficits are appreciated. Skin:  Pilonidal sinus noted to the left of the gluteal cleft, open with sanguinous drainage. Fluctuance noted surrounding the sinus. 4 cm x 2 cm area of surrounding cellulitis.   ____________________________________________   LABS  Labs Reviewed  CBC - Abnormal; Notable for the following components:      Result Value   WBC 13.3 (*)    All other components within normal limits  BASIC METABOLIC PANEL - Abnormal; Notable for the  following components:  Glucose, Bld 125 (*)    GFR calc non Af Amer 59 (*)    All other components within normal limits    ____________________________________________  RADIOLOGY  Imaging Orders     CT PELVIS W CONTRAST IMPRESSION:  Phlegmonous area in the medial left buttock subcutaneous soft  tissues measuring 4.5 x 1.7 cm with central gas concerning for  developing abscess. No drainable fluid noted at this time.    Sigmoid diverticulosis.    Aortoiliac atherosclerosis.    ____________________________________________    INITIAL IMPRESSION / ASSESSMENT AND PLAN / ED COURSE  Pilonidal Sinus with Surrounding Cellulitis:  CT scan shows no evidence of drainable abscess Septra DS 1 tab PO x 1 RX for Septra  DS 1 tab PO BID x 10 days Warm compresses for 10 minutes TID RX for Tramadol 50 mg PO Q8H  Prn pain Follow up with [facility] as an outpatient    I reviewed the patient's prescription history over the last 12 months in the multi-state controlled substances database(s) that includes [place], [place], [place], [place], [place], [place], [place], [place], [place], [place], [place], [place], [place], and [place].  Results were notable for Tramadol 50 mg 11/2018. ____________________________________________  FINAL CLINICAL IMPRESSION(S) / ED DIAGNOSES  Final diagnoses:  Pilonidal sinus without abscess  Cellulitis of buttock      Jearld Fenton, NP 11/21/19 2042    Duffy Bruce, MD 11/22/19 2047

## 2019-11-21 NOTE — Telephone Encounter (Signed)
Pt calling in requesting an OV today with Dr Para March.  Pt was offered other appts with another Provider, pt declined stating that he only wanted to see Dr Para March.   Pt has a recurrent abscess on his left buttock, measuring about 3" x 1"  Pt is concerned of recurrence of MRSA or another type of infection. Pt feels if this waits it will progress into something more over night. Very painful to sit and have clothes touching.   Please advise, thanks.

## 2019-11-21 NOTE — Telephone Encounter (Signed)
I have severely limited availability due to mult recent family emergencies.  Please see if anyone else has availability today.  Thanks.

## 2019-11-21 NOTE — Telephone Encounter (Signed)
See visit from today. Urgent Gen Surg referral

## 2019-11-21 NOTE — Progress Notes (Signed)
   Subjective:     RIDER ERMIS is a 74 y.o. male presenting for Abscess (started yesterday-11/20/19, left buttocks are-painful)     HPI    #Abscess - had one several years ago - told it may come back  - started yesterday and couldn't even sit down - already draining some on its own - no fever/chills - pain is the same - worse with sitting  Review of Systems   Social History   Tobacco Use  Smoking Status Former Smoker  . Packs/day: 1.00  . Years: 40.00  . Pack years: 40.00  . Types: Cigarettes  . Quit date: 10/21/1998  . Years since quitting: 21.0  Smokeless Tobacco Never Used  Tobacco Comment   Past smoker, states he will smoke maybe 1 cigarette/ month or so still        Objective:    BP Readings from Last 3 Encounters:  11/21/19 124/62  10/18/19 (!) 98/52  07/17/19 120/76   Wt Readings from Last 3 Encounters:  11/21/19 201 lb 3 oz (91.3 kg)  10/18/19 198 lb 5 oz (90 kg)  07/17/19 201 lb (91.2 kg)    BP 124/62   Pulse 73   Temp (!) 97.5 F (36.4 C)   Resp (!) 24   Ht 5\' 10"  (1.778 m)   Wt 201 lb 3 oz (91.3 kg)   SpO2 98%   BMI 28.87 kg/m    Physical Exam Constitutional:      Appearance: Normal appearance. He is not ill-appearing or diaphoretic.  HENT:     Right Ear: External ear normal.     Left Ear: External ear normal.  Eyes:     General: No scleral icterus.    Extraocular Movements: Extraocular movements intact.     Conjunctiva/sclera: Conjunctivae normal.  Cardiovascular:     Rate and Rhythm: Normal rate.  Pulmonary:     Effort: Pulmonary effort is normal.  Musculoskeletal:     Cervical back: Neck supple.  Skin:    General: Skin is warm and dry.     Comments: Area of erythema with fluctuance noted between the gluteal fold with tract towards the rectum and above the fold  Neurological:     Mental Status: He is alert. Mental status is at baseline.  Psychiatric:        Mood and Affect: Mood normal.           Assessment &  Plan:   Problem List Items Addressed This Visit    None    Visit Diagnoses    Pilonidal sinus with abscess    -  Primary   Relevant Orders   Ambulatory referral to General Surgery     Pt with hx of prior pilonidal abscess which was operated on in the ER. Given presentation with history and concern for tract extending towards the rectum recommend Gen Surgery for urgent evaluation and drainage of the cyst.   Return if symptoms worsen or fail to improve.  , MD

## 2019-11-21 NOTE — ED Triage Notes (Signed)
Pt c/o "buttcheek problem". Pt stated he believes he has a large cyst on L buttocks. Pt states drainage from area that is "reddish brown". Denies fever.  Pt states pain 8/10.

## 2019-11-21 NOTE — Telephone Encounter (Signed)
I spoke with [name] and Dr Para March did advise he is not able to see [name] today and would recommend seeing another provider. Dr Selena Batten agreed to see [name] today 57' in office appt and [name] can be here at 2:10pm and Dr Selena Batten said OK. [name] has no covid symptoms, no travel and no known exposure to + covid. [name] wife said the 3" x 1 " area is over the scar tissue at rectum where had surgery probably 10 yrs ago; does not remember surgeons name. [name] is on his way to office FYI to Dr Selena Batten.

## 2019-11-21 NOTE — Telephone Encounter (Signed)
Contacted [facility] for pilonidal cyst .  Per their office pt needs to go to ED.  Pt is diabetic and may require surgical care beyond what could be done in office.  Their MD, Dr. Valaria Good is on call today.  They sent him a message about pt.  Pt will go directly to [facility] ED.  Instructed pt not to eat or drink anything until seen by MD.  Pt agrees.

## 2019-11-21 NOTE — Telephone Encounter (Signed)
Appreciate their advice and support. Agree with plan

## 2019-11-21 NOTE — Patient Instructions (Signed)
We think you need to see a Careers adviser.   We will try to get you scheduled ASAP  Go to the ER if worsening pain, swelling, or fever or chills

## 2019-11-22 ENCOUNTER — Emergency Department
Admission: EM | Admit: 2019-11-22 | Discharge: 2019-11-22 | Disposition: A | Discharge status: Home / Self Care | Payer: Medicare Other | Attending: Emergency Medicine | Admitting: Emergency Medicine

## 2019-11-22 ENCOUNTER — Other Ambulatory Visit: Payer: Self-pay

## 2019-11-22 DIAGNOSIS — I2510 Atherosclerotic heart disease of native coronary artery without angina pectoris: Secondary | ICD-10-CM | POA: Insufficient documentation

## 2019-11-22 DIAGNOSIS — L0501 Pilonidal cyst with abscess: Secondary | ICD-10-CM | POA: Diagnosis not present

## 2019-11-22 DIAGNOSIS — Z794 Long term (current) use of insulin: Secondary | ICD-10-CM | POA: Insufficient documentation

## 2019-11-22 DIAGNOSIS — Z955 Presence of coronary angioplasty implant and graft: Secondary | ICD-10-CM | POA: Insufficient documentation

## 2019-11-22 DIAGNOSIS — J449 Chronic obstructive pulmonary disease, unspecified: Secondary | ICD-10-CM | POA: Insufficient documentation

## 2019-11-22 DIAGNOSIS — R222 Localized swelling, mass and lump, trunk: Secondary | ICD-10-CM | POA: Diagnosis present

## 2019-11-22 DIAGNOSIS — Z79899 Other long term (current) drug therapy: Secondary | ICD-10-CM | POA: Insufficient documentation

## 2019-11-22 DIAGNOSIS — Z87891 Personal history of nicotine dependence: Secondary | ICD-10-CM | POA: Insufficient documentation

## 2019-11-22 DIAGNOSIS — E119 Type 2 diabetes mellitus without complications: Secondary | ICD-10-CM | POA: Insufficient documentation

## 2019-11-22 LAB — COMPREHENSIVE METABOLIC PANEL
ALT: 12 U/L (ref 0–44)
AST: 14 U/L — ABNORMAL LOW (ref 15–41)
Albumin: 3.8 g/dL (ref 3.5–5.0)
Alkaline Phosphatase: 41 U/L (ref 38–126)
Anion gap: 9 (ref 5–15)
BUN: 18 mg/dL (ref 8–23)
CO2: 24 mmol/L (ref 22–32)
Calcium: 9.1 mg/dL (ref 8.9–10.3)
Chloride: 103 mmol/L (ref 98–111)
Creatinine, Ser: 1.41 mg/dL — ABNORMAL HIGH (ref 0.61–1.24)
GFR calc Af Amer: 56 mL/min — ABNORMAL LOW (ref >60)
GFR calc non Af Amer: 49 mL/min — ABNORMAL LOW (ref >60)
Glucose, Bld: 135 mg/dL — ABNORMAL HIGH (ref 70–99)
Potassium: 3.9 mmol/L (ref 3.5–5.1)
Sodium: 136 mmol/L (ref 135–145)
Total Bilirubin: 1.6 mg/dL — ABNORMAL HIGH (ref 0.3–1.2)
Total Protein: 7.1 g/dL (ref 6.5–8.1)

## 2019-11-22 LAB — CBC WITH DIFFERENTIAL/PLATELET
Abs Immature Granulocytes: 0.07 10*3/uL (ref 0.00–0.07)
Basophils Absolute: 0.1 10*3/uL (ref 0.0–0.1)
Basophils Relative: 0 %
Eosinophils Absolute: 0.1 10*3/uL (ref 0.0–0.5)
Eosinophils Relative: 0 %
HCT: 42.2 % (ref 39.0–52.0)
Hemoglobin: 14.5 g/dL (ref 13.0–17.0)
Immature Granulocytes: 0 %
Lymphocytes Relative: 10 %
Lymphs Abs: 1.6 10*3/uL (ref 0.7–4.0)
MCH: 31 pg (ref 26.0–34.0)
MCHC: 34.4 g/dL (ref 30.0–36.0)
MCV: 90.4 fL (ref 80.0–100.0)
Monocytes Absolute: 1.5 10*3/uL — ABNORMAL HIGH (ref 0.1–1.0)
Monocytes Relative: 10 %
Neutro Abs: 12.5 10*3/uL — ABNORMAL HIGH (ref 1.7–7.7)
Neutrophils Relative %: 80 %
Platelets: 219 10*3/uL (ref 150–400)
RBC: 4.67 MIL/uL (ref 4.22–5.81)
RDW: 14.5 % (ref 11.5–15.5)
WBC: 15.8 10*3/uL — ABNORMAL HIGH (ref 4.0–10.5)
nRBC: 0 % (ref 0.0–0.2)

## 2019-11-22 MED ORDER — TRAMADOL HCL 50 MG PO TABS
50.0000 mg | ORAL_TABLET | Freq: Once | ORAL | Status: AC
  Administered 2019-11-22: 50 mg via ORAL
  Filled 2019-11-22: qty 1

## 2019-11-22 NOTE — Discharge Instructions (Signed)
Call Monday to make an appointment with the surgeon that is listed on your discharge papers.  Begin taking the antibiotics and the tramadol that was sent to your pharmacy.  It is important that you do sits baths 2-3 times a day if it is possible for you to get in a bathtub safely.  You may also use warm wet compresses to the area.  Currently the area is draining.  You may also want to get some pads to put inside your underwear for this.  Should the area become soft like a [place] return to the emergency department over the weekend where we can open it.  At this point on your CT scan there does not show a fluid area that can be drained.

## 2019-11-22 NOTE — Telephone Encounter (Signed)
Noted. Thanks.

## 2019-11-22 NOTE — ED Provider Notes (Signed)
[facility] Emergency Department Provider Note  ____________________________________________   First MD Initiated Contact with Patient 11/22/19 1133     (approximate)  I have reviewed the triage vital signs and the nursing notes.   HISTORY  Chief Complaint Cyst   HPI [name] is a 74 y.o. male presents to the ED with possible pilonidal cyst.  Patient states he was seen in the ED yesterday and was given 1 pill of an antibiotic.  He states that he woke this morning and had an oral temp of 101.  He denies taking any antipyretics prior to his arrival in the ED.  Patient reports some drainage today.  He rates his pain at 8 out of 10.    Past Medical History:  Diagnosis Date  . CAD (coronary artery disease)    a. 1999: PCI-->LAD 2/2 MI; b. 2005: inf MI s/p PCI/DES x 4 to RCA; c. Myoview in 12/2005: EF 61%, no evidence for ischemia; d. cath 10/2014: occluded mLAD w/ L to L and L to R collats, dRCA 95% s/p PCI/DES 0%, mPDA 70%, LCx mild to mod irregs, procedure complicated by inf ST ele in recovery, repeat cath showed acute dRCA stent thrombosis o/w occluded mRPDA, PTCA dRCA, PCI/DES RPDA, aggrastat x 18 hr  . CKD (chronic kidney disease)   . COPD (chronic obstructive pulmonary disease) ([place])   . DM2 (diabetes mellitus, type 2) ([place])   . H/O hiatal hernia   . Heart attack ([place]) [phone]   had 2 in one day(released plaque during angio). 5 stents total  . HTN (hypertension)   . Hypercholesterolemia   . Impaired fasting glucose    Elevated after steroid injection  . Macular degeneration of right eye 2020   receiving injections into eye  . Osteoarthritis of hip    injections in both hips  . Osteoarthritis, knee   . PAD (peripheral artery disease) ([facility])    L fem to below the knee popliteal vein bypass graft  . Peri-rectal abscess 12/27/2011  . Tobacco abuse    Prior    Patient Active Problem List   Diagnosis Date Noted  . Aortic ectasia,  abdominal ([place]) 07/17/2019  . Lower abdominal pain 06/27/2019  . Carotid artery stenosis without cerebral infarction, left 09/21/2018  . Abnormal dreams 08/05/2018  . Creatinine elevation 08/05/2018  . Dysuria 01/14/2018  . CAD (coronary artery disease), native coronary artery 07/16/2017  . Shoulder pain 12/30/2016  . Skin lesion 12/30/2016  . Unstable angina ([place]) 09/03/2016  . AAA (abdominal aortic aneurysm) without rupture ([place]) 07/13/2016  . Popliteal artery aneurysm ([place]) 07/13/2016  . Carotid artery disease ([place]) 01/30/2015  . NSTEMI (non-ST elevated myocardial infarction) ([place]) 11/16/2014  . Medicare annual wellness visit, initial 10/21/2014  . Advance care planning 10/21/2014  . PAD (peripheral artery disease) ([place]) 09/09/2014  . COPD exacerbation ([place]) 08/21/2014  . DM (diabetes mellitus), type 2 with peripheral vascular complications ([place]) 33/35/4562  . Back pain 02/24/2013  . Cough 01/26/2011  . COPD (chronic obstructive pulmonary disease) ([place]) 12/26/2010  . FATIGUE 05/11/2009  . Essential hypertension 05/08/2009  . Hyperlipidemia 06/20/2008    Past Surgical History:  Procedure Laterality Date  . CORONARY ANGIOPLASTY    . [place], 2006, 2016 x 2   Multiple, LAD in 1999, RCA in 2005.  has a total of 4 stents  . ENDARTERECTOMY Left 09/21/2018   Procedure: ENDARTERECTOMY CAROTID;  Surgeon: Katha Cabal, MD;  Location: [facility] ORS;  Service: Vascular;  Laterality: Left;  . FEMORAL-POPLITEAL BYPASS GRAFT  2016   L fem to below the knee popliteal vein bypass graft  . INCISE AND DRAIN ABCESS  2007   on scrotum  . INCISION AND DRAINAGE PERIRECTAL ABSCESS  12/28/2011   Procedure: IRRIGATION AND DEBRIDEMENT PERIRECTAL ABSCESS;  Surgeon: Merrie Roof, MD;  Location: [place];  Service: General;  Laterality: N/A;    Prior to Admission medications   Medication Sig Start Date End Date Taking? Authorizing Provider  acetaminophen (TYLENOL) 325 MG tablet  Take 1-2 tablets (325-650 mg total) by mouth every 4 (four) hours as needed for mild pain (or temp >/= 101 F). 09/22/18   Juanna Cao, MD  albuterol (PROAIR HFA) 108 (90 BASE) MCG/ACT inhaler Inhale 2 puffs into the lungs 3 (three) times daily as needed (cough). 04/17/13   Tonia Ghent, MD  amLODipine (NORVASC) 2.5 MG tablet Take 1 tablet (2.5 mg total) by mouth daily. 11/01/19   Ria Bush, MD  aspirin 81 MG EC tablet Take 1 tablet (81 mg total) by mouth daily. 11/19/14   Minna Merritts, MD  atorvastatin (LIPITOR) 40 MG tablet TAKE 1 TABLET BY MOUTH EVERY DAY 04/23/19   Minna Merritts, MD  blood glucose meter kit and supplies KIT 1 each by Does not apply route daily as needed. Dispense based on patient and insurance preference. Use up to four times daily as directed. (FOR ICD-9 250.00, 250.01). 01/12/18   Tonia Ghent, MD  BRILINTA 60 MG TABS tablet TAKE 1 TABLET BY MOUTH TWICE DAILY 09/06/19   Minna Merritts, MD  fenofibrate (TRICOR) 145 MG tablet TAKE 1 TABLET(145 MG) BY MOUTH DAILY 04/29/19   Minna Merritts, MD  Fluticasone-Salmeterol (ADVAIR DISKUS) 250-50 MCG/DOSE AEPB Inhale 1 puff into the lungs 2 (two) times daily. 08/09/19   Tonia Ghent, MD  glipiZIDE (GLUCOTROL) 5 MG tablet Take 1 tablet (5 mg total) by mouth 2 (two) times daily before a meal. 08/20/19   Tonia Ghent, MD  glucosamine-chondroitin 500-400 MG tablet Take 1 tablet by mouth 2 (two) times daily.    [provider]  glucose blood (ONETOUCH ULTRA) test strip Check sugar 2 times daily DX E11.9 with h/o blood sugar variation 10/25/19   Tonia Ghent, MD  insulin glargine (LANTUS SOLOSTAR) 100 UNIT/ML Solostar Pen ADMINISTER UP TO 45 UNITS UNDER THE SKIN DAILY 10/18/19   Tonia Ghent, MD  Insulin Pen Needle (B-D UF III MINI PEN NEEDLES) 31G X 5 MM MISC Use as instructed to inject insulin.  Diagnosis:  250.00  Insulin dependent. 06/24/19   Tonia Ghent, MD  lisinopril (ZESTRIL) 40 MG tablet  TAKE 1 TABLET BY MOUTH EVERY DAY 10/04/19   Minna Merritts, MD  loperamide (IMODIUM A-D) 2 MG tablet Take 1 tablet (2 mg total) by mouth 3 (three) times daily as needed for diarrhea or loose stools. 05/24/19   Tonia Ghent, MD  meclizine (ANTIVERT) 25 MG tablet Take 25 mg by mouth every 6 (six) hours as needed. For dizziness/vertigo    [provider]  metFORMIN (GLUCOPHAGE) 500 MG tablet Take 1 tablet (500 mg total) by mouth 2 (two) times daily with a meal. 10/18/19   Tonia Ghent, MD  metoprolol tartrate (LOPRESSOR) 25 MG tablet TAKE 1/2 TABLET BY MOUTH TWICE DAILY 08/26/19   Minna Merritts, MD  Multiple Vitamins-Minerals (PRESERVISION AREDS 2 PO) Take 1 tablet by mouth 2 (two)  times daily.    [provider]  nitroGLYCERIN (NITROSTAT) 0.4 MG SL tablet Place 1 tablet (0.4 mg total) under the tongue every 5 (five) minutes as needed. For chest pain 10/25/19   Tonia Ghent, MD  sulfamethoxazole-trimethoprim (BACTRIM DS) 800-160 MG tablet Take 1 tablet by mouth 2 (two) times daily. 11/21/19   Jearld Fenton, NP  traMADol (ULTRAM) 50 MG tablet Take 1 tablet (50 mg total) by mouth 3 (three) times daily as needed. 11/21/19 11/20/20  Jearld Fenton, NP    Allergies Augmentin [amoxicillin-pot clavulanate] and Morphine and related  Family History  Problem Relation Age of Onset  . Heart failure Father        CHF  . CAD Mother   . Diabetes Mother   . Colon cancer Neg Hx   . Prostate cancer Neg Hx     Social History Social History   Tobacco Use  . Smoking status: Former Smoker    Packs/day: 1.00    Years: 40.00    Pack years: 40.00    Types: Cigarettes    Quit date: 10/21/1998    Years since quitting: 21.1  . Smokeless tobacco: Never Used  . Tobacco comment: Past smoker, states he will smoke maybe 1 cigarette/ month or so still  Substance Use Topics  . Alcohol use: Not Currently    Alcohol/week: 0.0 standard drinks    Comment: Occasional beer on the weekends    . Drug use: No    Review of Systems Constitutional: No fever/chills Cardiovascular: Denies chest pain. Respiratory: Denies shortness of breath. Gastrointestinal: No abdominal pain.  No nausea, no vomiting.  Musculoskeletal: Negative for muscle aches. Skin: Positive for pilonidal abscess. Neurological: Negative for headaches, focal weakness or numbness.  ____________________________________________   PHYSICAL EXAM:  VITAL SIGNS: ED Triage Vitals  Enc Vitals Group     BP 11/22/19 0950 104/73     Pulse Rate 11/22/19 0950 72     Resp 11/22/19 0950 16     Temp 11/22/19 0950 98.5 F (36.9 C)     Temp Source 11/22/19 0950 Oral     SpO2 11/22/19 0950 94 %     Weight 11/22/19 0951 201 lb 1 oz (91.2 kg)     Height 11/22/19 0951 5' 10"  (1.778 m)     Head Circumference --      Peak Flow --      Pain Score 11/22/19 0951 8     Pain Loc --      Pain Edu? --      Excl. in [place]? --    Constitutional: Alert and oriented. Well appearing and in no acute distress. Eyes: Conjunctivae are normal. PERRL. EOMI. Head: Atraumatic. Neck: No stridor.   Cardiovascular: Normal rate, regular rhythm. Grossly normal heart sounds.  Good peripheral circulation. Respiratory: Normal respiratory effort.  No retractions. Lungs CTAB. Gastrointestinal: Soft and nontender.  Musculoskeletal: Moves upper and lower extremities that any difficulty. Neurologic:  Normal speech and language. No gross focal neurologic deficits are appreciated. No gait instability. Skin:  Skin is warm.  Pilonidal area there is a localized, erythematous, firm area with erythema.  Area currently is draining. Psychiatric: Mood and affect are normal. Speech and behavior are normal.  ____________________________________________   LABS (all labs ordered are listed, but only abnormal results are displayed)  Labs Reviewed  COMPREHENSIVE METABOLIC PANEL - Abnormal; Notable for the following components:      Result Value   Glucose, Bld  135 (*)  Creatinine, Ser 1.41 (*)    AST 14 (*)    Total Bilirubin 1.6 (*)    GFR calc non Af Amer 49 (*)    GFR calc Af Amer 56 (*)    All other components within normal limits  CBC WITH DIFFERENTIAL/PLATELET - Abnormal; Notable for the following components:   WBC 15.8 (*)    Neutro Abs 12.5 (*)    Monocytes Absolute 1.5 (*)    All other components within normal limits    RADIOLOGY   Official radiology report(s):  CT scan from yesterday was reviewed.  Per radiologist there is not area left buttocks measuring 4.5 x 1.7 cm for a developing abscess but no drainable fluid was noted.   ____________________________________________   PROCEDURES  Procedure(s) performed (including Critical Care):  Procedures   ____________________________________________   INITIAL IMPRESSION / ASSESSMENT AND PLAN / ED COURSE  As part of my medical decision making, I reviewed the following data within the electronic MEDICAL RECORD NUMBER   74 year old male presents to the ED with a pilonidal abscess that was noted on CT not to have any drainable fluid.  Patient is afebrile while in the ED.  He has not had any antibiotics since last evening and did not understand that there was medication to pick up at his pharmacy.  A prescription for Bactrim DS twice daily for 10 days and tramadol is waiting for him.  This was verified.  Patient is also encouraged to do sits baths 2-3 times a day if he is able to get in the bathtub safely or use warm moist compresses to the area frequently.  Patient is encouraged to call the surgeon listed on his discharge papers for an appointment on Monday for follow-up.  He also will take Tylenol or ibuprofen as needed for fever.  A tramadol was given to him prior to discharge and patient is going to pick up his medication today. ____________________________________________   FINAL CLINICAL IMPRESSION(S) / ED DIAGNOSES  Final diagnoses:  Pilonidal abscess     ED Discharge  Orders    None       Note:  This document was prepared using Dragon voice recognition software and may include unintentional dictation errors.    Johnn Hai, PA-C 11/22/19 1336    Carrie Mew, MD 11/25/19 2051

## 2019-11-22 NOTE — ED Triage Notes (Signed)
Reports dx with pilonidal cyst and cellulitis on buttocks. Seen in ED yesterday and states that cyst was draining on it's own. Pt reports he had fever this AM, did not take any antipyretics prior to coming. Was not prescribed ABX last night.

## 2019-11-22 NOTE — Telephone Encounter (Signed)
Please check on patient.  I saw the ER note and CT results.  Please make sure he has f/u scheduled with surgery clinic.  Thanks.

## 2019-11-22 NOTE — Telephone Encounter (Signed)
Patient is headed back to the hospital because he has developed chills and fever.  Wife was supposed to call for appt with gen surgery this morning but because of the recent developments, he is headed back to the hospital and they will go from there.

## 2019-11-22 NOTE — ED Notes (Signed)
See triage note  States he was seen yesterday for possible pilonidal cyst    States area is more swollen

## 2019-11-22 NOTE — Telephone Encounter (Signed)
Noted. Thanks.  Will await ER vs inpatient notes.

## 2019-11-22 NOTE — ED Triage Notes (Signed)
First RN Note: Pt presents to ED via POV, pt seen yesterday and dx with pilonidal cyst and cellulitis on the buttocks, pt's wife reports awoke this morning with oral temp of 101.

## 2019-11-25 ENCOUNTER — Encounter: Payer: Self-pay | Admitting: Surgery

## 2019-11-25 ENCOUNTER — Other Ambulatory Visit: Payer: Self-pay

## 2019-11-25 ENCOUNTER — Ambulatory Visit (INDEPENDENT_AMBULATORY_CARE_PROVIDER_SITE_OTHER): Payer: Medicare Other | Admitting: Surgery

## 2019-11-25 VITALS — BP 125/56 | HR 72 | Temp 97.1°F | Resp 14 | Ht 69.0 in | Wt 193.8 lb

## 2019-11-25 DIAGNOSIS — L0231 Cutaneous abscess of buttock: Secondary | ICD-10-CM | POA: Diagnosis not present

## 2019-11-25 MED ORDER — METRONIDAZOLE 500 MG PO TABS
500.0000 mg | ORAL_TABLET | Freq: Three times a day (TID) | ORAL | 0 refills | Status: AC
Start: 2019-11-25 — End: 2019-12-09

## 2019-11-25 MED ORDER — CIPROFLOXACIN HCL 500 MG PO TABS
500.0000 mg | ORAL_TABLET | Freq: Two times a day (BID) | ORAL | 0 refills | Status: AC
Start: 2019-11-25 — End: 2019-12-09

## 2019-11-25 NOTE — Patient Instructions (Addendum)
As discussed with Dr Everlene Farrier, please stop the Brilinta. Do not take any more doses. Pick up your medications at your local pharmacy and start those today.   We will see you in 2 days. See your appointment below.  Call the office if you have any questions or concerns.   Pilonidal Cyst  A pilonidal cyst is a fluid-filled sac that forms beneath the skin near the tailbone, at the top of the crease of the buttocks (pilonidal area). If the cyst is not large and not infected, it may not cause any problems. If the cyst becomes irritated or infected, it may get larger and fill with pus. An infected cyst is called an abscess. A pilonidal abscess may cause pain and swelling, and it may need to be drained or removed. What are the causes? The cause of this condition is not always known. In some cases, a hair that grows into your skin (ingrown hair) may be the cause. What increases the risk? You are more likely to get a pilonidal cyst if you:  Are male.  Have lots of hair near the crease of the buttocks.  Are overweight.  Have a dimple near the crease of the buttocks.  Wear tight clothing.  Do not bathe or shower often.  Sit for long periods of time. What are the signs or symptoms? Signs and symptoms of a pilonidal cyst may include pain, swelling, redness, and warmth in the pilonidal area. Depending on how big the cyst is, you may be able to feel a lump near your tailbone. If your cyst becomes infected, symptoms may include:  Pus or fluid drainage.  Fever.  Pain, swelling, and redness getting worse.  The lump getting bigger. How is this diagnosed? This condition may be diagnosed based on:  Your symptoms and medical history.  A physical exam.  A blood test to check for infection.  Testing a pus sample, if applicable. How is this treated? If your cyst does not cause symptoms, you may not need any treatment. If your cyst bothers you or is infected, you may need a procedure to drain or  remove the cyst. Depending on the size, location, and severity of your cyst, your health care provider may:  Make an incision in the cyst and drain it (incision and drainage).  Open and drain the cyst, and then stitch the wound so that it stays open while it heals (marsupialization). You will be given instructions about how to care for your open wound while it heals.  Remove all or part of the cyst, and then close the wound (cyst removal). You may need to take antibiotic medicines before your procedure. Follow these instructions at home: Medicines  Take over-the-counter and prescription medicines only as told by your health care provider.  If you were prescribed an antibiotic medicine, take it as told by your health care provider. Do not stop taking the antibiotic even if you start to feel better. General instructions  Keep the area around your pilonidal cyst clean and dry.  If there is fluid or pus draining from your cyst: ? Cover the area with a clean bandage (dressing) as needed. ? Wash the area gently with soap and water. Pat the area dry with a clean towel. Do not rub the area because that may cause bleeding.  Remove hair from the area around the cyst only if your health care provider tells you to do this.  Do not wear tight pants or sit in one position for  long periods at a time.  Keep all follow-up visits as told by your health care provider. This is important. Contact a health care provider if you have:  New redness, swelling, or pain.  A fever.  Severe pain. Summary  A pilonidal cyst is a fluid-filled sac that forms beneath the skin near the tailbone, at the top of the crease of the buttocks (pilonidal area).  If the cyst becomes irritated or infected, it may get larger and fill with pus. An infected cyst is called an abscess.  The cause of this condition is not always known. In some cases, a hair that grows into your skin (ingrown hair) may be the cause.  If your  cyst does not cause symptoms, you may not need any treatment. If your cyst bothers you or is infected, you may need a procedure to drain or remove the cyst. This information is not intended to replace advice given to you by your health care provider. Make sure you discuss any questions you have with your health care provider. Document Revised: 06/15/2017 Document Reviewed: 06/15/2017 Elsevier Patient Education  [place].

## 2019-11-26 ENCOUNTER — Encounter: Payer: Self-pay | Admitting: Surgery

## 2019-11-26 NOTE — Progress Notes (Signed)
Patient ID: [name], male   DOB: 08/13/1945, 74 y.o.   MRN: [id number]  HPI [name] is a 74 y.o. male seen in consultation at the request of Dr. Damita Dunnings for an abscess in the left gluteal area versus pilonidal.  He has significant comorbidities including coronary artery disease status post multiple stents on dual antiplatelet therapy to include aspirin and Brilinta.  He does have a history of diabetes, COPD, chronic kidney disease, hypertension. Reports that he has pain on his buttocks area for the last 6 days or so.  Worsening when he sits.  The pain is moderate intensity and sharp in nature.  He apparently did have some fevers last week.  Has been started on Bactrim with some response.  He did have a CT scan in the emergency room that I have personally reviewed showing evidence of a left gluteus phlegmon without evidence of necrotizing infection and well-defined abscess.  His labs including an increase in the white count from 15,000 and creatinine 1.4. He does have significant dyspnea on exertion due to his COPD and uses inhalers on regular basis HPI  Past Medical History:  Diagnosis Date  . CAD (coronary artery disease)    a. 1999: PCI-->LAD 2/2 MI; b. 2005: inf MI s/p PCI/DES x 4 to RCA; c. Myoview in 12/2005: EF 61%, no evidence for ischemia; d. cath 10/2014: occluded mLAD w/ L to L and L to R collats, dRCA 95% s/p PCI/DES 0%, mPDA 70%, LCx mild to mod irregs, procedure complicated by inf ST ele in recovery, repeat cath showed acute dRCA stent thrombosis o/w occluded mRPDA, PTCA dRCA, PCI/DES RPDA, aggrastat x 18 hr  . CKD (chronic kidney disease)   . COPD (chronic obstructive pulmonary disease) ([place])   . DM2 (diabetes mellitus, type 2) ([place])   . H/O hiatal hernia   . Heart attack ([place]) [id number]   had 2 in one day(released plaque during angio). 5 stents total  . HTN (hypertension)   . Hypercholesterolemia   . Impaired fasting glucose    Elevated after steroid injection  .  Macular degeneration of right eye 2020   receiving injections into eye  . Osteoarthritis of hip    injections in both hips  . Osteoarthritis, knee   . PAD (peripheral artery disease) ([facility])    L fem to below the knee popliteal vein bypass graft  . Peri-rectal abscess 12/27/2011  . Tobacco abuse    Prior    Past Surgical History:  Procedure Laterality Date  . CORONARY ANGIOPLASTY    . [place], 2006, 2016 x 2   Multiple, LAD in 1999, RCA in 2005.  has a total of 4 stents  . ENDARTERECTOMY Left 09/21/2018   Procedure: ENDARTERECTOMY CAROTID;  Surgeon: Katha Cabal, MD;  Location: [facility] ORS;  Service: Vascular;  Laterality: Left;  . FEMORAL-POPLITEAL BYPASS GRAFT  2016   L fem to below the knee popliteal vein bypass graft  . INCISE AND DRAIN ABCESS  2007   on scrotum  . INCISION AND DRAINAGE PERIRECTAL ABSCESS  12/28/2011   Procedure: IRRIGATION AND DEBRIDEMENT PERIRECTAL ABSCESS;  Surgeon: Merrie Roof, MD;  Location: [facility] OR;  Service: General;  Laterality: N/A;    Family History  Problem Relation Age of Onset  . Heart failure Father        CHF  . CAD Mother   . Diabetes Mother   . Colon cancer Neg Hx   . Prostate  cancer Neg Hx     Social History Social History   Tobacco Use  . Smoking status: Former Smoker    Packs/day: 1.00    Years: 40.00    Pack years: 40.00    Types: Cigarettes    Quit date: 10/21/1998    Years since quitting: 21.1  . Smokeless tobacco: Never Used  . Tobacco comment: Past smoker, states he will smoke maybe 1 cigarette/ month or so still  Substance Use Topics  . Alcohol use: Not Currently    Alcohol/week: 0.0 standard drinks    Comment: Occasional beer on the weekends  . Drug use: No    Allergies  Allergen Reactions  . Augmentin [Amoxicillin-Pot Clavulanate] Other (See Comments)    Nausea,vomiting,diarrhea  . Morphine And Related Nausea And Vomiting    Vomiting, GI upset    Current Outpatient Medications   Medication Sig Dispense Refill  . acetaminophen (TYLENOL) 325 MG tablet Take 1-2 tablets (325-650 mg total) by mouth every 4 (four) hours as needed for mild pain (or temp >/= 101 F). 100 tablet 0  . albuterol (PROAIR HFA) 108 (90 BASE) MCG/ACT inhaler Inhale 2 puffs into the lungs 3 (three) times daily as needed (cough). 18 g 2  . amLODipine (NORVASC) 2.5 MG tablet Take 1 tablet (2.5 mg total) by mouth daily. 30 tablet 6  . aspirin 81 MG EC tablet Take 1 tablet (81 mg total) by mouth daily. 30 tablet 0  . atorvastatin (LIPITOR) 40 MG tablet TAKE 1 TABLET BY MOUTH EVERY DAY 90 tablet 3  . blood glucose meter kit and supplies KIT 1 each by Does not apply route daily as needed. Dispense based on patient and insurance preference. Use up to four times daily as directed. (FOR ICD-9 250.00, 250.01). 1 each 0  . BRILINTA 60 MG TABS tablet TAKE 1 TABLET BY MOUTH TWICE DAILY 180 tablet 3  . fenofibrate (TRICOR) 145 MG tablet TAKE 1 TABLET(145 MG) BY MOUTH DAILY 30 tablet 11  . Fluticasone-Salmeterol (ADVAIR DISKUS) 250-50 MCG/DOSE AEPB Inhale 1 puff into the lungs 2 (two) times daily. 3 each 3  . glipiZIDE (GLUCOTROL) 5 MG tablet Take 1 tablet (5 mg total) by mouth 2 (two) times daily before a meal. 180 tablet 1  . glucosamine-chondroitin 500-400 MG tablet Take 1 tablet by mouth 2 (two) times daily.    [place] Kitchen glucose blood (ONETOUCH ULTRA) test strip Check sugar 2 times daily DX E11.9 with h/o blood sugar variation 100 each 5  . insulin glargine (LANTUS SOLOSTAR) 100 UNIT/ML Solostar Pen ADMINISTER UP TO 45 UNITS UNDER THE SKIN DAILY    . Insulin Pen Needle (B-D UF III MINI PEN NEEDLES) 31G X 5 MM MISC Use as instructed to inject insulin.  Diagnosis:  250.00  Insulin dependent. 100 each 3  . lisinopril (ZESTRIL) 40 MG tablet TAKE 1 TABLET BY MOUTH EVERY DAY 90 tablet 0  . loperamide (IMODIUM A-D) 2 MG tablet Take 1 tablet (2 mg total) by mouth 3 (three) times daily as needed for diarrhea or loose stools.    .  meclizine (ANTIVERT) 25 MG tablet Take 25 mg by mouth every 6 (six) hours as needed. For dizziness/vertigo    . metFORMIN (GLUCOPHAGE) 500 MG tablet Take 1 tablet (500 mg total) by mouth 2 (two) times daily with a meal.    . metoprolol tartrate (LOPRESSOR) 25 MG tablet TAKE 1/2 TABLET BY MOUTH TWICE DAILY 90 tablet 3  . Multiple Vitamins-Minerals (PRESERVISION AREDS 2 PO)  Take 1 tablet by mouth 2 (two) times daily.    . nitroGLYCERIN (NITROSTAT) 0.4 MG SL tablet Place 1 tablet (0.4 mg total) under the tongue every 5 (five) minutes as needed. For chest pain 25 tablet 12  . sulfamethoxazole-trimethoprim (BACTRIM DS) 800-160 MG tablet Take 1 tablet by mouth 2 (two) times daily. 20 tablet 0  . traMADol (ULTRAM) 50 MG tablet Take 1 tablet (50 mg total) by mouth 3 (three) times daily as needed. 15 tablet 0  . ciprofloxacin (CIPRO) 500 MG tablet Take 1 tablet (500 mg total) by mouth 2 (two) times daily for 14 days. 28 tablet 0  . metroNIDAZOLE (FLAGYL) 500 MG tablet Take 1 tablet (500 mg total) by mouth 3 (three) times daily for 14 days. 42 tablet 0   No current facility-administered medications for this visit.     Review of Systems Full ROS  was asked and was negative except for the information on the HPI  Physical Exam Blood pressure (!) 125/56, pulse 72, temperature (!) 97.1 F (36.2 C), resp. rate 14, height 5' 9"  (1.753 m), weight 193 lb 12.8 oz (87.9 kg), SpO2 97 %. CONSTITUTIONAL: NAD alert. EYES: Pupils are equal, round, and reactive to light, Sclera are non-icteric. EARS, NOSE, MOUTH AND THROAT: Wearing a mask. Hearing is intact to voice. LYMPH NODES:  Lymph nodes in the neck are normal. RESPIRATORY:  Lungs are clear. There is normal respiratory effort, with equal breath sounds bilaterally, and without pathologic use of accessory muscles. CARDIOVASCULAR: Heart is regular without murmurs, gallops, or rubs. GI: The abdomen is soft, nontender, and nondistended. There are no palpable masses.  There is no hepatosplenomegaly. There are normal bowel sounds in all quadrants. Rectal: There is evidence of a left phlegmonous process on the gluteal area, no necrotizing infection, no definitve abscess, tenderness to palpation under significant induration. MUSCULOSKELETAL: Normal muscle strength and tone. No cyanosis or edema.   SKIN: Turgor is good and there are no pathologic skin lesions or ulcers. NEUROLOGIC: Motor and sensation is grossly normal. Cranial nerves are grossly intact. PSYCH:  Oriented to person, place and time. Affect is normal.  Data Reviewed  I have personally reviewed the patient's imaging, laboratory findings and medical records.    Assessment/Plan 74 year old male with left-sided gluteal phlegmon.  I will change the antibiotic to ciprofloxacin and Flagyl and I will see him in 48 hours.  He is taking currently Brilinta and aspirin and there is significant risk of bleeding.  If in 48 hours his condition does not improve will likely require exam under anesthesia and debridement.  Currently the patient is not toxic and does not need any emergent surgical intervention at this time.  Caroleen Hamman, MD FACS General Surgeon 11/26/2019, 11:00 AM

## 2019-11-27 ENCOUNTER — Other Ambulatory Visit: Payer: Self-pay

## 2019-11-27 ENCOUNTER — Encounter: Payer: Self-pay | Admitting: Surgery

## 2019-11-27 ENCOUNTER — Ambulatory Visit (INDEPENDENT_AMBULATORY_CARE_PROVIDER_SITE_OTHER): Payer: Medicare Other | Admitting: Surgery

## 2019-11-27 VITALS — BP 121/71 | HR 65 | Temp 97.2°F | Ht 70.0 in | Wt 194.6 lb

## 2019-11-27 DIAGNOSIS — L0591 Pilonidal cyst without abscess: Secondary | ICD-10-CM

## 2019-11-27 NOTE — Patient Instructions (Addendum)
Dr.Pabon advised patient to apply about a foot long piece of packing to the area and apply gauze and tape to the area.  Patient will follow up in two weeks. Continue with Antibiotic treatment.   Pilonidal Cyst Drainage, Care After This sheet gives you information about how to care for yourself after your procedure. Your health care provider may also give you more specific instructions. If you have problems or questions, contact your health care provider. What can I expect after the procedure? After the procedure, it is common to have:  Pain that gets better when you take medicine.  Some fluid or blood coming from your wound. Follow these instructions at home: Medicines  Take over-the-counter and prescription medicines only as told by your health care provider.  If you were prescribed an antibiotic medicine, take it as told by your health care provider. Do not stop taking the antibiotic even if you start to feel better. Lifestyle  Do not do activities that irritate or put pressure on your buttocks for about 2 weeks, or as long as told by your health care provider. These activities include bike riding, running, and anything that involves a twisting motion.  Do not sit for long periods at a time without getting up to move around.  Sleep on your side instead of your back.  Avoid wearing tight underwear and tight pants. Bathing  Do not take baths or showers, swim, or use a hot tub until your health care provider approves. This depends on the type of wound you have from surgery.  While bathing, clean your buttocks area gently with soap and water.  After bathing: ? Pat the area dry with a soft, clean towel. ? Cover the area with a clean bandage (dressing), if told to by your health care provider. General instructions   If you are taking prescription pain medicine, take actions to prevent or treat constipation. Your health care provider may recommend that you: ? Drink enough fluid to  keep your urine pale yellow. ? Eat foods that are high in fiber, such as fresh fruits and vegetables, whole grains, and beans. ? Limit foods that are high in fat and processed sugars, such as fried or sweet foods. ? Take an over-the-counter or prescription medicine for constipation.  You will need to have a caregiver help you manage wound care and dressing changes. Your caregiver should: ? Wash his or her hands with soap and water before changing your dressing. If soap and water are not available, your caregiver should use hand sanitizer. ? Check your wound every day for signs of infection, such as:  Redness, swelling, or more pain.  More fluid or blood.  Warmth.  Pus or a bad smell. ? Follow any additional instructions from your health care provider on how to care for your wound, such as wound cleaning, wound flushing (irrigation), or packing your wound with a dressing.  Keep all follow-up visits as told by your health care provider. This is important. If you had incision and drainage with wound packing:  Return to your health care provider as instructed to have your packing material changed or removed.  Keep the area dry until your packing has been removed.  After the packing has been removed, you may start taking showers. If you had marsupialization:  You may start taking showers the day after surgery, or when your health care provider approves.  Remove your dressing before you shower, but let the water from the shower moisten your dressing before  you remove it. This will make it easier to remove.  Ask your health care provider when you can stop using a dressing. If you had incision and drainage without wound packing:  Change your dressing as directed.  Leave stitches (sutures), skin glue, or adhesive strips in place. These skin closures may need to stay in place for 2 weeks or longer. If adhesive strip edges start to loosen and curl up, you may trim the loose edges. Do not  remove adhesive strips completely unless your health care provider tells you to do that. Contact a health care provider if:  You have redness, swelling, or more pain around your wound.  You have more fluid or blood coming from your wound.  You have new bleeding from your wound.  Your wound feels warm to the touch.  There is pus or a bad smell coming from your wound.  You have pain that does not get better with medicine.  You have a fever or chills.  You have muscle aches.  You are dizzy.  You feel generally sick. Summary  After a procedure to drain a pilonidal cyst, it is common to have some fluid or blood coming from your wound.  If you were prescribed an antibiotic medicine, take it as told by your health care provider. Do not stop taking the antibiotic even if you start to feel better.  Return to your health care provider as instructed to have any packing material changed or removed. This information is not intended to replace advice given to you by your health care provider. Make sure you discuss any questions you have with your health care provider. Document Revised: 04/19/2018 Document Reviewed: 06/19/2017 Elsevier Patient Education  2020 [employer].

## 2019-11-28 ENCOUNTER — Encounter: Payer: Self-pay | Admitting: Surgery

## 2019-11-28 NOTE — Progress Notes (Signed)
Outpatient Surgical Follow Up  11/28/2019  [name] is an 74 y.o. male.   Chief Complaint  Patient presents with  . Follow-up    Gluteal abscess    HPI: 2-year-old male with perirectal phlegmon today for close follow-up.  He has switched to ciprofloxacin and Flagyl with some good response and now experiences pain issues.  No fevers no chills.  He continues to have persistent pain.  He has held his Brilinta.  Past Medical History:  Diagnosis Date  . CAD (coronary artery disease)    a. 1999: PCI-->LAD 2/2 MI; b. 2005: inf MI s/p PCI/DES x 4 to RCA; c. Myoview in 12/2005: EF 61%, no evidence for ischemia; d. cath 10/2014: occluded mLAD w/ L to L and L to R collats, dRCA 95% s/p PCI/DES 0%, mPDA 70%, LCx mild to mod irregs, procedure complicated by inf ST ele in recovery, repeat cath showed acute dRCA stent thrombosis o/w occluded mRPDA, PTCA dRCA, PCI/DES RPDA, aggrastat x 18 hr  . CKD (chronic kidney disease)   . COPD (chronic obstructive pulmonary disease) ([facility])   . DM2 (diabetes mellitus, type 2) ([facility])   . H/O hiatal hernia   . Heart attack ([facility]) [id number]   had 2 in one day(released plaque during angio). 5 stents total  . HTN (hypertension)   . Hypercholesterolemia   . Impaired fasting glucose    Elevated after steroid injection  . Macular degeneration of right eye 2020   receiving injections into eye  . Osteoarthritis of hip    injections in both hips  . Osteoarthritis, knee   . PAD (peripheral artery disease) ([facility])    L fem to below the knee popliteal vein bypass graft  . Peri-rectal abscess 12/27/2011  . Tobacco abuse    Prior    Past Surgical History:  Procedure Laterality Date  . CORONARY ANGIOPLASTY    . CORONARY STENT PLACEMENT  1999, 2006, 2016 x 2   Multiple, LAD in 1999, RCA in 2005.  has a total of 4 stents  . ENDARTERECTOMY Left 09/21/2018   Procedure: ENDARTERECTOMY CAROTID;  Surgeon: Renford Dills, MD;  Location: [facility] ORS;  Service: Vascular;   Laterality: Left;  . FEMORAL-POPLITEAL BYPASS GRAFT  2016   L fem to below the knee popliteal vein bypass graft  . INCISE AND DRAIN ABCESS  2007   on scrotum  . INCISION AND DRAINAGE PERIRECTAL ABSCESS  12/28/2011   Procedure: IRRIGATION AND DEBRIDEMENT PERIRECTAL ABSCESS;  Surgeon: Robyne Askew, MD;  Location: [facility] OR;  Service: General;  Laterality: N/A;    Family History  Problem Relation Age of Onset  . Heart failure Father        CHF  . CAD Mother   . Diabetes Mother   . Colon cancer Neg Hx   . Prostate cancer Neg Hx     Social History:  reports that he quit smoking about 21 years ago. His smoking use included cigarettes. He has a 40.00 pack-year smoking history. He has never used smokeless tobacco. He reports previous alcohol use. He reports that he does not use drugs.  Allergies:  Allergies  Allergen Reactions  . Augmentin [Amoxicillin-Pot Clavulanate] Other (See Comments)    Nausea,vomiting,diarrhea  . Morphine And Related Nausea And Vomiting    Vomiting, GI upset    Medications reviewed.    ROS Full ROS performed and is otherwise negative other than what is stated in HPI   BP 121/71   Pulse 65  Temp (!) 97.2 F (36.2 C)   Ht 5\' 10"  (1.778 m)   Wt 194 lb 9.6 oz (88.3 kg)   SpO2 97%   BMI 27.92 kg/m   Physical Exam Vitals and nursing note reviewed. Exam conducted with a chaperone present.  Abdominal:     General: Abdomen is flat. There is no distension.     Palpations: There is no mass.     Tenderness: There is no abdominal tenderness. There is no rebound.     Hernia: No hernia is present.  Genitourinary:    Comments: Evidence of prior left perirectal abscess.  There is fluctuance and tenderness to palpation. Skin:    General: Skin is warm and dry.     Capillary Refill: Capillary refill takes less than 2 seconds.  Neurological:     General: No focal deficit present.     Mental Status: He is alert and oriented to person, place, and time.   Psychiatric:        Mood and Affect: Mood normal.        Behavior: Behavior normal.        Thought Content: Thought content normal.        Judgment: Judgment normal.    Assessment/Plan: Left perirectal abscess in need for I&D.  I do think that we can potentially do it here at the bedside.  Procedure discussed with patient detail.  Risk benefit and possible indications including but not limited to: Bleeding, infection persistent pain and potential for formal exam under anesthesia.  Greater than 50% of the 25 minutes  visit was spent in counseling/coordination of care  Procedure Note 1. I/D perirectal abscess 2. Debridement 4 cms2 perirectal area including fascia  Anesthesia: lidocaine 1% w epi  Complications: none  EBL: 2cc  Patient was planned the risk-benefit procedure possible complications.  A consent was obtained.  He was placed in lateral decubitus position and prepped and draped in usual sterile fashion.  Using lidocaine 1% with epinephrine we injected on the infiltrated the area.  Using the 11 blade knife elliptical incision was created on excisional debridement of skin was performed.  I was able to drained and cultured an abscess.  Also I was able to perform debridement of the cavity using a 15 blade knife.  Irrigation was performed hemostasis was performed with pressure.  Half-inch packing was placed and a sterile dressing applied. No complications  Caroleen Hamman, MD [place] Surgeon

## 2019-12-04 LAB — ANAEROBIC AND AEROBIC CULTURE

## 2019-12-11 ENCOUNTER — Encounter: Payer: Self-pay | Admitting: Surgery

## 2019-12-11 ENCOUNTER — Ambulatory Visit (INDEPENDENT_AMBULATORY_CARE_PROVIDER_SITE_OTHER): Payer: Self-pay | Admitting: Surgery

## 2019-12-11 ENCOUNTER — Other Ambulatory Visit: Payer: Self-pay

## 2019-12-11 VITALS — BP 124/68 | HR 66 | Temp 97.9°F | Ht 69.0 in | Wt 195.8 lb

## 2019-12-11 DIAGNOSIS — Z09 Encounter for follow-up examination after completed treatment for conditions other than malignant neoplasm: Secondary | ICD-10-CM

## 2019-12-11 NOTE — Patient Instructions (Signed)
   Follow-up with our office as needed.  Please call and ask to speak with a nurse if you develop questions or concerns.  

## 2019-12-12 NOTE — Progress Notes (Signed)
S/p I/d perirectal abscess Finishes a/bs No issues Feeling much better  PE NAd  Wound healing well, no infection, large bandaid placed, no need to pack  A/p Doing well RTC prn

## 2020-01-07 DIAGNOSIS — H353123 Nonexudative age-related macular degeneration, left eye, advanced atrophic without subfoveal involvement: Secondary | ICD-10-CM | POA: Diagnosis not present

## 2020-01-07 DIAGNOSIS — H43391 Other vitreous opacities, right eye: Secondary | ICD-10-CM | POA: Diagnosis not present

## 2020-01-07 DIAGNOSIS — H3561 Retinal hemorrhage, right eye: Secondary | ICD-10-CM | POA: Diagnosis not present

## 2020-01-07 DIAGNOSIS — H353211 Exudative age-related macular degeneration, right eye, with active choroidal neovascularization: Secondary | ICD-10-CM | POA: Diagnosis not present

## 2020-01-08 ENCOUNTER — Other Ambulatory Visit: Payer: Self-pay | Admitting: Cardiovascular Disease

## 2020-01-17 ENCOUNTER — Other Ambulatory Visit: Payer: Self-pay

## 2020-01-17 ENCOUNTER — Other Ambulatory Visit (INDEPENDENT_AMBULATORY_CARE_PROVIDER_SITE_OTHER): Payer: Medicare Other

## 2020-01-17 DIAGNOSIS — E119 Type 2 diabetes mellitus without complications: Secondary | ICD-10-CM | POA: Diagnosis not present

## 2020-01-17 LAB — CBC WITH DIFFERENTIAL/PLATELET
Basophils Absolute: 0 10*3/uL (ref 0.0–0.1)
Basophils Relative: 0.4 % (ref 0.0–3.0)
Eosinophils Absolute: 0.3 10*3/uL (ref 0.0–0.7)
Eosinophils Relative: 3.8 % (ref 0.0–5.0)
HCT: 43.1 % (ref 39.0–52.0)
Hemoglobin: 14.2 g/dL (ref 13.0–17.0)
Lymphocytes Relative: 25.2 % (ref 12.0–46.0)
Lymphs Abs: 2 10*3/uL (ref 0.7–4.0)
MCHC: 32.9 g/dL (ref 30.0–36.0)
MCV: 92.8 fl (ref 78.0–100.0)
Monocytes Absolute: 0.8 10*3/uL (ref 0.1–1.0)
Monocytes Relative: 10 % (ref 3.0–12.0)
Neutro Abs: 4.9 10*3/uL (ref 1.4–7.7)
Neutrophils Relative %: 60.6 % (ref 43.0–77.0)
Platelets: 188 10*3/uL (ref 150.0–400.0)
RBC: 4.64 Mil/uL (ref 4.22–5.81)
RDW: 15.3 % (ref 11.5–15.5)
WBC: 8 10*3/uL (ref 4.0–10.5)

## 2020-01-17 LAB — COMPREHENSIVE METABOLIC PANEL
ALT: 11 U/L (ref 0–53)
AST: 10 U/L (ref 0–37)
Albumin: 4 g/dL (ref 3.5–5.2)
Alkaline Phosphatase: 34 U/L — ABNORMAL LOW (ref 39–117)
BUN: 16 mg/dL (ref 6–23)
CO2: 27 mEq/L (ref 19–32)
Calcium: 9.4 mg/dL (ref 8.4–10.5)
Chloride: 105 mEq/L (ref 96–112)
Creatinine, Ser: 1.24 mg/dL (ref 0.40–1.50)
GFR: 56.87 mL/min — ABNORMAL LOW (ref >60.00)
Glucose, Bld: 145 mg/dL — ABNORMAL HIGH (ref 70–99)
Potassium: 4.1 mEq/L (ref 3.5–5.1)
Sodium: 141 mEq/L (ref 135–145)
Total Bilirubin: 0.6 mg/dL (ref 0.2–1.2)
Total Protein: 6.1 g/dL (ref 6.0–8.3)

## 2020-01-17 LAB — LIPID PANEL
Cholesterol: 110 mg/dL (ref 0–200)
HDL: 24.3 mg/dL — ABNORMAL LOW (ref >39.00)
NonHDL: 86.03
Total CHOL/HDL Ratio: 5
Triglycerides: 229 mg/dL — ABNORMAL HIGH (ref 0.0–149.0)
VLDL: 45.8 mg/dL — ABNORMAL HIGH (ref 0.0–40.0)

## 2020-01-17 LAB — HEMOGLOBIN A1C: Hgb A1c MFr Bld: 8.2 % — ABNORMAL HIGH (ref 4.6–6.5)

## 2020-01-17 LAB — LDL CHOLESTEROL, DIRECT: Direct LDL: 56 mg/dL

## 2020-01-24 ENCOUNTER — Ambulatory Visit: Payer: Medicare Other | Admitting: Family Medicine

## 2020-01-24 ENCOUNTER — Encounter: Payer: Self-pay | Admitting: Family Medicine

## 2020-01-24 DIAGNOSIS — E1151 Type 2 diabetes mellitus with diabetic peripheral angiopathy without gangrene: Secondary | ICD-10-CM | POA: Diagnosis not present

## 2020-01-24 DIAGNOSIS — E782 Mixed hyperlipidemia: Secondary | ICD-10-CM

## 2020-01-24 MED ORDER — BD PEN NEEDLE MINI U/F 31G X 5 MM MISC: 3 refills | Status: DC

## 2020-01-24 NOTE — Patient Instructions (Signed)
Recheck a1c at a visit in about 3-4 months.  You don't have to fast.  Thanks for your efforts.   Take care.  Glad to see you.

## 2020-01-24 NOTE — Progress Notes (Signed)
This visit occurred during the SARS-CoV-2 public health emergency.  Safety protocols were in place, including screening questions prior to the visit, additional usage of staff PPE, and extensive cleaning of exam room while observing appropriate contact time as indicated for disinfecting solutions.  Elevated Cholesterol: Using medications without problems: yes Muscle aches: no Diet compliance: d/w pt.   Exercise: d/w pt.   Still on atorvastatin and fenofibrate.   He had eye clinic f/u with injection.  He has continued changes with the R eye.    Diabetes: Using medications without difficulties: yes Hypoglycemic episodes:no Hyperglycemic episodes:no Feet problems: no Blood Sugars averaging: usually ~130-160s eye exam within last year: yes, see above Still on insulin, metformin, glipizide.    He had pilonidal cyst treatment.  He is better in the meantime, healed.    Meds, vitals, and allergies reviewed.   ROS: Per HPI unless specifically indicated in ROS section   GEN: nad, alert and oriented HEENT: ncat NECK: supple w/o LA CV: rrr. PULM: ctab, no inc wob ABD: soft, +bs EXT: no edema SKIN: no acute rash  Diabetic foot exam: Normal inspection No skin breakdown No calluses  Normal DP pulses Normal sensation to light touch and monofilament Nails normal

## 2020-01-26 NOTE — Assessment & Plan Note (Signed)
A1c is better but not all the way to goal. No change in medication at this point. Continue insulin Metformin and glipizide. His A1c may continue to improve since his sugar has been 130-160 recently in the mornings. Recheck in a few months. See after visit summary.

## 2020-01-26 NOTE — Assessment & Plan Note (Signed)
LDL at goal. Triglycerides still elevated. That may improve with continued diabetic improvement. Continue atorvastatin and fenofibrate. He will update me as needed.

## 2020-02-04 DIAGNOSIS — H43391 Other vitreous opacities, right eye: Secondary | ICD-10-CM | POA: Diagnosis not present

## 2020-02-04 DIAGNOSIS — H43813 Vitreous degeneration, bilateral: Secondary | ICD-10-CM | POA: Diagnosis not present

## 2020-02-04 DIAGNOSIS — H353211 Exudative age-related macular degeneration, right eye, with active choroidal neovascularization: Secondary | ICD-10-CM | POA: Diagnosis not present

## 2020-02-04 DIAGNOSIS — H353123 Nonexudative age-related macular degeneration, left eye, advanced atrophic without subfoveal involvement: Secondary | ICD-10-CM | POA: Diagnosis not present

## 2020-02-24 ENCOUNTER — Other Ambulatory Visit: Payer: Self-pay | Admitting: Family Medicine

## 2020-03-04 DIAGNOSIS — H43813 Vitreous degeneration, bilateral: Secondary | ICD-10-CM | POA: Diagnosis not present

## 2020-03-04 DIAGNOSIS — H353123 Nonexudative age-related macular degeneration, left eye, advanced atrophic without subfoveal involvement: Secondary | ICD-10-CM | POA: Diagnosis not present

## 2020-03-04 DIAGNOSIS — E119 Type 2 diabetes mellitus without complications: Secondary | ICD-10-CM | POA: Diagnosis not present

## 2020-03-04 DIAGNOSIS — H353211 Exudative age-related macular degeneration, right eye, with active choroidal neovascularization: Secondary | ICD-10-CM | POA: Diagnosis not present

## 2020-04-06 ENCOUNTER — Other Ambulatory Visit: Payer: Self-pay | Admitting: Cardiovascular Disease

## 2020-04-07 NOTE — Telephone Encounter (Signed)
Please schedule office visit with Dr. Gollan. Thank you! 

## 2020-04-07 NOTE — Telephone Encounter (Signed)
Scheduled for 10.8

## 2020-04-13 ENCOUNTER — Telehealth: Payer: Self-pay

## 2020-04-13 NOTE — Telephone Encounter (Signed)
Pt wanted to know if [facility] was giving covid vaccines now for the 3rd covid shot and I advised no but gave pt [phone] to call for covid vaccine appt. Pt voiced understanding and appreciative.

## 2020-04-14 NOTE — Progress Notes (Signed)
Cardiology Office Note    Date:  04/17/2020   ID:  [name], DOB [birth year], MRN [id number]  PCP:  Joaquim Nam, MD  Cardiologist:  Julien Nordmann, MD  Electrophysiologist:  None   Chief Complaint: Follow up  History of Present Illness:   [name] is a 74 y.o. male with history of CAD status post prior stenting to the LAD and RCA as detailed below, PAD status post left fem-pop bypass in 2016, left-sided carotid artery disease s/p left-sided CEA in 09/2018, abdominal aortic dilatation, popliteal artery aneurysm, DM2, HTN, HLD, COPD secondary to prior tobacco use for 50 years, and recurrent perirectal abscess who presents for  follow-up of his CAD.  Prior MI in 1999 with PCI to the LAD.  He suffered an inferior MI in 2005 with reported PCI/DES x4 to the RCA.  Most recent cath from 2016, in the setting of a non-STEMI, showed an occluded mid LAD with left-to-left and left-to-right collaterals as well as distal RCA 95% stenosis status post PCI/DES.  There was residual rPDA 70% stenosis and mild to moderate irregularities in the LCx.  The procedure was complicated by inferior ST elevation MI in recovery with repeat cath showing acute distal RCA stent thrombosis without occluded rPDA.  He underwent PTCA of the distal RCA and PCI/DES to the rPDA.  It has been noted the patient's anginal equivalent is scapular pain between his shoulder blades.  Most recent ischemic evaluation via nuclear stress test 08/2016 showed a small defect of mild severity present in the apical anterior and apex location. This was consistent with mild distal LAD ischemia. EF 55-65%. Low risk study.   He was last seen virtually in 04/2019 and doing well from a cardiac perspective.  With regards to his vascular disease is most recent carotid artery ultrasound from 07/2019 showed a 1 to 39% RICA stenosis with a patent left-sided CEA with mildly elevated left proximal to mid IAC velocity.  Most recent lower extremity  ABIs from 07/2019 normal and largely unchanged.  Abdominal ultrasound for evaluation of his AAA showed no evidence of abdominal aortic aneurysm with the largest aortic diameter essentially unchanged when compared to prior study.  He comes in doing well from a cardiac perspective.  He denies any chest pain, palpitations, dyspnea, dizziness, presyncope, syncope, falls, hematochezia, or melena.  He is tolerating all medications without issues.  He has been under increased stress over the past several months as he has been out of work and his older brother recently passed.  Blood pressure typically runs in the 140s over 70s at home.  He does not have any issues or concerns at this time.   Labs independently reviewed: 01/2020 - direct LDL 56, TC 110, TG 229, HDL 24, potassium 4.1, BUN 16, serum creatinine 1.24, albumin 4.0, AST/ALT normal, Hgb 14.2, PLT 188, A1c 8.2  Past Medical History:  Diagnosis Date   CAD (coronary artery disease)    a. 1999: PCI-->LAD 2/2 MI; b. 2005: inf MI s/p PCI/DES x 4 to RCA; c. Myoview in 12/2005: EF 61%, no evidence for ischemia; d. cath 10/2014: occluded mLAD w/ L to L and L to R collats, dRCA 95% s/p PCI/DES 0%, mPDA 70%, LCx mild to mod irregs, procedure complicated by inf ST ele in recovery, repeat cath showed acute dRCA stent thrombosis o/w occluded mRPDA, PTCA dRCA, PCI/DES RPDA, aggrastat x 18 hr   CKD (chronic kidney disease)    COPD (chronic obstructive pulmonary disease) (HCC)  DM2 (diabetes mellitus, type 2) (HCC)    H/O hiatal hernia    Heart attack (HCC) [phone]   had 2 in one day(released plaque during angio). 5 stents total   HTN (hypertension)    Hypercholesterolemia    Impaired fasting glucose    Elevated after steroid injection   Macular degeneration of right eye 2020   receiving injections into eye   Osteoarthritis of hip    injections in both hips   Osteoarthritis, knee    PAD (peripheral artery disease) (HCC)    L fem to  below the knee popliteal vein bypass graft   Peri-rectal abscess 12/27/2011   Tobacco abuse    Prior    Past Surgical History:  Procedure Laterality Date   CORONARY ANGIOPLASTY     CORONARY STENT PLACEMENT  1999, 2006, 2016 x 2   Multiple, LAD in 1999, RCA in 2005.  has a total of 4 stents   ENDARTERECTOMY Left 09/21/2018   Procedure: ENDARTERECTOMY CAROTID;  Surgeon: Renford Dills, MD;  Location: [facility] ORS;  Service: Vascular;  Laterality: Left;   FEMORAL-POPLITEAL BYPASS GRAFT  2016   L fem to below the knee popliteal vein bypass graft   INCISE AND DRAIN ABCESS  2007   on scrotum   INCISION AND DRAINAGE PERIRECTAL ABSCESS  12/28/2011   Procedure: IRRIGATION AND DEBRIDEMENT PERIRECTAL ABSCESS;  Surgeon: Robyne Askew, MD;  Location: [facility] OR;  Service: General;  Laterality: N/A;    Current Medications: No outpatient medications have been marked as taking for the 04/17/20 encounter (Office Visit) with Sondra Barges, PA-C.    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    Comment: still works full time  Tobacco Use   Smoking status: Former Smoker    Packs/day: 1.00    Years: 40.00    Pack years: 40.00    Types: Cigarettes    Quit date: 10/21/1998    Years since quitting: 21.5   Smokeless tobacco: Never Used   Tobacco comment: Past smoker, states he will smoke maybe 1 cigarette/ month or so still  Vaping Use   Vaping Use: Never used  Substance and Sexual Activity   Alcohol use: Not Currently    Alcohol/week: 0.0 standard drinks    Comment: Occasional beer on the weekends   Drug use: No   Sexual activity: Never  Other Topics Concern   Not on file  Social  History Narrative   Lives in [place]   Married 50+ years   2 grown daughters, 7 grandchildren, 4 great grandchildren   Designated Party Release Form signed on 01/19/10 appointing Evelene Croon.    Social Determinants of Health   Financial Resource Strain:    Difficulty of Paying Living Expenses: Not on file  Food Insecurity:    Worried About Programme researcher, broadcasting/film/video in the Last Year: Not on file   [employer] of Food in the Last Year: Not on file  Transportation Needs:    Lack of Transportation (Medical): Not on file   Lack of Transportation (Non-Medical): Not on file  Physical Activity:    Days of Exercise per Week: Not on file  Minutes of Exercise per Session: Not on file  Stress:    Feeling of Stress : Not on file  Social Connections:    Frequency of Communication with Friends and Family: Not on file   Frequency of Social Gatherings with Friends and Family: Not on file   Attends Religious Services: Not on file   Active Member of Clubs or Organizations: Not on file   Attends Banker Meetings: Not on file   Marital Status: Not on file     Family History:  The patient's family history includes CAD in his mother; Diabetes in his mother; Heart failure in his father. There is no history of Colon cancer or Prostate cancer.  ROS:   Review of Systems  Constitutional: Negative for chills, diaphoresis, fever, malaise/fatigue and weight loss.  HENT: Negative for congestion.   Eyes: Negative for discharge and redness.  Respiratory: Negative for cough, sputum production, shortness of breath and wheezing.   Cardiovascular: Negative for chest pain, palpitations, orthopnea, claudication, leg swelling and PND.  Gastrointestinal: Negative for abdominal pain, blood in stool, heartburn, melena, nausea and vomiting.  Musculoskeletal: Negative for falls and myalgias.  Skin: Negative for rash.  Neurological: Negative for dizziness, tingling, tremors, sensory change, speech  change, focal weakness, loss of consciousness and weakness.  Endo/Heme/Allergies: Does not bruise/bleed easily.  Psychiatric/Behavioral: Negative for substance abuse. The patient is not nervous/anxious.   All other systems reviewed and are negative.    EKGs/Labs/Other Studies Reviewed:    Studies reviewed were summarized above. The additional studies were reviewed today: As above.   EKG:  EKG is ordered today.  The EKG ordered today demonstrates NSR, 67 bpm, prior inferior infarct no acute ST-T changes, when compared to prior tracing no significant changes  Recent Labs: 01/17/2020: ALT 11; BUN 16; Creatinine, Ser 1.24; Hemoglobin 14.2; Platelets 188.0; Potassium 4.1; Sodium 141  Recent Lipid Panel    Component Value Date/Time   CHOL 110 01/17/2020 0817   CHOL 135 10/25/2014 0302   TRIG 229.0 (H) 01/17/2020 0817   TRIG 434 (H) 10/25/2014 0302   HDL 24.30 (L) 01/17/2020 0817   HDL 21 (L) 10/25/2014 0302   CHOLHDL 5 01/17/2020 0817   VLDL 45.8 (H) 01/17/2020 0817   VLDL SEE COMMENT 10/25/2014 0302   LDLCALC UNABLE TO CALCULATE IF TRIGLYCERIDE OVER 400 mg/dL 16/04/9603 5409   LDLCALC SEE COMMENT 10/25/2014 0302   LDLDIRECT 56.0 01/17/2020 0817    PHYSICAL EXAM:    VS:  BP (!) 174/86 (BP Location: Left Arm, Patient Position: Sitting, Cuff Size: Normal)    Pulse 67    Ht 5\' 8"  (1.727 m)    Wt 204 lb (92.5 kg)    SpO2 97%    BMI 31.02 kg/m   BMI: Body mass index is 31.02 kg/m.  Physical Exam Vitals reviewed.  Constitutional:      Appearance: He is well-developed.     Comments: Repeat BP 160/86  HENT:     Head: Normocephalic and atraumatic.  Eyes:     General:        Right eye: No discharge.        Left eye: No discharge.  Neck:     Vascular: No JVD.  Cardiovascular:     Rate and Rhythm: Normal rate and regular rhythm.     Pulses: No midsystolic click and no opening snap.          Carotid pulses are on the right side with bruit.  Posterior tibial pulses are 2+ on the  right side and 2+ on the left side.     Heart sounds: Normal heart sounds, S1 normal and S2 normal. Heart sounds not distant. No murmur heard.  No friction rub.  Pulmonary:     Effort: Pulmonary effort is normal. No respiratory distress.     Breath sounds: Normal breath sounds. No decreased breath sounds, wheezing or rales.  Chest:     Chest wall: No tenderness.  Abdominal:     General: There is no distension.     Palpations: Abdomen is soft.     Tenderness: There is no abdominal tenderness.  Musculoskeletal:     Cervical back: Normal range of motion.  Skin:    General: Skin is warm and dry.     Nails: There is no clubbing.  Neurological:     Mental Status: He is alert and oriented to person, place, and time.  Psychiatric:        Speech: Speech normal.        Behavior: Behavior normal.        Thought Content: Thought content normal.        Judgment: Judgment normal.     Wt Readings from Last 3 Encounters:  04/17/20 204 lb (92.5 kg)  01/24/20 201 lb 5 oz (91.3 kg)  12/11/19 195 lb 12.8 oz (88.8 kg)     ASSESSMENT & PLAN:   1. CAD involving the native coronary arteries without angina: He is doing well without any symptoms concerning for angina.  Continue secondary prevention with aspirin, Brilinta, amlodipine, atorvastatin, metoprolol, and lisinopril.  No indication for ischemic testing at this time.  2. PAD/carotid artery disease/abdominal aortic dilatation: Stable on most recent noninvasive imaging.  Carotid artery ultrasound showed a patent left-sided CEA.  Abdominal imaging showed no evidence of AAA.  Followed by vascular surgery.  3. HTN: Blood pressure is mildly elevated in the office today at triage and on recheck.  Titrate amlodipine to 5 mg daily.  He will otherwise continue current dose of HCTZ, lisinopril, and metoprolol.  Low-sodium diet recommended.  He will contact us if his BP continues to run in the 140s to 150s systolic.  Could consider transition from metoprolol  to carvedilol if BP remains elevated.  4. HLD: LDL 56 from 01/2020 with normal LFT at that time.  He remains on atorvastatin.  Disposition: F/u with Dr. Mariah MillingGollan or an APP in 12 months, sooner if needed.   Medication Adjustments/Labs and Tests Ordered: Current medicines are reviewed at length with the patient today.  Concerns regarding medicines are outlined above. Medication changes, Labs and Tests ordered today are summarized above and listed in the Patient Instructions accessible in Encounters.   Signed, Eula Listenyan Steffon Gladu, PA-C 04/17/2020 8:20 AM     [facility] [place] [address] [place], [place] [address] [phone]

## 2020-04-17 ENCOUNTER — Other Ambulatory Visit: Payer: Self-pay

## 2020-04-17 ENCOUNTER — Encounter: Payer: Self-pay | Admitting: Physician Assistant

## 2020-04-17 ENCOUNTER — Ambulatory Visit: Payer: Medicare Other | Admitting: Physician Assistant

## 2020-04-17 VITALS — BP 174/86 | HR 67 | Ht 68.0 in | Wt 204.0 lb

## 2020-04-17 DIAGNOSIS — I2510 Atherosclerotic heart disease of native coronary artery without angina pectoris: Secondary | ICD-10-CM | POA: Diagnosis not present

## 2020-04-17 DIAGNOSIS — E785 Hyperlipidemia, unspecified: Secondary | ICD-10-CM

## 2020-04-17 DIAGNOSIS — I6522 Occlusion and stenosis of left carotid artery: Secondary | ICD-10-CM | POA: Diagnosis not present

## 2020-04-17 DIAGNOSIS — I739 Peripheral vascular disease, unspecified: Secondary | ICD-10-CM

## 2020-04-17 DIAGNOSIS — I10 Essential (primary) hypertension: Secondary | ICD-10-CM

## 2020-04-17 MED ORDER — AMLODIPINE BESYLATE 5 MG PO TABS: 5.0000 mg | ORAL_TABLET | Freq: Every day | ORAL | 3 refills | Status: DC

## 2020-04-17 NOTE — Patient Instructions (Signed)
Medication Instructions:  Your physician has recommended you make the following change in your medication:   INCREASE Amlodipine to 5 mg daily. An Rx has been sent to your pharmacy.  *If you need a refill on your cardiac medications before your next appointment, please call your pharmacy*   Lab Work: None ordered If you have labs (blood work) drawn today and your tests are completely normal, you will receive your results only by: Marland Kitchen MyChart Message (if you have MyChart) OR . A paper copy in the mail If you have any lab test that is abnormal or we need to change your treatment, we will call you to review the results.   Testing/Procedures: None ordered   Follow-Up: At [facility], you and your health needs are our priority.  As part of our continuing mission to provide you with exceptional heart care, we have created designated Provider Care Teams.  These Care Teams include your primary Cardiologist (physician) and Advanced Practice Providers (APPs -  Physician Assistants and Nurse Practitioners) who all work together to provide you with the care you need, when you need it.  We recommend signing up for the patient portal called "MyChart".  Sign up information is provided on this After Visit Summary.  MyChart is used to connect with patients for Virtual Visits (Telemedicine).  Patients are able to view lab/test results, encounter notes, upcoming appointments, etc.  Non-urgent messages can be sent to your provider as well.   To learn more about what you can do with MyChart, go to [email or web address].    Your next appointment:   Your physician wants you to follow-up in: 1 year You will receive a reminder letter in the mail two months in advance. If you don't receive a letter, please call our office to schedule the follow-up appointment.   The format for your next appointment:   In Person  Provider:   You may see Julien Nordmann, MD or one of the following Advanced Practice Providers  on your designated Care Team:    Nicolasa Ducking, NP  Eula Listen, PA-C  Marisue Ivan, PA-C  Cadence Fransico Michael, [place]    Other Instructions N/A

## 2020-04-23 ENCOUNTER — Other Ambulatory Visit: Payer: Self-pay | Admitting: Cardiovascular Disease

## 2020-04-23 MED ORDER — ATORVASTATIN CALCIUM 40 MG PO TABS: 40.0000 mg | ORAL_TABLET | Freq: Every day | ORAL | 0 refills | Status: DC

## 2020-04-23 NOTE — Telephone Encounter (Signed)
*  STAT* If patient is at the pharmacy, call can be transferred to refill team.   1. Which medications need to be refilled? (please list name of each medication and dose if known) atorvastatin 40 mg  2. Which pharmacy/location (including street and city if local pharmacy) is medication to be sent to? [facility] [address]  3. Do they need a 30 day or 90 day supply? 90

## 2020-04-23 NOTE — Telephone Encounter (Signed)
Rx sent to [facility] pharmacy per patient's request for Atorvastatin #90 and 0 refills.

## 2020-04-25 ENCOUNTER — Ambulatory Visit: Payer: Medicare Other

## 2020-04-27 ENCOUNTER — Other Ambulatory Visit: Payer: Self-pay | Admitting: Family Medicine

## 2020-04-27 DIAGNOSIS — E1151 Type 2 diabetes mellitus with diabetic peripheral angiopathy without gangrene: Secondary | ICD-10-CM

## 2020-04-28 ENCOUNTER — Other Ambulatory Visit: Payer: Self-pay | Admitting: Family Medicine

## 2020-04-28 ENCOUNTER — Telehealth: Payer: Self-pay | Admitting: Family Medicine

## 2020-04-28 DIAGNOSIS — E1151 Type 2 diabetes mellitus with diabetic peripheral angiopathy without gangrene: Secondary | ICD-10-CM

## 2020-04-28 NOTE — Telephone Encounter (Signed)
Dr. Para March said" patient has a lab visit only scheduled,  please get the A1c done at the lab visit and schedule a phone or video follow up.  Thanks" I left a detailed message on patient's voice mail and asked patient to call back and schedule phone visit or virtual with Dr.Duncan.

## 2020-04-29 ENCOUNTER — Encounter: Payer: Self-pay | Admitting: Family Medicine

## 2020-04-29 NOTE — Telephone Encounter (Signed)
Mandy,  I know this has nothing to do with [place] but who would we direct this message to?

## 2020-05-01 ENCOUNTER — Other Ambulatory Visit: Payer: Medicare Other

## 2020-05-04 ENCOUNTER — Other Ambulatory Visit (INDEPENDENT_AMBULATORY_CARE_PROVIDER_SITE_OTHER): Payer: Medicare Other

## 2020-05-04 ENCOUNTER — Other Ambulatory Visit: Payer: Self-pay

## 2020-05-04 DIAGNOSIS — E1151 Type 2 diabetes mellitus with diabetic peripheral angiopathy without gangrene: Secondary | ICD-10-CM | POA: Diagnosis not present

## 2020-05-04 LAB — POCT GLYCOSYLATED HEMOGLOBIN (HGB A1C): Hemoglobin A1C: 8.5 % — AB (ref 4.0–5.6)

## 2020-05-06 DIAGNOSIS — H43813 Vitreous degeneration, bilateral: Secondary | ICD-10-CM | POA: Diagnosis not present

## 2020-05-06 DIAGNOSIS — H353211 Exudative age-related macular degeneration, right eye, with active choroidal neovascularization: Secondary | ICD-10-CM | POA: Diagnosis not present

## 2020-05-06 DIAGNOSIS — H353123 Nonexudative age-related macular degeneration, left eye, advanced atrophic without subfoveal involvement: Secondary | ICD-10-CM | POA: Diagnosis not present

## 2020-05-07 NOTE — Telephone Encounter (Signed)
I am forwarding over the the COVID vaccine scheduling manager so they can investigate further and be sure there were no breakdowns.   Thanks for making me aware.

## 2020-05-14 ENCOUNTER — Ambulatory Visit: Payer: Medicare Other | Admitting: Family Medicine

## 2020-05-14 ENCOUNTER — Other Ambulatory Visit: Payer: Self-pay

## 2020-05-14 ENCOUNTER — Encounter: Payer: Self-pay | Admitting: Family Medicine

## 2020-05-14 VITALS — BP 130/70 | HR 62 | Temp 95.1°F | Ht 68.0 in | Wt 202.8 lb

## 2020-05-14 DIAGNOSIS — E1151 Type 2 diabetes mellitus with diabetic peripheral angiopathy without gangrene: Secondary | ICD-10-CM | POA: Diagnosis not present

## 2020-05-14 DIAGNOSIS — Z23 Encounter for immunization: Secondary | ICD-10-CM

## 2020-05-14 MED ORDER — LANTUS SOLOSTAR 100 UNIT/ML ~~LOC~~ SOPN: PEN_INJECTOR | SUBCUTANEOUS | Status: DC

## 2020-05-14 NOTE — Progress Notes (Signed)
This visit occurred during the SARS-CoV-2 public health emergency.  Safety protocols were in place, including screening questions prior to the visit, additional usage of staff PPE, and extensive cleaning of exam room while observing appropriate contact time as indicated for disinfecting solutions.  Diabetes:  Using medications without difficulties: 45 units lantus.  Glipizide BID, metformin BID Hypoglycemic episodes: no Hyperglycemic episodes: no Feet problems: still with some tingling at baseline.  No skin breakdown.   Blood Sugars averaging: ~200 on recent checks.  Some days it was lower, down to 150 but less recently.   eye exam within last year: d/w pt.   A1c slightly higher at 8.5.   He is still working on diet.    Meds, vitals, and allergies reviewed.  ROS: Per HPI unless specifically indicated in ROS section   GEN: nad, alert and oriented HEENT: ncat NECK: supple w/o LA CV: rrr. PULM: ctab, no inc wob ABD: soft, +bs EXT: no edema SKIN: no acute rash

## 2020-05-14 NOTE — Patient Instructions (Addendum)
If your sugar is above 160, then add 1 unit.  If below 120, then cut back 1 unit.   If 120 to 160, then continue as is.    Keep working on diet and exercise.  Plan on recheck in about 3 months.    Thanks for your effort.  Take care.  Glad to see you.

## 2020-05-15 ENCOUNTER — Telehealth: Payer: Self-pay | Admitting: Family Medicine

## 2020-05-15 NOTE — Chronic Care Management (AMB) (Signed)
  Chronic Care Management   Note  05/15/2020 Name: [name] MRN: [id number] DOB: 10-12-1945  [name] is a 74 y.o. year old male who is a primary care patient of Joaquim Nam, MD. I reached out to [name] by phone today in response to a referral sent by [name]'s PCP, Joaquim Nam, MD.   [name] was given information about Chronic Care Management services today including:  1. CCM service includes personalized support from designated clinical staff supervised by his physician, including individualized plan of care and coordination with other care providers 2. 24/7 contact phone numbers for assistance for urgent and routine care needs. 3. Service will only be billed when office clinical staff spend 20 minutes or more in a month to coordinate care. 4. Only one practitioner may furnish and bill the service in a calendar month. 5. The patient may stop CCM services at any time (effective at the end of the month) by phone call to the office staff.   Burna Mortimer verbally agreed to assistance and services provided by embedded care coordination/care management team today.  Follow up plan:   Aggie Hacker  Upstream Scheduler

## 2020-05-17 NOTE — Assessment & Plan Note (Signed)
A1c slightly higher at 8.5.   He is still working on diet.   Discussed options.  Would adjust insulin.  If sugar is above 160, then add 1 unit.  If below 120, then cut back 1 unit.   If 120 to 160, then continue as is.    Keep working on diet and exercise.  Plan on recheck in about 3 months.

## 2020-05-18 ENCOUNTER — Other Ambulatory Visit: Payer: Self-pay | Admitting: Cardiovascular Disease

## 2020-06-17 ENCOUNTER — Telehealth: Payer: Self-pay

## 2020-06-17 NOTE — Chronic Care Management (AMB) (Signed)
Chronic Care Management Pharmacy Assistant   Name: [name]  MRN: [id number] DOB: 03/20/1946  Reason for Encounter: Medication Review   PCP : Tonia Ghent, MD  Allergies:   Allergies  Allergen Reactions  . Augmentin [Amoxicillin-Pot Clavulanate] Other (See Comments)    Nausea,vomiting,diarrhea  . Morphine And Related Nausea And Vomiting    Vomiting, GI upset  . Metformin And Related Other (See Comments)    Intolerant of 201m a day.     Medications: Outpatient Encounter Medications as of 06/17/2020  Medication Sig  . acetaminophen (TYLENOL) 325 MG tablet Take 1-2 tablets (325-650 mg total) by mouth every 4 (four) hours as needed for mild pain (or temp >/= 101 F).  .Marland Kitchenalbuterol (PROAIR HFA) 108 (90 BASE) MCG/ACT inhaler Inhale 2 puffs into the lungs 3 (three) times daily as needed (cough).  .Marland KitchenamLODipine (NORVASC) 5 MG tablet Take 1 tablet (5 mg total) by mouth daily.  .Marland Kitchenaspirin 81 MG EC tablet Take 1 tablet (81 mg total) by mouth daily.  .Marland Kitchenatorvastatin (LIPITOR) 40 MG tablet Take 1 tablet (40 mg total) by mouth daily.  . blood glucose meter kit and supplies KIT 1 each by Does not apply route daily as needed. Dispense based on patient and insurance preference. Use up to four times daily as directed. (FOR ICD-9 250.00, 250.01).  . BRILINTA 60 MG TABS tablet TAKE 1 TABLET BY MOUTH TWICE DAILY  . fenofibrate (TRICOR) 145 MG tablet TAKE 1 TABLET(145 MG) BY MOUTH DAILY  . Fluticasone-Salmeterol (ADVAIR DISKUS) 250-50 MCG/DOSE AEPB Inhale 1 puff into the lungs 2 (two) times daily.  .Marland KitchenglipiZIDE (GLUCOTROL) 5 MG tablet TAKE 1 TABLET(5 MG) BY MOUTH TWICE DAILY BEFORE A MEAL  . glucosamine-chondroitin 500-400 MG tablet Take 1 tablet by mouth 2 (two) times daily.  .Marland Kitchenglucose blood (ONETOUCH ULTRA) test strip Check sugar 2 times daily DX E11.9 with h/o blood sugar variation  . hydrochlorothiazide (HYDRODIURIL) 25 MG tablet Take 25 mg by mouth daily.  . insulin glargine (LANTUS  SOLOSTAR) 100 UNIT/ML Solostar Pen ADMINISTER 45-55 UNITS UNDER THE SKIN DAILY  . Insulin Pen Needle (B-D UF III MINI PEN NEEDLES) 31G X 5 MM MISC Use as instructed to inject insulin.  Diagnosis:  250.00  Insulin dependent.  .Marland Kitchenlisinopril (ZESTRIL) 40 MG tablet TAKE 1 TABLET BY MOUTH EVERY DAY  . loperamide (IMODIUM A-D) 2 MG tablet Take 1 tablet (2 mg total) by mouth 3 (three) times daily as needed for diarrhea or loose stools.  . meclizine (ANTIVERT) 25 MG tablet Take 25 mg by mouth every 6 (six) hours as needed. For dizziness/vertigo  . metFORMIN (GLUCOPHAGE) 500 MG tablet Take 1 tablet (500 mg total) by mouth 2 (two) times daily with a meal.  . metoprolol tartrate (LOPRESSOR) 25 MG tablet TAKE 1/2 TABLET BY MOUTH TWICE DAILY  . Multiple Vitamins-Minerals (PRESERVISION AREDS 2 PO) Take 1 tablet by mouth 2 (two) times daily.  . nitroGLYCERIN (NITROSTAT) 0.4 MG SL tablet Place 1 tablet (0.4 mg total) under the tongue every 5 (five) minutes as needed. For chest pain  . traMADol (ULTRAM) 50 MG tablet Take 1 tablet (50 mg total) by mouth 3 (three) times daily as needed.   No facility-administered encounter medications on file as of 06/17/2020.    Current Diagnosis: Patient Active Problem List   Diagnosis Date Noted  . Aortic ectasia, abdominal ([place] 07/17/2019  . Lower abdominal pain 06/27/2019  . Carotid artery stenosis without cerebral  infarction, left 09/21/2018  . Abnormal dreams 08/05/2018  . Creatinine elevation 08/05/2018  . Dysuria 01/14/2018  . CAD (coronary artery disease), native coronary artery 07/16/2017  . Shoulder pain 12/30/2016  . Skin lesion 12/30/2016  . Unstable angina ([place]) 09/03/2016  . AAA (abdominal aortic aneurysm) without rupture ([place]) 07/13/2016  . Popliteal artery aneurysm ([place]) 07/13/2016  . Carotid artery disease ([place]) 01/30/2015  . NSTEMI (non-ST elevated myocardial infarction) ([place]) 11/16/2014  . Medicare annual wellness visit, initial 10/21/2014  . Advance  care planning 10/21/2014  . PAD (peripheral artery disease) ([place]) 09/09/2014  . COPD exacerbation ([place]) 08/21/2014  . DM (diabetes mellitus), type 2 with peripheral vascular complications ([place]) 77/05/6578  . Back pain 02/24/2013  . Cough 01/26/2011  . COPD (chronic obstructive pulmonary disease) ([place]) 12/26/2010  . FATIGUE 05/11/2009  . Essential hypertension 05/08/2009  . Hyperlipidemia 06/20/2008   Have you seen any other providers since your last visit? No  Any changes in your medications or health? States Dr. Damita Dunnings Increased his insulin by 1 extra unit a night.  Any side effects from any medications? Denies any new side effects but "has had nausea from medications when he takes in the morning"   Do you have an symptoms or problems not managed by your medications? No  Any concerns about your health right now? Wife notes that she has some concerns about depression sense being "forcibly retired".  Has your provider asked that you check blood pressure, blood sugar, or follow special diet at home? Yes, Dr. Damita Dunnings has asked him to check blood pressure and blood sugar. He tries "to avoid sweets".  Do you get any type of exercise on a regular basis? Denies any formal exercise. Wife states he is very active around the house.  Can you think of a goal you would like to reach for your health? He would like to lower his BGL and keep it more steady than it has been.   Do you have any problems getting your medications? No  Is there anything that you would like to discuss during the appointment? Diabetes  Patient was reminded to have all medications and supplements available for review at time of appointment on 06/25/20 with Debbora Dus  Wife notes that he was "forcibly retired" a few months ago and is having a lot of trouble with it, possibly becoming depressed. He is "looking for another job" even if it is part time. Wife notes he is always doing something around the house trying to stay  busy.  Follow-Up:  Pharmacist Review   Debbora Dus, CPP notified  Margaretmary Dys, [facility] Pharmacy Assistant [phone]

## 2020-06-25 ENCOUNTER — Ambulatory Visit: Payer: Medicare Other

## 2020-06-28 ENCOUNTER — Other Ambulatory Visit: Payer: Self-pay | Admitting: Family Medicine

## 2020-07-07 ENCOUNTER — Other Ambulatory Visit: Payer: Self-pay | Admitting: Cardiovascular Disease

## 2020-07-13 ENCOUNTER — Other Ambulatory Visit (INDEPENDENT_AMBULATORY_CARE_PROVIDER_SITE_OTHER): Payer: Self-pay | Admitting: Vascular Surgery

## 2020-07-13 DIAGNOSIS — I6523 Occlusion and stenosis of bilateral carotid arteries: Secondary | ICD-10-CM

## 2020-07-13 DIAGNOSIS — I739 Peripheral vascular disease, unspecified: Secondary | ICD-10-CM

## 2020-07-13 DIAGNOSIS — I724 Aneurysm of artery of lower extremity: Secondary | ICD-10-CM

## 2020-07-13 DIAGNOSIS — I70202 Unspecified atherosclerosis of native arteries of extremities, left leg: Secondary | ICD-10-CM

## 2020-07-13 DIAGNOSIS — Z9889 Other specified postprocedural states: Secondary | ICD-10-CM

## 2020-07-16 ENCOUNTER — Ambulatory Visit (INDEPENDENT_AMBULATORY_CARE_PROVIDER_SITE_OTHER): Payer: Medicare Other

## 2020-07-16 ENCOUNTER — Ambulatory Visit (INDEPENDENT_AMBULATORY_CARE_PROVIDER_SITE_OTHER): Payer: Medicare Other | Admitting: Nurse Practitioner

## 2020-07-16 ENCOUNTER — Other Ambulatory Visit: Payer: Self-pay

## 2020-07-16 DIAGNOSIS — I739 Peripheral vascular disease, unspecified: Secondary | ICD-10-CM

## 2020-07-16 DIAGNOSIS — I724 Aneurysm of artery of lower extremity: Secondary | ICD-10-CM

## 2020-07-16 DIAGNOSIS — I70202 Unspecified atherosclerosis of native arteries of extremities, left leg: Secondary | ICD-10-CM

## 2020-07-16 DIAGNOSIS — Z9889 Other specified postprocedural states: Secondary | ICD-10-CM

## 2020-07-16 DIAGNOSIS — I6523 Occlusion and stenosis of bilateral carotid arteries: Secondary | ICD-10-CM | POA: Diagnosis not present

## 2020-07-24 ENCOUNTER — Encounter (INDEPENDENT_AMBULATORY_CARE_PROVIDER_SITE_OTHER): Payer: Self-pay | Admitting: *Deleted

## 2020-07-27 ENCOUNTER — Telehealth: Payer: Self-pay

## 2020-07-27 ENCOUNTER — Ambulatory Visit: Payer: Medicare Other

## 2020-07-27 ENCOUNTER — Other Ambulatory Visit: Payer: Self-pay

## 2020-07-27 DIAGNOSIS — I10 Essential (primary) hypertension: Secondary | ICD-10-CM

## 2020-07-27 DIAGNOSIS — E1151 Type 2 diabetes mellitus with diabetic peripheral angiopathy without gangrene: Secondary | ICD-10-CM

## 2020-07-27 MED ORDER — METFORMIN HCL 500 MG PO TABS: 500.0000 mg | ORAL_TABLET | Freq: Two times a day (BID) | ORAL | 3 refills | Status: DC

## 2020-07-27 NOTE — Chronic Care Management (AMB) (Signed)
Chronic Care Management Pharmacy  Name: MAXIM BEDEL  MRN: [id number] DOB: 1946-02-22  Chief Complaint/ HPI  Lutricia Horsfall,  75 y.o. , male presents for their Initial CCM visit with the clinical pharmacist via telephone.  PCP : Tonia Ghent, MD  Their chronic conditions include: Diabetes, HTN, HLD, COPD, CAD, PAD  CCM consent 05/15/20  Office Visits:  05/14/20: PCP visit - Diabetes, If your sugar is above 160, then add 1 unit. If below 120, then cut back 1 unit. If 120 to 160, then continue as is.    01/24/20: PCP visit - A1c is better but not all the way to goal. No change in medication at this point. Continue insulin Metformin and glipizide. LDL at goal. Triglycerides still elevated. That may improve with continued diabetic improvement. Continue atorvastatin and fenofibrate. He will update me as needed.  Consult Visit:  04/17/20: Cardiology - CAD continue  aspirin, Brilinta, amlodipine, atorvastatin, metoprolol, and lisinopril. PAD stable.  HTN: Blood pressure is mildly elevated in the office today at triage and on recheck. Home BP usually runs 140s/70s. Titrate amlodipine to 5 mg daily.   Allergies  Allergen Reactions  . Augmentin [Amoxicillin-Pot Clavulanate] Other (See Comments)    Nausea,vomiting,diarrhea  . Morphine And Related Nausea And Vomiting    Vomiting, GI upset  . Metformin And Related Other (See Comments)    Intolerant of 2067m a day.    Medications: Outpatient Encounter Medications as of 07/27/2020  Medication Sig  . acetaminophen (TYLENOL) 325 MG tablet Take 1-2 tablets (325-650 mg total) by mouth every 4 (four) hours as needed for mild pain (or temp >/= 101 F).  .Marland Kitchenalbuterol (PROAIR HFA) 108 (90 BASE) MCG/ACT inhaler Inhale 2 puffs into the lungs 3 (three) times daily as needed (cough).  .Marland KitchenamLODipine (NORVASC) 5 MG tablet Take 1 tablet (5 mg total) by mouth daily.  .Marland Kitchenaspirin 81 MG EC tablet Take 1 tablet (81 mg total) by mouth daily.  .Marland Kitchenatorvastatin  (LIPITOR) 40 MG tablet Take 1 tablet (40 mg total) by mouth daily.  . blood glucose meter kit and supplies KIT 1 each by Does not apply route daily as needed. Dispense based on patient and insurance preference. Use up to four times daily as directed. (FOR ICD-9 250.00, 250.01).  . BRILINTA 60 MG TABS tablet TAKE 1 TABLET BY MOUTH TWICE DAILY  . fenofibrate (TRICOR) 145 MG tablet TAKE 1 TABLET(145 MG) BY MOUTH DAILY  . Fluticasone-Salmeterol (ADVAIR DISKUS) 250-50 MCG/DOSE AEPB Inhale 1 puff into the lungs 2 (two) times daily.  .Marland KitchenglipiZIDE (GLUCOTROL) 5 MG tablet TAKE 1 TABLET(5 MG) BY MOUTH TWICE DAILY BEFORE A MEAL  . glucose blood (ONETOUCH ULTRA) test strip Check sugar 2 times daily DX E11.9 with h/o blood sugar variation  . hydrochlorothiazide (HYDRODIURIL) 25 MG tablet Take 25 mg by mouth daily.  . insulin glargine (LANTUS SOLOSTAR) 100 UNIT/ML Solostar Pen ADMINISTER 45-55 UNITS UNDER THE SKIN DAILY  . Insulin Pen Needle (B-D UF III MINI PEN NEEDLES) 31G X 5 MM MISC Use as instructed to inject insulin.  Diagnosis:  250.00  Insulin dependent.  .Marland Kitchenlisinopril (ZESTRIL) 40 MG tablet TAKE 1 TABLET BY MOUTH EVERY DAY  . meclizine (ANTIVERT) 25 MG tablet Take 25 mg by mouth every 6 (six) hours as needed. For dizziness/vertigo  . metFORMIN (GLUCOPHAGE) 500 MG tablet TAKE 2 TABLETS BY MOUTH TWICE DAILY WITH A MEAL (Patient taking differently: Take 500 mg by mouth 2 (two) times  daily with a meal.)  . metoprolol tartrate (LOPRESSOR) 25 MG tablet TAKE 1/2 TABLET BY MOUTH TWICE DAILY  . Multiple Vitamins-Minerals (PRESERVISION AREDS 2 PO) Take 1 tablet by mouth 2 (two) times daily.  . nitroGLYCERIN (NITROSTAT) 0.4 MG SL tablet Place 1 tablet (0.4 mg total) under the tongue every 5 (five) minutes as needed. For chest pain  . glucosamine-chondroitin 500-400 MG tablet Take 1 tablet by mouth 2 (two) times daily. (Patient not taking: Reported on 07/27/2020)  . traMADol (ULTRAM) 50 MG tablet Take 1 tablet (50 mg  total) by mouth 3 (three) times daily as needed.  . [DISCONTINUED] loperamide (IMODIUM A-D) 2 MG tablet Take 1 tablet (2 mg total) by mouth 3 (three) times daily as needed for diarrhea or loose stools. (Patient not taking: Reported on 07/27/2020)   No facility-administered encounter medications on file as of 07/27/2020.   Current Diagnosis/Assessment:  SDOH Interventions   Flowsheet Row Most Recent Value  SDOH Interventions   Financial Strain Interventions Intervention Not Indicated     Goals Addressed            This Visit's Progress   . Pharmacy Care Plan       CARE PLAN ENTRY (see longitudinal plan of care for additional care plan information)  Current Barriers:  . Chronic Disease Management support, education, and care coordination needs related to Hypertension and Diabetes   Hypertension BP Readings from Last 3 Encounters:  05/14/20 130/70  04/17/20 (!) 174/86  01/24/20 126/62   . Pharmacist Clinical Goal(s): o Over the next 6 months, patient will work with PharmD and providers to maintain BP goal <130/80 mmHg . Current regimen:   Amlodipine 5 mg - 1 tablet daily at bedtime   HCTZ 25 mg - 1 tablet daily  Metoprolol tartrate 25 mg - 1/2 tablet twice daily . Interventions: o Reviewed home BP monitoring - 120s/70s  . Patient self care activities - Over the next 6 months, patient will: o Check BP 2-3 days weekly, document, and provide at future appointments o Ensure daily salt intake < 2300 mg/day  Diabetes Lab Results  Component Value Date/Time   HGBA1C 8.5 (A) 05/04/2020 09:16 AM   HGBA1C 8.2 (H) 01/17/2020 08:17 AM   HGBA1C 9.4 (A) 10/18/2019 03:28 PM   HGBA1C 9.4 (H) 06/24/2019 12:14 PM   HGBA1C 7.1 (H) 10/25/2014 03:02 AM   . Pharmacist Clinical Goal(s): o Over the next 6 months, patient will work with PharmD and providers to achieve A1c goal <7% . Current regimen:   Lantus Solostar - Inject 45 units daily   Metformin 500 mg - 1 tablet twice  daily  Glipizide 5 mg - 1 tablet twice daily . Interventions: o Review home blood glucose log - fasting BG ranging 125-150 o Discuss scheduling annual eye exam  o Update metformin prescription per patient current dosing of 1 tablet twice daily o  . Patient self care activities - Over the next 6 months, patient will: o Check blood sugar once daily, document, and provide at future appointments o Contact provider with any episodes of hypoglycemia  Initial goal documentation      Hypertension   CMP Latest Ref Rng & Units 01/17/2020 11/22/2019 11/21/2019  Glucose 70 - 99 mg/dL 145(H) 135(H) 125(H)  BUN 6 - 23 mg/dL 16 18 20   Creatinine 0.40 - 1.50 mg/dL 1.24 1.41(H) 1.21  Sodium 135 - 145 mEq/L 141 136 139  Potassium 3.5 - 5.1 mEq/L 4.1 3.9 3.9  Chloride 96 -  112 mEq/L 105 103 103  CO2 19 - 32 mEq/L 27 24 25   Calcium 8.4 - 10.5 mg/dL 9.4 9.1 9.3  Total Protein 6.0 - 8.3 g/dL 6.1 7.1 -  Total Bilirubin 0.2 - 1.2 mg/dL 0.6 1.6(H) -  Alkaline Phos 39 - 117 U/L 34(L) 41 -  AST 0 - 37 U/L 10 14(L) -  ALT 0 - 53 U/L 11 12 -   Office blood pressures are: BP Readings from Last 3 Encounters:  05/14/20 130/70  04/17/20 (!) 174/86  01/24/20 126/62   BP goal < 130/80 mmHg Patient has failed these meds in the past: none  Patient checks BP at home: checks every 2-3 days  Patient home BP readings are ranging: 120s/70s  Patient is currently controlled on the following medications:   Amlodipine 5 mg - 1 tablet daily at bedtime   HCTZ 25 mg - 1 tablet daily  Metoprolol tartrate 25 mg - 1/2 tablet twice daily  07/27/20: BP well controlled on current therapy since amlodipine   Plan: Continue current medications  Hyperlipidemia/CAD   LDL goal < 70   Last lipids Lab Results  Component Value Date   CHOL 110 01/17/2020   HDL 24.30 (L) 01/17/2020   LDLCALC UNABLE TO CALCULATE IF TRIGLYCERIDE OVER 400 mg/dL 09/03/2016   LDLDIRECT 56.0 01/17/2020   TRIG 229.0 (H) 01/17/2020   CHOLHDL 5  01/17/2020   Hepatic Function Latest Ref Rng & Units 01/17/2020 11/22/2019 06/24/2019  Total Protein 6.0 - 8.3 g/dL 6.1 7.1 7.2  Albumin 3.5 - 5.2 g/dL 4.0 3.8 4.2  AST 0 - 37 U/L 10 14(L) 12  ALT 0 - 53 U/L 11 12 12   Alk Phosphatase 39 - 117 U/L 34(L) 41 38(L)  Total Bilirubin 0.2 - 1.2 mg/dL 0.6 1.6(H) 0.4  Bilirubin, Direct 0.0 - 0.3 mg/dL - - -     The ASCVD Risk score Mikey Bussing DC Jr., et al., 2013) failed to calculate for the following reasons:   The patient has a prior MI or stroke diagnosis   Patient has failed these meds in past: none reported Patient is currently uncontrolled on the following medications:  . Atorvastatin 40 mg - 1 tablet daily  . Fenofibrate 145 mg - 1 tablet daily  . Brilinta 60 mg - 1 tablet BID . Aspirin 81 mg - 1 tablet daily   We discussed:  LDL at goal, TG elevated likely secondary to diabetes uncontrolled. DAPT managed by cardiology due to CAD, PAD, stent.   Plan: Continue current medications  COPD / Tobacco   Eosinophil count:   Lab Results  Component Value Date/Time   EOSPCT 3.8 01/17/2020 08:17 AM   EOSPCT 2.7 10/28/2014 02:45 AM  %                               Eos (Absolute):  Lab Results  Component Value Date/Time   EOSABS 0.3 01/17/2020 08:17 AM   EOSABS 0.2 10/28/2014 02:45 AM   Tobacco Status:  Social History   Tobacco Use  Smoking Status Former Smoker  . Packs/day: 1.00  . Years: 40.00  . Pack years: 40.00  . Types: Cigarettes  . Quit date: 10/21/1998  . Years since quitting: 21.7  Smokeless Tobacco Never Used  Tobacco Comment   Past smoker, states he will smoke maybe 1 cigarette/ month or so still   Patient has failed these meds in past: Advair - changed by  pharmacy to [facility]  Patient is currently controlled on the following medications:   Wixela 250-50 mcg - 1 puff BID  Albuterol - PRN  Using maintenance inhaler regularly? No - using 1-2 days a week Frequency of rescue inhaler use:  infrequently - cant remember last  time he needed albuterol  07/27/20: Pt reports breathing is unlabored and well controlled on current therapy. Pt reports Advair was changed to [place] by pharmacy due to insurance.   Plan: Continue current medications  Diabetes   Recent Relevant Labs: Lab Results  Component Value Date/Time   HGBA1C 8.5 (A) 05/04/2020 09:16 AM   HGBA1C 8.2 (H) 01/17/2020 08:17 AM   HGBA1C 9.4 (A) 10/18/2019 03:28 PM   HGBA1C 9.4 (H) 06/24/2019 12:14 PM   HGBA1C 7.1 (H) 10/25/2014 03:02 AM    A1c goal < 7% (If able while avoiding hypoglycemia) Checking BG: Daily  Ranges 125-150 before breakfast, occasional higher, no lows Denies any readings < 100  Patient has failed these meds in past: none  Patient is currently controlled on the following medications:   Lantus Solostar - Inject 45 units daily   Metformin 500 mg - 1 tablet twice daily  Glipizide 5 mg - 1 tablet twice daily  Last diabetic foot exam: with last PCP exam  Last diabetic eye exam: encouraged patient to schedule annual   07/27/20: A1c elevated but fasting BG improved since last A1c. Pt has been increasing insulin by 1 unit daily to achieve fasting BG between 120-160 per PCP recommendation. Currently on Lantus 45 units daily and fasting BG is within goal. No hypoglycemia. Metformin dose on prescription differs from patient report. Pt taking 1 tablet twice daily, PCP aware, but need to update prescription to ensure adherence.   Plan: Continue current medications; Update metformin prescription to 500 mg - 1 tablet BID. Recommend continuing to monitor fasting BG daily. Call if any hypoglycemia. Reassess A1c 08/14/20 and follow up with PCP. If still above goal, consider lowering fasting goal to 100-130 and continuing to titrate insulin. CMA to review BG log in March.   Medication Management   Misc:  Meclizine - reports using 1-2 times a year for vertigo  Tylenol 325 mg - reports taking 2 tablets once daily for arthritis pain, works well for  pain relief  Nitroglycerin 0.4 mg - reports keeps in his pocket, has not needed in 5 years, counseled of indication and appropriate use  Patient's preferred pharmacy is:  [facility], [place] - [place] [address] [place] [address] Phone: [phone] Fax: [phone]  Uses pill box? Yes  Wife puts his medications in the pillbox - takes all medications at breakfast or bedtime  Pt endorses compliance  We discussed: Current pharmacy is preferred with insurance plan and patient is satisfied with pharmacy services  Plan  Continue current medication management strategy  Follow up: 6 month phone visit (01/25/21 at 10 AM); CMA DM log review March 2022  Debbora Dus, PharmD Clinical Pharmacist [facility] at [facility] [phone]

## 2020-07-27 NOTE — Telephone Encounter (Signed)
  Chronic Care Management   Outreach Note  07/27/2020 Name: ZAVION SLEIGHT MRN: [id number] DOB: Oct 13, 1945  Referred by: Joaquim Nam, MD  Unsuccessful Outreach for CCM  Attempted to reach patient by telephone for initial CCM visit 07/27/20 at 11 AM. Left voicemail with contact information.  Phil Dopp, PharmD Clinical Pharmacist [facility] [phone]

## 2020-07-27 NOTE — Telephone Encounter (Signed)
Sent. Thanks.   

## 2020-07-27 NOTE — Patient Instructions (Addendum)
Dear [name],  Below is a summary of the goals we discussed during our follow up appointment on July 27, 2020. Please contact me anytime with questions or concerns.   Visit Information  Goals Addressed            This Visit's Progress   . Pharmacy Care Plan       CARE PLAN ENTRY (see longitudinal plan of care for additional care plan information)  Current Barriers:  . Chronic Disease Management support, education, and care coordination needs related to Hypertension and Diabetes   Hypertension BP Readings from Last 3 Encounters:  05/14/20 130/70  04/17/20 (!) 174/86  01/24/20 126/62   . Pharmacist Clinical Goal(s): o Over the next 6 months, patient will work with PharmD and providers to maintain BP goal <130/80 mmHg . Current regimen:   Amlodipine 5 mg - 1 tablet daily at bedtime   HCTZ 25 mg - 1 tablet daily  Metoprolol tartrate 25 mg - 1/2 tablet twice daily . Interventions: o Reviewed home BP monitoring - 120s/70s  . Patient self care activities - Over the next 6 months, patient will: o Check BP 2-3 days weekly, document, and provide at future appointments o Ensure daily salt intake < 2300 mg/day  Diabetes Lab Results  Component Value Date/Time   HGBA1C 8.5 (A) 05/04/2020 09:16 AM   HGBA1C 8.2 (H) 01/17/2020 08:17 AM   HGBA1C 9.4 (A) 10/18/2019 03:28 PM   HGBA1C 9.4 (H) 06/24/2019 12:14 PM   HGBA1C 7.1 (H) 10/25/2014 03:02 AM   . Pharmacist Clinical Goal(s): o Over the next 6 months, patient will work with PharmD and providers to achieve A1c goal <7% . Current regimen:   Lantus Solostar - Inject 45 units daily   Metformin 500 mg - 1 tablet twice daily  Glipizide 5 mg - 1 tablet twice daily . Interventions: o Review home blood glucose log - fasting BG ranging 125-150 o Discuss scheduling annual eye exam  o Update metformin prescription per patient current dosing of 1 tablet twice daily o  . Patient self care activities - Over the next 6  months, patient will: o Check blood sugar once daily, document, and provide at future appointments o Contact provider with any episodes of hypoglycemia  Initial goal documentation       Patient verbalizes understanding of instructions provided today.  Telephone follow up appointment with pharmacy team member scheduled for: January 25, 2021 at 10 AM  Phil Dopp, PharmD Clinical Pharmacist [facility] at [facility] [phone]   Basics of Medicine Management Taking your medicines correctly is an important part of managing or preventing medical problems. Make sure you know what disease or condition your medicine is treating, and how and when to take it. If you do not take your medicine correctly, it may not work well and may cause unpleasant side effects, including serious health problems. What should I do when I am taking medicines?  Read all the labels and inserts that come with your medicines. Review the information often.  Talk with your pharmacist if you get a refill and notice a change in the size, color, or shape of your medicines.  Know the potential side effects for each medicine that you take.  Try to get all your medicines from the same pharmacy. The pharmacist will have all your information and will understand how your medicines will affect each other (interact).  Tell your health care provider about all your medicines, including over-the-counter medicines, vitamins,  and herbal or dietary supplements. He or she will make sure that nothing will interact with any of your prescribed medicines.   How can I take my medicines safely?  Take medicines only as told by your health care provider. ? Do not take more of your medicine than instructed. ? Do not take anyone else's medicines. ? Do not share your medicines with others. ? Do not stop taking your medicines unless your health care provider tells you to do so. ? You may need to avoid alcohol or certain foods or  liquids when taking certain medicines. Follow your health care provider's instructions.  Do not split, mash, or chew your medicines unless your health care provider tells you to do so. Tell your health care provider if you have trouble swallowing your medicines.  For liquid medicine, use the dosing container that was provided. How should I organize my medicines? Know your medicines  Know what each of your medicines looks like. This includes size, color, and shape. Tell your health care provider if you are having trouble recognizing all the medicines that you are taking.  If you cannot tell your medicines apart because they look similar, keep them in original bottles.  If you cannot read the labels on the bottles, tell your pharmacist to put your medicines in containers with large print.  Review your medicines and your schedule with family members, a friend, or a caregiver. Use a pill organizer  Use a tool to organize your medicine schedule. Tools include a weekly pillbox, a written chart, a notebook, or a calendar.  Your tool should help you remember the following things about each medicine: ? The name of the medicine. ? The amount (dose) to take. ? The schedule. This is the day and time the medicine should be taken. ? The appearance. This includes color, shape, size, and stamp. ? How to take your medicines. This includes instructions to take them with food, without food, with fluids, or with other medicines.  Create reminders for taking your medicines. Use sticky notes, or alarms on your watch, mobile device, or phone calendar.  You may choose to use a more advanced management system. These systems have storage, alarms, and visual and audio prompts.  Some medicines can be taken on an "as-needed" basis. These include medicines for nausea or pain. If you take an as-needed medicine, write down the name and dose, as well as the date and time that you took it.   How should I plan for  travel?  Take your pillbox, medicines, and organization system with you when traveling.  Have your medicines refilled before you travel. This will ensure that you do not run out of your medicines while you are away from home.  Always carry an updated list of your medicines with you. If there is an emergency, a first responder can quickly see what medicines you are taking.  Do not pack your medicines in checked luggage in case your luggage is lost or delayed.  If any of your medicines is considered a controlled substance, make sure you bring a letter from your health care provider with you. How should I store and discard my medicines? For safe storage:  Store medicines in a cool, dry area away from light, or as directed by your health care provider. Do not store medicines in the bathroom. Heat and humidity will affect them.  Do not store your medicines with other chemicals, or with medicines for pets or other household members.  Keep  medicines away from children and pets. Do not leave them on counters or bedside tables. Store them in high cabinets or on high shelves. For safe disposal:  Check expiration dates regularly. Do not take expired medicines. Discard medicines that are older than the expiration date.  Learn a safe way to dispose of your medicines. You may: ? Use a local government, hospital, or pharmacy medicine-take-back program. ? Mix the medicines with inedible substances, put them in a sealed bag or empty container, and throw them in the trash. What should I remember?  Tell your health care provider if you: ? Experience side effects. ? Have new symptoms. ? Have other concerns about taking your medicines.  Review your medicines regularly with your health care provider. Other medicines, diet, medical conditions, weight changes, and daily habits can all affect how medicines work. Ask if you need to continue taking each medicine, and discuss how well each one is  working.  Refill your medicines early to avoid running out of them.  In case of an accidental overdose, call your local Poison Control Center at 669-437-1808 or visit your local emergency department immediately. This is important. Summary  Taking your medicines correctly is an important part of managing or preventing medical problems.  You need to make sure that you understand what you are taking a medicine for, as well as how and when you need to take it.  Know your medicines and use a pill organizer to help you take your medicines correctly.  In case of an accidental overdose, call your local Poison Control Center at (989)343-0084 or visit your local emergency department immediately. This is important. This information is not intended to replace advice given to you by your health care provider. Make sure you discuss any questions you have with your health care provider. Document Revised: 06/22/2017 Document Reviewed: 06/22/2017 Elsevier Patient Education  2021 [employer].

## 2020-07-27 NOTE — Telephone Encounter (Signed)
Per chart review and patient report, patient is taking metformin 500 mg - 1 tablet twice daily (1000 mg total daily). Last prescription was sent in for 2 tablets BID. Would you mind sending an updated prescription to pharmacy to ensure accuracy and adherence?  Thank you,  Phil Dopp, PharmD Clinical Pharmacist [facility] at [facility] [phone]

## 2020-08-05 DIAGNOSIS — H2513 Age-related nuclear cataract, bilateral: Secondary | ICD-10-CM | POA: Diagnosis not present

## 2020-08-05 DIAGNOSIS — H353213 Exudative age-related macular degeneration, right eye, with inactive scar: Secondary | ICD-10-CM | POA: Diagnosis not present

## 2020-08-05 DIAGNOSIS — E119 Type 2 diabetes mellitus without complications: Secondary | ICD-10-CM | POA: Diagnosis not present

## 2020-08-05 DIAGNOSIS — H353123 Nonexudative age-related macular degeneration, left eye, advanced atrophic without subfoveal involvement: Secondary | ICD-10-CM | POA: Diagnosis not present

## 2020-08-07 ENCOUNTER — Other Ambulatory Visit: Payer: Self-pay | Admitting: Cardiovascular Disease

## 2020-08-10 ENCOUNTER — Telehealth: Payer: Self-pay | Admitting: Pharmacist

## 2020-08-10 NOTE — Progress Notes (Signed)
Chronic Care Management Pharmacy Assistant   Name: [name]  MRN: [id number] DOB: September 05, 1945  Reason for Encounter: Chart Review   PCP : Tonia Ghent, MD  Allergies:   Allergies  Allergen Reactions   Augmentin [Amoxicillin-Pot Clavulanate] Other (See Comments)    Nausea,vomiting,diarrhea   Morphine And Related Nausea And Vomiting    Vomiting, GI upset   Metformin And Related Other (See Comments)    Intolerant of 2078m a day.     Medications: Outpatient Encounter Medications as of 08/10/2020  Medication Sig   acetaminophen (TYLENOL) 325 MG tablet Take 1-2 tablets (325-650 mg total) by mouth every 4 (four) hours as needed for mild pain (or temp >/= 101 F).   albuterol (PROAIR HFA) 108 (90 BASE) MCG/ACT inhaler Inhale 2 puffs into the lungs 3 (three) times daily as needed (cough).   amLODipine (NORVASC) 5 MG tablet Take 1 tablet (5 mg total) by mouth daily.   aspirin 81 MG EC tablet Take 1 tablet (81 mg total) by mouth daily.   atorvastatin (LIPITOR) 40 MG tablet TAKE 1 TABLET(40 MG) BY MOUTH DAILY   blood glucose meter kit and supplies KIT 1 each by Does not apply route daily as needed. Dispense based on patient and insurance preference. Use up to four times daily as directed. (FOR ICD-9 250.00, 250.01).   BRILINTA 60 MG TABS tablet TAKE 1 TABLET BY MOUTH TWICE DAILY   fenofibrate (TRICOR) 145 MG tablet TAKE 1 TABLET(145 MG) BY MOUTH DAILY   Fluticasone-Salmeterol (ADVAIR DISKUS) 250-50 MCG/DOSE AEPB Inhale 1 puff into the lungs 2 (two) times daily.   glipiZIDE (GLUCOTROL) 5 MG tablet TAKE 1 TABLET(5 MG) BY MOUTH TWICE DAILY BEFORE A MEAL   glucosamine-chondroitin 500-400 MG tablet Take 1 tablet by mouth 2 (two) times daily. (Patient not taking: Reported on 07/27/2020)   glucose blood (ONETOUCH ULTRA) test strip Check sugar 2 times daily DX E11.9 with h/o blood sugar variation   hydrochlorothiazide (HYDRODIURIL) 25 MG tablet Take 25 mg by mouth daily.    insulin glargine (LANTUS SOLOSTAR) 100 UNIT/ML Solostar Pen ADMINISTER 45-55 UNITS UNDER THE SKIN DAILY   Insulin Pen Needle (B-D UF III MINI PEN NEEDLES) 31G X 5 MM MISC Use as instructed to inject insulin.  Diagnosis:  250.00  Insulin dependent.   lisinopril (ZESTRIL) 40 MG tablet TAKE 1 TABLET BY MOUTH EVERY DAY   meclizine (ANTIVERT) 25 MG tablet Take 25 mg by mouth every 6 (six) hours as needed. For dizziness/vertigo   metFORMIN (GLUCOPHAGE) 500 MG tablet Take 1 tablet (500 mg total) by mouth 2 (two) times daily with a meal.   metoprolol tartrate (LOPRESSOR) 25 MG tablet TAKE 1/2 TABLET BY MOUTH TWICE DAILY   Multiple Vitamins-Minerals (PRESERVISION AREDS 2 PO) Take 1 tablet by mouth 2 (two) times daily.   nitroGLYCERIN (NITROSTAT) 0.4 MG SL tablet Place 1 tablet (0.4 mg total) under the tongue every 5 (five) minutes as needed. For chest pain   traMADol (ULTRAM) 50 MG tablet Take 1 tablet (50 mg total) by mouth 3 (three) times daily as needed.   No facility-administered encounter medications on file as of 08/10/2020.    Current Diagnosis: Patient Active Problem List   Diagnosis Date Noted   Aortic ectasia, abdominal ([place] 07/17/2019   Lower abdominal pain 06/27/2019   Carotid artery stenosis without cerebral infarction, left 09/21/2018   Abnormal dreams 08/05/2018   Creatinine elevation 08/05/2018   Dysuria 01/14/2018   CAD (coronary artery disease),  native coronary artery 07/16/2017   Shoulder pain 12/30/2016   Skin lesion 12/30/2016   Unstable angina ([place]) 09/03/2016   AAA (abdominal aortic aneurysm) without rupture ([place]) 07/13/2016   Popliteal artery aneurysm ([place]) 07/13/2016   Carotid artery disease ([place]) 01/30/2015   NSTEMI (non-ST elevated myocardial infarction) ([place]) 11/16/2014   Medicare annual wellness visit, initial 10/21/2014   Advance care planning 10/21/2014   PAD (peripheral artery disease) ([place]) 09/09/2014   COPD exacerbation ([place])  08/21/2014   DM (diabetes mellitus), type 2 with peripheral vascular complications ([place]) 32/54/9826   Back pain 02/24/2013   Cough 01/26/2011   COPD (chronic obstructive pulmonary disease) ([place]) 12/26/2010   FATIGUE 05/11/2009   Essential hypertension 05/08/2009   Hyperlipidemia 06/20/2008    Goals Addressed   None     Follow-Up:  Pharmacist Review   Reviewed chart for medication changes and adherence.   No gaps in adherence identified. Patient has follow up scheduled with pharmacy team. No further action required.   Wendy Poet, [place] [phone]

## 2020-08-13 ENCOUNTER — Other Ambulatory Visit: Payer: Self-pay

## 2020-08-14 ENCOUNTER — Encounter: Payer: Self-pay | Admitting: Family Medicine

## 2020-08-14 ENCOUNTER — Other Ambulatory Visit: Payer: Self-pay

## 2020-08-14 ENCOUNTER — Ambulatory Visit (INDEPENDENT_AMBULATORY_CARE_PROVIDER_SITE_OTHER): Payer: Medicare Other | Admitting: Family Medicine

## 2020-08-14 VITALS — BP 142/80 | HR 68 | Temp 97.2°F | Ht 68.0 in | Wt 204.0 lb

## 2020-08-14 DIAGNOSIS — E1151 Type 2 diabetes mellitus with diabetic peripheral angiopathy without gangrene: Secondary | ICD-10-CM | POA: Diagnosis not present

## 2020-08-14 DIAGNOSIS — L989 Disorder of the skin and subcutaneous tissue, unspecified: Secondary | ICD-10-CM | POA: Diagnosis not present

## 2020-08-14 LAB — POCT GLYCOSYLATED HEMOGLOBIN (HGB A1C): Hemoglobin A1C: 9 % — AB (ref 4.0–5.6)

## 2020-08-14 NOTE — Patient Instructions (Addendum)
The spot should heal over.  We can recheck it in 3 months.   Use the grid and drop that off.  We'll go from there.   Add a unit of insulin if your AM sugar is above 130.   Plan on recheck in about 3 months.  A1c at the visit.    Take care.  Glad to see you.

## 2020-08-14 NOTE — Progress Notes (Signed)
This visit occurred during the SARS-CoV-2 public health emergency.  Safety protocols were in place, including screening questions prior to the visit, additional usage of staff PPE, and extensive cleaning of exam room while observing appropriate contact time as indicated for disinfecting solutions.  Diabetes:  Using medications without difficulties: still on 45 units insulin.  Metformin and glipizide BID at baseline.   Hypoglycemic episodes: no Hyperglycemic episodes: no Feet problems: no Blood Sugars averaging: 150-175 usually, occ down to 125 eye exam within last year: yes, had recent eye exam.  [facility].   He has been working on diet, A1c still up to 9.  D/w pt.    Lesion on L forearm, longstanding.  Gets irritated.    Meds, vitals, and allergies reviewed.   ROS: Per HPI unless specifically indicated in ROS section   GEN: nad, alert and oriented HEENT: mucous membranes moist NECK: supple w/o LA CV: rrr. PULM: ctab, no inc wob ABD: soft, +bs EXT: no edema SKIN: no acute rash but 3 lesions noted.  All look like SKs, but 1 is irritated.  1cm irritated, 1cm not irritated, 0.5cm not irritated Irritated SK treated x3 with liq N2.  No complications.  30 minutes were devoted to patient care in this encounter (this includes time spent reviewing the patient's file/history, interviewing and examining the patient, counseling/reviewing plan with patient).

## 2020-08-16 NOTE — Assessment & Plan Note (Signed)
We talked about options.  Looks like he has 3 seborrheic keratoses on his left forearm.  One appears irritated.  We talked about options for that one specifically.  Both sutures need observation.  We talked about shave biopsy versus cryotherapy.  Reasonable to proceed with cryotherapy.  Verbal informed consent obtained.  Treated x3 with liquid nitrogen without complication and routine instructions given to patient.  He is aware that we may need to retreat the area again in the future.  He will update me as needed.  We can recheck it on follow-up.

## 2020-08-16 NOTE — Assessment & Plan Note (Signed)
No change in medications at this point other than adjusting his insulin.  If his blood sugar is above 1:30 in the morning then I want him to add 1 unit of insulin.  I asked him to fill out a grid of pre and post meal sugars over the next few days and drop that off so we can make plans at that point.  He may have significant postprandial hyperglycemia that would dictate further changes in medication.  He agrees with plan.  See after visit summary.

## 2020-08-18 ENCOUNTER — Telehealth: Payer: Self-pay

## 2020-08-18 NOTE — Chronic Care Management (AMB) (Addendum)
Chronic Care Management Pharmacy Assistant   Name: [name]  MRN: [id number] DOB: Nov 13, 1945  Reason for Encounter: Patient Assistance- Lantus  PCP : Tonia Ghent, MD  Allergies:   Allergies  Allergen Reactions   Augmentin [Amoxicillin-Pot Clavulanate] Other (See Comments)    Nausea,vomiting,diarrhea   Morphine And Related Nausea And Vomiting    Vomiting, GI upset   Metformin And Related Other (See Comments)    Intolerant of 2028m a day.     Medications: Outpatient Encounter Medications as of 08/18/2020  Medication Sig   acetaminophen (TYLENOL) 325 MG tablet Take 1-2 tablets (325-650 mg total) by mouth every 4 (four) hours as needed for mild pain (or temp >/= 101 F).   albuterol (PROAIR HFA) 108 (90 BASE) MCG/ACT inhaler Inhale 2 puffs into the lungs 3 (three) times daily as needed (cough).   amLODipine (NORVASC) 5 MG tablet Take 1 tablet (5 mg total) by mouth daily.   aspirin 81 MG EC tablet Take 1 tablet (81 mg total) by mouth daily.   atorvastatin (LIPITOR) 40 MG tablet TAKE 1 TABLET(40 MG) BY MOUTH DAILY   blood glucose meter kit and supplies KIT 1 each by Does not apply route daily as needed. Dispense based on patient and insurance preference. Use up to four times daily as directed. (FOR ICD-9 250.00, 250.01).   BRILINTA 60 MG TABS tablet TAKE 1 TABLET BY MOUTH TWICE DAILY   fenofibrate (TRICOR) 145 MG tablet TAKE 1 TABLET(145 MG) BY MOUTH DAILY   Fluticasone-Salmeterol (ADVAIR DISKUS) 250-50 MCG/DOSE AEPB Inhale 1 puff into the lungs 2 (two) times daily.   glipiZIDE (GLUCOTROL) 5 MG tablet TAKE 1 TABLET(5 MG) BY MOUTH TWICE DAILY BEFORE A MEAL   glucosamine-chondroitin 500-400 MG tablet Take 1 tablet by mouth 2 (two) times daily.   glucose blood (ONETOUCH ULTRA) test strip Check sugar 2 times daily DX E11.9 with h/o blood sugar variation   hydrochlorothiazide (HYDRODIURIL) 25 MG tablet Take 25 mg by mouth daily.   insulin glargine (LANTUS SOLOSTAR) 100 UNIT/ML  Solostar Pen ADMINISTER 45-55 UNITS UNDER THE SKIN DAILY   Insulin Pen Needle (B-D UF III MINI PEN NEEDLES) 31G X 5 MM MISC Use as instructed to inject insulin.  Diagnosis:  250.00  Insulin dependent.   lisinopril (ZESTRIL) 40 MG tablet TAKE 1 TABLET BY MOUTH EVERY DAY   meclizine (ANTIVERT) 25 MG tablet Take 25 mg by mouth every 6 (six) hours as needed. For dizziness/vertigo   metFORMIN (GLUCOPHAGE) 500 MG tablet Take 1 tablet (500 mg total) by mouth 2 (two) times daily with a meal.   metoprolol tartrate (LOPRESSOR) 25 MG tablet TAKE 1/2 TABLET BY MOUTH TWICE DAILY   Multiple Vitamins-Minerals (PRESERVISION AREDS 2 PO) Take 1 tablet by mouth 2 (two) times daily.   nitroGLYCERIN (NITROSTAT) 0.4 MG SL tablet Place 1 tablet (0.4 mg total) under the tongue every 5 (five) minutes as needed. For chest pain   traMADol (ULTRAM) 50 MG tablet Take 1 tablet (50 mg total) by mouth 3 (three) times daily as needed.   No facility-administered encounter medications on file as of 08/18/2020.    Current Diagnosis: Patient Active Problem List   Diagnosis Date Noted   Aortic ectasia, abdominal ([place] 07/17/2019   Lower abdominal pain 06/27/2019   Carotid artery stenosis without cerebral infarction, left 09/21/2018   Abnormal dreams 08/05/2018   Creatinine elevation 08/05/2018   Dysuria 01/14/2018   CAD (coronary artery disease), native coronary artery 07/16/2017  Shoulder pain 12/30/2016   Skin lesion 12/30/2016   Unstable angina ([place]) 09/03/2016   AAA (abdominal aortic aneurysm) without rupture ([place]) 07/13/2016   Popliteal artery aneurysm ([facility]) 07/13/2016   Carotid artery disease ([place]) 01/30/2015   NSTEMI (non-ST elevated myocardial infarction) ([place]) 11/16/2014   Medicare annual wellness visit, initial 10/21/2014   Advance care planning 10/21/2014   PAD (peripheral artery disease) ([place]) 09/09/2014   COPD exacerbation ([place]) 08/21/2014   DM (diabetes mellitus), type 2 with peripheral vascular  complications ([place]) 69/97/8020   Back pain 02/24/2013   Cough 01/26/2011   COPD (chronic obstructive pulmonary disease) ([place]) 12/26/2010   FATIGUE 05/11/2009   Essential hypertension 05/08/2009   Hyperlipidemia 06/20/2008    Contacted Mr. Geiger to inquire if he would be interested in applying for patient assistance for his Lantus. Dr. Damita Dunnings expressed concern that Lantus was very costly for patient. Patient states he is interested in this and states he will come by Dr. Josefine Class office 08/20/20 to sign paperwork and provide income documentation.  Follow-Up:  Patient Assistance Coordination and Pharmacist Review  Debbora Dus, CPP notified  Margaretmary Dys, [place] Assistant [phone]  I have reviewed the care management and care coordination activities outlined in this encounter and I am certifying that I agree with the content of this note. Application sent to front office.   Debbora Dus, PharmD Clinical Pharmacist [facility] at [facility] [phone]

## 2020-08-20 ENCOUNTER — Other Ambulatory Visit: Payer: Self-pay | Admitting: Cardiovascular Disease

## 2020-08-20 NOTE — Telephone Encounter (Signed)
Patient came in office and filled out paperwork and provided copy of income. Placed in Michelle's box.

## 2020-08-26 ENCOUNTER — Other Ambulatory Visit: Payer: Self-pay | Admitting: Family Medicine

## 2020-09-03 ENCOUNTER — Telehealth: Payer: Self-pay

## 2020-09-03 NOTE — Telephone Encounter (Signed)
Pt called to report that he received a letter from patient assistance stating that he was approved and that it can take 2-3 weeks for the insulin to be received in our office... Pt states that he may have 5-7days worth of insulin left at the moment and would like to make sure that we call him as soon as it is received as he may run out.

## 2020-09-08 NOTE — Telephone Encounter (Signed)
No medications have been received yet; will try to call today to get status of shipment

## 2020-09-08 NOTE — Telephone Encounter (Signed)
[name], please contact the program for an update on his application.

## 2020-09-09 NOTE — Telephone Encounter (Signed)
Medications have been received and patient has been notified that they are ready for pick up.

## 2020-09-09 NOTE — Telephone Encounter (Signed)
Patient came in office and picked up medications.

## 2020-09-09 NOTE — Telephone Encounter (Signed)
Per chart, patient picked up Lantus from our office on 09/09/20. No further action required.  Phil Dopp, PharmD Clinical Pharmacist [place] [facility] at [facility] [phone]

## 2020-09-09 NOTE — Chronic Care Management (AMB) (Addendum)
Contacted [employer] regarding patients approval on Lanus. Patient has been approved 08/27/20 - 07/10/21    Follow-Up:  Patient Assistance Coordination and Pharmacist Review  Phil Dopp, CPP notified  Jomarie Longs, [facility] Clinical Pharmacy Assistant [phone]  Total time spent for month: 20

## 2020-09-11 ENCOUNTER — Telehealth: Payer: Self-pay

## 2020-09-11 NOTE — Chronic Care Management (AMB) (Addendum)
Chronic Care Management Pharmacy Assistant   Name: [name]  MRN: [id number] DOB: 08-13-1945  Reason for Encounter: Disease State- Blood glucose log update  PCP : Tonia Ghent, MD  Allergies:   Allergies  Allergen Reactions   Augmentin [Amoxicillin-Pot Clavulanate] Other (See Comments)    Nausea,vomiting,diarrhea   Morphine And Related Nausea And Vomiting    Vomiting, GI upset   Metformin And Related Other (See Comments)    Intolerant of 2059m a day.     Medications: Outpatient Encounter Medications as of 09/11/2020  Medication Sig   acetaminophen (TYLENOL) 325 MG tablet Take 1-2 tablets (325-650 mg total) by mouth every 4 (four) hours as needed for mild pain (or temp >/= 101 F).   albuterol (PROAIR HFA) 108 (90 BASE) MCG/ACT inhaler Inhale 2 puffs into the lungs 3 (three) times daily as needed (cough).   amLODipine (NORVASC) 5 MG tablet Take 1 tablet (5 mg total) by mouth daily.   aspirin 81 MG EC tablet Take 1 tablet (81 mg total) by mouth daily.   atorvastatin (LIPITOR) 40 MG tablet TAKE 1 TABLET(40 MG) BY MOUTH DAILY   blood glucose meter kit and supplies KIT 1 each by Does not apply route daily as needed. Dispense based on patient and insurance preference. Use up to four times daily as directed. (FOR ICD-9 250.00, 250.01).   BRILINTA 60 MG TABS tablet TAKE 1 TABLET BY MOUTH TWICE DAILY   fenofibrate (TRICOR) 145 MG tablet TAKE 1 TABLET(145 MG) BY MOUTH DAILY   Fluticasone-Salmeterol (ADVAIR DISKUS) 250-50 MCG/DOSE AEPB Inhale 1 puff into the lungs 2 (two) times daily.   glipiZIDE (GLUCOTROL) 5 MG tablet TAKE 1 TABLET(5 MG) BY MOUTH TWICE DAILY BEFORE A MEAL   glucosamine-chondroitin 500-400 MG tablet Take 1 tablet by mouth 2 (two) times daily.   glucose blood (ONETOUCH ULTRA) test strip Check sugar 2 times daily DX E11.9 with h/o blood sugar variation   hydrochlorothiazide (HYDRODIURIL) 25 MG tablet Take 25 mg by mouth daily.   insulin glargine (LANTUS SOLOSTAR)  100 UNIT/ML Solostar Pen ADMINISTER 45-55 UNITS UNDER THE SKIN DAILY   Insulin Pen Needle (B-D UF III MINI PEN NEEDLES) 31G X 5 MM MISC Use as instructed to inject insulin.  Diagnosis:  250.00  Insulin dependent.   lisinopril (ZESTRIL) 40 MG tablet TAKE 1 TABLET BY MOUTH EVERY DAY   meclizine (ANTIVERT) 25 MG tablet Take 25 mg by mouth every 6 (six) hours as needed. For dizziness/vertigo   metFORMIN (GLUCOPHAGE) 500 MG tablet Take 1 tablet (500 mg total) by mouth 2 (two) times daily with a meal.   metoprolol tartrate (LOPRESSOR) 25 MG tablet TAKE 1/2 TABLET BY MOUTH TWICE DAILY   Multiple Vitamins-Minerals (PRESERVISION AREDS 2 PO) Take 1 tablet by mouth 2 (two) times daily.   nitroGLYCERIN (NITROSTAT) 0.4 MG SL tablet Place 1 tablet (0.4 mg total) under the tongue every 5 (five) minutes as needed. For chest pain   traMADol (ULTRAM) 50 MG tablet Take 1 tablet (50 mg total) by mouth 3 (three) times daily as needed.   No facility-administered encounter medications on file as of 09/11/2020.    Current Diagnosis: Patient Active Problem List   Diagnosis Date Noted   Aortic ectasia, abdominal ([place] 07/17/2019   Lower abdominal pain 06/27/2019   Carotid artery stenosis without cerebral infarction, left 09/21/2018   Abnormal dreams 08/05/2018   Creatinine elevation 08/05/2018   Dysuria 01/14/2018   CAD (coronary artery disease), native coronary artery  07/16/2017   Shoulder pain 12/30/2016   Skin lesion 12/30/2016   Unstable angina ([place]) 09/03/2016   AAA (abdominal aortic aneurysm) without rupture ([place]) 07/13/2016   Popliteal artery aneurysm ([place]) 07/13/2016   Carotid artery disease ([place]) 01/30/2015   NSTEMI (non-ST elevated myocardial infarction) ([place]) 11/16/2014   Medicare annual wellness visit, initial 10/21/2014   Advance care planning 10/21/2014   PAD (peripheral artery disease) ([place]) 09/09/2014   COPD exacerbation ([place]) 08/21/2014   DM (diabetes mellitus), type 2 with peripheral vascular  complications ([place]) 67/54/4920   Back pain 02/24/2013   Cough 01/26/2011   COPD (chronic obstructive pulmonary disease) ([place]) 12/26/2010   FATIGUE 05/11/2009   Essential hypertension 05/08/2009   Hyperlipidemia 06/20/2008    Recent Relevant Labs: Lab Results  Component Value Date/Time   HGBA1C 9.0 (A) 08/14/2020 08:28 AM   HGBA1C 8.5 (A) 05/04/2020 09:16 AM   HGBA1C 8.2 (H) 01/17/2020 08:17 AM   HGBA1C 9.4 (H) 06/24/2019 12:14 PM   HGBA1C 7.1 (H) 10/25/2014 03:02 AM    Kidney Function Lab Results  Component Value Date/Time   CREATININE 1.24 01/17/2020 08:17 AM   CREATININE 1.41 (H) 11/22/2019 09:52 AM   CREATININE 1.32 (H) 10/29/2014 09:08 AM   CREATININE 1.40 (H) 10/28/2014 02:45 AM   CREATININE 0.96 01/28/2011 08:55 AM   CREATININE 1.16 01/07/2011 08:35 AM   GFR 56.87 (L) 01/17/2020 08:17 AM   GFRNONAA 49 (L) 11/22/2019 09:52 AM   GFRNONAA 55 (L) 10/29/2014 09:08 AM   GFRAA 56 (L) 11/22/2019 09:52 AM   GFRAA >60 10/29/2014 09:08 AM    Reviewed chart prior to disease state call. Spoke with patient regarding blood glucose  Current antihyperglycemic regimen:  Lantus Solostar 100 units/mL 45-50 units ( [name] is using 50 Units) Metformin 500 mg  BID Glipizde 5 mg BID Verbally confirms adherence to above medications? Yes  How often are you checking your blood sugar? twice daily  What are your blood sugars ranging?   Fasting:  09/11/20- 241 09/01/20- 259 08/31/20- 243  Before meals: N/A After meals:  09/01/20-270 08/31/20- 281 363- does not remember date Bedtime: NA  On insulin? Yes- Lantus How many units: 50 What time of day? Evening.    Adherence Review: Is the patient currently on a STATIN medication? Yes Is the patient currently on ACE/ARB medication? Yes Does the patient have >5 day gap between last estimated fill dates? CPP to review  Patient states [name] is going to increase how often [name] checks his blood sugar- to about 4 times a day and wants to bring it to  Dr. Damita Dunnings early next week. Patient is very concerned about elevated sugars. [name] states [name] noticed it was high shortly after appointment with Dr. Damita Dunnings. [name] states [name] is diligent with his diet and avoids eating high sugar or starchy foods.   Follow-Up:  Pharmacist Review  Debbora Dus, CPP notified  Margaretmary Dys, [place] Assistant [phone]  -------  Contacted patient to review BG log. See addendum.   Debbora Dus, PharmD Clinical Pharmacist [facility] at [facility] [phone]  Total time spent for month: 61

## 2020-09-15 ENCOUNTER — Other Ambulatory Visit: Payer: Self-pay | Admitting: Cardiovascular Disease

## 2020-09-16 NOTE — Telephone Encounter (Signed)
Rx request sent to pharmacy.  

## 2020-09-23 NOTE — Telephone Encounter (Signed)
Called patient to discuss high BG readings.  He confirms the following medications, denies any changes: Metformin 500 mg - 1 tablet twice daily Glipizide 5 mg - 1 tablet twice daily Lantus - Inject 50 units daily  His test strips are brand new. Checking 4x times day. Denies any steroids or illness. Denies any change in diet or exercise.   His fasting BG has been consistently in 200s for the past month. He has not scheduled PCP visit as he kept expecting his numbers to go back down. Denies any hypoglycemia.   Plan: Recommend increase Lantus by 3 units to 53 units daily. (Per prescription PCP okay with 45-55 units). Provided my phone number for him to call with any hypo or hyperglycemia.  Follow up: CMA call in 3 weeks for BG log review  Phil Dopp, PharmD Clinical Pharmacist [facility] at [facility] [phone]

## 2020-09-28 ENCOUNTER — Other Ambulatory Visit: Payer: Self-pay | Admitting: Family Medicine

## 2020-10-06 ENCOUNTER — Telehealth: Payer: Self-pay | Admitting: Family Medicine

## 2020-10-06 NOTE — Telephone Encounter (Signed)
Patient came in office and dropped off glucose readings. Placed on cart for PCP's review.

## 2020-10-07 MED ORDER — LANTUS SOLOSTAR 100 UNIT/ML ~~LOC~~ SOPN: PEN_INJECTOR | SUBCUTANEOUS | Status: DC

## 2020-10-07 NOTE — Telephone Encounter (Signed)
Please thank patient for the update.  His previous morning sugars were all around 190 or higher.  His morning sugars do appear to be improving in the meantime.  I see that he increased his insulin and I think that likely is helping.  I would not make major changes at this point.  If his blood sugar is above 150 in the morning then I would add 1 unit to his insulin dose.  If his blood sugar is below 100, then I would cut back by 1 unit.  If his blood sugar is 100-150 in the morning, then I would not make any change to his overall dose that day.  I think if he uses those targets he may have to add a unit of insulin a few times over the next week or two but I would expect his sugars to stabilize and his insulin dose should level off.  Please have him update me about his sugar in about 2 weeks and we will go from there.  If his morning sugars continue to stay reasonably low, then I would expect his next A1c to be better.  I thank him for his effort.

## 2020-10-07 NOTE — Telephone Encounter (Signed)
Spoke with patient about message below. Patient agrees with plan below but wants to make sure Dr. Para March is aware he is already up to 63 units. He wants to know if there is a limit to how much he ends up taking of insulin/max dose?

## 2020-10-07 NOTE — Telephone Encounter (Signed)
I get his point.  There is not a maximum dose of insulin-this is highly variable from patient to patient.  Some of the pens have a maximum amount that can be delivered in a single shot, but if his total dose is above that he can divide the dose into 2 separate shots/injections.  If he needed to give 65 units, he could give 1 shot of 30 units and then another shot of 35 units in a separate area.  He can still do this at the same time, but I would use a clean needle with each injection.  Let me know if he has other questions or if this does not make sense to the patient.  I will updated his total dose on his med list here.  Thanks.

## 2020-10-07 NOTE — Telephone Encounter (Signed)
Spoke with patient and advised on below. Patient verbalized understanding and will update clinic in 2 weeks with readings.

## 2020-11-04 DIAGNOSIS — H353213 Exudative age-related macular degeneration, right eye, with inactive scar: Secondary | ICD-10-CM | POA: Diagnosis not present

## 2020-11-12 ENCOUNTER — Other Ambulatory Visit: Payer: Self-pay

## 2020-11-12 ENCOUNTER — Encounter: Payer: Self-pay | Admitting: Family Medicine

## 2020-11-12 ENCOUNTER — Telehealth: Payer: Self-pay

## 2020-11-12 ENCOUNTER — Ambulatory Visit (INDEPENDENT_AMBULATORY_CARE_PROVIDER_SITE_OTHER): Payer: Medicare Other | Admitting: Family Medicine

## 2020-11-12 VITALS — BP 122/72 | HR 57 | Temp 97.8°F | Ht 68.0 in | Wt 201.0 lb

## 2020-11-12 DIAGNOSIS — E1151 Type 2 diabetes mellitus with diabetic peripheral angiopathy without gangrene: Secondary | ICD-10-CM

## 2020-11-12 LAB — POCT GLYCOSYLATED HEMOGLOBIN (HGB A1C): Hemoglobin A1C: 8.9 % — AB (ref 4.0–5.6)

## 2020-11-12 MED ORDER — LANTUS SOLOSTAR 100 UNIT/ML ~~LOC~~ SOPN: PEN_INJECTOR | SUBCUTANEOUS | 4 refills | Status: DC

## 2020-11-12 MED ORDER — LANTUS SOLOSTAR 100 UNIT/ML ~~LOC~~ SOPN: PEN_INJECTOR | SUBCUTANEOUS | Status: DC

## 2020-11-12 NOTE — Progress Notes (Signed)
This visit occurred during the SARS-CoV-2 public health emergency.  Safety protocols were in place, including screening questions prior to the visit, additional usage of staff PPE, and extensive cleaning of exam room while observing appropriate contact time as indicated for disinfecting solutions.  Diabetes:  Using medications without difficulties:no ADE on metformin and insulin Hypoglycemic episodes: no Hyperglycemic episodes: no Feet problems: no Blood Sugars averaging: almost always >150, occ above 200 in the AM.   eye exam within last year: done last week re: macular degeneration.   Diet d/w pt.  D/w pt about cutting back on potatoes.    Meds, vitals, and allergies reviewed.  ROS: Per HPI unless specifically indicated in ROS section   GEN: nad, alert and oriented HEENT: ncat NECK: supple w/o LA CV: rrr. PULM: ctab, no inc wob ABD: soft, +bs EXT: no edema SKIN: no acute rash

## 2020-11-12 NOTE — Patient Instructions (Addendum)
Go one week without potatoes and see how your sugar is running.  Let me know next week.  Take care.  Glad to see you. Plan on recheck in about 3 months at a yearly visit with labs ahead of time.

## 2020-11-12 NOTE — Telephone Encounter (Signed)
Called patient assistance program they are going to fax from over to fill out for change in sig for medications. She said to make sure you fill out #3 correctly that is where you will put the new sig.

## 2020-11-12 NOTE — Telephone Encounter (Signed)
-----   Message from Joaquim Nam, MD sent at 11/12/2020  9:35 AM EDT ----- Please check on insulin pens.  I sent the rx but he got his last set here.  What is the situation with patient assistance?

## 2020-11-13 NOTE — Telephone Encounter (Signed)
Forms have been filled out and faxed back

## 2020-11-15 NOTE — Assessment & Plan Note (Signed)
Discussed options.  No change in medications at this point. I want him to go one week without potatoes and see how his sugar is running.  He can let me know next week.  We can make plans at that point. Plan on recheck in about 3 months at a yearly visit with labs ahead of time.

## 2020-11-17 NOTE — Telephone Encounter (Signed)
Lantus has been received and patient informed it is ready for pick up.

## 2020-11-18 NOTE — Telephone Encounter (Signed)
Patient came to office and picked up medication.

## 2020-11-26 ENCOUNTER — Telehealth: Payer: Self-pay | Admitting: Family Medicine

## 2020-11-26 NOTE — Telephone Encounter (Signed)
Please update patient.  I thank him for his effort.  Sugar is clearly better on follow up readings after cutting out potatoes.  I would continue as is, assuming he can stick with it.  Please update me as needed.  I wouldn't change his meds at this point.  If he continues as is, I would expect his next A1c to be better.  Thanks.

## 2020-11-27 NOTE — Telephone Encounter (Signed)
Patient was informed.  Patient understood and no questions, comments, or concerns at this time.  

## 2020-12-08 ENCOUNTER — Telehealth: Payer: Self-pay

## 2020-12-08 NOTE — Progress Notes (Addendum)
Chronic Care Management Pharmacy Assistant   Name: [name]  MRN: [id number] DOB: Nov 25, 1945   Reason for Encounter: Disease State - DM   Recent office visits:  11/26/20 - Patient call - BG improving, continue avoiding potatoes 11/12/20-  PCP - Continue current medications. Go one week without potatoes and see how your sugar is running.  Let me know next week.  Plan on recheck in about 3 months   Recent consult visits:  None since last CCM contact  Hospital visits:  None in previous 6 months  Medications: Outpatient Encounter Medications as of 12/08/2020  Medication Sig   acetaminophen (TYLENOL) 325 MG tablet Take 1-2 tablets (325-650 mg total) by mouth every 4 (four) hours as needed for mild pain (or temp >/= 101 F).   albuterol (PROAIR HFA) 108 (90 BASE) MCG/ACT inhaler Inhale 2 puffs into the lungs 3 (three) times daily as needed (cough).   amLODipine (NORVASC) 5 MG tablet Take 1 tablet (5 mg total) by mouth daily.   aspirin 81 MG EC tablet Take 1 tablet (81 mg total) by mouth daily.   atorvastatin (LIPITOR) 40 MG tablet TAKE 1 TABLET(40 MG) BY MOUTH DAILY   blood glucose meter kit and supplies KIT 1 each by Does not apply route daily as needed. Dispense based on patient and insurance preference. Use up to four times daily as directed. (FOR ICD-9 250.00, 250.01).   BRILINTA 60 MG TABS tablet TAKE 1 TABLET BY MOUTH TWICE DAILY   fenofibrate (TRICOR) 145 MG tablet TAKE 1 TABLET(145 MG) BY MOUTH DAILY   Fluticasone-Salmeterol (ADVAIR DISKUS) 250-50 MCG/DOSE AEPB Inhale 1 puff into the lungs 2 (two) times daily.   glucosamine-chondroitin 500-400 MG tablet Take 1 tablet by mouth 2 (two) times daily.   glucose blood (ONETOUCH ULTRA) test strip Check sugar 2 times daily DX E11.9 with h/o blood sugar variation   insulin glargine (LANTUS SOLOSTAR) 100 UNIT/ML Solostar Pen ADMINISTER 90-100 UNITS UNDER THE SKIN DAILY   Insulin Pen Needle (B-D UF III MINI PEN NEEDLES) 31G X 5 MM MISC  Use as instructed to inject insulin.  Diagnosis:  250.00  Insulin dependent.   lisinopril (ZESTRIL) 40 MG tablet TAKE 1 TABLET BY MOUTH EVERY DAY   meclizine (ANTIVERT) 25 MG tablet Take 25 mg by mouth every 6 (six) hours as needed. For dizziness/vertigo   metFORMIN (GLUCOPHAGE) 500 MG tablet Take 1 tablet (500 mg total) by mouth 2 (two) times daily with a meal.   metoprolol tartrate (LOPRESSOR) 25 MG tablet TAKE 1/2 TABLET BY MOUTH TWICE DAILY   Multiple Vitamins-Minerals (PRESERVISION AREDS 2 PO) Take 1 tablet by mouth 2 (two) times daily.   nitroGLYCERIN (NITROSTAT) 0.4 MG SL tablet Place 1 tablet (0.4 mg total) under the tongue every 5 (five) minutes as needed. For chest pain   No facility-administered encounter medications on file as of 12/08/2020.    Recent Relevant Labs: Lab Results  Component Value Date/Time   HGBA1C 8.9 (A) 11/12/2020 09:12 AM   HGBA1C 9.0 (A) 08/14/2020 08:28 AM   HGBA1C 8.2 (H) 01/17/2020 08:17 AM   HGBA1C 9.4 (H) 06/24/2019 12:14 PM   HGBA1C 7.1 (H) 10/25/2014 03:02 AM    Kidney Function Lab Results  Component Value Date/Time   CREATININE 1.24 01/17/2020 08:17 AM   CREATININE 1.41 (H) 11/22/2019 09:52 AM   CREATININE 1.32 (H) 10/29/2014 09:08 AM   CREATININE 1.40 (H) 10/28/2014 02:45 AM   CREATININE 0.96 01/28/2011 08:55 AM   CREATININE  1.16 01/07/2011 08:35 AM   GFR 56.87 (L) 01/17/2020 08:17 AM   GFRNONAA 49 (L) 11/22/2019 09:52 AM   GFRNONAA 55 (L) 10/29/2014 09:08 AM   GFRAA 56 (L) 11/22/2019 09:52 AM   GFRAA >60 10/29/2014 09:08 AM    Current antihyperglycemic regimen:  Lantus 100unit/ml  - Inject 80 units daily Glipizide 56m - 1 tablet 2 times daily  Metformin 5069m -1 tablet 2 times daily   Patient verbally confirms he is taking the above medications as directed. Yes - currently he is using 80 units of lantus daily   What recent interventions/DTPs have been made to improve glycemic control:  Per Dr.Duncan the patient may add 1 unit in  the morning if sugar is > 150 on 11/12/20. Avoid potatoes.   Have there been any recent hospitalizations or ED visits since last visit with CPP? No  Patient denies hypoglycemic symptoms, including Pale, Sweaty, Shaky, Hungry, Nervous/irritable and Vision changes  Patient denies hyperglycemic symptoms, including blurry vision, excessive thirst, fatigue, polyuria and weakness  How often are you checking your blood sugar? in the morning before eating or drinking  Fasting:       11/16/20  115     11/29/20  99 11/17/20  92     11/30/20 200 11/18/20  125   12/01/20 174 11/19/20  127   12/02/20 172 11/20/20  130   12/03/20 186 11/22/20  155   12/04/20 159 11/23/20  153   12/05/20 177 11/24/20  164   12/06/20 164 11/25/20  156   12/07/20 186 11/26/20  96     12/08/20 213 11/27/20  161 11/28/20  170  On insulin? Yes - The patient states he is using 80 units of Lantus daily  During the week, how often does your blood glucose drop below 70? Never  Are you checking your feet daily/regularly? Yes  Adherence Review: Is the patient currently on a STATIN medication? Yes Is the patient currently on ACE/ARB medication? No Does the patient have >5 day gap between last estimated fill dates? Yes - glipizide and metformin   Star Rating Drugs:  Medication:  Last Fill:  Atorvastatin 4037m  11/02/20    90DS Glipizide 5mg31m        08/27/20    90DS Metformin 500mg12m  08/31/19    90DS  Follow-Up:  Pharmacist Review  MicheDebbora Dus notified  VelmeAvel Sensor [facility] Medical Corporationical Pharmacy Assistant [phone] reviewed the care management and care coordination activities outlined in this encounter and I am certifying that I agree with the content of this note. BG trending upward. Review BG log in 2 weeks.   MicheDebbora DusrmD Clinical Pharmacist [phone]

## 2020-12-28 ENCOUNTER — Other Ambulatory Visit: Payer: Self-pay | Admitting: Family Medicine

## 2021-01-04 ENCOUNTER — Telehealth: Payer: Self-pay

## 2021-01-04 NOTE — Chronic Care Management (AMB) (Addendum)
Chronic Care Management Pharmacy Assistant   Name: [name]  MRN: [id number] DOB: 1946/04/09  Reason for Encounter: Disease State  DM  Recent office visits:  None since last CCM contact  Recent consult visits:  None since last CCM contact  Hospital visits:  None in previous 6 months  Medications: Outpatient Encounter Medications as of 01/04/2021  Medication Sig   acetaminophen (TYLENOL) 325 MG tablet Take 1-2 tablets (325-650 mg total) by mouth every 4 (four) hours as needed for mild pain (or temp >/= 101 F).   albuterol (PROAIR HFA) 108 (90 BASE) MCG/ACT inhaler Inhale 2 puffs into the lungs 3 (three) times daily as needed (cough).   amLODipine (NORVASC) 5 MG tablet Take 1 tablet (5 mg total) by mouth daily.   aspirin 81 MG EC tablet Take 1 tablet (81 mg total) by mouth daily.   atorvastatin (LIPITOR) 40 MG tablet TAKE 1 TABLET(40 MG) BY MOUTH DAILY   blood glucose meter kit and supplies KIT 1 each by Does not apply route daily as needed. Dispense based on patient and insurance preference. Use up to four times daily as directed. (FOR ICD-9 250.00, 250.01).   BRILINTA 60 MG TABS tablet TAKE 1 TABLET BY MOUTH TWICE DAILY   fenofibrate (TRICOR) 145 MG tablet TAKE 1 TABLET(145 MG) BY MOUTH DAILY   Fluticasone-Salmeterol (ADVAIR DISKUS) 250-50 MCG/DOSE AEPB Inhale 1 puff into the lungs 2 (two) times daily.   glucosamine-chondroitin 500-400 MG tablet Take 1 tablet by mouth 2 (two) times daily.   glucose blood (ONETOUCH ULTRA) test strip Check sugar 2 times daily DX E11.9 with h/o blood sugar variation   insulin glargine (LANTUS SOLOSTAR) 100 UNIT/ML Solostar Pen ADMINISTER 90-100 UNITS UNDER THE SKIN DAILY   Insulin Pen Needle (B-D UF III MINI PEN NEEDLES) 31G X 5 MM MISC Use as instructed to inject insulin.  Diagnosis:  250.00  Insulin dependent.   lisinopril (ZESTRIL) 40 MG tablet TAKE 1 TABLET BY MOUTH EVERY DAY   meclizine (ANTIVERT) 25 MG tablet Take 25 mg by mouth every 6  (six) hours as needed. For dizziness/vertigo   metFORMIN (GLUCOPHAGE) 500 MG tablet TAKE 2 TABLETS BY MOUTH TWICE DAILY WITH A MEAL   metoprolol tartrate (LOPRESSOR) 25 MG tablet TAKE 1/2 TABLET BY MOUTH TWICE DAILY   Multiple Vitamins-Minerals (PRESERVISION AREDS 2 PO) Take 1 tablet by mouth 2 (two) times daily.   nitroGLYCERIN (NITROSTAT) 0.4 MG SL tablet Place 1 tablet (0.4 mg total) under the tongue every 5 (five) minutes as needed. For chest pain   No facility-administered encounter medications on file as of 01/04/2021.   Recent Relevant Labs: Lab Results  Component Value Date/Time   HGBA1C 8.9 (A) 11/12/2020 09:12 AM   HGBA1C 9.0 (A) 08/14/2020 08:28 AM   HGBA1C 8.2 (H) 01/17/2020 08:17 AM   HGBA1C 9.4 (H) 06/24/2019 12:14 PM   HGBA1C 7.1 (H) 10/25/2014 03:02 AM    Kidney Function Lab Results  Component Value Date/Time   CREATININE 1.24 01/17/2020 08:17 AM   CREATININE 1.41 (H) 11/22/2019 09:52 AM   CREATININE 1.32 (H) 10/29/2014 09:08 AM   CREATININE 1.40 (H) 10/28/2014 02:45 AM   CREATININE 0.96 01/28/2011 08:55 AM   CREATININE 1.16 01/07/2011 08:35 AM   GFR 56.87 (L) 01/17/2020 08:17 AM   GFRNONAA 49 (L) 11/22/2019 09:52 AM   GFRNONAA 55 (L) 10/29/2014 09:08 AM   GFRAA 56 (L) 11/22/2019 09:52 AM   GFRAA >60 10/29/2014 09:08 AM   Current antihyperglycemic  regimen:  Lantus 100unit/ml  - Inject 90 units daily (at bedtime) Metformin 542m  -1 tablet 2 times daily (1 at morning, 1 at bedtime)   Patient verbally confirms he is taking the above medications as directed. Yes The patient reports he takes insulin at bedtime and will take metformin 1 in the morning and 1 at bedtime and he has not missed any doses of medication  What recent interventions/DTPs have been made to improve glycemic control: The patient reports he has stopped potatoes.The patient reports his BG's have improved since he quit potatoes  Have there been any recent hospitalizations or ED visits since last  visit with CPP? No  Patient denies hypoglycemic symptoms, including Pale, Sweaty, Shaky, Hungry, Nervous/irritable, and Vision changes  Patient denies hyperglycemic symptoms, including blurry vision, excessive thirst, fatigue, polyuria, and weakness  How often are you checking your blood sugar? in the morning before eating or drinking  What are your blood sugars ranging?  Fasting:  12/09/20  183  12/23/20   170    12/10/20  181  12/24/20   113    12/11/20  167  12/25/20   141    12/14/20  179  12/27/20   200    12/15/20  156  01/02/21   161    12/16/20  150  01/03/21   201    12/17/20   138  01/04/21   198   12/18/20   206   12/19/20   204   12/20/20   155   12/21/20   147   12/22/20   220 Before meals: n/a After meals: n/a Bedtime: n/a  On insulin? Yes How many units:Lantus 100unit/ml  - Inject 90 units daily( at bedtime)  During the week, how often does your blood glucose drop below 70? Never  Are you checking your feet daily/regularly? Yes  Adherence Review: Is the patient currently on a STATIN medication? Yes Is the patient currently on ACE/ARB medication? Yes Does the patient have >5 day gap between last estimated fill dates? No  Star Rating Drugs:  Medication:  Last Fill: Day Supply Metformin 5038m6/20/22 90 Atorvastatin 4060m/25/22 90 Lisinopril 24m6m5/22  90  Follow-Up:  Pharmacist Review  MichDebbora DusP notified  VelmAvel [facility]ensor[facility]ical Pharm[facility] Assistant [phone] reviewed the care management and care coordination activities outlined in this encounter and I am certifying that I agree with the content of this note. See addendum.  MichDebbora DusarmD Clinical Pharmacist [facility]-[phone]

## 2021-01-06 NOTE — Telephone Encounter (Signed)
Please call and confirm if patient is taking glipizide - last CCM call in may he reported yes, taking glipizide. Although it appears it was discontinued by PCP 09/28/20. Also please confirm his current metformin dose and is he able to tolerate 2 tablets twice daily? This is what his current prescription if for. If able to tolerate, please take 2 tablets (1000 mg) twice daily. Also please ask if he has increased or changed his insulin dose recently. Last month he reported taking 80 units and this month 90 units daily.   Phil Dopp, PharmD Clinical Pharmacist  Primary Care at [facility] [phone]

## 2021-01-07 NOTE — Progress Notes (Addendum)
Phone call to [name] and he is taking Metformin 500mg  2 times daily and unable to increase his dose due to nausea and GI upset and per Dr.Duncan he was to go up to increase his Insulin to 90 units daily   , CPP notified  Phil Dopp, [facility] Clincal Pharmacy Assistant [phone]  Total time spent for month CPA: 5 mins  I have reviewed the care management and care coordination activities outlined in this encounter and I am certifying that I agree with the content of this note. Will discuss at follow up in July.   August, PharmD Clinical Pharmacist [place] [facility] at [facility] [phone]

## 2021-01-07 NOTE — Telephone Encounter (Signed)
Patient confirmed he is not taking glipizide at this time. It was discontinued in March.  Phil Dopp, PharmD Clinical Pharmacist [place] [facility] at [facility] [phone]

## 2021-01-10 ENCOUNTER — Other Ambulatory Visit: Payer: Self-pay | Admitting: Family Medicine

## 2021-01-19 ENCOUNTER — Telehealth: Payer: Self-pay

## 2021-01-19 NOTE — Chronic Care Management (AMB) (Addendum)
Chronic Care Management Pharmacy Assistant   Name: [name]  MRN: [id number] DOB: Dec 05, 1945  Reason for Encounter: Reminder call  Recent office visits:  None since lat CCM contact  Recent consult visits:  None since last CCM contact  Hospital visits:  None in previous 6 months  Medications: Outpatient Encounter Medications as of 01/19/2021  Medication Sig   acetaminophen (TYLENOL) 325 MG tablet Take 1-2 tablets (325-650 mg total) by mouth every 4 (four) hours as needed for mild pain (or temp >/= 101 F).   albuterol (PROAIR HFA) 108 (90 BASE) MCG/ACT inhaler Inhale 2 puffs into the lungs 3 (three) times daily as needed (cough).   amLODipine (NORVASC) 5 MG tablet Take 1 tablet (5 mg total) by mouth daily.   aspirin 81 MG EC tablet Take 1 tablet (81 mg total) by mouth daily.   atorvastatin (LIPITOR) 40 MG tablet TAKE 1 TABLET(40 MG) BY MOUTH DAILY   blood glucose meter kit and supplies KIT 1 each by Does not apply route daily as needed. Dispense based on patient and insurance preference. Use up to four times daily as directed. (FOR ICD-9 250.00, 250.01).   BRILINTA 60 MG TABS tablet TAKE 1 TABLET BY MOUTH TWICE DAILY   fenofibrate (TRICOR) 145 MG tablet TAKE 1 TABLET(145 MG) BY MOUTH DAILY   Fluticasone-Salmeterol (ADVAIR DISKUS) 250-50 MCG/DOSE AEPB Inhale 1 puff into the lungs 2 (two) times daily.   glucosamine-chondroitin 500-400 MG tablet Take 1 tablet by mouth 2 (two) times daily.   glucose blood (ONETOUCH ULTRA) test strip CHECK SUGAR TWICE DAILY   insulin glargine (LANTUS SOLOSTAR) 100 UNIT/ML Solostar Pen ADMINISTER 90-100 UNITS UNDER THE SKIN DAILY   Insulin Pen Needle (B-D UF III MINI PEN NEEDLES) 31G X 5 MM MISC Use as instructed to inject insulin.  Diagnosis:  250.00  Insulin dependent.   lisinopril (ZESTRIL) 40 MG tablet TAKE 1 TABLET BY MOUTH EVERY DAY   meclizine (ANTIVERT) 25 MG tablet Take 25 mg by mouth every 6 (six) hours as needed. For dizziness/vertigo    metFORMIN (GLUCOPHAGE) 500 MG tablet TAKE 2 TABLETS BY MOUTH TWICE DAILY WITH A MEAL   metoprolol tartrate (LOPRESSOR) 25 MG tablet TAKE 1/2 TABLET BY MOUTH TWICE DAILY   Multiple Vitamins-Minerals (PRESERVISION AREDS 2 PO) Take 1 tablet by mouth 2 (two) times daily.   nitroGLYCERIN (NITROSTAT) 0.4 MG SL tablet Place 1 tablet (0.4 mg total) under the tongue every 5 (five) minutes as needed. For chest pain   No facility-administered encounter medications on file as of 01/19/2021.  [name] was contacted to remind him of his upcoming telephone visit with Debbora Dus on 01/25/2021 at 10:00AM.  he was reminded to have all medications, supplements and any blood glucose and blood pressure readings available for review at appointment.   Are you having any problems with your medications? The patient reports no issues with his medications  What concerns would you like to discuss with the pharmacist? No primary health concern identified   Star Rating Drugs: Medication:  Last Fill: Day Supply Metformin 589m 12/28/20 90 Atorvastatin 452m4/25/22 90 Lisinopril 4064m/28/22 90   CCM appointment on 01/25/2021   MicDebbora DusPP notified  VelAvel SensorCMPassaicsistant [phone] have reviewed the care management and care coordination activities outlined in this encounter and I am certifying that I agree with the content of this note. No further action required.  MicDebbora DusharmD Clinical Pharmacist [facility] [facility]  Care at [place]

## 2021-01-25 ENCOUNTER — Ambulatory Visit (INDEPENDENT_AMBULATORY_CARE_PROVIDER_SITE_OTHER): Payer: Medicare Other

## 2021-01-25 ENCOUNTER — Other Ambulatory Visit: Payer: Self-pay

## 2021-01-25 DIAGNOSIS — E1151 Type 2 diabetes mellitus with diabetic peripheral angiopathy without gangrene: Secondary | ICD-10-CM | POA: Diagnosis not present

## 2021-01-25 DIAGNOSIS — I10 Essential (primary) hypertension: Secondary | ICD-10-CM

## 2021-01-25 NOTE — Progress Notes (Signed)
Chronic Care Management Pharmacy Note  01/25/21 Name:  [name] MRN:  [id number] DOB:  [birth year]  Subjective: [name] is an 75 y.o. year old male who is a primary patient of Damita Dunnings, Elveria Rising, MD.  The CCM team was consulted for assistance with disease management and care coordination needs.    Engaged with patient by telephone for follow up visit in response to provider referral for pharmacy case management and/or care coordination services.   Consent to Services:  The patient was given information about Chronic Care Management services, agreed to services, and gave verbal consent prior to initiation of services.  Please see initial visit note for detailed documentation.   Patient Care Team: Tonia Ghent, MD as PCP - General Rockey Situ Kathlene November, MD as PCP - Cardiology (Cardiology) Debbora Dus, [facility] (Pharmacist)  Recent office visits: 11/12/2020 - Dr. Damita Dunnings, PCP - Diabetes uncontrolled, cut back on potatoes. Follow up 3 months.   Recent consult visits: None in previous 6 months   Hospital visits: None in previous 6 months  Objective:  Lab Results  Component Value Date   CREATININE 1.24 01/17/2020   BUN 16 01/17/2020   GFR 56.87 (L) 01/17/2020   GFRNONAA 49 (L) 11/22/2019   GFRAA 56 (L) 11/22/2019   NA 141 01/17/2020   K 4.1 01/17/2020   CALCIUM 9.4 01/17/2020   CO2 27 01/17/2020   GLUCOSE 145 (H) 01/17/2020    Lab Results  Component Value Date/Time   HGBA1C 8.9 (A) 11/12/2020 09:12 AM   HGBA1C 9.0 (A) 08/14/2020 08:28 AM   HGBA1C 8.2 (H) 01/17/2020 08:17 AM   HGBA1C 9.4 (H) 06/24/2019 12:14 PM   HGBA1C 7.1 (H) 10/25/2014 03:02 AM   GFR 56.87 (L) 01/17/2020 08:17 AM   GFR 39.31 (L) 06/24/2019 12:14 PM    Last diabetic Eye exam: 11/2020 - macular degeneration  Last diabetic Foot exam:  12/26/19 normal  Lab Results  Component Value Date   CHOL 110 01/17/2020   HDL 24.30 (L) 01/17/2020   LDLCALC UNABLE TO CALCULATE IF TRIGLYCERIDE OVER 400  mg/dL 09/03/2016   LDLDIRECT 56.0 01/17/2020   TRIG 229.0 (H) 01/17/2020   CHOLHDL 5 01/17/2020    Hepatic Function Latest Ref Rng & Units 01/17/2020 11/22/2019 06/24/2019  Total Protein 6.0 - 8.3 g/dL 6.1 7.1 7.2  Albumin 3.5 - 5.2 g/dL 4.0 3.8 4.2  AST 0 - 37 U/L 10 14(L) 12  ALT 0 - 53 U/L 11 12 12   Alk Phosphatase 39 - 117 U/L 34(L) 41 38(L)  Total Bilirubin 0.2 - 1.2 mg/dL 0.6 1.6(H) 0.4  Bilirubin, Direct 0.0 - 0.3 mg/dL - - -    Lab Results  Component Value Date/Time   TSH 2.390 05/11/2009 09:25 PM    CBC Latest Ref Rng & Units 01/17/2020 11/22/2019 11/21/2019  WBC 4.0 - 10.5 K/uL 8.0 15.8(H) 13.3(H)  Hemoglobin 13.0 - 17.0 g/dL 14.2 14.5 14.6  Hematocrit 39.0 - 52.0 % 43.1 42.2 43.1  Platelets 150.0 - 400.0 K/uL 188.0 219 219   No results found for: VD25OH  Clinical ASCVD: Yes  The ASCVD Risk score Mikey Bussing DC Jr., et al., 2013) failed to calculate for the following reasons:   The patient has a prior MI or stroke diagnosis    Depression screen [facility] 2/9 05/14/2020 03/31/2017 12/29/2016  Decreased Interest 0 0 0  Down, Depressed, Hopeless 0 0 0  PHQ - 2 Score 0 0 0  Altered sleeping - 0 -  Tired,  decreased energy - 0 -  Change in appetite - 0 -  Feeling bad or failure about yourself  - 0 -  Trouble concentrating - 0 -  Moving slowly or fidgety/restless - 0 -  Suicidal thoughts - 0 -  PHQ-9 Score - 0 -  Difficult doing work/chores - Not difficult at all -  Some recent data might be hidden    Social History   Tobacco Use  Smoking Status Former   Packs/day: 1.00   Years: 40.00   Pack years: 40.00   Types: Cigarettes   Quit date: 10/21/1998   Years since quitting: 22.3  Smokeless Tobacco Never  Tobacco Comments   Past smoker, states he will smoke maybe 1 cigarette/ month or so still   BP Readings from Last 3 Encounters:  02/05/21 128/79  11/12/20 122/72  08/14/20 (!) 142/80   Pulse Readings from Last 3 Encounters:  02/05/21 64  11/12/20 (!) 57  08/14/20 68    Wt Readings from Last 3 Encounters:  02/05/21 198 lb (89.8 kg)  11/12/20 201 lb (91.2 kg)  08/14/20 204 lb (92.5 kg)   BMI Readings from Last 3 Encounters:  02/05/21 30.11 kg/m  11/12/20 30.56 kg/m  08/14/20 31.02 kg/m   Assessment/Interventions: Review of patient past medical history, allergies, medications, health status, including review of consultants reports, laboratory and other test data, was performed as part of comprehensive evaluation and provision of chronic care management services.   SDOH:  (Social Determinants of Health) assessments and interventions performed: Yes SDOH Interventions    Flowsheet Row Most Recent Value  SDOH Interventions   Financial Strain Interventions Intervention Not Indicated      SDOH Screenings   Alcohol Screen: Not on file  Depression (PHQ2-9): Low Risk    PHQ-2 Score: 0  Financial Resource Strain: Low Risk    Difficulty of Paying Living Expenses: Not very hard  Food Insecurity: Not on file  Housing: Not on file  Physical Activity: Not on file  Social Connections: Not on file  Stress: Not on file  Tobacco Use: Medium Risk   Smoking Tobacco Use: Former   Smokeless Tobacco Use: Never  Transportation Needs: Not on file    [place]  Allergies  Allergen Reactions   Augmentin [Amoxicillin-Pot Clavulanate] Other (See Comments)    Nausea,vomiting,diarrhea   Morphine And Related Nausea And Vomiting    Vomiting, GI upset   Metformin And Related Other (See Comments)    Intolerant of 2038m a day.     Medications Reviewed Today     Reviewed by [facility]eb[facility]a [facility]us [facility](Ph[facility]ist) on 02/07/21 at 2208  Med List Status: <None>   Medication Order Taking? Sig Documenting Provider Last Dose Status Informant  acetaminophen (TYLENOL) 325 MG tablet [id number] Take 1-2 tablets (325-650 mg total) by mouth every 4 (four) hours as needed for mild pain (or temp >/= 101 F). CJuanna Cao MD Taking Active   albuterol ([facility]) 108  (90 BASE) MCG/ACT inhaler [id number] Inhale 2 puffs into the lungs 3 (three) times daily as needed (cough). DTonia Ghent MD Taking Active Spouse/Significant Other  amLODipine (NORVASC) 5 MG tablet [id number] Take 1 tablet (5 mg total) by mouth daily. DRise Mu PA-C Taking Active   aspirin 81 MG EC tablet [id number] Take 1 tablet (81 mg total) by mouth daily. GMinna Merritts MD Taking Active Spouse/Significant Other  atorvastatin (LIPITOR) 40 MG tablet [id number] TAKE 1 TABLET(40 MG) BY MOUTH  DAILY Minna Merritts, MD Taking Active   blood glucose meter kit and supplies KIT [id number] No 1 each by Does not apply route daily as needed. Dispense based on patient and insurance preference. Use up to four times daily as directed. (FOR ICD-9 250.00, 250.01). Tonia Ghent, MD Taking Active   BRILINTA 60 MG TABS tablet [id number] No TAKE 1 TABLET BY MOUTH TWICE DAILY Rockey Situ, Kathlene November, MD Taking Active   fenofibrate (TRICOR) 145 MG tablet [id number] No TAKE 1 TABLET(145 MG) BY MOUTH DAILY Gollan, Kathlene November, MD Taking Active   Fluticasone-Salmeterol (ADVAIR DISKUS) 250-50 MCG/DOSE AEPB [id number] No Inhale 1 puff into the lungs 2 (two) times daily. Tonia Ghent, MD Taking Active   glucosamine-chondroitin 500-400 MG tablet [id number] No Take 1 tablet by mouth 2 (two) times daily. [provider] Taking Active   glucose blood (ONETOUCH ULTRA) test strip [id number] No CHECK SUGAR TWICE DAILY Tonia Ghent, MD Taking Active   insulin glargine (LANTUS SOLOSTAR) 100 UNIT/ML Solostar Pen [id number] No ADMINISTER 90-100 UNITS UNDER THE SKIN DAILY Tonia Ghent, MD Taking Active   Insulin Pen Needle (B-D UF III MINI PEN NEEDLES) 31G X 5 MM MISC [id number] No Use as instructed to inject insulin.  Diagnosis:  250.00  Insulin dependent. Tonia Ghent, MD Taking Active   lisinopril (ZESTRIL) 40 MG tablet [id number] No TAKE 1 TABLET BY MOUTH EVERY DAY Gollan, Kathlene November, MD Taking Active    meclizine (ANTIVERT) 25 MG tablet [id number] No Take 25 mg by mouth every 6 (six) hours as needed. For dizziness/vertigo [provider] Taking Active Spouse/Significant Other  metFORMIN (GLUCOPHAGE) 500 MG tablet [id number] No TAKE 2 TABLETS BY MOUTH TWICE DAILY WITH A MEAL Tonia Ghent, MD Taking Active   metoprolol tartrate (LOPRESSOR) 25 MG tablet [id number] No TAKE 1/2 TABLET BY MOUTH TWICE DAILY Gollan, Kathlene November, MD Taking Active   Multiple Vitamins-Minerals (PRESERVISION AREDS 2 PO) [id number] No Take 1 tablet by mouth 2 (two) times daily. [provider] Taking Active Spouse/Significant Other  nitroGLYCERIN (NITROSTAT) 0.4 MG SL tablet [id number] No Place 1 tablet (0.4 mg total) under the tongue every 5 (five) minutes as needed. For chest pain Tonia Ghent, MD Taking Active             Patient Active Problem List   Diagnosis Date Noted   Aortic ectasia, abdominal ([place]) 07/17/2019   Lower abdominal pain 06/27/2019   Carotid artery stenosis without cerebral infarction, left 09/21/2018   Abnormal dreams 08/05/2018   Creatinine elevation 08/05/2018   Dysuria 01/14/2018   CAD (coronary artery disease), native coronary artery 07/16/2017   Shoulder pain 12/30/2016   Skin lesion 12/30/2016   Unstable angina ([place]) 09/03/2016   AAA (abdominal aortic aneurysm) without rupture ([place]) 07/13/2016   Popliteal artery aneurysm ([place]) 07/13/2016   Carotid artery disease ([place]) 01/30/2015   NSTEMI (non-ST elevated myocardial infarction) ([place]) 11/16/2014   Medicare annual wellness visit, initial 10/21/2014   Advance care planning 10/21/2014   PAD (peripheral artery disease) ([place]) 09/09/2014   COPD exacerbation ([place]) 08/21/2014   DM (diabetes mellitus), type 2 with peripheral vascular complications ([place]) 63/84/6659   Back pain 02/24/2013   Cough 01/26/2011   COPD (chronic obstructive pulmonary disease) ([place]) 12/26/2010   FATIGUE 05/11/2009   Essential hypertension  05/08/2009   Hyperlipidemia 06/20/2008    Immunization History  Administered Date(s) Administered   Fluad Quad(high Dose 65+) 05/14/2020   Influenza,inj,Quad PF,6+ Mos 07/16/2013, 04/21/2014, 06/02/2015, 06/10/2016,  03/31/2017, 08/02/2018   PFIZER(Purple Top)SARS-COV-2 Vaccination 08/17/2019, 09/07/2019, 04/25/2020   Pneumococcal Conjugate-13 10/20/2014   Pneumococcal Polysaccharide-23 12/29/2011   Td 01/19/2010    Conditions to be addressed/monitored:  Hypertension and Diabetes  Care Plan : [place]  Updates made by Debbora Dus, [facility] since 02/07/2021 12:00 AM     Problem: Disease Management   Priority: High  Onset Date: 01/25/2021  Note:   Current Barriers:  Unable to achieve control of diabetes   Pharmacist Clinical Goal(s):  Patient will contact provider office for questions/concerns as evidenced notation of same in electronic health record through collaboration with PharmD and provider.   Interventions: 1:1 collaboration with Tonia Ghent, MD regarding development and update of comprehensive plan of care as evidenced by provider attestation and co-signature Inter-disciplinary care team collaboration (see longitudinal plan of care) Comprehensive medication review performed; medication list updated in electronic medical record  Hypertension (BP goal <140/90) -Controlled - clinic BP within goal -Current treatment: Amlodipine 5 mg - 1 tablet daily at bedtime  HCTZ 25 mg - 1 tablet daily Metoprolol tartrate 25 mg - 1/2 tablet twice daily -Medications previously tried: none  -Current home readings: none reported -Denies hypotensive/hypertensive symptoms -Recommended to continue current medication  Diabetes (A1c goal <7%) -Not ideally controlled - Sugar goes up and down and he is not sure what causes it. He does have occasional ice cream or potatoes. He states he could go all day and not eat, BG still in 200s. He is active around the house, mows yard,  outside active daily. He has not been able to get BG < 150 more than 50% of the time despite increasing Lantus. Stopped his titration at 90 units. -Current medications: Lantus Solostar - Inject 90 units daily at bedtime  Metformin 500 mg - 1 tablet twice daily -Medications previously tried: glipizide, max dose metformin 1000 mg/day  -Current home glucose readings: 6/28 - 7/18 Fasting: 241, 176, 142, 184, 190, 181, 147, 216, 109, 169, 192, 187, 169, 221, 213 post prandial glucose: n/a -Denies hypoglycemic/hyperglycemic symptoms -Current meal patterns:   breakfast: fried eggs   dinner: wife cooks meat and vegetables  - he likes squash, green beans, peas, beans. Does not like leafy greens or other non-starchy vegetables. Extensive time spent discussed types of carbohydrates and impact on BG -Educated on A1c and blood sugar goals;  We discussed he will likely need meal time insulin or weekly injectable like Trulicity to reach his BG goals. He will discuss with PCP at next visit.  -Counseled to check feet daily and get yearly eye exams - due for foot exam, he reports he is due for [place] appt in 2 weeks -Recommended to continue current medication; Consider once weekly injectable like Trulicity, especially given his CAD. Pt to discuss with PCP.  Patient Goals/Self-Care Activities Patient will:  - check glucose daily, document, and provide at future appointments  Follow Up Plan: Telephone follow up appointment with care management team member scheduled for: 6 months; CMA call for BG log in 3 months    Medication Assistance: None required.  Patient affirms current coverage meets needs.  Compliance/Adherence/Medication fill history: Care Gaps: Reviewed with patient  Annual eye exam - will complete with PCP at August appt Annual foot exam - done 11/2020 per patient report Colonoscopy every 10 years  Tdap vaccine COVID-19 vaccine booster (4) Shingrix vaccine series   Star-Rating  Drugs: Medication:  Last Fill:         Day Supply Metformin 574m        12/28/20            90 Atorvastatin 453m      11/02/20            90 Lisinopril 4021m          01/05/21            90  No gaps in adherence  Patient's preferred pharmacy is:  [facility] [place]one: [phone]: [phone] pill box? Yes - Wife puts his medications in the pillbox - takes all medications at breakfast or bedtime  Pt endorses 100% compliance  We discussed: Current pharmacy is preferred with insurance plan and patient is satisfied with pharmacy services  Care Plan and Follow Up Patient Decision:  Patient agrees to Care Plan and Follow-up.  MicDebbora DusharmD Clinical Pharmacist [facility] Hillsimary Care at [phone]

## 2021-01-27 ENCOUNTER — Telehealth (INDEPENDENT_AMBULATORY_CARE_PROVIDER_SITE_OTHER): Payer: Self-pay

## 2021-01-27 NOTE — Telephone Encounter (Signed)
We will bring him in for carotid duplex to see Schnier myself.  The carotid duplex in January did not show any abnormalities.  Typically issues with your carotid will not cause swelling or pain but we can check a carotid artery duplex to see if we notice any abnormalities.  I would also strongly suggest that you reach out to your primary care physician as they may need to do some additional studies.

## 2021-01-28 NOTE — Telephone Encounter (Signed)
Lvm for patient to callback to be scheduled  

## 2021-01-28 NOTE — Telephone Encounter (Signed)
Patient  added 7/29 at 2pm

## 2021-01-31 ENCOUNTER — Other Ambulatory Visit: Payer: Self-pay | Admitting: Family Medicine

## 2021-01-31 DIAGNOSIS — E1151 Type 2 diabetes mellitus with diabetic peripheral angiopathy without gangrene: Secondary | ICD-10-CM

## 2021-02-02 DIAGNOSIS — H2513 Age-related nuclear cataract, bilateral: Secondary | ICD-10-CM | POA: Diagnosis not present

## 2021-02-02 DIAGNOSIS — H353213 Exudative age-related macular degeneration, right eye, with inactive scar: Secondary | ICD-10-CM | POA: Diagnosis not present

## 2021-02-02 DIAGNOSIS — E119 Type 2 diabetes mellitus without complications: Secondary | ICD-10-CM | POA: Diagnosis not present

## 2021-02-02 DIAGNOSIS — H353123 Nonexudative age-related macular degeneration, left eye, advanced atrophic without subfoveal involvement: Secondary | ICD-10-CM | POA: Diagnosis not present

## 2021-02-04 ENCOUNTER — Other Ambulatory Visit (INDEPENDENT_AMBULATORY_CARE_PROVIDER_SITE_OTHER): Payer: Self-pay | Admitting: Nurse Practitioner

## 2021-02-04 DIAGNOSIS — R221 Localized swelling, mass and lump, neck: Secondary | ICD-10-CM

## 2021-02-04 DIAGNOSIS — I6523 Occlusion and stenosis of bilateral carotid arteries: Secondary | ICD-10-CM

## 2021-02-04 DIAGNOSIS — Z9889 Other specified postprocedural states: Secondary | ICD-10-CM

## 2021-02-05 ENCOUNTER — Encounter (INDEPENDENT_AMBULATORY_CARE_PROVIDER_SITE_OTHER): Payer: Self-pay | Admitting: Nurse Practitioner

## 2021-02-05 ENCOUNTER — Other Ambulatory Visit: Payer: Self-pay

## 2021-02-05 ENCOUNTER — Ambulatory Visit (INDEPENDENT_AMBULATORY_CARE_PROVIDER_SITE_OTHER): Payer: Medicare Other

## 2021-02-05 ENCOUNTER — Ambulatory Visit (INDEPENDENT_AMBULATORY_CARE_PROVIDER_SITE_OTHER): Payer: Medicare Other | Admitting: Nurse Practitioner

## 2021-02-05 VITALS — BP 128/79 | HR 64 | Ht 68.0 in | Wt 198.0 lb

## 2021-02-05 DIAGNOSIS — E782 Mixed hyperlipidemia: Secondary | ICD-10-CM

## 2021-02-05 DIAGNOSIS — I739 Peripheral vascular disease, unspecified: Secondary | ICD-10-CM

## 2021-02-05 DIAGNOSIS — Z9889 Other specified postprocedural states: Secondary | ICD-10-CM

## 2021-02-05 DIAGNOSIS — I6523 Occlusion and stenosis of bilateral carotid arteries: Secondary | ICD-10-CM

## 2021-02-05 DIAGNOSIS — I10 Essential (primary) hypertension: Secondary | ICD-10-CM

## 2021-02-05 DIAGNOSIS — R221 Localized swelling, mass and lump, neck: Secondary | ICD-10-CM

## 2021-02-05 NOTE — Progress Notes (Signed)
Subjective:    Patient ID: [name], male    DOB: 1945/12/12, 75 y.o.   MRN: [id number] Chief Complaint  Patient presents with   Follow-up    Add on per telephone note     [name] is a 75 year old male that presents today for evaluation due to pain and swelling on his left neck.  The patient had a left carotid endarterectomy on 09/21/2018.  The patient notes that he has had symptoms in his left hand and The ring.  Sometimes it hurts when he swallows although there is no certain food that makes his symptoms worse.  He also has some tenderness when turning his neck.  In addition to these issues the patient is in his left vein of very tender for the last 2 to 3 weeks.  On the lateral portion pressure is applied it is very painful but also something as simple as a sheet touching his toe is painful for him.  He denies any open wounds or ulcerations.  He denies any drainage from the area.  He denies any redness or swelling.  He denies any claudication-like symptoms or rest pain.  The patient has had a previous bypass in his left lower extremity.  Overall the patient denies any fevers or chills, no seizures.  Today noninvasive studies show a 1 to 39% stenosis in the right ICA with a 40 to 59% stenosis in the left ICA.  Notes that there is postoperative narrowing seen in the mid to distal ICA.  The jugular vein is patent with no evidence of DVT.  The bilateral vertebral arteries demonstrate antegrade flow with normal flow hemodynamics in the bilateral subclavian arteries.   Review of Systems  Musculoskeletal:  Positive for neck pain and neck stiffness.  All other systems reviewed and are negative.     Objective:   Physical Exam Vitals reviewed.  HENT:     Head: Normocephalic.  Neck:     Vascular: No carotid bruit.  Cardiovascular:     Rate and Rhythm: Normal rate and regular rhythm.     Pulses:          Dorsalis pedis pulses are 1+ on the left side.       Posterior tibial pulses  are 1+ on the left side.  Pulmonary:     Effort: Pulmonary effort is normal.  Musculoskeletal:     Cervical back: Tenderness present.  Neurological:     Mental Status: He is alert and oriented to person, place, and time. Mental status is at baseline.  Psychiatric:        Mood and Affect: Mood normal.        Behavior: Behavior normal.        Thought Content: Thought content normal.        Judgment: Judgment normal.    BP 128/79   Pulse 64   Ht 5' 8"  (1.727 m)   Wt 198 lb (89.8 kg)   BMI 30.11 kg/m   Past Medical History:  Diagnosis Date   CAD (coronary artery disease)    a. 1999: PCI-->LAD 2/2 MI; b. 2005: inf MI s/p PCI/DES x 4 to RCA; c. Myoview in 12/2005: EF 61%, no evidence for ischemia; d. cath 10/2014: occluded mLAD w/ L to L and L to R collats, dRCA 95% s/p PCI/DES 0%, mPDA 70%, LCx mild to mod irregs, procedure complicated by inf ST ele in recovery, repeat cath showed acute dRCA stent thrombosis o/w occluded mRPDA, PTCA dRCA,  PCI/DES RPDA, aggrastat x 18 hr   CKD (chronic kidney disease)    COPD (chronic obstructive pulmonary disease) (HCC)    DM2 (diabetes mellitus, type 2) ([place])    H/O hiatal hernia    Heart attack ([place]) [phone]   had 2 in one day(released plaque during angio). 5 stents total   HTN (hypertension)    Hypercholesterolemia    Impaired fasting glucose    Elevated after steroid injection   Macular degeneration of right eye 2020   receiving injections into eye   Osteoarthritis of hip    injections in both hips   Osteoarthritis, knee    PAD (peripheral artery disease) (HCC)    L fem to below the knee popliteal vein bypass graft   Peri-rectal abscess 12/27/2011   Tobacco abuse    Prior    Social History   Socioeconomic History   Marital status: Married    Spouse name: [name]   Number of children: 2   Years of education: Not on file   Highest education level: Not on file  Occupational History   Occupation: Press photographer for a [employer]    Comment: still works full time  Tobacco Use   Smoking status: Former    Packs/day: 1.00    Years: 40.00    Pack years: 40.00    Types: Cigarettes    Quit date: 10/21/1998    Years since quitting: 22.3   Smokeless tobacco: Never   Tobacco comments:    Past smoker, states he will smoke maybe 1 cigarette/ month or so still  Vaping Use   Vaping Use: Never used  Substance and Sexual Activity   Alcohol use: Not Currently    Alcohol/week: 0.0 standard drinks    Comment: Occasional beer on the weekends   Drug use: No   Sexual activity: Never  Other Topics Concern   Not on file  Social History Narrative   Lives in [place]   Married 50+ years   2 grown daughters, 7 grandchildren, 4 great grandchildren   Designated Party Release Form signed on 01/19/10 appointing Gerhard Munch.    Social Determinants of Health   Financial Resource Strain: Low Risk    Difficulty of Paying Living Expenses: Not very hard  Food Insecurity: Not on file  Transportation Needs: Not on file  Physical Activity: Not on file  Stress: Not on file  Social Connections: Not on file  Intimate Partner Violence: Not on file    Past Surgical History:  Procedure Laterality Date   [place], 2006, 2016 x 2   Multiple, LAD in 1999, RCA in 2005.  has a total of 4 stents   ENDARTERECTOMY Left 09/21/2018   Procedure: ENDARTERECTOMY CAROTID;  Surgeon: Katha Cabal, MD;  Location: [facility];  Service: Vascular;  Laterality: Left;   FEMORAL-POPLITEAL BYPASS GRAFT  2016   L fem to below the knee popliteal vein bypass graft   INCISE AND DRAIN ABCESS  2007   on scrotum   INCISION AND DRAINAGE PERIRECTAL ABSCESS  12/28/2011   Procedure: [place];  Surgeon: Merrie Roof, MD;  Location: [facility];  Service: General;  Laterality: N/A;    Family History  Problem Relation Age of Onset   Heart failure Father        CHF   CAD  Mother    Diabetes Mother    Colon cancer Neg Hx  Prostate cancer Neg Hx     Allergies  Allergen Reactions   Augmentin [Amoxicillin-Pot Clavulanate] Other (See Comments)    Nausea,vomiting,diarrhea   Morphine And Related Nausea And Vomiting    Vomiting, GI upset   Metformin And Related Other (See Comments)    Intolerant of 2075m a day.     CBC Latest Ref Rng & Units 01/17/2020 11/22/2019 11/21/2019  WBC 4.0 - 10.5 K/uL 8.0 15.8(H) 13.3(H)  Hemoglobin 13.0 - 17.0 g/dL 14.2 14.5 14.6  Hematocrit 39.0 - 52.0 % 43.1 42.2 43.1  Platelets 150.0 - 400.0 K/uL 188.0 219 219      CMP     Component Value Date/Time   NA 141 01/17/2020 0817   NA 134 (L) 10/29/2014 0908   K 4.1 01/17/2020 0817   K 4.3 10/29/2014 0908   CL 105 01/17/2020 0817   CL 103 10/29/2014 0908   CO2 27 01/17/2020 0817   CO2 24 10/29/2014 0908   GLUCOSE 145 (H) 01/17/2020 0817   GLUCOSE 142 (H) 10/29/2014 0908   BUN 16 01/17/2020 0817   BUN 20 10/29/2014 0908   CREATININE 1.24 01/17/2020 0817   CREATININE 1.32 (H) 10/29/2014 0908   CREATININE 0.96 01/28/2011 0855   CALCIUM 9.4 01/17/2020 0817   CALCIUM 9.3 10/29/2014 0908   PROT 6.1 01/17/2020 0817   PROT 6.1 01/19/2012 0000   ALBUMIN 4.0 01/17/2020 0817   ALBUMIN 3.9 01/19/2012 0000   AST 10 01/17/2020 0817   ALT 11 01/17/2020 0817   ALKPHOS 34 (L) 01/17/2020 0817   BILITOT 0.6 01/17/2020 0817   GFRNONAA 49 (L) 11/22/2019 0952   GFRNONAA 55 (L) 10/29/2014 0908   GFRAA 56 (L) 11/22/2019 0952   GFRAA >60 10/29/2014 0908     No results found.     Assessment & Plan:   1. History of carotid endarterectomy Noninvasive studies do not show any evidence of obvious DVT in the jugular vein.  There is some postoperative Swelling and velocities are 40 to 59% in the left ICA.  The patient has tenderness only in the area and pain with swallowing so to further evaluate we will obtain a CT angiogram.  2. [place] The patient  has an area of soreness on his left big toe.  There is no redness or wound seen.  However the patient does have a previous history of revascularization of his left lower extremity.  The patient returns for review of his CT scan we will also obtain ABIs to ensure issues with his leg circulation causing the pain is generally not exacerbation that with his diabetes this may be neuropathy as well.  3. Essential hypertension Continue antihypertensive medications as already ordered, these medications have been reviewed and there are no changes at this time.   4. Mixed hyperlipidemia Continue statin as ordered and reviewed, no changes at this time    Current Outpatient Medications on File Prior to Visit  Medication Sig Dispense Refill   acetaminophen (TYLENOL) 325 MG tablet Take 1-2 tablets (325-650 mg total) by mouth every 4 (four) hours as needed for mild pain (or temp >/= 101 F). 100 tablet 0   albuterol (PROAIR HFA) 108 (90 BASE) MCG/ACT inhaler Inhale 2 puffs into the lungs 3 (three) times daily as needed (cough). 18 g 2   amLODipine (NORVASC) 5 MG tablet Take 1 tablet (5 mg total) by mouth daily. 90 tablet 3   aspirin 81 MG EC tablet Take 1 tablet (81 mg total)  by mouth daily. 30 tablet 0   atorvastatin (LIPITOR) 40 MG tablet TAKE 1 TABLET(40 MG) BY MOUTH DAILY 90 tablet 3   blood glucose meter kit and supplies KIT 1 each by Does not apply route daily as needed. Dispense based on patient and insurance preference. Use up to four times daily as directed. (FOR ICD-9 250.00, 250.01). 1 each 0   BRILINTA 60 MG TABS tablet TAKE 1 TABLET BY MOUTH TWICE DAILY 180 tablet 1   fenofibrate (TRICOR) 145 MG tablet TAKE 1 TABLET(145 MG) BY MOUTH DAILY 30 tablet 11   Fluticasone-Salmeterol (ADVAIR DISKUS) 250-50 MCG/DOSE AEPB Inhale 1 puff into the lungs 2 (two) times daily. 3 each 3   glucosamine-chondroitin 500-400 MG tablet Take 1 tablet by mouth 2 (two) times daily.     glucose blood (ONETOUCH ULTRA) test  strip CHECK SUGAR TWICE DAILY 100 strip 4   insulin glargine (LANTUS SOLOSTAR) 100 UNIT/ML Solostar Pen ADMINISTER 90-100 UNITS UNDER THE SKIN DAILY 90 mL 4   Insulin Pen Needle (B-D UF III MINI PEN NEEDLES) 31G X 5 MM MISC Use as instructed to inject insulin.  Diagnosis:  250.00  Insulin dependent. 100 each 3   lisinopril (ZESTRIL) 40 MG tablet TAKE 1 TABLET BY MOUTH EVERY DAY 90 tablet 2   meclizine (ANTIVERT) 25 MG tablet Take 25 mg by mouth every 6 (six) hours as needed. For dizziness/vertigo     metFORMIN (GLUCOPHAGE) 500 MG tablet TAKE 2 TABLETS BY MOUTH TWICE DAILY WITH A MEAL 360 tablet 1   metoprolol tartrate (LOPRESSOR) 25 MG tablet TAKE 1/2 TABLET BY MOUTH TWICE DAILY 90 tablet 1   Multiple Vitamins-Minerals (PRESERVISION AREDS 2 PO) Take 1 tablet by mouth 2 (two) times daily.     nitroGLYCERIN (NITROSTAT) 0.4 MG SL tablet Place 1 tablet (0.4 mg total) under the tongue every 5 (five) minutes as needed. For chest pain 25 tablet 12   No current facility-administered medications on file prior to visit.    There are no Patient Instructions on file for this visit. No follow-ups on file.   Kris Hartmann, NP

## 2021-02-07 NOTE — Patient Instructions (Signed)
Dear [name],  Below is a summary of the goals we discussed during our follow up appointment on February 07, 2021. Please contact me anytime with questions or concerns.   Visit Information   Goals Addressed   None    Patient Care Plan: CCM Pharmacy Care Plan     Problem Identified: Disease Management   Priority: High  Onset Date: 01/25/2021  Note:   Current Barriers:  Unable to achieve control of diabetes   Pharmacist Clinical Goal(s):  Patient will contact provider office for questions/concerns as evidenced notation of same in electronic health record through collaboration with PharmD and provider.   Interventions: 1:1 collaboration with Joaquim Nam, MD regarding development and update of comprehensive plan of care as evidenced by provider attestation and co-signature Inter-disciplinary care team collaboration (see longitudinal plan of care) Comprehensive medication review performed; medication list updated in electronic medical record  Hypertension (BP goal <140/90) -Controlled - clinic BP within goal -Current treatment: Amlodipine 5 mg - 1 tablet daily at bedtime  HCTZ 25 mg - 1 tablet daily Metoprolol tartrate 25 mg - 1/2 tablet twice daily -Medications previously tried: none  -Current home readings: none reported -Denies hypotensive/hypertensive symptoms -Recommended to continue current medication  Diabetes (A1c goal <7%) -Not ideally controlled - Sugar goes up and down and he is not sure what causes it. He does have occasional ice cream or potatoes. He states he could go all day and not eat, BG still in 200s. He is active around the house, mows yard, outside active daily. He has not been able to get BG < 150 more than 50% of the time despite increasing Lantus. Stopped his titration at 90 units. -Current medications: Lantus Solostar - Inject 90 units daily at bedtime  Metformin 500 mg - 1 tablet twice daily -Medications previously tried: glipizide, max dose  metformin 1000 mg/day  -Current home glucose readings: 6/28 - 7/18 Fasting: 241, 176, 142, 184, 190, 181, 147, 216, 109, 169, 192, 187, 169, 221, 213 post prandial glucose: n/a -Denies hypoglycemic/hyperglycemic symptoms -Current meal patterns:   breakfast: fried eggs   dinner: wife cooks meat and vegetables  - he likes squash, green beans, peas, beans. Does not like leafy greens or other non-starchy vegetables. Extensive time spent discussed types of carbohydrates and impact on BG -Educated on A1c and blood sugar goals;  We discussed he will likely need meal time insulin or weekly injectable like Trulicity to reach his BG goals. He will discuss with PCP at next visit.  -Counseled to check feet daily and get yearly eye exams - due for foot exam, he reports he is due for [facility] appt in 2 weeks -Recommended to continue current medication; Consider once weekly injectable like Trulicity, especially given his CAD. Pt to discuss with PCP.  Patient Goals/Self-Care Activities Patient will:  - check glucose daily, document, and provide at future appointments  Follow Up Plan: Telephone follow up appointment with care management team member scheduled for: 6 months; CMA call for BG log in 3 months      Patient verbalizes understanding of instructions provided today and agrees to view in MyChart.   Phil Dopp, PharmD Clinical Pharmacist [facility] [phone]'

## 2021-02-09 ENCOUNTER — Ambulatory Visit (INDEPENDENT_AMBULATORY_CARE_PROVIDER_SITE_OTHER): Payer: Medicare Other

## 2021-02-09 DIAGNOSIS — Z0000 Encounter for general adult medical examination without abnormal findings: Secondary | ICD-10-CM | POA: Diagnosis not present

## 2021-02-09 NOTE — Patient Instructions (Signed)
[name] , Thank you for taking time to come for your Medicare Wellness Visit. I appreciate your ongoing commitment to your health goals. Please review the following plan we discussed and let me know if I can assist you in the future.   Screening recommendations/referrals: Colonoscopy: due, will discuss with your provider  Recommended yearly ophthalmology/optometry visit for glaucoma screening and checkup Recommended yearly dental visit for hygiene and checkup  Vaccinations: Influenza vaccine: Up to date, completed 05/14/2020, due Fall 2022 Pneumococcal vaccine: Completed series Tdap vaccine: decline-insurance Shingles vaccine: due, check with your insurance regarding coverage if interested    Covid-19: completed 3 vaccines   Advanced directives: Advance directive discussed with you today. Even though you declined this today please call our office should you change your mind and we can give you the proper paperwork for you to fill out.  Conditions/risks identified: diabetes, hypertension  Next appointment: Follow up in one year for your annual wellness visit.   Preventive Care 76 Years and Older, Male Preventive care refers to lifestyle choices and visits with your health care provider that can promote health and wellness. What does preventive care include? A yearly physical exam. This is also called an annual well check. Dental exams once or twice a year. Routine eye exams. Ask your health care provider how often you should have your eyes checked. Personal lifestyle choices, including: Daily care of your teeth and gums. Regular physical activity. Eating a healthy diet. Avoiding tobacco and drug use. Limiting alcohol use. Practicing safe sex. Taking low doses of aspirin every day. Taking vitamin and mineral supplements as recommended by your health care provider. What happens during an annual well check? The services and screenings done by your health care provider during your  annual well check will depend on your age, overall health, lifestyle risk factors, and family history of disease. Counseling  Your health care provider may ask you questions about your: Alcohol use. Tobacco use. Drug use. Emotional well-being. Home and relationship well-being. Sexual activity. Eating habits. History of falls. Memory and ability to understand (cognition). Work and work Astronomer. Screening  You may have the following tests or measurements: Height, weight, and BMI. Blood pressure. Lipid and cholesterol levels. These may be checked every 5 years, or more frequently if you are over 76 years old. Skin check. Lung cancer screening. You may have this screening every year starting at age 76 if you have a 30-pack-year history of smoking and currently smoke or have quit within the past 15 years. Fecal occult blood test (FOBT) of the stool. You may have this test every year starting at age 76. Flexible sigmoidoscopy or colonoscopy. You may have a sigmoidoscopy every 5 years or a colonoscopy every 10 years starting at age 27. Prostate cancer screening. Recommendations will vary depending on your family history and other risks. Hepatitis C blood test. Hepatitis B blood test. Sexually transmitted disease (STD) testing. Diabetes screening. This is done by checking your blood sugar (glucose) after you have not eaten for a while (fasting). You may have this done every 1-3 years. Abdominal aortic aneurysm (AAA) screening. You may need this if you are a current or former smoker. Osteoporosis. You may be screened starting at age 76 if you are at high risk. Talk with your health care provider about your test results, treatment options, and if necessary, the need for more tests. Vaccines  Your health care provider may recommend certain vaccines, such as: Influenza vaccine. This is recommended every year. Tetanus,  diphtheria, and acellular pertussis (Tdap, Td) vaccine. You may need a Td  booster every 10 years. Zoster vaccine. You may need this after age 76. Pneumococcal 13-valent conjugate (PCV13) vaccine. One dose is recommended after age 76. Pneumococcal polysaccharide (PPSV23) vaccine. One dose is recommended after age 76. Talk to your health care provider about which screenings and vaccines you need and how often you need them. This information is not intended to replace advice given to you by your health care provider. Make sure you discuss any questions you have with your health care provider. Document Released: 07/24/2015 Document Revised: 03/16/2016 Document Reviewed: 04/28/2015 Elsevier Interactive Patient Education  2017 [employer] Prevention in the Home Falls can cause injuries. They can happen to people of all ages. There are many things you can do to make your home safe and to help prevent falls. What can I do on the outside of my home? Regularly fix the edges of walkways and driveways and fix any cracks. Remove anything that might make you trip as you walk through a door, such as a raised step or threshold. Trim any bushes or trees on the path to your home. Use bright outdoor lighting. Clear any walking paths of anything that might make someone trip, such as rocks or tools. Regularly check to see if handrails are loose or broken. Make sure that both sides of any steps have handrails. Any raised decks and porches should have guardrails on the edges. Have any leaves, snow, or ice cleared regularly. Use sand or salt on walking paths during winter. Clean up any spills in your garage right away. This includes oil or grease spills. What can I do in the bathroom? Use night lights. Install grab bars by the toilet and in the tub and shower. Do not use towel bars as grab bars. Use non-skid mats or decals in the tub or shower. If you need to sit down in the shower, use a plastic, non-slip stool. Keep the floor dry. Clean up any water that spills on the floor  as soon as it happens. Remove soap buildup in the tub or shower regularly. Attach bath mats securely with double-sided non-slip rug tape. Do not have throw rugs and other things on the floor that can make you trip. What can I do in the bedroom? Use night lights. Make sure that you have a light by your bed that is easy to reach. Do not use any sheets or blankets that are too big for your bed. They should not hang down onto the floor. Have a firm chair that has side arms. You can use this for support while you get dressed. Do not have throw rugs and other things on the floor that can make you trip. What can I do in the kitchen? Clean up any spills right away. Avoid walking on wet floors. Keep items that you use a lot in easy-to-reach places. If you need to reach something above you, use a strong step stool that has a grab bar. Keep electrical cords out of the way. Do not use floor polish or wax that makes floors slippery. If you must use wax, use non-skid floor wax. Do not have throw rugs and other things on the floor that can make you trip. What can I do with my stairs? Do not leave any items on the stairs. Make sure that there are handrails on both sides of the stairs and use them. Fix handrails that are broken or loose. Make  sure that handrails are as long as the stairways. Check any carpeting to make sure that it is firmly attached to the stairs. Fix any carpet that is loose or worn. Avoid having throw rugs at the top or bottom of the stairs. If you do have throw rugs, attach them to the floor with carpet tape. Make sure that you have a light switch at the top of the stairs and the bottom of the stairs. If you do not have them, ask someone to add them for you. What else can I do to help prevent falls? Wear shoes that: Do not have high heels. Have rubber bottoms. Are comfortable and fit you well. Are closed at the toe. Do not wear sandals. If you use a stepladder: Make sure that it is  fully opened. Do not climb a closed stepladder. Make sure that both sides of the stepladder are locked into place. Ask someone to hold it for you, if possible. Clearly mark and make sure that you can see: Any grab bars or handrails. First and last steps. Where the edge of each step is. Use tools that help you move around (mobility aids) if they are needed. These include: Canes. Walkers. Scooters. Crutches. Turn on the lights when you go into a dark area. Replace any light bulbs as soon as they burn out. Set up your furniture so you have a clear path. Avoid moving your furniture around. If any of your floors are uneven, fix them. If there are any pets around you, be aware of where they are. Review your medicines with your doctor. Some medicines can make you feel dizzy. This can increase your chance of falling. Ask your doctor what other things that you can do to help prevent falls. This information is not intended to replace advice given to you by your health care provider. Make sure you discuss any questions you have with your health care provider. Document Released: 04/23/2009 Document Revised: 12/03/2015 Document Reviewed: 08/01/2014 Elsevier Interactive Patient Education  2017 [employer].

## 2021-02-09 NOTE — Progress Notes (Signed)
Subjective:   [name] is a 75 y.o. male who presents for Medicare Annual/Subsequent preventive examination.  Review of Systems: N/A      I connected with the patient today by telephone and verified that I am speaking with the correct person using two identifiers. Location patient: home Location nurse: work Persons participating in the telephone visit: patient, nurse.   I discussed the limitations, risks, security and privacy concerns of performing an evaluation and management service by telephone and the availability of in person appointments. I also discussed with the patient that there may be a patient responsible charge related to this service. The patient expressed understanding and verbally consented to this telephonic visit.        Cardiac Risk Factors include: advanced age (>84mn, >>57women);diabetes mellitus;hypertension     Objective:    Today's Vitals   There is no height or weight on file to calculate BMI.  Advanced Directives 02/09/2021 11/22/2019 11/21/2019 09/14/2018 01/06/2018 04/27/2017 03/31/2017  Does Patient Have a Medical Advance Directive? _0  No No  Type of Advance Directive - - - - - - -  Does patient want to make changes to medical advance directive? No - Patient declined - - - - - -  Would patient like information on creating a medical advance directive? - - - No - Patient declined No - Patient declined Yes (MAU/Ambulatory/Procedural Areas - Information given) Yes (MAU/Ambulatory/Procedural Areas - Information given)  Pre-existing out of facility DNR order (yellow form or pink MOST form) - - - - - - -    Current Medications (verified) Outpatient Encounter Medications as of 02/09/2021  Medication Sig   acetaminophen (TYLENOL) 325 MG tablet Take 1-2 tablets (325-650 mg total) by mouth every 4 (four) hours as needed for mild pain (or temp >/= 101 F).   albuterol (PROAIR HFA) 108 (90 BASE) MCG/ACT inhaler Inhale 2 puffs into the lungs 3 (three)  times daily as needed (cough).   amLODipine (NORVASC) 5 MG tablet Take 1 tablet (5 mg total) by mouth daily.   aspirin 81 MG EC tablet Take 1 tablet (81 mg total) by mouth daily.   atorvastatin (LIPITOR) 40 MG tablet TAKE 1 TABLET(40 MG) BY MOUTH DAILY   blood glucose meter kit and supplies KIT 1 each by Does not apply route daily as needed. Dispense based on patient and insurance preference. Use up to four times daily as directed. (FOR ICD-9 250.00, 250.01).   BRILINTA 60 MG TABS tablet TAKE 1 TABLET BY MOUTH TWICE DAILY   fenofibrate (TRICOR) 145 MG tablet TAKE 1 TABLET(145 MG) BY MOUTH DAILY   Fluticasone-Salmeterol (ADVAIR DISKUS) 250-50 MCG/DOSE AEPB Inhale 1 puff into the lungs 2 (two) times daily.   glucosamine-chondroitin 500-400 MG tablet Take 1 tablet by mouth 2 (two) times daily.   glucose blood (ONETOUCH ULTRA) test strip CHECK SUGAR TWICE DAILY   insulin glargine (LANTUS SOLOSTAR) 100 UNIT/ML Solostar Pen ADMINISTER 90-100 UNITS UNDER THE SKIN DAILY   Insulin Pen Needle (B-D UF III MINI PEN NEEDLES) 31G X 5 MM MISC Use as instructed to inject insulin.  Diagnosis:  250.00  Insulin dependent.   lisinopril (ZESTRIL) 40 MG tablet TAKE 1 TABLET BY MOUTH EVERY DAY   meclizine (ANTIVERT) 25 MG tablet Take 25 mg by mouth every 6 (six) hours as needed. For dizziness/vertigo   metFORMIN (GLUCOPHAGE) 500 MG tablet TAKE 2 TABLETS BY MOUTH TWICE DAILY WITH A MEAL   metoprolol tartrate (LOPRESSOR) 25  MG tablet TAKE 1/2 TABLET BY MOUTH TWICE DAILY   Multiple Vitamins-Minerals (PRESERVISION AREDS 2 PO) Take 1 tablet by mouth 2 (two) times daily.   nitroGLYCERIN (NITROSTAT) 0.4 MG SL tablet Place 1 tablet (0.4 mg total) under the tongue every 5 (five) minutes as needed. For chest pain   No facility-administered encounter medications on file as of 02/09/2021.    Allergies (verified) Augmentin [amoxicillin-pot clavulanate], Morphine and related, and Metformin and related   History: Past Medical  History:  Diagnosis Date   CAD (coronary artery disease)    a. 1999: PCI-->LAD 2/2 MI; b. 2005: inf MI s/p PCI/DES x 4 to RCA; c. Myoview in 12/2005: EF 61%, no evidence for ischemia; d. cath 10/2014: occluded mLAD w/ L to L and L to R collats, dRCA 95% s/p PCI/DES 0%, mPDA 70%, LCx mild to mod irregs, procedure complicated by inf ST ele in recovery, repeat cath showed acute dRCA stent thrombosis o/w occluded mRPDA, PTCA dRCA, PCI/DES RPDA, aggrastat x 18 hr   CKD (chronic kidney disease)    COPD (chronic obstructive pulmonary disease) (HCC)    DM2 (diabetes mellitus, type 2) ([place])    H/O hiatal hernia    Heart attack ([place]) [phone]   had 2 in one day(released plaque during angio). 5 stents total   HTN (hypertension)    Hypercholesterolemia    Impaired fasting glucose    Elevated after steroid injection   Macular degeneration of right eye 2020   receiving injections into eye   Osteoarthritis of hip    injections in both hips   Osteoarthritis, knee    PAD (peripheral artery disease) (HCC)    L fem to below the knee popliteal vein bypass graft   Peri-rectal abscess 12/27/2011   Tobacco abuse    Prior   Past Surgical History:  Procedure Laterality Date   [place], 2006, 2016 x 2   Multiple, LAD in 1999, RCA in 2005.  has a total of 4 stents   ENDARTERECTOMY Left 09/21/2018   Procedure: ENDARTERECTOMY CAROTID;  Surgeon: Katha Cabal, MD;  Location: [facility];  Service: Vascular;  Laterality: Left;   FEMORAL-POPLITEAL BYPASS GRAFT  2016   L fem to below the knee popliteal vein bypass graft   INCISE AND DRAIN ABCESS  2007   on scrotum   INCISION AND DRAINAGE PERIRECTAL ABSCESS  12/28/2011   Procedure: [place];  Surgeon: Merrie Roof, MD;  Location: [facility];  Service: General;  Laterality: N/A;   Family History  Problem Relation Age of Onset   Heart failure Father        CHF   CAD Mother     Diabetes Mother    Colon cancer Neg Hx    Prostate cancer Neg Hx    Social History   Socioeconomic History   Marital status: Married    Spouse name: wanda   Number of children: 2   Years of education: Not on file   Highest education level: Not on file  Occupational History   Occupation: Press photographer for a [employer]    Comment: still works full time  Tobacco Use   Smoking status: Former    Packs/day: 1.00    Years: 40.00    Pack years: 40.00    Types: Cigarettes    Quit date: 10/21/1998    Years since quitting: 22.3   Smokeless tobacco: Never  Tobacco comments:    Past smoker, states he will smoke maybe 1 cigarette/ month or so still  Vaping Use   Vaping Use: Never used  Substance and Sexual Activity   Alcohol use: Not Currently    Alcohol/week: 0.0 standard drinks    Comment: Occasional beer on the weekends   Drug use: No   Sexual activity: Never  Other Topics Concern   Not on file  Social History Narrative   Lives in [place]   Married 50+ years   2 grown daughters, 7 grandchildren, 4 great grandchildren   Designated Party Release Form signed on 01/19/10 appointing Gerhard Munch.    Social Determinants of Health   Financial Resource Strain: Low Risk    Difficulty of Paying Living Expenses: Not hard at all  Food Insecurity: No Food Insecurity   Worried About Charity fundraiser in the Last Year: Never true   [place] in the Last Year: Never true  Transportation Needs: No Transportation Needs   Lack of Transportation (Medical): No   Lack of Transportation (Non-Medical): No  Physical Activity: Inactive   Days of Exercise per Week: 0 days   Minutes of Exercise per Session: 0 min  Stress: No Stress Concern Present   Feeling of Stress : Not at all  Social Connections: Not on file    Tobacco Counseling Counseling given: Not Answered Tobacco comments: Past smoker, states he will smoke maybe 1 cigarette/ month or so still   Clinical  Intake:  Pre-visit preparation completed: Yes  Pain : No/denies pain     Diabetes: Yes CBG done?: No Did pt. bring in CBG monitor from home?: No  How often do you need to have someone help you when you read instructions, pamphlets, or other written materials from your doctor or pharmacy?: 1 - Never  Diabetic: Yes Nutrition Risk Assessment:  Has the patient had any N/V/D within the last 2 months?  No  Does the patient have any non-healing wounds?  No  Has the patient had any unintentional weight loss or weight gain?  No   Diabetes:  Is the patient diabetic?  Yes  If diabetic, was a CBG obtained today?  No  telephone visit  Did the patient bring in their glucometer from home?  No  telephone visit  How often do you monitor your CBG's? daily.   Financial Strains and Diabetes Management:  Are you having any financial strains with the device, your supplies or your medication? No .  Does the patient want to be seen by Chronic Care Management for management of their diabetes?  No  Would the patient like to be referred to a Nutritionist or for Diabetic Management?  No   Diabetic Exams:  Diabetic Eye Exam: Completed 08/11/2020 Diabetic Foot Exam: Overdue, Pt has been advised about the importance in completing this exam. Pt is scheduled for diabetic foot exam on 02/16/2021.   Interpreter Needed?: No  Information entered by :: CJohnson, RN   Activities of Daily Living In your present state of health, do you have any difficulty performing the following activities: 02/09/2021  Hearing? Y  Comment wears hearing aids  Vision? N  Difficulty concentrating or making decisions? N  Walking or climbing stairs? N  Dressing or bathing? N  Doing errands, shopping? N  Preparing Food and eating ? N  Using the Toilet? N  In the past six months, have you accidently leaked urine? N  Do you have problems with loss of  bowel control? N  Managing your Medications? N  Managing your Finances? N   Housekeeping or managing your Housekeeping? N  Some recent data might be hidden    Patient Care Team: Tonia Ghent, MD as PCP - General Rockey Situ Kathlene November, MD as PCP - Cardiology (Cardiology) Debbora Dus, [facility] (Pharmacist)  Indicate any recent Medical Services you may have received from other than [facility] providers in the past year (date may be approximate).     Assessment:   This is a routine wellness examination for Gershon.  Hearing/Vision screen Vision Screening - Comments:: Patient gets annual eye exams   Dietary issues and exercise activities discussed: Current Exercise Habits: The patient does not participate in regular exercise at present, Exercise limited by: None identified   Goals Addressed             This Visit's Progress    Patient Stated       02/09/2021, I will maintain and continue medications as prescribed.       Depression Screen PHQ 2/9 Scores 02/09/2021 05/14/2020 03/31/2017 12/29/2016 11/20/2015  PHQ - 2 Score 0 0 0 0 0  PHQ- 9 Score 0 - 0 - -    Fall Risk Fall Risk  02/09/2021 05/14/2020 12/11/2019 11/27/2019 07/30/2018  Falls in the past year? 0 0 0 0 0  Comment - - - - -  Number falls in past yr: 0 0 0 0 0  Injury with Fall? 0 - 0 0 0  Risk for fall due to : Medication side effect - - - -  Follow up Falls evaluation completed;Falls prevention discussed Falls evaluation completed - - -    FALL RISK PREVENTION PERTAINING TO THE HOME:  Any stairs in or around the home? Yes  If so, are there any without handrails? No  Home free of loose throw rugs in walkways, pet beds, electrical cords, etc? Yes  Adequate lighting in your home to reduce risk of falls? Yes   ASSISTIVE DEVICES UTILIZED TO PREVENT FALLS:  Life alert? No  Use of a cane, walker or w/c? No  Grab bars in the bathroom? No  Shower chair or bench in shower? No  Elevated toilet seat or a handicapped toilet? No   TIMED UP AND GO:  Was the test performed?  N/A telephone visit .     Cognitive Function: MMSE - Mini Mental State Exam 02/09/2021 03/31/2017 11/20/2015  Not completed: Refused - -  Orientation to time - 5 5  Orientation to Place - 5 5  Registration - 3 3  Attention/ Calculation - 0 0  Recall - 2 3  Recall-comments - pt was unable to recall 1 of 3 words -  Language- name 2 objects - 0 0  Language- repeat - 1 1  Language- follow 3 step command - 3 3  Language- read & follow direction - 0 0  Write a sentence - 0 0  Copy design - 0 0  Total score - 19 20  Mini Cog  Mini-Cog screen was not completed. Patient refused. Maximum score is 22. A value of 0 denotes this part of the MMSE was not completed or the patient failed this part of the Mini-Cog screening.       Immunizations Immunization History  Administered Date(s) Administered   Fluad Quad(high Dose 65+) 05/14/2020   Influenza,inj,Quad PF,6+ Mos 07/16/2013, 04/21/2014, 06/02/2015, 06/10/2016, 03/31/2017, 08/02/2018   PFIZER(Purple Top)SARS-COV-2 Vaccination 08/17/2019, 09/07/2019, 04/25/2020   Pneumococcal Conjugate-13 10/20/2014   Pneumococcal  Polysaccharide-23 12/29/2011   Td 01/19/2010    TDAP status: Due, Education has been provided regarding the importance of this vaccine. Advised may receive this vaccine at local pharmacy or Health Dept. Aware to provide a copy of the vaccination record if obtained from local pharmacy or Health Dept. Verbalized acceptance and understanding.  Flu Vaccine status: Up to date  Pneumococcal vaccine status: Up to date  Covid-19 vaccine status: Completed 3 vaccines  Qualifies for Shingles Vaccine? Yes   Zostavax completed No   Shingrix Completed?: No.    Education has been provided regarding the importance of this vaccine. Patient has been advised to call insurance company to determine out of pocket expense if they have not yet received this vaccine. Advised may also receive vaccine at local pharmacy or Health Dept. Verbalized acceptance and  understanding.  Screening Tests Health Maintenance  Topic Date Due   Zoster Vaccines- Shingrix (1 of 2) Never done   COVID-19 Vaccine (4 - Booster for Pfizer series) 08/26/2020   COLONOSCOPY (Pts 45-94yr Insurance coverage will need to be confirmed)  12/16/2020   FOOT EXAM  01/23/2021   INFLUENZA VACCINE  02/08/2021   TETANUS/TDAP  02/10/2024 (Originally 01/20/2020)   HEMOGLOBIN A1C  05/15/2021   OPHTHALMOLOGY EXAM  08/11/2021   Hepatitis C Screening  Completed   PNA vac Low Risk Adult  Completed   HPV VACCINES  Aged Out    Health Maintenance  Health Maintenance Due  Topic Date Due   Zoster Vaccines- Shingrix (1 of 2) Never done   COVID-19 Vaccine (4 - Booster for Pfizer series) 08/26/2020   COLONOSCOPY (Pts 45-413yrInsurance coverage will need to be confirmed)  12/16/2020   FOOT EXAM  01/23/2021   INFLUENZA VACCINE  02/08/2021    Colorectal cancer screening: due, will discuss with provider at physical   Lung Cancer Screening: (Low Dose CT Chest recommended if Age 76-80ears, 30 pack-year currently smoking OR have quit w/in 15 years.) does not qualify.   Additional Screening:  Hepatitis C Screening: does qualify; Completed 11/20/2015  Vision Screening: Recommended annual ophthalmology exams for early detection of glaucoma and other disorders of the eye. Is the patient up to date with their annual eye exam?  Yes  Who is the provider or what is the name of the office in which the patient attends annual eye exams? [facility]  If pt is not established with a provider, would they like to be referred to a provider to establish care? No .   Dental Screening: Recommended annual dental exams for proper oral hygiene  Community Resource Referral / Chronic Care Management: CRR required this visit?  No   CCM required this visit?  No      Plan:     I have personally reviewed and noted the following in the patient's chart:   Medical and social history Use of alcohol,  tobacco or illicit drugs  Current medications and supplements including opioid prescriptions. Patient is not currently taking opioid prescriptions. Functional ability and status Nutritional status Physical activity Advanced directives List of other physicians Hospitalizations, surgeries, and ER visits in previous 12 months Vitals Screenings to include cognitive, depression, and falls Referrals and appointments  In addition, I have reviewed and discussed with patient certain preventive protocols, quality metrics, and best practice recommendations. A written personalized care plan for preventive services as well as general preventive health recommendations were provided to patient.   Due to this being a telephonic visit, the after visit summary with  patients personalized plan was offered to patient via office or my-chart. Patient preferred to pick up at office at next visit or via mychart.    Andrez Grime, LPN   12/17/4501

## 2021-02-09 NOTE — Progress Notes (Signed)
PCP notes:  Health Maintenance: Colonoscopy- due Shingrix- due Foot exam- due     Abnormal Screenings: none   Patient concerns: none   Nurse concerns: none   Next PCP appt.: 02/16/2021 @ 10:30 am

## 2021-02-10 ENCOUNTER — Other Ambulatory Visit: Payer: Self-pay

## 2021-02-10 ENCOUNTER — Other Ambulatory Visit (INDEPENDENT_AMBULATORY_CARE_PROVIDER_SITE_OTHER): Payer: Medicare Other

## 2021-02-10 DIAGNOSIS — E1151 Type 2 diabetes mellitus with diabetic peripheral angiopathy without gangrene: Secondary | ICD-10-CM

## 2021-02-10 LAB — CBC WITH DIFFERENTIAL/PLATELET
Basophils Absolute: 0.1 10*3/uL (ref 0.0–0.1)
Basophils Relative: 0.8 % (ref 0.0–3.0)
Eosinophils Absolute: 0.2 10*3/uL (ref 0.0–0.7)
Eosinophils Relative: 3.7 % (ref 0.0–5.0)
HCT: 43.3 % (ref 39.0–52.0)
Hemoglobin: 14.6 g/dL (ref 13.0–17.0)
Lymphocytes Relative: 23.5 % (ref 12.0–46.0)
Lymphs Abs: 1.4 10*3/uL (ref 0.7–4.0)
MCHC: 33.6 g/dL (ref 30.0–36.0)
MCV: 92.2 fl (ref 78.0–100.0)
Monocytes Absolute: 0.6 10*3/uL (ref 0.1–1.0)
Monocytes Relative: 9.3 % (ref 3.0–12.0)
Neutro Abs: 3.9 10*3/uL (ref 1.4–7.7)
Neutrophils Relative %: 62.7 % (ref 43.0–77.0)
Platelets: 163 10*3/uL (ref 150.0–400.0)
RBC: 4.7 Mil/uL (ref 4.22–5.81)
RDW: 14.6 % (ref 11.5–15.5)
WBC: 6.2 10*3/uL (ref 4.0–10.5)

## 2021-02-10 LAB — COMPREHENSIVE METABOLIC PANEL
ALT: 13 U/L (ref 0–53)
AST: 13 U/L (ref 0–37)
Albumin: 4 g/dL (ref 3.5–5.2)
Alkaline Phosphatase: 42 U/L (ref 39–117)
BUN: 15 mg/dL (ref 6–23)
CO2: 26 mEq/L (ref 19–32)
Calcium: 9.5 mg/dL (ref 8.4–10.5)
Chloride: 104 mEq/L (ref 96–112)
Creatinine, Ser: 1.2 mg/dL (ref 0.40–1.50)
GFR: 59.03 mL/min — ABNORMAL LOW (ref >60.00)
Glucose, Bld: 178 mg/dL — ABNORMAL HIGH (ref 70–99)
Potassium: 3.9 mEq/L (ref 3.5–5.1)
Sodium: 141 mEq/L (ref 135–145)
Total Bilirubin: 0.6 mg/dL (ref 0.2–1.2)
Total Protein: 6.5 g/dL (ref 6.0–8.3)

## 2021-02-10 LAB — LDL CHOLESTEROL, DIRECT: Direct LDL: 39 mg/dL

## 2021-02-10 LAB — HEMOGLOBIN A1C: Hgb A1c MFr Bld: 9 % — ABNORMAL HIGH (ref 4.6–6.5)

## 2021-02-10 LAB — LIPID PANEL
Cholesterol: 99 mg/dL (ref 0–200)
HDL: 22.9 mg/dL — ABNORMAL LOW (ref >39.00)
NonHDL: 75.92
Total CHOL/HDL Ratio: 4
Triglycerides: 283 mg/dL — ABNORMAL HIGH (ref 0.0–149.0)
VLDL: 56.6 mg/dL — ABNORMAL HIGH (ref 0.0–40.0)

## 2021-02-16 ENCOUNTER — Encounter: Payer: Self-pay | Admitting: Family Medicine

## 2021-02-16 ENCOUNTER — Other Ambulatory Visit: Payer: Self-pay

## 2021-02-16 ENCOUNTER — Ambulatory Visit (INDEPENDENT_AMBULATORY_CARE_PROVIDER_SITE_OTHER): Payer: Medicare Other | Admitting: Family Medicine

## 2021-02-16 VITALS — BP 124/66 | HR 61 | Temp 97.6°F | Ht 68.0 in | Wt 201.0 lb

## 2021-02-16 DIAGNOSIS — E1151 Type 2 diabetes mellitus with diabetic peripheral angiopathy without gangrene: Secondary | ICD-10-CM

## 2021-02-16 DIAGNOSIS — J432 Centrilobular emphysema: Secondary | ICD-10-CM | POA: Diagnosis not present

## 2021-02-16 DIAGNOSIS — I6523 Occlusion and stenosis of bilateral carotid arteries: Secondary | ICD-10-CM

## 2021-02-16 DIAGNOSIS — I10 Essential (primary) hypertension: Secondary | ICD-10-CM

## 2021-02-16 DIAGNOSIS — Z0000 Encounter for general adult medical examination without abnormal findings: Secondary | ICD-10-CM

## 2021-02-16 DIAGNOSIS — Z7189 Other specified counseling: Secondary | ICD-10-CM

## 2021-02-16 DIAGNOSIS — E782 Mixed hyperlipidemia: Secondary | ICD-10-CM | POA: Diagnosis not present

## 2021-02-16 DIAGNOSIS — I739 Peripheral vascular disease, unspecified: Secondary | ICD-10-CM

## 2021-02-16 MED ORDER — BD PEN NEEDLE MINI U/F 31G X 5 MM MISC: 3 refills | Status: DC

## 2021-02-16 NOTE — Patient Instructions (Addendum)
Check your metformin dose.  I want to know about the total daily dose you have been able to tolerate.   Go to the lab on the way out.   If you have mychart we'll likely use that to update you.    Take care.  Glad to see you. We'll see about when to get together when I see your labs.

## 2021-02-16 NOTE — Progress Notes (Signed)
This visit occurred during the SARS-CoV-2 public health emergency.  Safety protocols were in place, including screening questions prior to the visit, additional usage of staff PPE, and extensive cleaning of exam room while observing appropriate contact time as indicated for disinfecting solutions.  Diabetes:  Using medications without difficulties: yes, metformin and insulin.   Hypoglycemic episodes:no Hyperglycemic episodes:no Feet problems:no Blood Sugars averaging: this AM 154, prev up to 215, prior to thatdown to 140s.  Usually 140-180s.   eye exam within last year: yes A1c 9.  He cut out potatoes.  He was expecting A1c to be lower.  His home readings are lower than his prev typical readings.  Fructosamine pending.  D/w pt.    Colonoscopy d/w pt.  He wanted to get through [facility] eval first and then go from there.  Would defer colonoscopy for now.  No GI sx.  No blood in stool.  No black stools.   Tetanus and shingles may be cheaper at pharmacy, d/w pt.   Flu d/w pt.   PNA up to date.   Prostate cancer screening and PSA options (with potential risks and benefits of testing vs not testing) were discussed along with recent recs/guidelines.  He declined testing PSA at this point. Advance directive- wife designated if patient were incapacitated.   Breathing is good.  No cough, no wheeze.  Doing well.    Elevated Cholesterol: Using medications without problems: yes Muscle aches: no Diet compliance: yes Exercise: yes Labs d/w pt.    Hypertension:    Using medication without problems or lightheadedness: yes Chest pain with exertion:no Edema:no Short of breath:no  He is going to f/u with vascular clinic re: h/o L CEA.  He has some local pain at the scar.  He has f/u imaging pending.    No cramping in legs with walking.    PMH and SH reviewed  Meds, vitals, and allergies reviewed.   ROS: Per HPI unless specifically indicated in ROS section   GEN: nad, alert and oriented HEENT:  ncat NECK: supple w/o LA CV: rrr. PULM: ctab, no inc wob ABD: soft, +bs EXT: no edema SKIN: no acute rash  Diabetic foot exam: Normal inspection No skin breakdown No calluses  Normal DP pulse on right foot, decreased dorsalis pedis pulse on left foot. Normal sensation to light touch and monofilament Nails normal

## 2021-02-17 ENCOUNTER — Telehealth: Payer: Self-pay | Admitting: *Deleted

## 2021-02-17 DIAGNOSIS — Z0000 Encounter for general adult medical examination without abnormal findings: Secondary | ICD-10-CM | POA: Insufficient documentation

## 2021-02-17 MED ORDER — METFORMIN HCL 500 MG PO TABS: 500.0000 mg | ORAL_TABLET | Freq: Two times a day (BID) | ORAL | Status: DC

## 2021-02-17 NOTE — Assessment & Plan Note (Signed)
Breathing is good.  No cough, no wheeze.  Doing well.  Continue Advair with albuterol as needed.

## 2021-02-17 NOTE — Assessment & Plan Note (Signed)
Continue statin and blood pressure medications.  No claudication with walking.  No skin breakdown.  No ulceration.

## 2021-02-17 NOTE — Assessment & Plan Note (Signed)
  He is going to f/u with vascular clinic re: h/o L CEA.  He has some local pain at the scar.  He has f/u imaging pending.

## 2021-02-17 NOTE — Assessment & Plan Note (Signed)
Colonoscopy d/w pt.  He wanted to get through L CEA eval first and then go from there.  Would defer colonoscopy for now.  No GI sx.  No blood in stool.  No black stools.   Tetanus and shingles may be cheaper at pharmacy, d/w pt.   Flu d/w pt.   PNA up to date.   Prostate cancer screening and PSA options (with potential risks and benefits of testing vs not testing) were discussed along with recent recs/guidelines.  He declined testing PSA at this point. Advance directive- wife designated if patient were incapacitated.

## 2021-02-17 NOTE — Assessment & Plan Note (Signed)
Controlled.  Continue lisinopril metoprolol and amlodipine.

## 2021-02-17 NOTE — Telephone Encounter (Signed)
Patient left a voicemail stating that he was seen yesterday. Patient stated the directions on his Metformin on his med sheet is incorrect. Patient stated that he is only taking one Metformin two times a day. Patient stated that he was not able to take it as listed on his sheet because his stomach could not tolerate it. Patient stated that he would like the medication list corrected.

## 2021-02-17 NOTE — Assessment & Plan Note (Signed)
Advance directive- wife designated if patient were incapacitated.  

## 2021-02-17 NOTE — Assessment & Plan Note (Signed)
Blood Sugars averaging: this AM 154, prev up to 215, prior to thatdown to 140s.  Usually 140-180s.   eye exam within last year: yes A1c 9.  He cut out potatoes.  He was expecting A1c to be lower.  His home readings are lower than his prev typical readings.  Fructosamine pending.  D/w pt. I want to see his fructosamine level and go from there. Continue metformin and insulin for now.

## 2021-02-17 NOTE — Telephone Encounter (Signed)
We talked about this yesterday at the OV.  I thought this was the case.  I asked him to check on his dose at home.  I appreciate the info.  I updated his chart.

## 2021-02-17 NOTE — Assessment & Plan Note (Signed)
-   Continue fenofibrate and Lipitor 

## 2021-02-21 ENCOUNTER — Other Ambulatory Visit: Payer: Self-pay | Admitting: Family Medicine

## 2021-02-21 DIAGNOSIS — E1151 Type 2 diabetes mellitus with diabetic peripheral angiopathy without gangrene: Secondary | ICD-10-CM

## 2021-02-21 LAB — FRUCTOSAMINE: Fructosamine: 301 umol/L — ABNORMAL HIGH (ref 205–285)

## 2021-02-22 ENCOUNTER — Other Ambulatory Visit: Payer: Self-pay | Admitting: Cardiovascular Disease

## 2021-02-22 ENCOUNTER — Ambulatory Visit
Admission: RE | Admit: 2021-02-22 | Discharge: 2021-02-22 | Disposition: A | Discharge status: Home / Self Care | Payer: Medicare Other | Source: Ambulatory Visit | Attending: Nurse Practitioner | Admitting: Nurse Practitioner

## 2021-02-22 ENCOUNTER — Other Ambulatory Visit: Payer: Self-pay

## 2021-02-22 DIAGNOSIS — Z9889 Other specified postprocedural states: Secondary | ICD-10-CM | POA: Diagnosis not present

## 2021-02-22 DIAGNOSIS — I672 Cerebral atherosclerosis: Secondary | ICD-10-CM | POA: Diagnosis not present

## 2021-02-22 DIAGNOSIS — I6523 Occlusion and stenosis of bilateral carotid arteries: Secondary | ICD-10-CM | POA: Diagnosis not present

## 2021-02-22 DIAGNOSIS — I6503 Occlusion and stenosis of bilateral vertebral arteries: Secondary | ICD-10-CM | POA: Diagnosis not present

## 2021-02-22 MED ORDER — IOHEXOL 350 MG/ML SOLN
75.0000 mL | Freq: Once | INTRAVENOUS | Status: AC | PRN
  Administered 2021-02-22: 75 mL via INTRAVENOUS

## 2021-02-23 ENCOUNTER — Other Ambulatory Visit (INDEPENDENT_AMBULATORY_CARE_PROVIDER_SITE_OTHER): Payer: Self-pay | Admitting: Nurse Practitioner

## 2021-02-23 DIAGNOSIS — I739 Peripheral vascular disease, unspecified: Secondary | ICD-10-CM

## 2021-02-25 ENCOUNTER — Other Ambulatory Visit: Payer: Self-pay

## 2021-02-25 ENCOUNTER — Encounter (INDEPENDENT_AMBULATORY_CARE_PROVIDER_SITE_OTHER): Payer: Self-pay | Admitting: Vascular Surgery

## 2021-02-25 ENCOUNTER — Ambulatory Visit (INDEPENDENT_AMBULATORY_CARE_PROVIDER_SITE_OTHER): Payer: Medicare Other

## 2021-02-25 ENCOUNTER — Ambulatory Visit (INDEPENDENT_AMBULATORY_CARE_PROVIDER_SITE_OTHER): Payer: Medicare Other | Admitting: Vascular Surgery

## 2021-02-25 VITALS — BP 130/70 | HR 60 | Resp 16 | Wt 201.0 lb

## 2021-02-25 DIAGNOSIS — I739 Peripheral vascular disease, unspecified: Secondary | ICD-10-CM

## 2021-02-25 DIAGNOSIS — I714 Abdominal aortic aneurysm, without rupture, unspecified: Secondary | ICD-10-CM

## 2021-02-25 DIAGNOSIS — I6523 Occlusion and stenosis of bilateral carotid arteries: Secondary | ICD-10-CM

## 2021-02-25 DIAGNOSIS — I2510 Atherosclerotic heart disease of native coronary artery without angina pectoris: Secondary | ICD-10-CM | POA: Diagnosis not present

## 2021-02-25 DIAGNOSIS — I10 Essential (primary) hypertension: Secondary | ICD-10-CM

## 2021-02-25 DIAGNOSIS — J432 Centrilobular emphysema: Secondary | ICD-10-CM

## 2021-02-28 ENCOUNTER — Encounter (INDEPENDENT_AMBULATORY_CARE_PROVIDER_SITE_OTHER): Payer: Self-pay | Admitting: Vascular Surgery

## 2021-02-28 NOTE — Progress Notes (Signed)
MRN : [id number]  [name] is a 75 y.o. (March 10, 1946) male who presents with chief complaint of follow up for my leg.  History of Present Illness:   The patient returns to the office for followup and review of the noninvasive studies. There have been no interval changes in lower extremity symptoms. No interval shortening of the patient's claudication distance or development of rest pain symptoms. No new ulcers or wounds have occurred since the last visit.  He is also followed for carotid stenosis.  He is s/p left CEA on 09/21/2018.  The carotid stenosis followed by ultrasound.   The patient denies amaurosis fugax. There is no recent history of TIA symptoms or focal motor deficits. There is no prior documented CVA.  The patient is taking enteric-coated aspirin 81 mg daily.  The patient is also followed for  a known abdominal aortic aneurysm. Patient denies abdominal pain or back pain, no other abdominal complaints. No changes suggesting embolic episodes.   There have been no significant changes to the patient's overall health care.  The patient denies history of DVT, PE or superficial thrombophlebitis. The patient denies recent episodes of angina or shortness of breath.   ABI Rt=1.12 and Lt=1.09  (previous ABI's Rt=1.15 and Lt=1.14)  Previous carotid duplex showed a 1 to 39% stenosis in the right ICA with a 40 to 59% stenosis in the left ICA.  Notes that there is postoperative narrowing seen in the mid to distal ICA.  The jugular vein is patent with no evidence of DVT.  The bilateral vertebral arteries demonstrate antegrade flow with normal flow hemodynamics in the bilateral subclavian arteries.  Current Meds  Medication Sig   acetaminophen (TYLENOL) 325 MG tablet Take 1-2 tablets (325-650 mg total) by mouth every 4 (four) hours as needed for mild pain (or temp >/= 101 F).   albuterol (PROAIR HFA) 108 (90 BASE) MCG/ACT inhaler Inhale 2 puffs into the lungs 3 (three) times daily as  needed (cough).   amLODipine (NORVASC) 5 MG tablet Take 1 tablet (5 mg total) by mouth daily.   aspirin 81 MG EC tablet Take 1 tablet (81 mg total) by mouth daily.   atorvastatin (LIPITOR) 40 MG tablet TAKE 1 TABLET(40 MG) BY MOUTH DAILY   blood glucose meter kit and supplies KIT 1 each by Does not apply route daily as needed. Dispense based on patient and insurance preference. Use up to four times daily as directed. (FOR ICD-9 250.00, 250.01).   BRILINTA 60 MG TABS tablet TAKE 1 TABLET BY MOUTH TWICE DAILY   fenofibrate (TRICOR) 145 MG tablet TAKE 1 TABLET(145 MG) BY MOUTH DAILY   Fluticasone-Salmeterol (ADVAIR DISKUS) 250-50 MCG/DOSE AEPB Inhale 1 puff into the lungs 2 (two) times daily.   glucosamine-chondroitin 500-400 MG tablet Take 1 tablet by mouth 2 (two) times daily.   glucose blood (ONETOUCH ULTRA) test strip CHECK SUGAR TWICE DAILY   insulin glargine (LANTUS SOLOSTAR) 100 UNIT/ML Solostar Pen ADMINISTER 90-100 UNITS UNDER THE SKIN DAILY   Insulin Pen Needle (B-D UF III MINI PEN NEEDLES) 31G X 5 MM MISC Use as instructed to inject insulin.  Diagnosis:  E11.9  Insulin dependent.   lisinopril (ZESTRIL) 40 MG tablet TAKE 1 TABLET BY MOUTH EVERY DAY   meclizine (ANTIVERT) 25 MG tablet Take 25 mg by mouth every 6 (six) hours as needed. For dizziness/vertigo   metFORMIN (GLUCOPHAGE) 500 MG tablet Take 1 tablet (500 mg total) by mouth 2 (two) times daily with a  meal.   metoprolol tartrate (LOPRESSOR) 25 MG tablet TAKE 1/2 TABLET BY MOUTH TWICE DAILY   Multiple Vitamins-Minerals (PRESERVISION AREDS 2 PO) Take 1 tablet by mouth 2 (two) times daily.   nitroGLYCERIN (NITROSTAT) 0.4 MG SL tablet Place 1 tablet (0.4 mg total) under the tongue every 5 (five) minutes as needed. For chest pain    Past Medical History:  Diagnosis Date   CAD (coronary artery disease)    a. 1999: PCI-->LAD 2/2 MI; b. 2005: inf MI s/p PCI/DES x 4 to RCA; c. Myoview in 12/2005: EF 61%, no evidence for ischemia; d. cath  10/2014: occluded mLAD w/ L to L and L to R collats, dRCA 95% s/p PCI/DES 0%, mPDA 70%, LCx mild to mod irregs, procedure complicated by inf ST ele in recovery, repeat cath showed acute dRCA stent thrombosis o/w occluded mRPDA, PTCA dRCA, PCI/DES RPDA, aggrastat x 18 hr   CKD (chronic kidney disease)    COPD (chronic obstructive pulmonary disease) (HCC)    DM2 (diabetes mellitus, type 2) ([place])    H/O hiatal hernia    Heart attack ([place]) [phone]   had 2 in one day(released plaque during angio). 5 stents total   HTN (hypertension)    Hypercholesterolemia    Impaired fasting glucose    Elevated after steroid injection   Macular degeneration of right eye 2020   receiving injections into eye   Osteoarthritis of hip    injections in both hips   Osteoarthritis, knee    PAD (peripheral artery disease) (HCC)    L fem to below the knee popliteal vein bypass graft   Peri-rectal abscess 12/27/2011   Tobacco abuse    Prior    Past Surgical History:  Procedure Laterality Date   [place], 2006, 2016 x 2   Multiple, LAD in 1999, RCA in 2005.  has a total of 4 stents   ENDARTERECTOMY Left 09/21/2018   Procedure: ENDARTERECTOMY CAROTID;  Surgeon: Katha Cabal, MD;  Location: [facility];  Service: Vascular;  Laterality: Left;   FEMORAL-POPLITEAL BYPASS GRAFT  2016   L fem to below the knee popliteal vein bypass graft   INCISE AND DRAIN ABCESS  2007   on scrotum   INCISION AND DRAINAGE PERIRECTAL ABSCESS  12/28/2011   Procedure: [place];  Surgeon: Merrie Roof, MD;  Location: [facility];  Service: General;  Laterality: N/A;    Social History Social History   Tobacco Use   Smoking status: Former    Packs/day: 1.00    Years: 40.00    Pack years: 40.00    Types: Cigarettes    Quit date: 10/21/1998    Years since quitting: 22.3   Smokeless tobacco: Never   Tobacco comments:    Past smoker, states he  will smoke maybe 1 cigarette/ month or so still  Vaping Use   Vaping Use: Never used  Substance Use Topics   Alcohol use: Not Currently    Alcohol/week: 0.0 standard drinks    Comment: Occasional beer on the weekends   Drug use: No    Family History Family History  Problem Relation Age of Onset   Heart failure Father        CHF   CAD Mother    Diabetes Mother    Colon cancer Neg Hx    Prostate cancer Neg Hx     Allergies  Allergen Reactions   Augmentin [  Amoxicillin-Pot Clavulanate] Other (See Comments)    Nausea,vomiting,diarrhea   Morphine And Related Nausea And Vomiting    Vomiting, GI upset   Metformin And Related Other (See Comments)    Intolerant of 2073m a day.      REVIEW OF SYSTEMS (Negative unless checked)  Constitutional: _0 Weight loss  _1 Fever  _2 Chills Cardiac: _3 Chest pain   _4 Chest pressure   _5 Palpitations   _6 Shortness of breath when laying flat   _7 Shortness of breath with exertion. Vascular:  _8 Pain in legs with walking   _9 Pain in legs at rest  _10 History of DVT   _11 Phlebitis   _12 Swelling in legs   _13 Varicose veins   _14 Non-healing ulcers Pulmonary:   _15 Uses home oxygen   _16 Productive cough   _17 Hemoptysis   _18 Wheeze  _19 COPD   _20 Asthma Neurologic:  _21 Dizziness   _22 Seizures   _23 History of stroke   _24 History of TIA  _25 Aphasia   _26 Vissual changes   _27 Weakness or numbness in arm   _28 Weakness or numbness in leg Musculoskeletal:   _29 Joint swelling   _30 Joint pain   _31 Low back pain Hematologic:  _32 Easy bruising  _33 Easy bleeding   _34 Hypercoagulable state   _35 Anemic Gastrointestinal:  _36 Diarrhea   _37 Vomiting  _38 Gastroesophageal reflux/heartburn   _39 Difficulty swallowing. Genitourinary:  _40 Chronic kidney disease   _41 Difficult urination  _42 Frequent urination   _43 Blood in urine Skin:  _44 Rashes   _45 Ulcers  Psychological:  _46 History of anxiety   _47  History of major depression.  Physical Examination  Vitals:   02/25/21 1602  BP: 130/70  Pulse: 60  Resp: 16   Weight: 201 lb (91.2 kg)   Body mass index is 30.56 kg/m. Gen: WD/WN, NAD Head: [place]/AT, No temporalis wasting.  Ear/Nose/Throat: Hearing grossly intact, nares w/o erythema or drainage Eyes: PER, EOMI, sclera nonicteric.  Neck: Supple, no masses.  No bruit or JVD.  Pulmonary:  Good air movement, no audible wheezing, no use of accessory muscles.  Cardiac: RRR, normal S1, S2, no Murmurs. Vascular:   well healed left CEA incisional scar, no bruits  Vessel Right Left  Radial Palpable Palpable  Carotid Palpable Palpable  PT Palpable Not Palpable  DP Palpable Not Palpable  Gastrointestinal: soft, non-distended. No guarding/no peritoneal signs.  Musculoskeletal: M/S 5/5 throughout.  No visible deformity.  Neurologic: CN 2-12 intact. Pain and light touch intact in extremities.  Symmetrical.  Speech is fluent. Motor exam as listed above. Psychiatric: Judgment intact, Mood & affect appropriate for pt's clinical situation. Dermatologic: No rashes or ulcers noted.  No changes consistent with cellulitis.   CBC Lab Results  Component Value Date   WBC 6.2 02/10/2021   HGB 14.6 02/10/2021   HCT 43.3 02/10/2021   MCV 92.2 02/10/2021   PLT 163.0 02/10/2021    BMET    Component Value Date/Time   NA 141 02/10/2021 0740   NA 134 (L) 10/29/2014 0908   K 3.9 02/10/2021 0740   K 4.3 10/29/2014 0908   CL 104 02/10/2021 0740   CL 103 10/29/2014 0908   CO2 26 02/10/2021 0740   CO2 24 10/29/2014 0908   GLUCOSE 178 (H) 02/10/2021 0740   GLUCOSE 142 (H) 10/29/2014 0908   BUN 15 02/10/2021 0740   BUN 20 10/29/2014 0908   CREATININE 1.20 02/10/2021 0740   CREATININE 1.32 (H) 10/29/2014 0908   CREATININE 0.96 01/28/2011 0855   CALCIUM 9.5 02/10/2021 0740   CALCIUM 9.3 10/29/2014 0908   GFRNONAA 49 (L) 11/22/2019 0952   GFRNONAA 55 (L) 10/29/2014 0908  GFRAA 56 (L) 11/22/2019 0952   GFRAA >60 10/29/2014 0908   Estimated Creatinine Clearance: 58.3 mL/min (by C-G formula based on SCr of 1.2  mg/dL).  COAG Lab Results  Component Value Date   INR 1.0 09/14/2018   INR 0.9 10/24/2014   INR 1.07 12/28/2011    Radiology CT ANGIO NECK W OR WO CONTRAST  Result Date: 02/22/2021 CLINICAL DATA:  Carotid artery stenosis; arterial complication, postprocedure; technologist note states patient complains of left neck discomfort with history endarterectomy in March 20 EXAM: CT ANGIOGRAPHY NECK TECHNIQUE: Multidetector CT imaging of the neck was performed using the standard protocol during bolus administration of intravenous contrast. Multiplanar CT image reconstructions and MIPs were obtained to evaluate the vascular anatomy. Carotid stenosis measurements (when applicable) are obtained utilizing NASCET criteria, using the distal internal carotid diameter as the denominator. CONTRAST:  65m OMNIPAQUE IOHEXOL 350 MG/ML SOLN COMPARISON:  07/24/2018 FINDINGS: Aortic arch: Minimal mixed plaque along the aortic arch. Great vessel origins are patent. There is mixed plaque along the proximal subclavian arteries without significant stenosis. Right carotid system: Common carotid is patent with mild noncalcified plaque. Internal and external carotid arteries are patent. There is mixed but primarily calcified plaque along the proximal internal carotid with approximately 40% stenosis. Noncalcified plaque along the proximal external carotid. Left carotid system: Common carotid is patent with mild mixed plaque. Internal and external carotid arteries are patent. Interval endarterectomy. Stenosis is substantially improved. There is 40% stenosis where the proximal internal carotid has a nearly 90 degree turn. Vertebral arteries: Codominant extracranial vertebral arteries are patent. There is calcified plaque at the right vertebral origin with mild stenosis. Skeleton: Degenerative changes of the included spine Other neck: No neck mass or adenopathy. Upper chest: No apical lung mass. IMPRESSION: Interval carotid endarterectomy  on the left with substantial improvement in stenosis. There is 40% stenosis where the proximal ICA takes a nearly 90 degree turn. Plaque at the proximal right ICA with approximately 40% stenosis. Electronically Signed   By: PMacy MisM.D.   On: 02/22/2021 20:08   VAS [place]I WITH/WO TBI  Result Date: 02/25/2021  LOWER EXTREMITY DOPPLER STUDY Patient Name:  [name] Date of Exam:   02/25/2021 Medical Rec #: [id number]       Accession #:    [id number] of Birth: [birth year]       Patient Gender: M Patient Age:   89years Exam Location:  [facility] Procedure:      VAS [place]I WITH/WO TBI Referring Phys: FEulogio Ditch--------------------------------------------------------------------------------  Indications: Peripheral artery disease.  Vascular Interventions: Left popliteal artery BPG in 2016 for occluded popliteal                         artery aneurysm;. Comparison Study: 07/16/2020 Performing Technologist: SAlmira CoasterRVS  Examination Guidelines: A complete evaluation includes at minimum, Doppler waveform signals and systolic blood pressure reading at the level of bilateral brachial, anterior tibial, and posterior tibial arteries, when vessel segments are accessible. Bilateral testing is considered an integral part of a complete examination. Photoelectric Plethysmograph (PPG) waveforms and toe systolic pressure readings are included as required and additional duplex testing as needed. Limited examinations for reoccurring indications may be performed as noted.  ABI Findings: +---------+------------------+-----+---------+--------+ Right    Rt Pressure (mmHg)IndexWaveform Comment  +---------+------------------+-----+---------+--------+ Brachial 146                                      +---------+------------------+-----+---------+--------+  ATA      156                    triphasic1.07     +---------+------------------+-----+---------+--------+ PTA      164                1.12 triphasic         +---------+------------------+-----+---------+--------+ Great Toe157               1.08 Normal            +---------+------------------+-----+---------+--------+ +---------+------------------+-----+---------+-------+ Left     Lt Pressure (mmHg)IndexWaveform Comment +---------+------------------+-----+---------+-------+ Brachial 145                                     +---------+------------------+-----+---------+-------+ ATA      159                    triphasic1.09    +---------+------------------+-----+---------+-------+ PTA      158               1.08 triphasic        +---------+------------------+-----+---------+-------+ Galvin Proffer               0.84 Normal           +---------+------------------+-----+---------+-------+ +-------+-----------+-----------+------------+------------+ ABI/TBIToday's ABIToday's TBIPrevious ABIPrevious TBI +-------+-----------+-----------+------------+------------+ Right  1.12       1.08       1.15        1.37         +-------+-----------+-----------+------------+------------+ Left   1.09       1.09       1.14        .92          +-------+-----------+-----------+------------+------------+  Bilateral ABIs appear essentially unchanged compared to prior study on 07/16/2020. Right TBIs appear essentially unchanged compared to prior study on 07/16/2020. The Left TBIs aapear to be increased compared to prior study on 07/16/2020.  Summary: Right: Resting right ankle-brachial index is within normal range. No evidence of significant right lower extremity arterial disease. The right toe-brachial index is normal. Left: Resting left ankle-brachial index is within normal range. No evidence of significant left lower extremity arterial disease. The left toe-brachial index is normal.  *See table(s) above for measurements and observations.  Suggest follow up study in 12 months. Electronically signed by Hortencia Pilar MD on 02/25/2021 at 5:13:49 PM.    Final    VAS US CAROTID  Result Date: 02/05/2021 Carotid Arterial Duplex Study Patient Name:  [name]  Date of Exam:   02/05/2021 Medical Rec #: [id number]        Accession #:    [id number] Date of Birth: 1945-10-12        Patient Gender: M Patient Age:   29Y Exam Location:  [facility] Procedure:      VAS US CAROTID Referring Phys: [id number] Kris Hartmann --------------------------------------------------------------------------------  Indications:       Carotid artery disease and left endarterectomy. Other Factors:     Left neck swelling according to patient.                    Left carotid endarterectomy on 09/21/18. Comparison Study:  07/2020 Performing Technologist: Concha Norway RVT  Examination Guidelines: A complete evaluation includes B-mode imaging, spectral Doppler, color Doppler, and power Doppler as needed of all accessible portions of each  vessel. Bilateral testing is considered an integral part of a complete examination. Limited examinations for reoccurring indications may be performed as noted.  Right Carotid Findings: +----------+--------+--------+--------+------------------+--------+           PSV cm/sEDV cm/sStenosisPlaque DescriptionComments +----------+--------+--------+--------+------------------+--------+ CCA Prox  53      12                                         +----------+--------+--------+--------+------------------+--------+ CCA Mid   55      11                                         +----------+--------+--------+--------+------------------+--------+ CCA Distal55      13                                         +----------+--------+--------+--------+------------------+--------+ ICA Prox  51      14      1-39%   heterogenous               +----------+--------+--------+--------+------------------+--------+ ICA Mid   61      16                                          +----------+--------+--------+--------+------------------+--------+ ICA Distal52      12                                         +----------+--------+--------+--------+------------------+--------+ ECA       140     6                                          +----------+--------+--------+--------+------------------+--------+ +----------+--------+-------+----------------+-------------------+           PSV cm/sEDV cmsDescribe        Arm Pressure (mmHG) +----------+--------+-------+----------------+-------------------+ [id number]             Multiphasic, WNL                    +----------+--------+-------+----------------+-------------------+ +---------+--------+--+--------+-+---------+ VertebralPSV cm/s31EDV cm/s6Antegrade +---------+--------+--+--------+-+---------+  Left Carotid Findings: +----------+--------+--------+--------+------------------+-----------------+           PSV cm/sEDV cm/sStenosisPlaque DescriptionComments          +----------+--------+--------+--------+------------------+-----------------+ CCA Prox  52      13                                                  +----------+--------+--------+--------+------------------+-----------------+ CCA Mid   65      15                                                  +----------+--------+--------+--------+------------------+-----------------+  CCA Distal72      15                                                  +----------+--------+--------+--------+------------------+-----------------+ ICA Prox  71      20                                                  +----------+--------+--------+--------+------------------+-----------------+ ICA Mid   122     24                                                  +----------+--------+--------+--------+------------------+-----------------+ ICA Distal142     26      40-59%                    post op narrowing  +----------+--------+--------+--------+------------------+-----------------+ ECA       71      7                                                   +----------+--------+--------+--------+------------------+-----------------+ +----------+--------+--------+----------------+-------------------+           PSV cm/sEDV cm/sDescribe        Arm Pressure (mmHG) +----------+--------+--------+----------------+-------------------+ [id number]              Multiphasic, WNL                    +----------+--------+--------+----------------+-------------------+ +---------+--------+--+--------+-+---------+ VertebralPSV cm/s29EDV cm/s6Antegrade +---------+--------+--+--------+-+---------+   Summary: Right Carotid: Velocities in the right ICA are consistent with a 1-39% stenosis.                Mild heterogenous. Left Carotid: Velocities in the left ICA are consistent with a 40-59% stenosis.               Post op narrowing again seen in the mid to distal ICA with               improved flow. Jugular vein is patent with no evidence of DVT. Vertebrals:  Bilateral vertebral arteries demonstrate antegrade flow. Subclavians: Normal flow hemodynamics were seen in bilateral subclavian              arteries. *See table(s) above for measurements and observations. Suggest follow up study in 12 months.    Preliminary      Assessment/Plan 1. PAD (peripheral artery disease) (HCC)  Recommend:  The patient has evidence of atherosclerosis of the lower extremities with claudication.  The patient does not voice lifestyle limiting changes at this point in time.  Noninvasive studies do not suggest clinically significant change.  No invasive studies, angiography or surgery at this time The patient should continue walking and begin a more formal exercise program.  The patient should continue antiplatelet therapy and aggressive treatment of the lipid abnormalities  No changes in the patient's medications at this  time  2. AAA (abdominal aortic aneurysm) without rupture (HCC) No surgery  or intervention at this time. The patient has an asymptomatic abdominal aortic aneurysm that is less than 4 cm in maximal diameter.  I have discussed the natural history of abdominal aortic aneurysm and the small risk of rupture for aneurysm less than 5 cm in size.  However, as these small aneurysms tend to enlarge over time, continued surveillance with ultrasound or CT scan is mandatory.  I have also discussed optimizing medical management with hypertension and lipid control and the importance of abstinence from tobacco.  The patient is also encouraged to exercise a minimum of 30 minutes 4 times a week.  Should the patient develop new onset abdominal or back pain or signs of peripheral embolization they are instructed to seek medical attention immediately and to alert the physician providing care that they have an aneurysm.  The patient voices their understanding. The patient will return in 12 months with an aortic duplex.   3. Bilateral carotid artery stenosis Recommend:  Given the patient's asymptomatic subcritical stenosis no further invasive testing or surgery at this time.  Duplex ultrasound shows RICA <24% and LICA 46-28% stenosis.  Continue antiplatelet therapy as prescribed Continue management of CAD, HTN and Hyperlipidemia Healthy heart diet,  encouraged exercise at least 4 times per week Follow up in 6 months with duplex ultrasound and physical exam    4. Coronary artery disease involving native coronary artery of native heart without angina pectoris Continue cardiac and antihypertensive medications as already ordered and reviewed, no changes at this time.  Continue statin as ordered and reviewed, no changes at this time  Nitrates PRN for chest pain   5. Essential hypertension Continue antihypertensive medications as already ordered, these medications have been reviewed and there are no changes at this  time.   6. Centrilobular emphysema ([place]) Continue pulmonary medications and aerosols as already ordered, these medications have been reviewed and there are no changes at this time.      Hortencia Pilar, MD  02/28/2021 11:54 AM

## 2021-03-19 ENCOUNTER — Other Ambulatory Visit: Payer: Self-pay | Admitting: Cardiovascular Disease

## 2021-03-21 ENCOUNTER — Emergency Department
Admission: EM | Admit: 2021-03-21 | Discharge: 2021-03-22 | Disposition: A | Discharge status: Home / Self Care | Payer: Medicare Other | Attending: Emergency Medicine | Admitting: Emergency Medicine

## 2021-03-21 ENCOUNTER — Other Ambulatory Visit: Payer: Self-pay

## 2021-03-21 DIAGNOSIS — Z794 Long term (current) use of insulin: Secondary | ICD-10-CM | POA: Insufficient documentation

## 2021-03-21 DIAGNOSIS — Z7984 Long term (current) use of oral hypoglycemic drugs: Secondary | ICD-10-CM | POA: Diagnosis not present

## 2021-03-21 DIAGNOSIS — T782XXA Anaphylactic shock, unspecified, initial encounter: Secondary | ICD-10-CM | POA: Diagnosis not present

## 2021-03-21 DIAGNOSIS — I2510 Atherosclerotic heart disease of native coronary artery without angina pectoris: Secondary | ICD-10-CM | POA: Insufficient documentation

## 2021-03-21 DIAGNOSIS — Z79899 Other long term (current) drug therapy: Secondary | ICD-10-CM | POA: Diagnosis not present

## 2021-03-21 DIAGNOSIS — Z87891 Personal history of nicotine dependence: Secondary | ICD-10-CM | POA: Insufficient documentation

## 2021-03-21 DIAGNOSIS — N189 Chronic kidney disease, unspecified: Secondary | ICD-10-CM | POA: Insufficient documentation

## 2021-03-21 DIAGNOSIS — I129 Hypertensive chronic kidney disease with stage 1 through stage 4 chronic kidney disease, or unspecified chronic kidney disease: Secondary | ICD-10-CM | POA: Insufficient documentation

## 2021-03-21 DIAGNOSIS — J449 Chronic obstructive pulmonary disease, unspecified: Secondary | ICD-10-CM | POA: Insufficient documentation

## 2021-03-21 DIAGNOSIS — T7840XA Allergy, unspecified, initial encounter: Secondary | ICD-10-CM | POA: Diagnosis present

## 2021-03-21 DIAGNOSIS — Z7982 Long term (current) use of aspirin: Secondary | ICD-10-CM | POA: Diagnosis not present

## 2021-03-21 DIAGNOSIS — E119 Type 2 diabetes mellitus without complications: Secondary | ICD-10-CM | POA: Diagnosis not present

## 2021-03-21 DIAGNOSIS — T7809XA Anaphylactic reaction due to other food products, initial encounter: Secondary | ICD-10-CM | POA: Diagnosis not present

## 2021-03-21 DIAGNOSIS — Z95820 Peripheral vascular angioplasty status with implants and grafts: Secondary | ICD-10-CM | POA: Diagnosis not present

## 2021-03-21 MED ORDER — DIPHENHYDRAMINE HCL 50 MG/ML IJ SOLN
25.0000 mg | Freq: Once | INTRAMUSCULAR | Status: AC
  Administered 2021-03-21: 25 mg via INTRAVENOUS
  Filled 2021-03-21: qty 1

## 2021-03-21 MED ORDER — METHYLPREDNISOLONE SODIUM SUCC 125 MG IJ SOLR
125.0000 mg | Freq: Once | INTRAMUSCULAR | Status: AC
  Administered 2021-03-21: 125 mg via INTRAVENOUS
  Filled 2021-03-21: qty 2

## 2021-03-21 MED ORDER — FAMOTIDINE 20 MG IN NS 100 ML IVPB
20.0000 mg | Freq: Once | INTRAVENOUS | Status: AC
  Administered 2021-03-21: 20 mg via INTRAVENOUS
  Filled 2021-03-21: qty 100

## 2021-03-21 NOTE — ED Triage Notes (Addendum)
Pt states he was eating bojangles tonight when he began to feel itchy throat, numb throat, lip swelling and itching noted to body. Pt states is nauseated. Pt with wheezing noted on exertion. Pt able to speak in full sentences, and no obvious angioedema noted. Per pt his spouse gave him nausea medication pta.

## 2021-03-21 NOTE — ED Provider Notes (Signed)
[facility] Emergency Department Provider Note   ____________________________________________    I have reviewed the triage vital signs and the nursing notes.   HISTORY  Chief Complaint Allergic Reaction     HPI [name] is a 75 y.o. male with history of coronary artery disease, CKD, COPD, diabetes who presents with complaints of allergic reaction.  Patient reports he was eating [employer] at home, started getting a numbness in his mouth and throat, developed itchy rash to his abdomen.  Denies shortness of breath to me.  Did feel nauseated and vomited once.  No history of anaphylaxis or allergic reactions in the past.  Past Medical History:  Diagnosis Date   CAD (coronary artery disease)    a. 1999: PCI-->LAD 2/2 MI; b. 2005: inf MI s/p PCI/DES x 4 to RCA; c. Myoview in 12/2005: EF 61%, no evidence for ischemia; d. cath 10/2014: occluded mLAD w/ L to L and L to R collats, dRCA 95% s/p PCI/DES 0%, mPDA 70%, LCx mild to mod irregs, procedure complicated by inf ST ele in recovery, repeat cath showed acute dRCA stent thrombosis o/w occluded mRPDA, PTCA dRCA, PCI/DES RPDA, aggrastat x 18 hr   CKD (chronic kidney disease)    COPD (chronic obstructive pulmonary disease) (HCC)    DM2 (diabetes mellitus, type 2) ([place])    H/O hiatal hernia    Heart attack ([place]) [phone]   had 2 in one day(released plaque during angio). 5 stents total   HTN (hypertension)    Hypercholesterolemia    Impaired fasting glucose    Elevated after steroid injection   Macular degeneration of right eye 2020   receiving injections into eye   Osteoarthritis of hip    injections in both hips   Osteoarthritis, knee    PAD (peripheral artery disease) (HCC)    L fem to below the knee popliteal vein bypass graft   Peri-rectal abscess 12/27/2011   Tobacco abuse    Prior    Patient Active Problem List   Diagnosis Date Noted   Health care maintenance 02/17/2021   Aortic  ectasia, abdominal ([place]) 07/17/2019   Lower abdominal pain 06/27/2019   Carotid artery stenosis without cerebral infarction, left 09/21/2018   Abnormal dreams 08/05/2018   Creatinine elevation 08/05/2018   Dysuria 01/14/2018   CAD (coronary artery disease), native coronary artery 07/16/2017   Shoulder pain 12/30/2016   Skin lesion 12/30/2016   Unstable angina ([place]) 09/03/2016   AAA (abdominal aortic aneurysm) without rupture ([place]) 07/13/2016   Popliteal artery aneurysm ([place]) 07/13/2016   Carotid artery disease ([place]) 01/30/2015   NSTEMI (non-ST elevated myocardial infarction) ([place]) 11/16/2014   Medicare annual wellness visit, initial 10/21/2014   Advance care planning 10/21/2014   PAD (peripheral artery disease) ([place]) 09/09/2014   COPD exacerbation ([place]) 08/21/2014   DM (diabetes mellitus), type 2 with peripheral vascular complications ([place]) 20/60/1561   Back pain 02/24/2013   Cough 01/26/2011   COPD (chronic obstructive pulmonary disease) ([place]) 12/26/2010   FATIGUE 05/11/2009   Essential hypertension 05/08/2009   Hyperlipidemia 06/20/2008    Past Surgical History:  Procedure Laterality Date   CORONARY ANGIOPLASTY     CORONARY [place], 2006, 2016 x 2   Multiple, LAD in 1999, RCA in 2005.  has a total of 4 stents   ENDARTERECTOMY Left 09/21/2018   Procedure: ENDARTERECTOMY CAROTID;  Surgeon: Katha Cabal, MD;  Location: [facility] ORS;  Service: Vascular;  Laterality: Left;   FEMORAL-POPLITEAL  BYPASS GRAFT  2016   L fem to below the knee popliteal vein bypass graft   INCISE AND DRAIN ABCESS  2007   on scrotum   INCISION AND DRAINAGE PERIRECTAL ABSCESS  12/28/2011   Procedure: [place];  Surgeon: Merrie Roof, MD;  Location: [place];  Service: General;  Laterality: N/A;    Prior to Admission medications   Medication Sig Start Date End Date Taking? Authorizing Provider  acetaminophen (TYLENOL) 325 MG tablet Take 1-2 tablets  (325-650 mg total) by mouth every 4 (four) hours as needed for mild pain (or temp >/= 101 F). 09/22/18   Juanna Cao, MD  albuterol (PROAIR HFA) 108 (90 BASE) MCG/ACT inhaler Inhale 2 puffs into the lungs 3 (three) times daily as needed (cough). 04/17/13   Tonia Ghent, MD  amLODipine (NORVASC) 5 MG tablet Take 1 tablet (5 mg total) by mouth daily. 04/17/20   Rise Mu, PA-C  aspirin 81 MG EC tablet Take 1 tablet (81 mg total) by mouth daily. 11/19/14   Minna Merritts, MD  atorvastatin (LIPITOR) 40 MG tablet TAKE 1 TABLET(40 MG) BY MOUTH DAILY 08/10/20   Minna Merritts, MD  blood glucose meter kit and supplies KIT 1 each by Does not apply route daily as needed. Dispense based on patient and insurance preference. Use up to four times daily as directed. (FOR ICD-9 250.00, 250.01). 01/12/18   Tonia Ghent, MD  BRILINTA 60 MG TABS tablet TAKE 1 TABLET BY MOUTH TWICE DAILY 09/16/20   Minna Merritts, MD  fenofibrate (TRICOR) 145 MG tablet TAKE 1 TABLET(145 MG) BY MOUTH DAILY 05/18/20   Minna Merritts, MD  Fluticasone-Salmeterol (ADVAIR DISKUS) 250-50 MCG/DOSE AEPB Inhale 1 puff into the lungs 2 (two) times daily. 08/09/19   Tonia Ghent, MD  glucosamine-chondroitin 500-400 MG tablet Take 1 tablet by mouth 2 (two) times daily.    [provider]  glucose blood (ONETOUCH ULTRA) test strip CHECK SUGAR TWICE DAILY 01/12/21   Tonia Ghent, MD  insulin glargine (LANTUS SOLOSTAR) 100 UNIT/ML Solostar Pen ADMINISTER 90-100 UNITS UNDER THE SKIN DAILY 11/12/20   Tonia Ghent, MD  Insulin Pen Needle (B-D UF III MINI PEN NEEDLES) 31G X 5 MM MISC Use as instructed to inject insulin.  Diagnosis:  E11.9  Insulin dependent. 02/16/21   Tonia Ghent, MD  lisinopril (ZESTRIL) 40 MG tablet TAKE 1 TABLET BY MOUTH EVERY DAY 07/08/20   Minna Merritts, MD  meclizine (ANTIVERT) 25 MG tablet Take 25 mg by mouth every 6 (six) hours as needed. For dizziness/vertigo    [provider]   metFORMIN (GLUCOPHAGE) 500 MG tablet Take 1 tablet (500 mg total) by mouth 2 (two) times daily with a meal. 02/17/21   Tonia Ghent, MD  metoprolol tartrate (LOPRESSOR) 25 MG tablet TAKE 1/2 TABLET BY MOUTH TWICE DAILY 02/22/21   Minna Merritts, MD  Multiple Vitamins-Minerals (PRESERVISION AREDS 2 PO) Take 1 tablet by mouth 2 (two) times daily.    [provider]  nitroGLYCERIN (NITROSTAT) 0.4 MG SL tablet Place 1 tablet (0.4 mg total) under the tongue every 5 (five) minutes as needed. For chest pain 10/25/19   Tonia Ghent, MD     Allergies Augmentin [amoxicillin-pot clavulanate], Morphine and related, and Metformin and related  Family History  Problem Relation Age of Onset   Heart failure Father        CHF  CAD Mother    Diabetes Mother    Colon cancer Neg Hx    Prostate cancer Neg Hx     Social History Social History   Tobacco Use   Smoking status: Former    Packs/day: 1.00    Years: 40.00    Pack years: 40.00    Types: Cigarettes    Quit date: 10/21/1998    Years since quitting: 22.4   Smokeless tobacco: Never   Tobacco comments:    Past smoker, states he will smoke maybe 1 cigarette/ month or so still  Vaping Use   Vaping Use: Never used  Substance Use Topics   Alcohol use: Not Currently    Alcohol/week: 0.0 standard drinks    Comment: Occasional beer on the weekends   Drug use: No    Review of Systems  Constitutional: No fever/chills Eyes: No visual changes.  ENT: Numbness, mild lip swelling Cardiovascular: Denies chest pain. Respiratory: Denies shortness of breath. Gastrointestinal: No abdominal pain.  Positive vomiting Genitourinary: Negative for dysuria. Musculoskeletal: Negative for back pain. Skin: Hives Neurological: Negative for headaches or weakness   ____________________________________________   PHYSICAL EXAM:  VITAL SIGNS: ED Triage Vitals [03/21/21 2115]  Enc Vitals Group     BP (!) 131/91     Pulse Rate 81      Resp (!) 24     Temp 98.2 F (36.8 C)     Temp Source Oral     SpO2 96 %     Weight 90.7 kg (200 lb)     Height 1.727 m (5' 8" )     Head Circumference      Peak Flow      Pain Score      Pain Loc      Pain Edu?      Excl. in [place]?     Constitutional: Alert and oriented. No acute distress. Pleasant and interactive Eyes: Conjunctivae are normal.  Head: Atraumatic. Nose: No congestion/rhinnorhea. Mouth/Throat: Mucous membranes are mild swelling of the lower lip, tongue is normal, pharynx appears normal, uvula normal, no stridor Neck:  Painless ROM Cardiovascular: Normal rate, regular rhythm. Grossly normal heart sounds.  Good peripheral circulation. Respiratory: Normal respiratory effort.  No retractions. Lungs CTAB. Gastrointestinal: Soft and nontender. No distention.  No CVA tenderness. Genitourinary: deferred Musculoskeletal: No lower extremity tenderness nor edema.  Warm and well perfused Neurologic:  Normal speech and language. No gross focal neurologic deficits are appreciated.  Skin:  Skin is warm, dry and intact.  Urticaria to the trunk Psychiatric: Mood and affect are normal. Speech and behavior are normal.  ____________________________________________   LABS (all labs ordered are listed, but only abnormal results are displayed)  Labs Reviewed - No data to display ____________________________________________  EKG  None ____________________________________________  RADIOLOGY  None ____________________________________________   PROCEDURES  Procedure(s) performed: No  Procedures   Critical Care performed:yes  CRITICAL CARE Performed by: Lavonia Drafts   Total critical care time: 30 minutes  Critical care time was exclusive of separately billable procedures and treating other patients.  Critical care was necessary to treat or prevent imminent or life-threatening deterioration.  Critical care was time spent personally by me on the following  activities: development of treatment plan with patient and/or surrogate as well as nursing, discussions with consultants, evaluation of patient's response to treatment, examination of patient, obtaining history from patient or surrogate, ordering and performing treatments and interventions, ordering and review of laboratory studies, ordering and review of radiographic studies, pulse  oximetry and re-evaluation of patient's condition.  ____________________________________________   INITIAL IMPRESSION / ASSESSMENT AND PLAN / ED COURSE  Pertinent labs & imaging results that were available during my care of the patient were reviewed by me and considered in my medical decision making (see chart for details).   Patient presents with allergic reaction consistent with anaphylaxis.  Significant history of coronary artery disease noted.  No difficulty breathing at this time, treat with IV Solu-Medrol, IV Pepcid, IV Benadryl and carefully monitor.   ----------------------------------------- 10:23 PM on 03/21/2021 ----------------------------------------- Patient reports nausea has resolved, itching has resolved, still feels a numb sensation in his mouth, no difficulty breathing.  We will continue to monitor.    ____________________________________________   FINAL CLINICAL IMPRESSION(S) / ED DIAGNOSES  Final diagnoses:  Anaphylaxis, initial encounter        Note:  This document was prepared using Dragon voice recognition software and may include unintentional dictation errors.    Lavonia Drafts, MD 03/21/21 2224

## 2021-03-22 MED ORDER — FAMOTIDINE 20 MG PO TABS: 20.0000 mg | ORAL_TABLET | Freq: Two times a day (BID) | ORAL | 0 refills | Status: AC

## 2021-03-22 MED ORDER — PREDNISONE 50 MG PO TABS: ORAL_TABLET | ORAL | 0 refills | Status: DC

## 2021-03-22 MED ORDER — EPINEPHRINE 0.3 MG/0.3ML IJ SOAJ: 0.3000 mg | Freq: Once | INTRAMUSCULAR | 2 refills | Status: AC

## 2021-03-22 NOTE — Discharge Instructions (Signed)
1. Take the following medicines for the next 4 days: Prednisone 60mg daily Pepcid 20mg twice daily 2. Take Benadryl as needed for itching. 3. Use Epi-Pen in case of acute, life-threatening allergic reaction. 4. Return to the ER for worsening symptoms, persistent vomiting, difficulty breathing or other concerns.  

## 2021-03-22 NOTE — ED Provider Notes (Signed)
-----------------------------------------   12:49 AM on 03/22/2021 -----------------------------------------   Patient resting in no acute distress, feeling significantly better.  No signs of angioedema.  Will discharge home on prednisone and Pepcid, and patient will follow up with his doctor closely.  Strict return precautions given.  Patient and spouse verbalized understanding and agree with plan of care.   Irean Hong, MD 03/22/21 [phone]

## 2021-03-23 ENCOUNTER — Encounter: Payer: Self-pay | Admitting: Family Medicine

## 2021-03-23 ENCOUNTER — Ambulatory Visit (INDEPENDENT_AMBULATORY_CARE_PROVIDER_SITE_OTHER): Payer: Medicare Other | Admitting: Family Medicine

## 2021-03-23 ENCOUNTER — Other Ambulatory Visit: Payer: Self-pay

## 2021-03-23 VITALS — BP 150/74 | HR 72 | Temp 98.2°F | Ht 68.0 in | Wt 200.0 lb

## 2021-03-23 DIAGNOSIS — T7840XA Allergy, unspecified, initial encounter: Secondary | ICD-10-CM

## 2021-03-23 DIAGNOSIS — T7840XS Allergy, unspecified, sequela: Secondary | ICD-10-CM

## 2021-03-23 MED ORDER — ONETOUCH ULTRASOFT LANCETS MISC: 12 refills | Status: AC

## 2021-03-23 NOTE — Progress Notes (Signed)
This visit occurred during the SARS-CoV-2 public health emergency.  Safety protocols were in place, including screening questions prior to the visit, additional usage of staff PPE, and extensive cleaning of exam room while observing appropriate contact time as indicated for disinfecting solutions.  ER f/u.  Higher sugar, up to 286 this AM on prednisone.  That was atypical for patient, meaning his sugar had been under better control prior to starting prednisone.  Ate at [facility] and then had allergic reaction.  Mouth was numb, couldn't feel his tongue, then had numbness in the throat.  Itching from the waist up, had a rash on the torso.  Quick onset.  Nauseated.  Didn't vomit.  No sx like this prev.    He had spicy seasoning on the chicken, that was atypical.  He had no trouble with chicken o/w.  No issues with red meat or fish.   He went to the ER.  Rationale for steroids and antihistamine use discussed with patient.  He improved and was able to be discharged home. EpiPen cautions d/w pt.    No lip mouth or tongue changes now.  No rash.  Not SOB.  He feels back to baseline.  No wheeze.    See AVS.    Meds, vitals, and allergies reviewed.   ROS: Per HPI unless specifically indicated in ROS section   GEN: nad, alert and oriented HEENT: ncat, MMM, OP wnl, no lip or tongue swelling. NECK: supple w/o LA, no stridor CV: rrr.  PULM: ctab, no inc wob, no wheeze ABD: soft, +bs EXT: no edema SKIN: Well-perfused.  32 minutes were devoted to patient care in this encounter (this includes time spent reviewing the patient's file/history, interviewing and examining the patient, counseling/reviewing plan with patient).

## 2021-03-23 NOTE — Patient Instructions (Addendum)
I wouldn't eat any similar spices or eat at [employer] again.  I think we should get you set up with the allergy clinic.  They should call you about that.  Finish the prednisone (with food) and your sugar should gradually get better. Take care.  Glad to see you.

## 2021-03-24 DIAGNOSIS — T7840XA Allergy, unspecified, initial encounter: Secondary | ICD-10-CM | POA: Insufficient documentation

## 2021-03-24 NOTE — Assessment & Plan Note (Signed)
Anaphylaxis pathophysiology and histamine reaction discussed with patient.  EpiPen cautions given to patient.  See after visit summary.  Advised to avoid [employer] or similar seasoning as that apparently was the most likely issue.  Refer to allergy clinic.  Finish prednisone course.  Steroid cautions discussed with patient.  He will update me as needed.  He feels back to baseline otherwise and is okay for outpatient follow-up.

## 2021-04-11 ENCOUNTER — Other Ambulatory Visit: Payer: Self-pay | Admitting: Physician Assistant

## 2021-04-11 ENCOUNTER — Other Ambulatory Visit: Payer: Self-pay | Admitting: Cardiovascular Disease

## 2021-04-15 ENCOUNTER — Telehealth: Payer: Self-pay

## 2021-04-15 NOTE — Progress Notes (Addendum)
Chronic Care Management Pharmacy Assistant   Name: DONN ZANETTI  MRN: [id number] DOB: 12/05/1945  Reason for Encounter: Diabetes Disease State   Recent office visits:  03/23/2021 - Elsie Stain, MD - Patient presented for follow-up for allergic reaction. No medication changes. Referral to Allergy.  02/16/2021 - Elsie Stain, MD - Patient presented for medicare wellness. No medication changes.  02/09/2021 Andrez Grime, LPN - Patient presented for Annual Wellness Visit. No medication changes.   Recent consult visits:  02/25/2021 - [facility] - Patient presented for ankle brachial indices. No other information.  02/22/2021 - Eulogio Ditch, NP - Patient presented for CT Angio Neck w CM & OR WO CM.  02/05/2021 - [facility] - Patient presented for bilateral carotid artery stenosis.   Hospital visits:  Medication Reconciliation was completed by comparing discharge summary, patient's EMR and Pharmacy list, and upon discussion with patient.  Admitted to the hospital on 03/21/2021 due to Anaphylaxis. Discharge date was 03/22/2021. Discharged from [place]?Medications Started at [facility] Discharge:?? -started predniSONE (DELTASONE) 50 MG tablet due to Anaphylaxis.  -started famotidine (PEPCID) 20 MG tablet due to Anaphylaxis.  -started EPINEPHrine 0.3 mg/0.3 mL IJ SOAJ injection due to Anaphylaxis.   Medication Changes at Hospital Discharge: -No medication changes.   Medications Discontinued at Hospital Discharge: -No medications discontinued.   Medications that remain the same after Hospital Discharge:??  -All other medications will remain the same.    Medications: Outpatient Encounter Medications as of 04/15/2021  Medication Sig   acetaminophen (TYLENOL) 325 MG tablet Take 1-2 tablets (325-650 mg total) by mouth every 4 (four) hours as needed for mild pain (or temp >/= 101 F).   albuterol (PROAIR  HFA) 108 (90 BASE) MCG/ACT inhaler Inhale 2 puffs into the lungs 3 (three) times daily as needed (cough).   amLODipine (NORVASC) 5 MG tablet TAKE 1 TABLET(5 MG) BY MOUTH DAILY   aspirin 81 MG EC tablet Take 1 tablet (81 mg total) by mouth daily.   atorvastatin (LIPITOR) 40 MG tablet TAKE 1 TABLET(40 MG) BY MOUTH DAILY   blood glucose meter kit and supplies KIT 1 each by Does not apply route daily as needed. Dispense based on patient and insurance preference. Use up to four times daily as directed. (FOR ICD-9 250.00, 250.01).   BRILINTA 60 MG TABS tablet TAKE 1 TABLET BY MOUTH TWICE DAILY   EPINEPHrine 0.3 mg/0.3 mL IJ SOAJ injection SMARTSIG:0.3 Milliliter(s) IM Once PRN   famotidine (PEPCID) 20 MG tablet Take 1 tablet (20 mg total) by mouth 2 (two) times daily.   fenofibrate (TRICOR) 145 MG tablet TAKE 1 TABLET(145 MG) BY MOUTH DAILY   Fluticasone-Salmeterol (ADVAIR DISKUS) 250-50 MCG/DOSE AEPB Inhale 1 puff into the lungs 2 (two) times daily.   glucosamine-chondroitin 500-400 MG tablet Take 1 tablet by mouth 2 (two) times daily.   glucose blood (ONETOUCH ULTRA) test strip CHECK SUGAR TWICE DAILY   insulin glargine (LANTUS SOLOSTAR) 100 UNIT/ML Solostar Pen ADMINISTER 90-100 UNITS UNDER THE SKIN DAILY   Insulin Pen Needle (B-D UF III MINI PEN NEEDLES) 31G X 5 MM MISC Use as instructed to inject insulin.  Diagnosis:  E11.9  Insulin dependent.   Lancets (ONETOUCH ULTRASOFT) lancets Use as instructed to check sugar, dx E11.9.   lisinopril (ZESTRIL) 40 MG tablet TAKE 1 TABLET BY MOUTH EVERY DAY   meclizine (ANTIVERT) 25 MG tablet Take 25 mg by mouth every 6 (  six) hours as needed. For dizziness/vertigo   metFORMIN (GLUCOPHAGE) 500 MG tablet Take 1 tablet (500 mg total) by mouth 2 (two) times daily with a meal.   metoprolol tartrate (LOPRESSOR) 25 MG tablet TAKE 1/2 TABLET BY MOUTH TWICE DAILY   Multiple Vitamins-Minerals (PRESERVISION AREDS 2 PO) Take 1 tablet by mouth 2 (two) times daily.    nitroGLYCERIN (NITROSTAT) 0.4 MG SL tablet Place 1 tablet (0.4 mg total) under the tongue every 5 (five) minutes as needed. For chest pain   predniSONE (DELTASONE) 50 MG tablet 1 tablet daily until finished   No facility-administered encounter medications on file as of 04/15/2021.     Recent Relevant Labs: Lab Results  Component Value Date/Time   HGBA1C 9.0 (H) 02/10/2021 07:40 AM   HGBA1C 8.9 (A) 11/12/2020 09:12 AM   HGBA1C 9.0 (A) 08/14/2020 08:28 AM   HGBA1C 8.2 (H) 01/17/2020 08:17 AM   HGBA1C 7.1 (H) 10/25/2014 03:02 AM    Kidney Function Lab Results  Component Value Date/Time   CREATININE 1.20 02/10/2021 07:40 AM   CREATININE 1.24 01/17/2020 08:17 AM   CREATININE 1.32 (H) 10/29/2014 09:08 AM   CREATININE 1.40 (H) 10/28/2014 02:45 AM   CREATININE 0.96 01/28/2011 08:55 AM   CREATININE 1.16 01/07/2011 08:35 AM   GFR 59.03 (L) 02/10/2021 07:40 AM   GFRNONAA 49 (L) 11/22/2019 09:52 AM   GFRNONAA 55 (L) 10/29/2014 09:08 AM   GFRAA 56 (L) 11/22/2019 09:52 AM   GFRAA >60 10/29/2014 09:08 AM   Contacted patient on 04/23/2021 to discuss diabetes disease state.   Current antihyperglycemic regimen:  Lantus 100unit/ml  - Inject 90 units daily (at bedtime) Metformin 574m  -1 tablet 2 times daily (1 at morning, 1 at bedtime)   Patient verbally confirms he is taking the above medications as directed. Yes  What diet changes have been made to improve diabetes control? Patient cut out potatoes.   What recent interventions/DTPs have been made to improve glycemic control:  None  Have there been any recent hospitalizations or ED visits since last visit with CPP? Yes  Patient denies hypoglycemic symptoms, including Pale, Sweaty, Shaky, Hungry, Nervous/irritable, and Vision changes  Patient denies hyperglycemic symptoms, including blurry vision, excessive thirst, fatigue, polyuria, and weakness  How often are you checking your blood sugar? once daily and in the morning before eating  or drinking  What are your blood sugars ranging? Patient did not have his log with him as he was at work. Patient states his blood sugar has good days and bad days. One day last week it was 108. On 10/13 it was 210 and on 1014 it was 255. Patient states there was no reason for his sugar to be that high the past two days, but it will do that sometimes. Patient ate soup and a sandwich for dinner the night before. Patient has a DM appointment with Dr. DDamita Dunningsnext month.   During the week, how often does your blood glucose drop below 70? Never  Are you checking your feet daily/regularly? Yes  Adherence Review: Is the patient currently on a STATIN medication? Yes Is the patient currently on ACE/ARB medication? Yes Does the patient have >5 day gap between last estimated fill dates? Yes  Care Gaps: Annual wellness visit in last year? Yes 02/09/2021 Most recent A1C reading: 9.0 on 02/10/2021 Most Recent BP reading: 150/74 on 03/23/2021  Last eye exam / retinopathy screening: 08/11/2020 Last diabetic foot exam: 02/16/2021  Counseled patient on importance of annual eye  and foot exam.   Star Rating Drugs:  Medication:  Last Fill: Day Supply Metformin 525m 03/29/2021 90 Atorvastatin 459m08/10/2020 90 Lisinopril 4015m0/09/2020 90  Patient upcoming appointment 04/27/2021 [facility]  MicDebbora DusPP notified  AmyMarijean NiemannMAYucca Valleysistant [phone] have reviewed the care management and care coordination activities outlined in this encounter and I am certifying that I agree with the content of this note. Patient's A1c fructosamine equivalent was 6.7% on last check. Would need more home BG readings to justify medication changes.  MicDebbora DusharmD Clinical Pharmacist [phone]

## 2021-04-20 DIAGNOSIS — H353123 Nonexudative age-related macular degeneration, left eye, advanced atrophic without subfoveal involvement: Secondary | ICD-10-CM | POA: Diagnosis not present

## 2021-04-23 ENCOUNTER — Telehealth: Payer: Self-pay | Admitting: Family Medicine

## 2021-04-23 MED ORDER — FLUTICASONE-SALMETEROL 250-50 MCG/ACT IN AEPB: 1.0000 | INHALATION_SPRAY | Freq: Two times a day (BID) | RESPIRATORY_TRACT | 2 refills | Status: DC

## 2021-04-23 NOTE — Telephone Encounter (Signed)
  Encourage patient to contact the pharmacy for refills or they can request refills through [facility]  LAST APPOINTMENT DATE:  Please schedule appointment if longer than 1 year  NEXT APPOINTMENT DATE:05/27/21  MEDICATION:wixela  Is the patient out of medication? yes  PHARMACY:[facility] [address] in [place]  Let patient know to contact pharmacy at the end of the day to make sure medication is ready.  Please notify patient to allow 48-72 hours to process  CLINICAL FILLS OUT ALL BELOW:   LAST REFILL:  QTY:  REFILL DATE:    OTHER COMMENTS:    Okay for refill?  Please advise

## 2021-04-23 NOTE — Telephone Encounter (Signed)
Rx sent 

## 2021-04-27 ENCOUNTER — Ambulatory Visit: Payer: Medicare Other | Admitting: Cardiovascular Disease

## 2021-04-27 ENCOUNTER — Encounter: Payer: Self-pay | Admitting: Cardiovascular Disease

## 2021-04-27 ENCOUNTER — Other Ambulatory Visit: Payer: Self-pay

## 2021-04-27 VITALS — BP 136/68 | HR 60 | Ht 68.0 in | Wt 202.2 lb

## 2021-04-27 DIAGNOSIS — I714 Abdominal aortic aneurysm, without rupture, unspecified: Secondary | ICD-10-CM

## 2021-04-27 DIAGNOSIS — E1151 Type 2 diabetes mellitus with diabetic peripheral angiopathy without gangrene: Secondary | ICD-10-CM

## 2021-04-27 DIAGNOSIS — I724 Aneurysm of artery of lower extremity: Secondary | ICD-10-CM

## 2021-04-27 DIAGNOSIS — I25118 Atherosclerotic heart disease of native coronary artery with other forms of angina pectoris: Secondary | ICD-10-CM | POA: Diagnosis not present

## 2021-04-27 DIAGNOSIS — I739 Peripheral vascular disease, unspecified: Secondary | ICD-10-CM | POA: Diagnosis not present

## 2021-04-27 DIAGNOSIS — I6522 Occlusion and stenosis of left carotid artery: Secondary | ICD-10-CM

## 2021-04-27 DIAGNOSIS — I10 Essential (primary) hypertension: Secondary | ICD-10-CM

## 2021-04-27 DIAGNOSIS — E785 Hyperlipidemia, unspecified: Secondary | ICD-10-CM

## 2021-04-27 MED ORDER — NITROGLYCERIN 0.4 MG SL SUBL: 0.4000 mg | SUBLINGUAL_TABLET | SUBLINGUAL | 2 refills | Status: AC | PRN

## 2021-04-27 MED ORDER — TICAGRELOR 60 MG PO TABS
60.0000 mg | ORAL_TABLET | Freq: Two times a day (BID) | ORAL | 3 refills | Status: DC
Start: 2021-04-27 — End: 2021-05-06

## 2021-04-27 MED ORDER — FENOFIBRATE 145 MG PO TABS: ORAL_TABLET | ORAL | 3 refills | Status: DC

## 2021-04-27 MED ORDER — METOPROLOL TARTRATE 25 MG PO TABS: 12.5000 mg | ORAL_TABLET | Freq: Two times a day (BID) | ORAL | 3 refills | Status: DC

## 2021-04-27 MED ORDER — AMLODIPINE BESYLATE 5 MG PO TABS: ORAL_TABLET | ORAL | 3 refills | Status: DC

## 2021-04-27 MED ORDER — ATORVASTATIN CALCIUM 40 MG PO TABS: ORAL_TABLET | ORAL | 3 refills | Status: DC

## 2021-04-27 MED ORDER — LISINOPRIL 40 MG PO TABS: 40.0000 mg | ORAL_TABLET | Freq: Every day | ORAL | 3 refills | Status: DC

## 2021-04-27 NOTE — Progress Notes (Signed)
Date:  04/27/2021   ID:  [name], DOB 1946-02-01, MRN [id number]  Patient Location:  [address] [place] [address]   Provider location:   [facility], [place] office  PCP:  Tonia Ghent, MD  Cardiologist:  Arvid Right [facility]  Chief Complaint  Patient presents with   12 month follow up     "Doing well." Medications reviewed by the patient verbally.      History of Present Illness:    [name] is a 75 y.o. male past medical history of coronary artery disease,  diabetes,  PAD,  prior stent to the LAD and RCA,  left femoropopliteal bypass with repair of aneurysm,  long history of smoking for 50 years,  quit (rare smoking) non-STEMI 10/24/2014  anginal equivalent, scapular pain between his shoulder blades. Moderate carotid disease on the left Prior hx of renal failure 2020, normal normalized CR 1.2 who presents for routine follow-up of his coronary artery disease.   In follow-up today reports he feels well Continues to have difficulty controlling his diabetes A1C 9.0, working with PMD "Whole family with diabetes"  Previously with  ice cream every night,  poor diet in general, weigh running high  Imaging: Reviewed on today's visit Normal ABIs Left Carotid:  Carotid CEA,09/2018 Velocities in the left ICA are consistent with a 40-59%  stenosis. Post op narrowing again seen in the mid to distal ICA with improved flow.  Labs reviewed Total chol 99 CR 1.2  No angina,  No chest pain, SOB  Rare inhaler use for COPD  EKG personally reviewed by myself on todays visit NSR rate 60 bpm no ST ro t wave changes  Other past medical history   presented to the hospital April 15 with pain similar to his previous anginal equivalent, scapular pain between his shoulder blades. He had cardiac catheterization April 18 that showed chronically occluded mid LAD, severe disease of the distal RCA as well as PDA branch. He had DES to the distal  RCA. In the holding area, he developed chest discomfort and was taken back to the cardiac catheterization lab that showed occluded PDA lesion which was stented. Discharged home on 11/01/2014    Past Medical History:  Diagnosis Date   CAD (coronary artery disease)    a. 1999: PCI-->LAD 2/2 MI; b. 2005: inf MI s/p PCI/DES x 4 to RCA; c. Myoview in 12/2005: EF 61%, no evidence for ischemia; d. cath 10/2014: occluded mLAD w/ L to L and L to R collats, dRCA 95% s/p PCI/DES 0%, mPDA 70%, LCx mild to mod irregs, procedure complicated by inf ST ele in recovery, repeat cath showed acute dRCA stent thrombosis o/w occluded mRPDA, PTCA dRCA, PCI/DES RPDA, aggrastat x 18 hr   CKD (chronic kidney disease)    COPD (chronic obstructive pulmonary disease) (HCC)    DM2 (diabetes mellitus, type 2) ([place])    H/O hiatal hernia    Heart attack ([place]) [id number]   had 2 in one day(released plaque during angio). 5 stents total   HTN (hypertension)    Hypercholesterolemia    Impaired fasting glucose    Elevated after steroid injection   Macular degeneration of right eye 2020   receiving injections into eye   Osteoarthritis of hip    injections in both hips   Osteoarthritis, knee    PAD (peripheral artery disease) (HCC)    L fem to below the knee popliteal vein bypass graft  Peri-rectal abscess 12/27/2011   Tobacco abuse    Prior   Past Surgical History:  Procedure Laterality Date   [place], 2006, 2016 x 2   Multiple, LAD in 1999, RCA in 2005.  has a total of 4 stents   ENDARTERECTOMY Left 09/21/2018   Procedure: ENDARTERECTOMY CAROTID;  Surgeon: Katha Cabal, MD;  Location: [facility];  Service: Vascular;  Laterality: Left;   FEMORAL-POPLITEAL BYPASS GRAFT  2016   L fem to below the knee popliteal vein bypass graft   INCISE AND DRAIN ABCESS  2007   on scrotum   INCISION AND DRAINAGE PERIRECTAL ABSCESS  12/28/2011   Procedure: [place];  Surgeon: Luella Cook III, MD;  Location: [place];  Service: General;  Laterality: N/A;    Allergies:   Augmentin [amoxicillin-pot clavulanate], Morphine and related, and Metformin and related   Social History   Tobacco Use   Smoking status: Former    Packs/day: 1.00    Years: 40.00    Pack years: 40.00    Types: Cigarettes    Quit date: 10/21/1998    Years since quitting: 22.5   Smokeless tobacco: Never   Tobacco comments:    Past smoker, states he will smoke maybe 1 cigarette/ month or so still  Vaping Use   Vaping Use: Never used  Substance Use Topics   Alcohol use: Not Currently    Alcohol/week: 0.0 standard drinks    Comment: Occasional beer on the weekends   Drug use: No     Current Outpatient Medications on File Prior to Visit  Medication Sig Dispense Refill   acetaminophen (TYLENOL) 325 MG tablet Take 1-2 tablets (325-650 mg total) by mouth every 4 (four) hours as needed for mild pain (or temp >/= 101 F). 100 tablet 0   albuterol (PROAIR HFA) 108 (90 BASE) MCG/ACT inhaler Inhale 2 puffs into the lungs 3 (three) times daily as needed (cough). 18 g 2   amLODipine (NORVASC) 5 MG tablet TAKE 1 TABLET(5 MG) BY MOUTH DAILY 90 tablet 0   aspirin 81 MG EC tablet Take 1 tablet (81 mg total) by mouth daily. 30 tablet 0   atorvastatin (LIPITOR) 40 MG tablet TAKE 1 TABLET(40 MG) BY MOUTH DAILY 90 tablet 3   blood glucose meter kit and supplies KIT 1 each by Does not apply route daily as needed. Dispense based on patient and insurance preference. Use up to four times daily as directed. (FOR ICD-9 250.00, 250.01). 1 each 0   BRILINTA 60 MG TABS tablet TAKE 1 TABLET BY MOUTH TWICE DAILY 180 tablet 0   EPINEPHrine 0.3 mg/0.3 mL IJ SOAJ injection SMARTSIG:0.3 Milliliter(s) IM Once PRN     famotidine (PEPCID) 20 MG tablet Take 1 tablet (20 mg total) by mouth 2 (two) times daily. 8 tablet 0   fenofibrate (TRICOR) 145 MG tablet TAKE 1 TABLET(145 MG) BY MOUTH  DAILY 30 tablet 11   fluticasone-salmeterol (ADVAIR DISKUS) 250-50 MCG/ACT AEPB Inhale 1 puff into the lungs in the morning and at bedtime. 1 each 2   glucosamine-chondroitin 500-400 MG tablet Take 1 tablet by mouth 2 (two) times daily.     glucose blood (ONETOUCH ULTRA) test strip CHECK SUGAR TWICE DAILY 100 strip 4   insulin glargine (LANTUS SOLOSTAR) 100 UNIT/ML Solostar Pen ADMINISTER 90-100 UNITS UNDER THE SKIN DAILY 90 mL 4   Insulin Pen Needle (B-D UF III MINI PEN  NEEDLES) 31G X 5 MM MISC Use as instructed to inject insulin.  Diagnosis:  E11.9  Insulin dependent. 100 each 3   Lancets (ONETOUCH ULTRASOFT) lancets Use as instructed to check sugar, dx E11.9. 100 each 12   lisinopril (ZESTRIL) 40 MG tablet TAKE 1 TABLET BY MOUTH EVERY DAY 90 tablet 0   meclizine (ANTIVERT) 25 MG tablet Take 25 mg by mouth every 6 (six) hours as needed. For dizziness/vertigo     metFORMIN (GLUCOPHAGE) 500 MG tablet Take 1 tablet (500 mg total) by mouth 2 (two) times daily with a meal.     metoprolol tartrate (LOPRESSOR) 25 MG tablet TAKE 1/2 TABLET BY MOUTH TWICE DAILY 90 tablet 0   Multiple Vitamins-Minerals (PRESERVISION AREDS 2 PO) Take 1 tablet by mouth 2 (two) times daily.     nitroGLYCERIN (NITROSTAT) 0.4 MG SL tablet Place 1 tablet (0.4 mg total) under the tongue every 5 (five) minutes as needed. For chest pain 25 tablet 12   predniSONE (DELTASONE) 50 MG tablet 1 tablet daily until finished (Patient not taking: Reported on 04/27/2021) 4 tablet 0   No current facility-administered medications on file prior to visit.     Family Hx: The patient's family history includes CAD in his mother; Diabetes in his mother; Heart failure in his father. There is no history of Colon cancer or Prostate cancer.  ROS:   Please see the history of present illness.    Review of Systems  Constitutional: Negative.   HENT: Negative.    Respiratory: Negative.    Cardiovascular: Negative.   Gastrointestinal: Negative.    Musculoskeletal:  Positive for joint pain.  Neurological: Negative.   Psychiatric/Behavioral: Negative.    All other systems reviewed and are negative.   Labs/Other Tests and Data Reviewed:    Recent Labs: 02/10/2021: ALT 13; BUN 15; Creatinine, Ser 1.20; Hemoglobin 14.6; Platelets 163.0; Potassium 3.9; Sodium 141   Recent Lipid Panel Lab Results  Component Value Date/Time   CHOL 99 02/10/2021 07:40 AM   CHOL 135 10/25/2014 03:02 AM   TRIG 283.0 (H) 02/10/2021 07:40 AM   TRIG 434 (H) 10/25/2014 03:02 AM   HDL 22.90 (L) 02/10/2021 07:40 AM   HDL 21 (L) 10/25/2014 03:02 AM   CHOLHDL 4 02/10/2021 07:40 AM   LDLCALC UNABLE TO CALCULATE IF TRIGLYCERIDE OVER 400 mg/dL 09/03/2016 05:36 PM   LDLCALC SEE COMMENT 10/25/2014 03:02 AM   LDLDIRECT 39.0 02/10/2021 07:40 AM    Wt Readings from Last 3 Encounters:  04/27/21 202 lb 4 oz (91.7 kg)  03/23/21 200 lb (90.7 kg)  03/21/21 200 lb (90.7 kg)     Exam:    BP 136/68 (BP Location: Left Arm, Patient Position: Sitting, Cuff Size: Normal)   Pulse 60   Ht _0  (1.727 m)   Wt 202 lb 4 oz (91.7 kg)   SpO2 98%   BMI 30.75 kg/m  Constitutional:  oriented to person, place, and time. No distress.  HENT:  Head: Grossly normal Eyes:  no discharge. No scleral icterus.  Neck: No JVD, no carotid bruits  Cardiovascular: Regular rate and rhythm, no murmurs appreciated Pulmonary/Chest: Clear to auscultation bilaterally, no wheezes or rails Abdominal: Soft.  no distension.  no tenderness.  Musculoskeletal: Normal range of motion Neurological:  normal muscle tone. Coordination normal. No atrophy Skin: Skin warm and dry Psychiatric: normal affect, pleasant  ASSESSMENT & PLAN:    Problem List Items Addressed This Visit       Cardiology Problems  Essential hypertension   DM (diabetes mellitus), type 2 with peripheral vascular complications (HCC)   PAD (peripheral artery disease) (HCC)   Relevant Orders   EKG 12-Lead   Carotid artery  disease ([place])   CAD (coronary artery disease), native coronary artery - Primary   Relevant Orders   EKG 12-Lead   AAA (abdominal aortic aneurysm) without rupture   Popliteal artery aneurysm ([place])   Other Visit Diagnoses     Hyperlipidemia LDL goal <70         PAD Followed by vascular Carotid endarterectomy March 2020,  Stressed importance of aggressive diabetes control  Diabetes type 2 with complications Hemoglobin A1c typically 8 or higher We have encouraged continued exercise, careful diet management in an effort to lose weight. Consider follow-up with endocrine specialist given chronicity of poorly controlled diabetes  CAD with stable angina Currently with no symptoms of angina. No further workup at this time. Continue current medication regimen.  Hyperlipidemia Cholesterol is at goal on the current lipid regimen. No changes to the medications were made.  Hypertension Blood pressure is well controlled on today's visit. No changes made to the medications.  Acute renal failure Elevated following carotid endarterectomy March 2020,  Back to normal now   Total encounter time more than 25 minutes  Greater than 50% was spent in counseling and coordination of care with the patient    Signed, Ida Rogue, [address], [place], [place] [address]

## 2021-04-27 NOTE — Patient Instructions (Addendum)
Medication Instructions:  No changes  If you need a refill on your cardiac medications before your next appointment, please call your pharmacy.   Lab work: No new labs needed  Testing/Procedures: No new testing needed  Follow-Up: At [facility], you and your health needs are our priority.  As part of our continuing mission to provide you with exceptional heart care, we have created designated Provider Care Teams.  These Care Teams include your primary Cardiologist (physician) and Advanced Practice Providers (APPs -  Physician Assistants and Nurse Practitioners) who all work together to provide you with the care you need, when you need it.  You will need a follow up appointment in 12 months  Providers on your designated Care Team:   Christopher Berge, NP Ryan Dunn, PA-C Jacquelyn Visser, PA-C Cadence Furth, PA-C  COVID-19 Vaccine Information can be found at: [email or web address] For questions related to vaccine distribution or appointments, please email [email or web address] or call [phone].    

## 2021-05-03 ENCOUNTER — Telehealth: Payer: Self-pay | Admitting: Family Medicine

## 2021-05-03 NOTE — Telephone Encounter (Signed)
I spoke with pt; pt will call insurance co to see what med would be comparable and more affordable for pt; pt will call back with info after talking with ins co. Sending note to [facility] CMA as FYI.

## 2021-05-03 NOTE — Telephone Encounter (Signed)
Pt called stating that medication albuterol (PROAIR HFA) 108 (90 BASE) MCG/ACT inhaler, insurance pays nothing on it. Pt states that the Advar inhaler  was going to be 93.00 dollars. Pt wanted to know if they had a generic brand.

## 2021-05-05 ENCOUNTER — Telehealth: Payer: Self-pay

## 2021-05-05 ENCOUNTER — Other Ambulatory Visit: Payer: Self-pay

## 2021-05-05 ENCOUNTER — Observation Stay
Admission: EM | Admit: 2021-05-05 | Discharge: 2021-05-06 | Disposition: A | Discharge status: Home / Self Care | Payer: Medicare Other | Attending: Emergency Medicine | Admitting: Emergency Medicine

## 2021-05-05 DIAGNOSIS — E1122 Type 2 diabetes mellitus with diabetic chronic kidney disease: Secondary | ICD-10-CM | POA: Diagnosis not present

## 2021-05-05 DIAGNOSIS — J449 Chronic obstructive pulmonary disease, unspecified: Secondary | ICD-10-CM | POA: Diagnosis not present

## 2021-05-05 DIAGNOSIS — E1169 Type 2 diabetes mellitus with other specified complication: Secondary | ICD-10-CM

## 2021-05-05 DIAGNOSIS — F17200 Nicotine dependence, unspecified, uncomplicated: Secondary | ICD-10-CM | POA: Diagnosis present

## 2021-05-05 DIAGNOSIS — Z20822 Contact with and (suspected) exposure to covid-19: Secondary | ICD-10-CM | POA: Diagnosis not present

## 2021-05-05 DIAGNOSIS — K625 Hemorrhage of anus and rectum: Principal | ICD-10-CM | POA: Insufficient documentation

## 2021-05-05 DIAGNOSIS — Z87891 Personal history of nicotine dependence: Secondary | ICD-10-CM | POA: Insufficient documentation

## 2021-05-05 DIAGNOSIS — Z79899 Other long term (current) drug therapy: Secondary | ICD-10-CM | POA: Insufficient documentation

## 2021-05-05 DIAGNOSIS — Z7982 Long term (current) use of aspirin: Secondary | ICD-10-CM | POA: Diagnosis not present

## 2021-05-05 DIAGNOSIS — I129 Hypertensive chronic kidney disease with stage 1 through stage 4 chronic kidney disease, or unspecified chronic kidney disease: Secondary | ICD-10-CM | POA: Diagnosis not present

## 2021-05-05 DIAGNOSIS — I10 Essential (primary) hypertension: Secondary | ICD-10-CM | POA: Diagnosis present

## 2021-05-05 DIAGNOSIS — K922 Gastrointestinal hemorrhage, unspecified: Secondary | ICD-10-CM

## 2021-05-05 DIAGNOSIS — Z794 Long term (current) use of insulin: Secondary | ICD-10-CM | POA: Diagnosis not present

## 2021-05-05 DIAGNOSIS — N189 Chronic kidney disease, unspecified: Secondary | ICD-10-CM | POA: Insufficient documentation

## 2021-05-05 DIAGNOSIS — I739 Peripheral vascular disease, unspecified: Secondary | ICD-10-CM

## 2021-05-05 DIAGNOSIS — Z7984 Long term (current) use of oral hypoglycemic drugs: Secondary | ICD-10-CM | POA: Diagnosis not present

## 2021-05-05 DIAGNOSIS — I70219 Atherosclerosis of native arteries of extremities with intermittent claudication, unspecified extremity: Secondary | ICD-10-CM

## 2021-05-05 DIAGNOSIS — I2510 Atherosclerotic heart disease of native coronary artery without angina pectoris: Secondary | ICD-10-CM | POA: Insufficient documentation

## 2021-05-05 DIAGNOSIS — I779 Disorder of arteries and arterioles, unspecified: Secondary | ICD-10-CM | POA: Diagnosis present

## 2021-05-05 LAB — CBC
HCT: 45.1 % (ref 39.0–52.0)
Hemoglobin: 15.6 g/dL (ref 13.0–17.0)
MCH: 31.8 pg (ref 26.0–34.0)
MCHC: 34.6 g/dL (ref 30.0–36.0)
MCV: 91.9 fL (ref 80.0–100.0)
Platelets: 206 10*3/uL (ref 150–400)
RBC: 4.91 MIL/uL (ref 4.22–5.81)
RDW: 13.8 % (ref 11.5–15.5)
WBC: 7.8 10*3/uL (ref 4.0–10.5)
nRBC: 0 % (ref 0.0–0.2)

## 2021-05-05 LAB — PROTIME-INR
INR: 1 (ref 0.8–1.2)
Prothrombin Time: 13.3 seconds (ref 11.4–15.2)

## 2021-05-05 LAB — COMPREHENSIVE METABOLIC PANEL
ALT: 17 U/L (ref 0–44)
AST: 19 U/L (ref 15–41)
Albumin: 4.2 g/dL (ref 3.5–5.0)
Alkaline Phosphatase: 45 U/L (ref 38–126)
Anion gap: 7 (ref 5–15)
BUN: 17 mg/dL (ref 8–23)
CO2: 28 mmol/L (ref 22–32)
Calcium: 9.6 mg/dL (ref 8.9–10.3)
Chloride: 105 mmol/L (ref 98–111)
Creatinine, Ser: 1.08 mg/dL (ref 0.61–1.24)
GFR, Estimated: >60 mL/min (ref >60)
Glucose, Bld: 160 mg/dL — ABNORMAL HIGH (ref 70–99)
Potassium: 4.2 mmol/L (ref 3.5–5.1)
Sodium: 140 mmol/L (ref 135–145)
Total Bilirubin: 0.8 mg/dL (ref 0.3–1.2)
Total Protein: 7.1 g/dL (ref 6.5–8.1)

## 2021-05-05 LAB — HEMOGLOBIN AND HEMATOCRIT, BLOOD
HCT: 41.2 % (ref 39.0–52.0)
Hemoglobin: 14 g/dL (ref 13.0–17.0)

## 2021-05-05 LAB — TYPE AND SCREEN
ABO/RH(D): A POS
Antibody Screen: NEGATIVE

## 2021-05-05 LAB — RESP PANEL BY RT-PCR (FLU A&B, COVID) ARPGX2
Influenza A by PCR: NEGATIVE
Influenza B by PCR: NEGATIVE
SARS Coronavirus 2 by RT PCR: NEGATIVE

## 2021-05-05 MED ORDER — NITROGLYCERIN 0.4 MG SL SUBL: 0.4000 mg | SUBLINGUAL_TABLET | SUBLINGUAL | Status: DC | PRN

## 2021-05-05 MED ORDER — ONDANSETRON HCL 4 MG PO TABS: 4.0000 mg | ORAL_TABLET | Freq: Four times a day (QID) | ORAL | Status: DC | PRN

## 2021-05-05 MED ORDER — ALBUTEROL SULFATE HFA 108 (90 BASE) MCG/ACT IN AERS: 2.0000 | INHALATION_SPRAY | Freq: Three times a day (TID) | RESPIRATORY_TRACT | Status: DC | PRN

## 2021-05-05 MED ORDER — ONDANSETRON HCL 4 MG/2ML IJ SOLN: 4.0000 mg | Freq: Four times a day (QID) | INTRAMUSCULAR | Status: DC | PRN

## 2021-05-05 MED ORDER — INSULIN ASPART 100 UNIT/ML IJ SOLN
0.0000 [IU] | Freq: Three times a day (TID) | INTRAMUSCULAR | Status: DC
  Administered 2021-05-06: 5 [IU] via SUBCUTANEOUS
  Administered 2021-05-06: 2 [IU] via SUBCUTANEOUS
  Filled 2021-05-05: qty 1

## 2021-05-05 MED ORDER — SODIUM CHLORIDE 0.9 % IV SOLN: INTRAVENOUS | Status: DC

## 2021-05-05 MED ORDER — MOMETASONE FURO-FORMOTEROL FUM 200-5 MCG/ACT IN AERO
2.0000 | INHALATION_SPRAY | Freq: Two times a day (BID) | RESPIRATORY_TRACT | Status: DC
  Administered 2021-05-05 – 2021-05-06 (×2): 2 via RESPIRATORY_TRACT
  Filled 2021-05-05: qty 8.8

## 2021-05-05 MED ORDER — ALBUTEROL SULFATE (2.5 MG/3ML) 0.083% IN NEBU: 2.5000 mg | INHALATION_SOLUTION | Freq: Four times a day (QID) | RESPIRATORY_TRACT | Status: DC | PRN

## 2021-05-05 MED ORDER — PANTOPRAZOLE SODIUM 40 MG IV SOLR
40.0000 mg | Freq: Once | INTRAVENOUS | Status: AC
  Administered 2021-05-05: 40 mg via INTRAVENOUS
  Filled 2021-05-05: qty 40

## 2021-05-05 MED ORDER — ACETAMINOPHEN 325 MG PO TABS
650.0000 mg | ORAL_TABLET | Freq: Once | ORAL | Status: AC
  Administered 2021-05-05: 650 mg via ORAL
  Filled 2021-05-05: qty 2

## 2021-05-05 MED ORDER — ATORVASTATIN CALCIUM 20 MG PO TABS
40.0000 mg | ORAL_TABLET | Freq: Every day | ORAL | Status: DC
  Administered 2021-05-06: 40 mg via ORAL
  Filled 2021-05-05: qty 2

## 2021-05-05 MED ORDER — METOPROLOL TARTRATE 25 MG PO TABS
12.5000 mg | ORAL_TABLET | Freq: Two times a day (BID) | ORAL | Status: DC
  Administered 2021-05-05 – 2021-05-06 (×2): 12.5 mg via ORAL
  Filled 2021-05-05 (×2): qty 1

## 2021-05-05 NOTE — Telephone Encounter (Signed)
Noted. Thanks.  I'll await the ER notes.

## 2021-05-05 NOTE — Consult Note (Signed)
Consultation  Referring Provider:     Dr Tamala Julian Admit date 10/26 Consult date        10/26 Reason for Consultation:     rectal bleeding         HPI:   [name] is a 75 y.o. male  with medical history of CAD on dual antiplatelet therapy with Brilinta and ASA, CKD, COPD, hiatal hernia, HTN, HDL, PAD, presented to ED today with painless rectal bleeding- states he was in his usual state of health- went to the bathroom to defecate, passed some stool and a mild to mod amt bright red blood, then had 2 further episodes of large amt of rectal bleeding with maroon stool and bright red blood. Has a 2 further episodes of lighter and less red material, then just passed gas. Denies any abdominal pain and all other GI concerns Note his is dually anticoagulated with ASA and brillinta; no recent cardiac or other stents. Follows with Dr Rockey Situ Note hgb and bun normal. Denies any family history of colorectal cancer/colon polyps  PREVIOUS ENDOSCOPIES:            [place]onoscopy 2012- [place]- states no polyps removed Colonoscopy 2007- [place]- unable to remember results Colonsocopy 2002- unable to remember results   Past Medical History:  Diagnosis Date   CAD (coronary artery disease)    a. 1999: PCI-->LAD 2/2 MI; b. 2005: inf MI s/p PCI/DES x 4 to RCA; c. Myoview in 12/2005: EF 61%, no evidence for ischemia; d. cath 10/2014: occluded mLAD w/ L to L and L to R collats, dRCA 95% s/p PCI/DES 0%, mPDA 70%, LCx mild to mod irregs, procedure complicated by inf ST ele in recovery, repeat cath showed acute dRCA stent thrombosis o/w occluded mRPDA, PTCA dRCA, PCI/DES RPDA, aggrastat x 18 hr   CKD (chronic kidney disease)    COPD (chronic obstructive pulmonary disease) (HCC)    DM2 (diabetes mellitus, type 2) ([place])    H/O hiatal hernia    Heart attack ([place]) [phone]   had 2 in one day(released plaque during angio). 5 stents total   HTN (hypertension)    Hypercholesterolemia    Impaired fasting  glucose    Elevated after steroid injection   Macular degeneration of right eye 2020   receiving injections into eye   Osteoarthritis of hip    injections in both hips   Osteoarthritis, knee    PAD (peripheral artery disease) (HCC)    L fem to below the knee popliteal vein bypass graft   Peri-rectal abscess 12/27/2011   Tobacco abuse    Prior    Past Surgical History:  Procedure Laterality Date   [place], 2006, 2016 x 2   Multiple, LAD in 1999, RCA in 2005.  has a total of 4 stents   ENDARTERECTOMY Left 09/21/2018   Procedure: ENDARTERECTOMY CAROTID;  Surgeon: Katha Cabal, MD;  Location: [facility] ORS;  Service: Vascular;  Laterality: Left;   FEMORAL-POPLITEAL BYPASS GRAFT  2016   L fem to below the knee popliteal vein bypass graft   INCISE AND DRAIN ABCESS  2007   on scrotum   INCISION AND DRAINAGE PERIRECTAL ABSCESS  12/28/2011   Procedure: [place];  Surgeon: Merrie Roof, MD;  Location: [facility] OR;  Service: General;  Laterality: N/A;    Family History  Problem Relation Age of Onset   Heart failure Father  CHF   CAD Mother    Diabetes Mother    Colon cancer Neg Hx    Prostate cancer Neg Hx      Social History   Tobacco Use   Smoking status: Former    Packs/day: 1.00    Years: 40.00    Pack years: 40.00    Types: Cigarettes    Quit date: 10/21/1998    Years since quitting: 22.5   Smokeless tobacco: Never   Tobacco comments:    Past smoker, states he will smoke maybe 1 cigarette/ month or so still  Vaping Use   Vaping Use: Never used  Substance Use Topics   Alcohol use: Not Currently    Alcohol/week: 0.0 standard drinks    Comment: Occasional beer on the weekends   Drug use: No    Prior to Admission medications   Medication Sig Start Date End Date Taking? Authorizing Provider  acetaminophen (TYLENOL) 325 MG tablet Take 1-2 tablets (325-650 mg total) by mouth every 4  (four) hours as needed for mild pain (or temp >/= 101 F). 09/22/18   Juanna Cao, MD  albuterol (PROAIR HFA) 108 (90 BASE) MCG/ACT inhaler Inhale 2 puffs into the lungs 3 (three) times daily as needed (cough). 04/17/13   Tonia Ghent, MD  amLODipine (NORVASC) 5 MG tablet TAKE 1 TABLET(5 MG) BY MOUTH DAILY 04/27/21   Minna Merritts, MD  aspirin 81 MG EC tablet Take 1 tablet (81 mg total) by mouth daily. 11/19/14   Minna Merritts, MD  atorvastatin (LIPITOR) 40 MG tablet TAKE 1 TABLET(40 MG) BY MOUTH DAILY 04/27/21   Minna Merritts, MD  blood glucose meter kit and supplies KIT 1 each by Does not apply route daily as needed. Dispense based on patient and insurance preference. Use up to four times daily as directed. (FOR ICD-9 250.00, 250.01). 01/12/18   Tonia Ghent, MD  EPINEPHrine 0.3 mg/0.3 mL IJ SOAJ injection SMARTSIG:0.3 Milliliter(s) IM Once PRN 03/22/21   [provider]  famotidine (PEPCID) 20 MG tablet Take 1 tablet (20 mg total) by mouth 2 (two) times daily. 03/22/21   Paulette Blanch, MD  fenofibrate (TRICOR) 145 MG tablet TAKE 1 TABLET(145 MG) BY MOUTH DAILY 04/27/21   Minna Merritts, MD  fluticasone-salmeterol (ADVAIR DISKUS) 250-50 MCG/ACT AEPB Inhale 1 puff into the lungs in the morning and at bedtime. 04/23/21   Tonia Ghent, MD  glucosamine-chondroitin 500-400 MG tablet Take 1 tablet by mouth 2 (two) times daily.    [provider]  glucose blood (ONETOUCH ULTRA) test strip CHECK SUGAR TWICE DAILY 01/12/21   Tonia Ghent, MD  insulin glargine (LANTUS SOLOSTAR) 100 UNIT/ML Solostar Pen ADMINISTER 90-100 UNITS UNDER THE SKIN DAILY 11/12/20   Tonia Ghent, MD  Insulin Pen Needle (B-D UF III MINI PEN NEEDLES) 31G X 5 MM MISC Use as instructed to inject insulin.  Diagnosis:  E11.9  Insulin dependent. 02/16/21   Tonia Ghent, MD  Lancets [facility] ULTRASOFT) lancets Use as instructed to check sugar, dx E11.9. 03/23/21   Tonia Ghent, MD  lisinopril  (ZESTRIL) 40 MG tablet Take 1 tablet (40 mg total) by mouth daily. 04/27/21   Minna Merritts, MD  meclizine (ANTIVERT) 25 MG tablet Take 25 mg by mouth every 6 (six) hours as needed. For dizziness/vertigo    [provider]  metFORMIN (GLUCOPHAGE) 500 MG tablet Take 1 tablet (500 mg total) by mouth 2 (two) times daily  with a meal. 02/17/21   Tonia Ghent, MD  metoprolol tartrate (LOPRESSOR) 25 MG tablet Take 0.5 tablets (12.5 mg total) by mouth 2 (two) times daily. 04/27/21   Minna Merritts, MD  Multiple Vitamins-Minerals (PRESERVISION AREDS 2 PO) Take 1 tablet by mouth 2 (two) times daily.    [provider]  nitroGLYCERIN (NITROSTAT) 0.4 MG SL tablet Place 1 tablet (0.4 mg total) under the tongue every 5 (five) minutes as needed. For chest pain 04/27/21   Minna Merritts, MD  predniSONE (DELTASONE) 50 MG tablet 1 tablet daily until finished Patient not taking: Reported on 04/27/2021 03/22/21   Paulette Blanch, MD  ticagrelor (BRILINTA) 60 MG TABS tablet Take 1 tablet (60 mg total) by mouth 2 (two) times daily. 04/27/21   Minna Merritts, MD    No current facility-administered medications for this encounter.   Current Outpatient Medications  Medication Sig Dispense Refill   acetaminophen (TYLENOL) 325 MG tablet Take 1-2 tablets (325-650 mg total) by mouth every 4 (four) hours as needed for mild pain (or temp >/= 101 F). 100 tablet 0   albuterol (PROAIR HFA) 108 (90 BASE) MCG/ACT inhaler Inhale 2 puffs into the lungs 3 (three) times daily as needed (cough). 18 g 2   amLODipine (NORVASC) 5 MG tablet TAKE 1 TABLET(5 MG) BY MOUTH DAILY 90 tablet 3   aspirin 81 MG EC tablet Take 1 tablet (81 mg total) by mouth daily. 30 tablet 0   atorvastatin (LIPITOR) 40 MG tablet TAKE 1 TABLET(40 MG) BY MOUTH DAILY 90 tablet 3   blood glucose meter kit and supplies KIT 1 each by Does not apply route daily as needed. Dispense based on patient and insurance preference. Use up to four times  daily as directed. (FOR ICD-9 250.00, 250.01). 1 each 0   EPINEPHrine 0.3 mg/0.3 mL IJ SOAJ injection SMARTSIG:0.3 Milliliter(s) IM Once PRN     famotidine (PEPCID) 20 MG tablet Take 1 tablet (20 mg total) by mouth 2 (two) times daily. 8 tablet 0   fenofibrate (TRICOR) 145 MG tablet TAKE 1 TABLET(145 MG) BY MOUTH DAILY 90 tablet 3   fluticasone-salmeterol (ADVAIR DISKUS) 250-50 MCG/ACT AEPB Inhale 1 puff into the lungs in the morning and at bedtime. 1 each 2   glucosamine-chondroitin 500-400 MG tablet Take 1 tablet by mouth 2 (two) times daily.     glucose blood (ONETOUCH ULTRA) test strip CHECK SUGAR TWICE DAILY 100 strip 4   insulin glargine (LANTUS SOLOSTAR) 100 UNIT/ML Solostar Pen ADMINISTER 90-100 UNITS UNDER THE SKIN DAILY 90 mL 4   Insulin Pen Needle (B-D UF III MINI PEN NEEDLES) 31G X 5 MM MISC Use as instructed to inject insulin.  Diagnosis:  E11.9  Insulin dependent. 100 each 3   Lancets (ONETOUCH ULTRASOFT) lancets Use as instructed to check sugar, dx E11.9. 100 each 12   lisinopril (ZESTRIL) 40 MG tablet Take 1 tablet (40 mg total) by mouth daily. 90 tablet 3   meclizine (ANTIVERT) 25 MG tablet Take 25 mg by mouth every 6 (six) hours as needed. For dizziness/vertigo     metFORMIN (GLUCOPHAGE) 500 MG tablet Take 1 tablet (500 mg total) by mouth 2 (two) times daily with a meal.     metoprolol tartrate (LOPRESSOR) 25 MG tablet Take 0.5 tablets (12.5 mg total) by mouth 2 (two) times daily. 90 tablet 3   Multiple Vitamins-Minerals (PRESERVISION AREDS 2 PO) Take 1 tablet by mouth 2 (two) times daily.  nitroGLYCERIN (NITROSTAT) 0.4 MG SL tablet Place 1 tablet (0.4 mg total) under the tongue every 5 (five) minutes as needed. For chest pain 25 tablet 2   predniSONE (DELTASONE) 50 MG tablet 1 tablet daily until finished (Patient not taking: Reported on 04/27/2021) 4 tablet 0   ticagrelor (BRILINTA) 60 MG TABS tablet Take 1 tablet (60 mg total) by mouth 2 (two) times daily. 180 tablet 3     Allergies as of 05/05/2021 - Review Complete 05/05/2021  Allergen Reaction Noted   Augmentin [amoxicillin-pot clavulanate] Other (See Comments) 12/27/2011   Morphine and related Nausea And Vomiting 10/20/2014   Metformin and related Other (See Comments) 06/08/2015     Review of Systems:    All systems reviewed and negative except where noted in HPI with the exception of occasional DOE.    Physical Exam:  Vital signs in last 24 hours: Temp:  [98.3 F (36.8 C)] 98.3 F (36.8 C) (10/26 1348) Pulse Rate:  [61] 61 (10/26 1348) Resp:  [18] 18 (10/26 1348) BP: (125)/(78) 125/78 (10/26 1348) SpO2:  [95 %] 95 % (10/26 1348) Weight:  [90.7 kg] 90.7 kg (10/26 1350)   General:   Pleasant man in NAD Head:  Normocephalic and atraumatic. Eyes:   No icterus.   Conjunctiva pink. Ears:  Normal auditory acuity. Mouth: Mucosa pink moist, no lesions. Neck:  Supple; no masses felt Lungs:  Respirations even and unlabored. Lungs clear to auscultation bilaterally.   No wheezes, crackles, or rhonchi.  Heart:  S1S2, RRR, no MRG. No edema. Abdomen:   Flat, soft, nondistended, nontender. Normal bowel sounds. No appreciable masses or hepatomegaly. No rebound signs or other peritoneal signs. Rectal:  Not performed.  Msk:  MAEW x4, No clubbing or cyanosis. Strength 5/5. Symmetrical without gross deformities. Neurologic:  Alert and  oriented x4;  Cranial nerves II-XII intact.  Skin:  Warm, dry, pink without significant lesions or rashes. Psych:  Alert and cooperative. Normal affect.  LAB RESULTS: Recent Labs    05/05/21 1351  WBC 7.8  HGB 15.6  HCT 45.1  PLT 206   BMET Recent Labs    05/05/21 1351  NA 140  K 4.2  CL 105  CO2 28  GLUCOSE 160*  BUN 17  CREATININE 1.08  CALCIUM 9.6   LFT Recent Labs    05/05/21 1351  PROT 7.1  ALBUMIN 4.2  AST 19  ALT 17  ALKPHOS 45  BILITOT 0.8   PT/INR Recent Labs    05/05/21 1351  LABPROT 13.3  INR 1.0    STUDIES: No results  found.     Impression / Plan:   Rectal bleeding- ddx includes diverticular, other. Recommending colonoscopy as clinically feasible- may need to be as o/p due to chronic dual anticoagulation. Note his [place] score is 11; agree with overnight observation and following hemoglobin/transfusion prn for hgb < 8 or if he develops sympomatic anemia.  Thank you very much for this consult. These services were provided by Stephens November, NP-C, in collaboration with Lesly Rubenstein MD, with whom I have discussed this patient in full.   Stephens November, NP-C

## 2021-05-05 NOTE — Telephone Encounter (Signed)
Per chart review tab pt is already at [facility] ED. Sending note to Dr Para March and Shanda Bumps CMA.

## 2021-05-05 NOTE — ED Provider Notes (Signed)
HPI: Pt is a 75 y.o. male who presents with complaints of rectal bleeding  The patient p/w  rectal bleeding starting today, BRB, on brilinta- starting to slow down now.   ROS: Denies fever, chest pain, vomiting  Past Medical History:  Diagnosis Date   CAD (coronary artery disease)    a. 1999: PCI-->LAD 2/2 MI; b. 2005: inf MI s/p PCI/DES x 4 to RCA; c. Myoview in 12/2005: EF 61%, no evidence for ischemia; d. cath 10/2014: occluded mLAD w/ L to L and L to R collats, dRCA 95% s/p PCI/DES 0%, mPDA 70%, LCx mild to mod irregs, procedure complicated by inf ST ele in recovery, repeat cath showed acute dRCA stent thrombosis o/w occluded mRPDA, PTCA dRCA, PCI/DES RPDA, aggrastat x 18 hr   CKD (chronic kidney disease)    COPD (chronic obstructive pulmonary disease) (HCC)    DM2 (diabetes mellitus, type 2) (HCC)    H/O hiatal hernia    Heart attack (HCC) [phone]   had 2 in one day(released plaque during angio). 5 stents total   HTN (hypertension)    Hypercholesterolemia    Impaired fasting glucose    Elevated after steroid injection   Macular degeneration of right eye 2020   receiving injections into eye   Osteoarthritis of hip    injections in both hips   Osteoarthritis, knee    PAD (peripheral artery disease) (HCC)    L fem to below the knee popliteal vein bypass graft   Peri-rectal abscess 12/27/2011   Tobacco abuse    Prior   Vitals:   05/05/21 1348  BP: 125/78  Pulse: 61  Resp: 18  Temp: 98.3 F (36.8 C)  SpO2: 95%    Focused Physical Exam: Gen: No acute distress Head: atraumatic, normocephalic Eyes: Extraocular movements grossly intact; conjunctiva clear CV: RRR Lung: No increased WOB, no stridor GI: ND, no obvious masses Neuro: Alert and awake  Medical Decision Making and Plan: Given the patient's initial medical screening exam, the following diagnostic evaluation has been ordered. The patient will be placed in the appropriate treatment space, once one is available,  to complete the evaluation and treatment. I have discussed the plan of care with the patient and I have advised the patient that an ED physician or mid-level practitioner will reevaluate their condition after the test results have been received, as the results may give them additional insight into the type of treatment they may need.   Diagnostics: labs   Treatments: none immediately   Concha Se, MD 05/05/21 1353

## 2021-05-05 NOTE — Telephone Encounter (Signed)
[facility] - Client TELEPHONE ADVICE RECORD AccessNurse Patient Name: [name] Gender: Male DOB: 1946/07/11 Age: 75 Y 71 D Return Phone Number: [phone] (Primary) Address: City/ State/ Zip: [place] [address] Client [facility] - Client Client Site [facility] - Day Physician [name] - MD Contact Type Call Who Is Calling Patient / Member / Family / Caregiver Call Type Triage / Clinical Relationship To Patient Self Return Phone Number [phone] (Primary) Chief Complaint Blood In Stool Reason for Call Symptomatic / Request for Health Information Initial Comment Caller states he is defecating blood. He is currently taking amoxicilin for a gum infection and Tylenol and ibuprofen for the pain. Translation No Nurse Assessment Nurse: Clarita Leber, RN, Deborah Date/Time (Eastern Time): 05/05/2021 12:11:29 PM Confirm and document reason for call. If symptomatic, describe symptoms. ---Caller states that he has had 3 stools that were bloody. The caller states that it has gotten to diarrhea. No abdominal pain. Is taking amoxicillin for dental infection and was taking ibuprofen (400 mg every 8 hours) and Tylenol. Does the patient have any new or worsening symptoms? ---Yes Will a triage be completed? ---Yes Related visit to physician within the last 2 weeks? ---No Does the PT have any chronic conditions? (i.e. diabetes, asthma, this includes High risk factors for pregnancy, etc.) ---Yes List chronic conditions. ---diabetes, hypertension, 5 stents Is this a behavioral health or substance abuse call? ---No Guidelines Guideline Title Affirmed Question Affirmed Notes Nurse Date/Time (Eastern Time) Rectal Bleeding Taking Coumadin (warfarin) or other strong blood thinner, or known bleeding disorder (e.g., thrombocytopenia) Womble, RN, Gavin Pound 05/05/2021 12:15:45 PM PLEASE NOTE: All  timestamps contained within this report are represented as [place] Standard Time. CONFIDENTIALTY NOTICE: This fax transmission is intended only for the addressee. It contains information that is legally privileged, confidential or otherwise protected from use or disclosure. If you are not the intended recipient, you are strictly prohibited from reviewing, disclosing, copying using or disseminating any of this information or taking any action in reliance on or regarding this information. If you have received this fax in error, please notify us immediately by telephone so that we can arrange for its return to [place]. Phone: [phone], Toll-Free: [phone], Fax: [phone] Page: 2 of 2 Call Id: [id number] Disp. Time [name] Time) Disposition Final User 05/05/2021 12:19:48 PM Go to ED Now (or PCP triage) Yes Clarita Leber, RN, Jetty Duhamel Disagree/Comply Comply Caller Understands Yes PreDisposition Go to ED Care Advice Given Per Guideline GO TO ED NOW (OR PCP TRIAGE): Referrals [facility] - E

## 2021-05-05 NOTE — ED Triage Notes (Signed)
Pt comes pov from home with rectal bleeding since this morning. States large amount of blood in toliet. Is on thinners. Denies belly pain.

## 2021-05-05 NOTE — H&P (Signed)
History and Physical    [name] JJK:[id number] DOB: 08-11-1945 DOA: 05/05/2021  PCP: Tonia Ghent, MD   Patient coming from: Home  I have personally briefly reviewed patient's old medical records in [place]  Chief Complaint: Rectal bleeding  HPI: [name] is a 75 y.o. male with medical history significant for coronary artery disease status post stent angioplasty, carotid artery disease status post carotid endarterectomy, peripheral arterial disease, diabetes mellitus, hypertension and nicotine dependence who presents to the emergency room for evaluation of several episodes of painless, rectal bleeding. Patient presents to the ER for evaluation of rectal bleeding which started on the day of his admission.  He felt the urge to use the bathroom and noticed blood in the commode when he finished.  He denies having any abdominal pain.  Patient states that he had several episodes about 4 to 5 episodes and that he had started lightening up by the time he came to the ER. He admits to recent NSAID use and had been taking ibuprofen every 6 hours for about a week for relief of a tooth ache. He denies feeling dizzy or lightheaded, no falls, no chest pain, no shortness of breath, no abdominal pain, no urinary symptoms, no fever, no chills, no cough, no headache, no blurred vision, no focal deficits, no palpitation or diaphoresis. Labs show sodium 140, potassium 4.2, chloride 105, bicarb 28, glucose 160, BUN 17, creatinine 1.08, calcium 9.6, alkaline phosphatase 45, albumin 4.2, AST 19, ALT 17, total protein 7.1, white count 7.8, hemoglobin 15.6, hematocrit 45.1, MCV 91.9, RDW 13.8, platelet count 206, PT 13.3, INR 1.0 Respiratory viral panel is pending   ED Course: Patient is a 75 year old Caucasian male who presents to the ER for evaluation of painless rectal bleeding. Patient is on aspirin and Brilinta for his known coronary artery disease, carotid disease as well as peripheral  vascular disease. His H&H is stable and he will be referred to observation status for further evaluation.  Review of Systems: As per HPI otherwise all other systems reviewed and negative.    Past Medical History:  Diagnosis Date   CAD (coronary artery disease)    a. 1999: PCI-->LAD 2/2 MI; b. 2005: inf MI s/p PCI/DES x 4 to RCA; c. Myoview in 12/2005: EF 61%, no evidence for ischemia; d. cath 10/2014: occluded mLAD w/ L to L and L to R collats, dRCA 95% s/p PCI/DES 0%, mPDA 70%, LCx mild to mod irregs, procedure complicated by inf ST ele in recovery, repeat cath showed acute dRCA stent thrombosis o/w occluded mRPDA, PTCA dRCA, PCI/DES RPDA, aggrastat x 18 hr   CKD (chronic kidney disease)    COPD (chronic obstructive pulmonary disease) (HCC)    DM2 (diabetes mellitus, type 2) ([place])    H/O hiatal hernia    Heart attack ([place]) [phone]   had 2 in one day(released plaque during angio). 5 stents total   HTN (hypertension)    Hypercholesterolemia    Impaired fasting glucose    Elevated after steroid injection   Macular degeneration of right eye 2020   receiving injections into eye   Osteoarthritis of hip    injections in both hips   Osteoarthritis, knee    PAD (peripheral artery disease) (HCC)    L fem to below the knee popliteal vein bypass graft   Peri-rectal abscess 12/27/2011   Tobacco abuse    Prior    Past Surgical History:  Procedure Laterality Date   CORONARY ANGIOPLASTY  [place], 2006, 2016 x 2   Multiple, LAD in 1999, RCA in 2005.  has a total of 4 stents   ENDARTERECTOMY Left 09/21/2018   Procedure: ENDARTERECTOMY CAROTID;  Surgeon: Katha Cabal, MD;  Location: [facility];  Service: Vascular;  Laterality: Left;   FEMORAL-POPLITEAL BYPASS GRAFT  2016   L fem to below the knee popliteal vein bypass graft   INCISE AND DRAIN ABCESS  2007   on scrotum   INCISION AND DRAINAGE PERIRECTAL ABSCESS  12/28/2011   Procedure: [place];  Surgeon: Merrie Roof, MD;  Location: [facility];  Service: General;  Laterality: N/A;     reports that he quit smoking about 22 years ago. His smoking use included cigarettes. He has a 40.00 pack-year smoking history. He has never used smokeless tobacco. He reports that he does not currently use alcohol. He reports that he does not use drugs.  Allergies  Allergen Reactions   Augmentin [Amoxicillin-Pot Clavulanate] Other (See Comments)    Nausea,vomiting,diarrhea   Morphine And Related Nausea And Vomiting    Vomiting, GI upset   Metformin And Related Other (See Comments)    Intolerant of 2092m a day.     Family History  Problem Relation Age of Onset   Heart failure Father        CHF   CAD Mother    Diabetes Mother    Colon cancer Neg Hx    Prostate cancer Neg Hx       Prior to Admission medications   Medication Sig Start Date End Date Taking? Authorizing Provider  acetaminophen (TYLENOL) 325 MG tablet Take 1-2 tablets (325-650 mg total) by mouth every 4 (four) hours as needed for mild pain (or temp >/= 101 F). 09/22/18   CJuanna Cao MD  albuterol (PROAIR HFA) 108 (90 BASE) MCG/ACT inhaler Inhale 2 puffs into the lungs 3 (three) times daily as needed (cough). 04/17/13   DTonia Ghent MD  amLODipine (NORVASC) 5 MG tablet TAKE 1 TABLET(5 MG) BY MOUTH DAILY 04/27/21   GMinna Merritts MD  aspirin 81 MG EC tablet Take 1 tablet (81 mg total) by mouth daily. 11/19/14   GMinna Merritts MD  atorvastatin (LIPITOR) 40 MG tablet TAKE 1 TABLET(40 MG) BY MOUTH DAILY 04/27/21   GMinna Merritts MD  blood glucose meter kit and supplies KIT 1 each by Does not apply route daily as needed. Dispense based on patient and insurance preference. Use up to four times daily as directed. (FOR ICD-9 250.00, 250.01). 01/12/18   DTonia Ghent MD  EPINEPHrine 0.3 mg/0.3 mL IJ SOAJ injection SMARTSIG:0.3 Milliliter(s) IM Once PRN 03/22/21   [provider]   famotidine (PEPCID) 20 MG tablet Take 1 tablet (20 mg total) by mouth 2 (two) times daily. 03/22/21   SPaulette Blanch MD  fenofibrate (TRICOR) 145 MG tablet TAKE 1 TABLET(145 MG) BY MOUTH DAILY 04/27/21   GMinna Merritts MD  fluticasone-salmeterol (ADVAIR DISKUS) 250-50 MCG/ACT AEPB Inhale 1 puff into the lungs in the morning and at bedtime. 04/23/21   DTonia Ghent MD  glucosamine-chondroitin 500-400 MG tablet Take 1 tablet by mouth 2 (two) times daily.    [provider]  glucose blood (ONETOUCH ULTRA) test strip CHECK SUGAR TWICE DAILY 01/12/21   DTonia Ghent MD  insulin glargine (LANTUS SOLOSTAR) 100 UNIT/ML Solostar Pen ADMINISTER 90-100 UNITS UNDER THE SKIN DAILY 11/12/20   DDamita Dunnings  Elveria Rising, MD  Insulin Pen Needle (B-D UF III MINI PEN NEEDLES) 31G X 5 MM MISC Use as instructed to inject insulin.  Diagnosis:  E11.9  Insulin dependent. 02/16/21   Tonia Ghent, MD  Lancets [facility] ULTRASOFT) lancets Use as instructed to check sugar, dx E11.9. 03/23/21   Tonia Ghent, MD  lisinopril (ZESTRIL) 40 MG tablet Take 1 tablet (40 mg total) by mouth daily. 04/27/21   Minna Merritts, MD  meclizine (ANTIVERT) 25 MG tablet Take 25 mg by mouth every 6 (six) hours as needed. For dizziness/vertigo    [provider]  metFORMIN (GLUCOPHAGE) 500 MG tablet Take 1 tablet (500 mg total) by mouth 2 (two) times daily with a meal. 02/17/21   Tonia Ghent, MD  metoprolol tartrate (LOPRESSOR) 25 MG tablet Take 0.5 tablets (12.5 mg total) by mouth 2 (two) times daily. 04/27/21   Minna Merritts, MD  Multiple Vitamins-Minerals (PRESERVISION AREDS 2 PO) Take 1 tablet by mouth 2 (two) times daily.    [provider]  nitroGLYCERIN (NITROSTAT) 0.4 MG SL tablet Place 1 tablet (0.4 mg total) under the tongue every 5 (five) minutes as needed. For chest pain 04/27/21   Minna Merritts, MD  predniSONE (DELTASONE) 50 MG tablet 1 tablet daily until finished Patient not taking:  Reported on 04/27/2021 03/22/21   Paulette Blanch, MD  ticagrelor (BRILINTA) 60 MG TABS tablet Take 1 tablet (60 mg total) by mouth 2 (two) times daily. 04/27/21   Minna Merritts, MD    Physical Exam: Vitals:   05/05/21 1348 05/05/21 1350 05/05/21 1659  BP: 125/78  139/68  Pulse: 61  (!) 58  Resp: 18  17  Temp: 98.3 F (36.8 C)  97.8 F (36.6 C)  TempSrc: Oral  Oral  SpO2: 95%  97%  Weight:  90.7 kg   Height:  5' 8"  (1.727 m)      Vitals:   05/05/21 1348 05/05/21 1350 05/05/21 1659  BP: 125/78  139/68  Pulse: 61  (!) 58  Resp: 18  17  Temp: 98.3 F (36.8 C)  97.8 F (36.6 C)  TempSrc: Oral  Oral  SpO2: 95%  97%  Weight:  90.7 kg   Height:  5' 8"  (1.727 m)       Constitutional: Alert and oriented x 3 . Not in any apparent distress HEENT:      Head: Normocephalic and atraumatic.         Eyes: PERLA, EOMI, Conjunctivae are normal. Sclera is non-icteric.       Mouth/Throat: Mucous membranes are moist.       Neck: Supple with no signs of meningismus. Cardiovascular: Regular rate and rhythm. No murmurs, gallops, or rubs. 2+ symmetrical distal pulses are present . No JVD. No LE edema Respiratory: Respiratory effort normal .Lungs sounds clear bilaterally. No wheezes, crackles, or rhonchi.  Gastrointestinal: Soft, non tender, and non distended with positive bowel sounds.  Genitourinary: No CVA tenderness. Musculoskeletal: Nontender with normal range of motion in all extremities. No cyanosis, or erythema of extremities. Neurologic:  Face is symmetric. Moving all extremities. No gross focal neurologic deficits . Skin: Skin is warm, dry.  No rash or ulcers Psychiatric: Mood and affect are normal    Labs on Admission: I have personally reviewed following labs and imaging studies  CBC: Recent Labs  Lab 05/05/21 1351  WBC 7.8  HGB 15.6  HCT 45.1  MCV 91.9  PLT 206   Basic  Metabolic Panel: Recent Labs  Lab 05/05/21 1351  NA 140  K 4.2  CL 105  CO2 28  GLUCOSE  160*  BUN 17  CREATININE 1.08  CALCIUM 9.6   GFR: Estimated Creatinine Clearance: 63.6 mL/min (by C-G formula based on SCr of 1.08 mg/dL). Liver Function Tests: Recent Labs  Lab 05/05/21 1351  AST 19  ALT 17  ALKPHOS 45  BILITOT 0.8  PROT 7.1  ALBUMIN 4.2   No results for input(s): LIPASE, AMYLASE in the last 168 hours. No results for input(s): AMMONIA in the last 168 hours. Coagulation Profile: Recent Labs  Lab 05/05/21 1351  INR 1.0   Cardiac Enzymes: No results for input(s): CKTOTAL, CKMB, CKMBINDEX, TROPONINI in the last 168 hours. BNP (last 3 results) No results for input(s): PROBNP in the last 8760 hours. HbA1C: No results for input(s): HGBA1C in the last 72 hours. CBG: No results for input(s): GLUCAP in the last 168 hours. Lipid Profile: No results for input(s): CHOL, HDL, LDLCALC, TRIG, CHOLHDL, LDLDIRECT in the last 72 hours. Thyroid Function Tests: No results for input(s): TSH, T4TOTAL, FREET4, T3FREE, THYROIDAB in the last 72 hours. Anemia Panel: No results for input(s): VITAMINB12, FOLATE, FERRITIN, TIBC, IRON, RETICCTPCT in the last 72 hours. Urine analysis:    Component Value Date/Time   COLORURINE YELLOW (A) 01/06/2018 1824   APPEARANCEUR HAZY (A) 01/06/2018 1824   APPEARANCEUR Clear 10/25/2014 0326   LABSPEC 1.025 01/06/2018 1824   LABSPEC 1.015 10/25/2014 0326   PHURINE 5.0 01/06/2018 1824   GLUCOSEU NEGATIVE 01/06/2018 1824   GLUCOSEU 50 mg/dL 10/25/2014 0326   HGBUR NEGATIVE 01/06/2018 1824   BILIRUBINUR Neg 06/24/2019 1222   BILIRUBINUR Negative 10/25/2014 0326   KETONESUR 5 (A) 01/06/2018 1824   PROTEINUR Negative 06/24/2019 1222   PROTEINUR 30 (A) 01/06/2018 1824   UROBILINOGEN 0.2 06/24/2019 1222   NITRITE Neg 06/24/2019 1222   NITRITE NEGATIVE 01/06/2018 1824   LEUKOCYTESUR Negative 06/24/2019 1222   LEUKOCYTESUR Negative 10/25/2014 0326    Radiological Exams on Admission: No results found.   Assessment/Plan Principal  Problem:   Rectal bleeding Active Problems:   Essential hypertension   DM (diabetes mellitus), type 2 with peripheral vascular complications (HCC)   Carotid artery disease (HCC)   CAD (coronary artery disease), native coronary artery   Nicotine dependence      Patient is a 75 year old male who presents to the ER for evaluation of rectal bleeding   Rectal bleeding Probably diverticular in nature Hold aspirin and Brilinta H&H is stable Will monitor serial H&H and transfuse as needed Consult gastroenterology Place patient on a clear liquid diet     History of coronary artery disease/peripheral vascular disease and carotid artery disease Hold aspirin and Brilinta Patient has not had any recent stent angioplasty at least in the last 6 months Continue metoprolol and atorvastatin    Nicotine dependence Smoking cessation has been discussed with patient in detail (about 5 minutes was spent) Patient states that he has cut back and declines a nicotine patch at this time    Diabetes mellitus Place patient on clear liquid diet Glycemic control with sliding scale insulin    DVT prophylaxis: SCD  Code Status: full code  Family Communication: Greater than 50% of time was spent discussing patient's condition and plan of care with him and his wife at the bedside.  All questions and concerns have been addressed.  They verbalized understanding and agree with the plan. Disposition Plan: Back to previous home environment  Consults called: Gastroenterology Status: Observation    Shareece Bultman MD [facility]     05/05/2021, 5:15 PM

## 2021-05-05 NOTE — ED Provider Notes (Addendum)
[facility] Emergency Department Provider Note  ____________________________________________   Event Date/Time   First MD Initiated Contact with Patient 05/05/21 1613     (approximate)  I have reviewed the triage vital signs and the nursing notes.   HISTORY  Chief Complaint Blood In Stools   HPI [name] is a 75 y.o. male with Paschal history of CAD on dual antiplatelet therapy with Brilinta and ASA, CKD, COPD, hiatal hernia, HTN, HDL, PAD and recent diagnosis of a tooth abscess about a week ago on amoxicillin and reportedly taking ibuprofen every 6 hours for about a week who presents for assessment of some painless rectal bleeding that started today.  Patient states had about 4 or 5 episodes.  States seeing the last episode seemed a little less than initially.  He denies any abdominal pain or back pain, burning with urination, blood in his urine, chest pain, cough, shortness of breath, headache, earache, sore throat, lightheadedness or any other acute sick symptoms.  Denies any recent EtOH use.  States it has been at least over a decade since his last colonoscopy.  No other acute concerns at this time.         Past Medical History:  Diagnosis Date   CAD (coronary artery disease)    a. 1999: PCI-->LAD 2/2 MI; b. 2005: inf MI s/p PCI/DES x 4 to RCA; c. Myoview in 12/2005: EF 61%, no evidence for ischemia; d. cath 10/2014: occluded mLAD w/ L to L and L to R collats, dRCA 95% s/p PCI/DES 0%, mPDA 70%, LCx mild to mod irregs, procedure complicated by inf ST ele in recovery, repeat cath showed acute dRCA stent thrombosis o/w occluded mRPDA, PTCA dRCA, PCI/DES RPDA, aggrastat x 18 hr   CKD (chronic kidney disease)    COPD (chronic obstructive pulmonary disease) (HCC)    DM2 (diabetes mellitus, type 2) ([place])    H/O hiatal hernia    Heart attack ([place]) [phone]   had 2 in one day(released plaque during angio). 5 stents total   HTN (hypertension)     Hypercholesterolemia    Impaired fasting glucose    Elevated after steroid injection   Macular degeneration of right eye 2020   receiving injections into eye   Osteoarthritis of hip    injections in both hips   Osteoarthritis, knee    PAD (peripheral artery disease) (HCC)    L fem to below the knee popliteal vein bypass graft   Peri-rectal abscess 12/27/2011   Tobacco abuse    Prior    Patient Active Problem List   Diagnosis Date Noted   Allergic reaction 03/24/2021   Health care maintenance 02/17/2021   Aortic ectasia, abdominal ([place]) 07/17/2019   Lower abdominal pain 06/27/2019   Carotid artery stenosis without cerebral infarction, left 09/21/2018   Abnormal dreams 08/05/2018   Creatinine elevation 08/05/2018   Dysuria 01/14/2018   CAD (coronary artery disease), native coronary artery 07/16/2017   Shoulder pain 12/30/2016   Skin lesion 12/30/2016   Unstable angina ([place]) 09/03/2016   AAA (abdominal aortic aneurysm) without rupture 07/13/2016   Popliteal artery aneurysm ([place]) 07/13/2016   Carotid artery disease ([place]) 01/30/2015   NSTEMI (non-ST elevated myocardial infarction) ([place]) 11/16/2014   Medicare annual wellness visit, initial 10/21/2014   Advance care planning 10/21/2014   PAD (peripheral artery disease) ([place]) 09/09/2014   COPD exacerbation ([place]) 08/21/2014   DM (diabetes mellitus), type 2 with peripheral vascular complications ([place]) 85/88/5027   Back pain 02/24/2013  Cough 01/26/2011   COPD (chronic obstructive pulmonary disease) ([place]) 12/26/2010   FATIGUE 05/11/2009   Essential hypertension 05/08/2009   Hyperlipidemia 06/20/2008    Past Surgical History:  Procedure Laterality Date   [place], 2006, 2016 x 2   Multiple, LAD in 1999, RCA in 2005.  has a total of 4 stents   ENDARTERECTOMY Left 09/21/2018   Procedure: ENDARTERECTOMY CAROTID;  Surgeon: Katha Cabal, MD;  Location: [facility] ORS;  Service:  Vascular;  Laterality: Left;   FEMORAL-POPLITEAL BYPASS GRAFT  2016   L fem to below the knee popliteal vein bypass graft   INCISE AND DRAIN ABCESS  2007   on scrotum   INCISION AND DRAINAGE PERIRECTAL ABSCESS  12/28/2011   Procedure: [place];  Surgeon: Merrie Roof, MD;  Location: [place];  Service: General;  Laterality: N/A;    Prior to Admission medications   Medication Sig Start Date End Date Taking? Authorizing Provider  acetaminophen (TYLENOL) 325 MG tablet Take 1-2 tablets (325-650 mg total) by mouth every 4 (four) hours as needed for mild pain (or temp >/= 101 F). 09/22/18   Juanna Cao, MD  albuterol (PROAIR HFA) 108 (90 BASE) MCG/ACT inhaler Inhale 2 puffs into the lungs 3 (three) times daily as needed (cough). 04/17/13   Tonia Ghent, MD  amLODipine (NORVASC) 5 MG tablet TAKE 1 TABLET(5 MG) BY MOUTH DAILY 04/27/21   Minna Merritts, MD  aspirin 81 MG EC tablet Take 1 tablet (81 mg total) by mouth daily. 11/19/14   Minna Merritts, MD  atorvastatin (LIPITOR) 40 MG tablet TAKE 1 TABLET(40 MG) BY MOUTH DAILY 04/27/21   Minna Merritts, MD  blood glucose meter kit and supplies KIT 1 each by Does not apply route daily as needed. Dispense based on patient and insurance preference. Use up to four times daily as directed. (FOR ICD-9 250.00, 250.01). 01/12/18   Tonia Ghent, MD  EPINEPHrine 0.3 mg/0.3 mL IJ SOAJ injection SMARTSIG:0.3 Milliliter(s) IM Once PRN 03/22/21   [provider]  famotidine (PEPCID) 20 MG tablet Take 1 tablet (20 mg total) by mouth 2 (two) times daily. 03/22/21   Paulette Blanch, MD  fenofibrate (TRICOR) 145 MG tablet TAKE 1 TABLET(145 MG) BY MOUTH DAILY 04/27/21   Minna Merritts, MD  fluticasone-salmeterol (ADVAIR DISKUS) 250-50 MCG/ACT AEPB Inhale 1 puff into the lungs in the morning and at bedtime. 04/23/21   Tonia Ghent, MD  glucosamine-chondroitin 500-400 MG tablet Take 1 tablet by mouth 2 (two) times  daily.    [provider]  glucose blood (ONETOUCH ULTRA) test strip CHECK SUGAR TWICE DAILY 01/12/21   Tonia Ghent, MD  insulin glargine (LANTUS SOLOSTAR) 100 UNIT/ML Solostar Pen ADMINISTER 90-100 UNITS UNDER THE SKIN DAILY 11/12/20   Tonia Ghent, MD  Insulin Pen Needle (B-D UF III MINI PEN NEEDLES) 31G X 5 MM MISC Use as instructed to inject insulin.  Diagnosis:  E11.9  Insulin dependent. 02/16/21   Tonia Ghent, MD  Lancets [facility] ULTRASOFT) lancets Use as instructed to check sugar, dx E11.9. 03/23/21   Tonia Ghent, MD  lisinopril (ZESTRIL) 40 MG tablet Take 1 tablet (40 mg total) by mouth daily. 04/27/21   Minna Merritts, MD  meclizine (ANTIVERT) 25 MG tablet Take 25 mg by mouth every 6 (six) hours as needed. For dizziness/vertigo    [provider]  metFORMIN (GLUCOPHAGE) 500 MG tablet Take 1 tablet (500 mg total) by mouth 2 (two) times daily with a meal. 02/17/21   Tonia Ghent, MD  metoprolol tartrate (LOPRESSOR) 25 MG tablet Take 0.5 tablets (12.5 mg total) by mouth 2 (two) times daily. 04/27/21   Minna Merritts, MD  Multiple Vitamins-Minerals (PRESERVISION AREDS 2 PO) Take 1 tablet by mouth 2 (two) times daily.    [provider]  nitroGLYCERIN (NITROSTAT) 0.4 MG SL tablet Place 1 tablet (0.4 mg total) under the tongue every 5 (five) minutes as needed. For chest pain 04/27/21   Minna Merritts, MD  predniSONE (DELTASONE) 50 MG tablet 1 tablet daily until finished Patient not taking: Reported on 04/27/2021 03/22/21   Paulette Blanch, MD  ticagrelor (BRILINTA) 60 MG TABS tablet Take 1 tablet (60 mg total) by mouth 2 (two) times daily. 04/27/21   Minna Merritts, MD    Allergies Augmentin [amoxicillin-pot clavulanate], Morphine and related, and Metformin and related  Family History  Problem Relation Age of Onset   Heart failure Father        CHF   CAD Mother    Diabetes Mother    Colon cancer Neg Hx    Prostate cancer Neg Hx      Social History Social History   Tobacco Use   Smoking status: Former    Packs/day: 1.00    Years: 40.00    Pack years: 40.00    Types: Cigarettes    Quit date: 10/21/1998    Years since quitting: 22.5   Smokeless tobacco: Never   Tobacco comments:    Past smoker, states he will smoke maybe 1 cigarette/ month or so still  Vaping Use   Vaping Use: Never used  Substance Use Topics   Alcohol use: Not Currently    Alcohol/week: 0.0 standard drinks    Comment: Occasional beer on the weekends   Drug use: No    Review of Systems  Review of Systems  Constitutional:  Negative for chills and fever.  HENT:  Negative for sore throat.   Eyes:  Negative for pain.  Respiratory:  Negative for cough and stridor.   Cardiovascular:  Negative for chest pain.  Gastrointestinal:  Positive for blood in stool. Negative for vomiting.  Genitourinary:  Negative for dysuria.  Musculoskeletal:  Negative for myalgias.  Skin:  Negative for rash.  Neurological:  Negative for seizures, loss of consciousness and headaches.  Psychiatric/Behavioral:  Negative for suicidal ideas.   All other systems reviewed and are negative.    ____________________________________________   PHYSICAL EXAM:  VITAL SIGNS: ED Triage Vitals  Enc Vitals Group     BP 05/05/21 1348 125/78     Pulse Rate 05/05/21 1348 61     Resp 05/05/21 1348 18     Temp 05/05/21 1348 98.3 F (36.8 C)     Temp Source 05/05/21 1348 Oral     SpO2 05/05/21 1348 95 %     Weight 05/05/21 1350 200 lb (90.7 kg)     Height 05/05/21 1350 _0  (1.727 m)     Head Circumference --      Peak Flow --      Pain Score 05/05/21 1350 0     Pain Loc --      Pain Edu? --      Excl. in [employer]? --    Vitals:   05/05/21 1348  BP: 125/78  Pulse: 61  Resp: 18  Temp: 98.3 F (36.8 C)  SpO2: 95%   Physical Exam Vitals and nursing note reviewed.  Constitutional:      Appearance: He is well-developed.  HENT:     Head: Normocephalic and  atraumatic.     Right Ear: External ear normal.     Left Ear: External ear normal.     Nose: Nose normal.  Eyes:     Conjunctiva/sclera: Conjunctivae normal.  Cardiovascular:     Rate and Rhythm: Normal rate and regular rhythm.     Heart sounds: No murmur heard. Pulmonary:     Effort: Pulmonary effort is normal. No respiratory distress.     Breath sounds: Normal breath sounds.  Abdominal:     Palpations: Abdomen is soft.     Tenderness: There is no abdominal tenderness.  Musculoskeletal:     Cervical back: Neck supple.  Skin:    General: Skin is warm and dry.     Capillary Refill: Capillary refill takes less than 2 seconds.  Neurological:     Mental Status: He is alert and oriented to person, place, and time.  Psychiatric:        Mood and Affect: Mood normal.    On rectal exam patient has Hemoccult positive her in stool.  No active bleeding on exam.  There also appears to be a small external hemorrhoid but no large fissures. ____________________________________________   LABS (all labs ordered are listed, but only abnormal results are displayed)  Labs Reviewed  COMPREHENSIVE METABOLIC PANEL - Abnormal; Notable for the following components:      Result Value   Glucose, Bld 160 (*)    All other components within normal limits  RESP PANEL BY RT-PCR (FLU A&B, COVID) [id number]  CBC  PROTIME-INR  POC OCCULT BLOOD, ED  TYPE AND SCREEN   ____________________________________________  EKG   ____________________________________________  RADIOLOGY  ED MD interpretation:    Official radiology report(s): No results found.  ____________________________________________   PROCEDURES  Procedure(s) performed (including Critical Care):  Procedures   ____________________________________________   INITIAL IMPRESSION / ASSESSMENT AND PLAN / ED COURSE     Patient presents with above-stated history exam for assessment of some painless rectal bleeding during today.  He has  had several episodes.  On arrival he is afebrile and hemodynamically stable.  He denies any associated pain and has no abdominal tenderness or back pain.  Rectal exam shows no active bleeding with some Hemoccult positive and colored stool.  Suspect likely lower GI bleed.  He has no history of lower GI bleeding while he is on NSAIDs there is no melanotic stools EtOH use or history of cirrhosis or gastritis or ulcers and I will order suspicion for upper GI bleed.  However will give a dose of Protonix.  Given he has no fever or pain of the lower suspicion for diverticulitis.  Hemoglobin is stable at 15.6.  There is no leukocytosis.  CMP shows no significant electrolyte or metabolic derangements.  Discussed with on-call gastroenterologist Dr. Haig Prophet who will see patient and I will admit to medicine service for further evaluation and management with concern for stable lower GI bleed.   ____________________________________________   FINAL CLINICAL IMPRESSION(S) / ED DIAGNOSES  Final diagnoses:  Gastrointestinal hemorrhage, unspecified gastrointestinal hemorrhage type    Medications  pantoprazole (PROTONIX) injection 40 mg (40 mg Intravenous Given 05/05/21 1634)     ED Discharge Orders     None        Note:  This document was  prepared using Systems analyst and may include unintentional dictation errors.    Lucrezia Starch, MD 05/05/21 1634    Lucrezia Starch, MD 05/05/21 [id number]

## 2021-05-06 DIAGNOSIS — I739 Peripheral vascular disease, unspecified: Secondary | ICD-10-CM | POA: Diagnosis not present

## 2021-05-06 DIAGNOSIS — I25118 Atherosclerotic heart disease of native coronary artery with other forms of angina pectoris: Secondary | ICD-10-CM | POA: Diagnosis not present

## 2021-05-06 DIAGNOSIS — E785 Hyperlipidemia, unspecified: Secondary | ICD-10-CM

## 2021-05-06 DIAGNOSIS — K922 Gastrointestinal hemorrhage, unspecified: Secondary | ICD-10-CM | POA: Diagnosis not present

## 2021-05-06 DIAGNOSIS — J449 Chronic obstructive pulmonary disease, unspecified: Secondary | ICD-10-CM

## 2021-05-06 DIAGNOSIS — Z720 Tobacco use: Secondary | ICD-10-CM

## 2021-05-06 DIAGNOSIS — E1169 Type 2 diabetes mellitus with other specified complication: Secondary | ICD-10-CM

## 2021-05-06 DIAGNOSIS — K625 Hemorrhage of anus and rectum: Secondary | ICD-10-CM | POA: Diagnosis not present

## 2021-05-06 LAB — CBG MONITORING, ED
Glucose-Capillary: 137 mg/dL — ABNORMAL HIGH (ref 70–99)
Glucose-Capillary: 246 mg/dL — ABNORMAL HIGH (ref 70–99)

## 2021-05-06 LAB — HEMOGLOBIN A1C
Hgb A1c MFr Bld: 9.3 % — ABNORMAL HIGH (ref 4.8–5.6)
Mean Plasma Glucose: 220.21 mg/dL

## 2021-05-06 LAB — HEMOGLOBIN: Hemoglobin: 14.3 g/dL (ref 13.0–17.0)

## 2021-05-06 NOTE — Discharge Summary (Signed)
[facility] [place] NAME: [name]    MR#:  [id number]  DATE OF BIRTH:  01/08/1946  DATE OF ADMISSION:  05/05/2021 ADMITTING PHYSICIAN: Collier Bullock, MD  DATE OF DISCHARGE: 05/06/2021  4:14 PM  PRIMARY CARE PHYSICIAN: Tonia Ghent, MD    ADMISSION DIAGNOSIS:  Rectal bleeding [K62.5]  DISCHARGE DIAGNOSIS:  Principal Problem:   Rectal bleeding Active Problems:   Essential hypertension   DM (diabetes mellitus), type 2 with peripheral vascular complications (HCC)   Carotid artery disease (HCC)   CAD (coronary artery disease), native coronary artery   Nicotine dependence   SECONDARY DIAGNOSIS:   Past Medical History:  Diagnosis Date   CAD (coronary artery disease)    a. 1999: PCI-->LAD 2/2 MI; b. 2005: inf MI s/p PCI/DES x 4 to RCA; c. Myoview in 12/2005: EF 61%, no evidence for ischemia; d. cath 10/2014: occluded mLAD w/ L to L and L to R collats, dRCA 95% s/p PCI/DES 0%, mPDA 70%, LCx mild to mod irregs, procedure complicated by inf ST ele in recovery, repeat cath showed acute dRCA stent thrombosis o/w occluded mRPDA, PTCA dRCA, PCI/DES RPDA, aggrastat x 18 hr   CKD (chronic kidney disease)    COPD (chronic obstructive pulmonary disease) ([place])    DM2 (diabetes mellitus, type 2) ([place])    H/O hiatal hernia    Heart attack ([place]) [phone]   had 2 in one day(released plaque during angio). 5 stents total   HTN (hypertension)    Hypercholesterolemia    Impaired fasting glucose    Elevated after steroid injection   Macular degeneration of right eye 2020   receiving injections into eye   Osteoarthritis of hip    injections in both hips   Osteoarthritis, knee    PAD (peripheral artery disease) (HCC)    L fem to below the knee popliteal vein bypass graft   Peri-rectal abscess 12/27/2011   Tobacco abuse    Prior    HOSPITAL COURSE:   1.  Lower GI bleed.  Could be diverticular in nature.  The patient had blood in  the bowel movements prior to coming into the hospital.  Patient was advanced on his diet here in the hospital.  He did have 1 bowel movement here prior to discharge that was normal brown without blood.  He was seen by gastroenterology Dr. Haig Prophet.  Follow-up as outpatient for potential colonoscopy.  I discussed with patient he can restart aspirin and a day or so.  The patient will speak with Dr. Rockey Situ about whether or not he can come off the Brilinta or go back on the Atlas.  Prior to colonoscopy will have to be off both agents for 5 days.  Hemoglobin stable here in the hospital. 2.  History of peripheral vascular disease and coronary artery disease.  We held aspirin and Brilinta.  Patient on metoprolol and atorvastatin. 3.  Type 2 diabetes mellitus with hyperlipidemia.  Continue atorvastatin.  Patient takes Lantus insulin 90 units daily. 4.  Tobacco abuse 5.  COPD.  Respiratory status stable  DISCHARGE CONDITIONS:  Satisfactory  CONSULTS OBTAINED:  Gastroenterology  DRUG ALLERGIES:   Allergies  Allergen Reactions   Augmentin [Amoxicillin-Pot Clavulanate] Other (See Comments)    Nausea,vomiting,diarrhea   Morphine And Related Nausea And Vomiting    Vomiting, GI upset   Metformin And Related Other (See Comments)    Intolerant of 2053m a day.     DISCHARGE MEDICATIONS:  Allergies as of 05/06/2021       Reactions   Augmentin [amoxicillin-pot Clavulanate] Other (See Comments)   Nausea,vomiting,diarrhea   Morphine And Related Nausea And Vomiting   Vomiting, GI upset   Metformin And Related Other (See Comments)   Intolerant of 2065m a day.         Medication List     STOP taking these medications    amLODipine 5 MG tablet Commonly known as: NORVASC   amoxicillin 500 MG capsule Commonly known as: AMOXIL   aspirin 81 MG EC tablet   predniSONE 50 MG tablet Commonly known as: DELTASONE   ticagrelor 60 MG Tabs tablet Commonly known as: Brilinta       TAKE  these medications    acetaminophen 325 MG tablet Commonly known as: TYLENOL Take 1-2 tablets (325-650 mg total) by mouth every 4 (four) hours as needed for mild pain (or temp >/= 101 F).   albuterol 108 (90 Base) MCG/ACT inhaler Commonly known as: ProAir HFA Inhale 2 puffs into the lungs 3 (three) times daily as needed (cough).   Antivert 25 MG tablet Generic drug: meclizine Take 25 mg by mouth every 6 (six) hours as needed. For dizziness/vertigo   atorvastatin 40 MG tablet Commonly known as: LIPITOR TAKE 1 TABLET(40 MG) BY MOUTH DAILY   B-D UF III MINI PEN NEEDLES 31G X 5 MM Misc Generic drug: Insulin Pen Needle Use as instructed to inject insulin.  Diagnosis:  E11.9  Insulin dependent.   blood glucose meter kit and supplies Kit 1 each by Does not apply route daily as needed. Dispense based on patient and insurance preference. Use up to four times daily as directed. (FOR ICD-9 250.00, 250.01).   EPINEPHrine 0.3 mg/0.3 mL Soaj injection Commonly known as: EPI-PEN SMARTSIG:0.3 Milliliter(s) IM Once PRN   famotidine 20 MG tablet Commonly known as: Pepcid Take 1 tablet (20 mg total) by mouth 2 (two) times daily.   fenofibrate 145 MG tablet Commonly known as: TRICOR TAKE 1 TABLET(145 MG) BY MOUTH DAILY   fluticasone-salmeterol 250-50 MCG/ACT Aepb Commonly known as: Advair Diskus Inhale 1 puff into the lungs in the morning and at bedtime.   glucosamine-chondroitin 500-400 MG tablet Take 1 tablet by mouth 2 (two) times daily.   Lantus SoloStar 100 UNIT/ML Solostar Pen Generic drug: insulin glargine ADMINISTER 90-100 UNITS UNDER THE SKIN DAILY   lisinopril 40 MG tablet Commonly known as: ZESTRIL Take 1 tablet (40 mg total) by mouth daily.   metFORMIN 500 MG tablet Commonly known as: GLUCOPHAGE Take 1 tablet (500 mg total) by mouth 2 (two) times daily with a meal.   metoprolol tartrate 25 MG tablet Commonly known as: LOPRESSOR Take 0.5 tablets (12.5 mg total) by  mouth 2 (two) times daily.   nitroGLYCERIN 0.4 MG SL tablet Commonly known as: NITROSTAT Place 1 tablet (0.4 mg total) under the tongue every 5 (five) minutes as needed. For chest pain   OneTouch Ultra test strip Generic drug: glucose blood CHECK SUGAR TWICE DAILY   onetouch ultrasoft lancets Use as instructed to check sugar, dx E11.9.   PRESERVISION AREDS 2 PO Take 1 tablet by mouth 2 (two) times daily.         DISCHARGE INSTRUCTIONS:  Follow-up PMD 5 days Follow-up gastroenterology 2-week  If you experience worsening of your admission symptoms, develop shortness of breath, life threatening emergency, suicidal or homicidal thoughts you must seek medical attention immediately by calling 911 or calling your MD immediately  if  symptoms less severe.  You Must read complete instructions/literature along with all the possible adverse reactions/side effects for all the Medicines you take and that have been prescribed to you. Take any new Medicines after you have completely understood and accept all the possible adverse reactions/side effects.   Please note  You were cared for by a hospitalist during your hospital stay. If you have any questions about your discharge medications or the care you received while you were in the hospital after you are discharged, you can call the unit and asked to speak with the hospitalist on call if the hospitalist that took care of you is not available. Once you are discharged, your primary care physician will handle any further medical issues. Please note that NO REFILLS for any discharge medications will be authorized once you are discharged, as it is imperative that you return to your primary care physician (or establish a relationship with a primary care physician if you do not have one) for your aftercare needs so that they can reassess your need for medications and monitor your lab values.    Today   CHIEF COMPLAINT:   Chief Complaint  Patient  presents with   Blood In Stools    HISTORY OF PRESENT ILLNESS:  [name]  is a 75 y.o. male admitted with lower GI bleed   VITAL SIGNS:  Blood pressure 124/68, pulse 78, temperature 98 F (36.7 C), temperature source Oral, resp. rate 16, height 5' 8"  (1.727 m), weight 90.7 kg, SpO2 98 %.  I/O:  No intake or output data in the 24 hours ending 05/06/21 1706  PHYSICAL EXAMINATION:  GENERAL:  75 y.o.-year-old patient lying in the bed with no acute distress.  EYES: Pupils equal, round, reactive to light and accommodation. No scleral icterus. Extraocular muscles intact.  HEENT: Head atraumatic, normocephalic. Oropharynx and nasopharynx clear.  LUNGS: Normal breath sounds bilaterally, no wheezing, rales,rhonchi or crepitation. No use of accessory muscles of respiration.  CARDIOVASCULAR: S1, S2 normal. No murmurs, rubs, or gallops.  ABDOMEN: Soft, non-tender, non-distended.  EXTREMITIES: No pedal edema.  NEUROLOGIC: Cranial nerves II through XII are intact. Muscle strength 5/5 in all extremities. Sensation intact. Gait not checked.  PSYCHIATRIC: The patient is alert and oriented x 3.  SKIN: No obvious rash, lesion, or ulcer.   DATA REVIEW:   CBC Recent Labs  Lab 05/05/21 1351 05/05/21 2229 05/06/21 0750  WBC 7.8  --   --   HGB 15.6 14.0 14.3  HCT 45.1 41.2  --   PLT 206  --   --     Chemistries  Recent Labs  Lab 05/05/21 1351  NA 140  K 4.2  CL 105  CO2 28  GLUCOSE 160*  BUN 17  CREATININE 1.08  CALCIUM 9.6  AST 19  ALT 17  ALKPHOS 45  BILITOT 0.8     Microbiology Results  Results for orders placed or performed during the hospital encounter of 05/05/21  Resp Panel by RT-PCR (Flu A&B, Covid) Nasopharyngeal Swab     Status: None   Collection Time: 05/05/21  4:23 PM   Specimen: Nasopharyngeal Swab; Nasopharyngeal(NP) swabs in vial transport medium  Result Value Ref Range Status   SARS Coronavirus 2 by RT PCR NEGATIVE NEGATIVE Final    Comment:  (NOTE) SARS-CoV-2 target nucleic acids are NOT DETECTED.  The SARS-CoV-2 RNA is generally detectable in upper respiratory specimens during the acute phase of infection. The lowest concentration of SARS-CoV-2 viral copies this assay can  detect is 138 copies/mL. A negative result does not preclude SARS-Cov-2 infection and should not be used as the sole basis for treatment or other patient management decisions. A negative result may occur with  improper specimen collection/handling, submission of specimen other than nasopharyngeal swab, presence of viral mutation(s) within the areas targeted by this assay, and inadequate number of viral copies(<138 copies/mL). A negative result must be combined with clinical observations, patient history, and epidemiological information. The expected result is Negative.  Fact Sheet for Patients:  EntrepreneurPulse.com.au  Fact Sheet for Healthcare Providers:  IncredibleEmployment.be  This test is no t yet approved or cleared by the [place] FDA and  has been authorized for detection and/or diagnosis of SARS-CoV-2 by FDA under an Emergency Use Authorization (EUA). This EUA will remain  in effect (meaning this test can be used) for the duration of the COVID-19 declaration under Section 564(b)(1) of the Act, 21 U.S.C.section 360bbb-3(b)(1), unless the authorization is terminated  or revoked sooner.       Influenza A by PCR NEGATIVE NEGATIVE Final   Influenza B by PCR NEGATIVE NEGATIVE Final    Comment: (NOTE) The Xpert Xpress SARS-CoV-2/FLU/RSV plus assay is intended as an aid in the diagnosis of influenza from Nasopharyngeal swab specimens and should not be used as a sole basis for treatment. Nasal washings and aspirates are unacceptable for Xpert Xpress SARS-CoV-2/FLU/RSV testing.  Fact Sheet for Patients: EntrepreneurPulse.com.au  Fact Sheet for Healthcare  Providers: IncredibleEmployment.be  This test is not yet approved or cleared by the [place] FDA and has been authorized for detection and/or diagnosis of SARS-CoV-2 by FDA under an Emergency Use Authorization (EUA). This EUA will remain in effect (meaning this test can be used) for the duration of the COVID-19 declaration under Section 564(b)(1) of the Act, 21 U.S.C. section 360bbb-3(b)(1), unless the authorization is terminated or revoked.  Performed at [facility], [address] [place], [place] [address]        Management plans discussed with the patient, family and they are in agreement.  CODE STATUS:     Code Status Orders  (From admission, onward)           Start     Ordered   05/05/21 1703  Full code  Continuous        05/05/21 1704           Code Status History     Date Active Date Inactive Code Status Order ID Comments User Context   09/21/2018 1302 09/22/2018 1518 Full Code [id number]  Katha Cabal, MD Inpatient   09/03/2016 1835 09/05/2016 1945 Full Code [id number]  Gladstone Lighter, MD Inpatient       TOTAL TIME TAKING CARE OF THIS PATIENT: 32 minutes.    Loletha Grayer M.D on 05/06/2021 at 5:06 PM    [facility]  CC: Primary care physician; Tonia Ghent, MD

## 2021-05-06 NOTE — ED Notes (Signed)
Hospital bed and recliner placed in room for pt and spouse. Call bell replaced on new bed and bed locked and lowered to lowest position.

## 2021-05-06 NOTE — Discharge Instructions (Signed)
Can restart aspirin in 2 days

## 2021-05-06 NOTE — ED Notes (Signed)
Dr Renae Gloss discharged pt from ED, admission cancelled.  To follow up with GI outpatient

## 2021-05-06 NOTE — Progress Notes (Signed)
GI Inpatient Follow-up Note  Subjective:  Patient seen and doing well. No bowel movement since yesterday. No blood.  Scheduled Inpatient Medications:   atorvastatin  40 mg Oral Daily   insulin aspart  0-15 Units Subcutaneous TID WC   metoprolol tartrate  12.5 mg Oral BID   mometasone-formoterol  2 puff Inhalation BID    Continuous Inpatient Infusions:    PRN Inpatient Medications:  albuterol, nitroGLYCERIN, ondansetron **OR** ondansetron (ZOFRAN) IV  Review of Systems:  Review of Systems  Constitutional:  Negative for chills and fever.  Cardiovascular:  Negative for chest pain.  Gastrointestinal:  Negative for abdominal pain, blood in stool, constipation, diarrhea and melena.  Musculoskeletal:  Negative for joint pain.  Skin:  Negative for itching and rash.  Neurological:  Negative for focal weakness.  Psychiatric/Behavioral:  Negative for substance abuse.   All other systems reviewed and are negative.    Physical Examination: BP 132/75   Pulse 68   Temp 98.7 F (37.1 C) (Oral)   Resp 16   Ht 5\' 8"  (1.727 m)   Wt 90.7 kg   SpO2 98%   BMI 30.41 kg/m  Gen: NAD, alert and oriented x 4 HEENT: PEERLA, EOMI, Neck: supple, no JVD or thyromegaly Chest: No respiratory distress Abd: soft non-tender, non-distended Ext: no edema, well perfused with 2+ pulses, Skin: no rash or lesions noted Lymph: no lymphadenopathy  Data: Lab Results  Component Value Date   WBC 7.8 05/05/2021   HGB 14.3 05/06/2021   HCT 41.2 05/05/2021   MCV 91.9 05/05/2021   PLT 206 05/05/2021   Recent Labs  Lab 05/05/21 1351 05/05/21 2229 05/06/21 0750  HGB 15.6 14.0 14.3   Lab Results  Component Value Date   NA 140 05/05/2021   K 4.2 05/05/2021   CL 105 05/05/2021   CO2 28 05/05/2021   BUN 17 05/05/2021   CREATININE 1.08 05/05/2021   Lab Results  Component Value Date   ALT 17 05/05/2021   AST 19 05/05/2021   ALKPHOS 45 05/05/2021   BILITOT 0.8 05/05/2021   Recent Labs  Lab  05/05/21 1351  INR 1.0   Assessment/Plan: Mr. Weinberg is a 75 y.o. male with lower gi bleed likely due to diverticular bleeding that has resolved.  Recommendations:  - would await bowel movement and if normal not opposed to discharge - ok to restart anti-platelets at discharge - will need outpatient GI follow-up to arrange outpatient colonoscopy  Please call with any concerns or questions.  73 MD, MPH [facility] GI

## 2021-05-06 NOTE — ED Notes (Signed)
Pt had bowel movement with with no visible blood noted.

## 2021-05-07 ENCOUNTER — Telehealth: Payer: Self-pay | Admitting: Cardiovascular Disease

## 2021-05-07 ENCOUNTER — Telehealth: Payer: Self-pay | Admitting: Family Medicine

## 2021-05-07 NOTE — Telephone Encounter (Signed)
error 

## 2021-05-07 NOTE — Telephone Encounter (Signed)
Was able to return call to Mrs. Neace, he is concern for stopping his Brilinta. Pt was seen in hospital [facility] admission for GI bleed, while during admission the d/c his Brillinta d/t risk of bleeding.  1. Lower GI bleed. Could be diverticular in nature. The patient had blood in the bowel movements prior to coming into the hospital. Patient was advanced on his diet here in the hospital. He did have 1 bowel movement here prior to discharge that was normal brown without blood. He was seen by gastroenterology Dr. Mia Creek. Follow-up as outpatient for potential colonoscopy. I discussed with patient he can restart aspirin and a day or so. The patient will speak with Dr. Mariah Milling about whether or not he can come off the Brilinta or go back on the Brilinta. Prior to colonoscopy will have to be off both agents for 5 days. Hemoglobin stable here in the hospital. 2. History of peripheral vascular disease and coronary artery disease. We held aspirin and Brilinta. Patient on metoprolol and atorvastatin.  Pt resume ASA, but unsure to restart Brilinta. Last bloody stool was 3 days ago. But is having "explosive" bowl movements at current.   GI visit with Dr. Mia Creek with [facility] not till later Nov/Dec, and colonoscopy not till Jan per pt. Pt concern for being off Brilinta and only on ASA daily. Would like Dr. Mariah Milling to advise if okay to restart. Advised will r/t Dr. Mariah Milling for his recommendations. Mr. Bray verbalized understanding and will await call back.

## 2021-05-07 NOTE — Telephone Encounter (Signed)
Patient calling  Was in hospital and they took him off some medications while there Would like to clarify if he should continue taking them Please call to discuss

## 2021-05-10 NOTE — Telephone Encounter (Signed)
Was able to reach back out to pt to f/u on Brilinta, advised per Dr. Mariah Milling  Should be taking asa 81 mg daily if off the brilinta  Can probably hold the brilinta for now, complete GI workup  Thx  TGollan   [name] verbalized understanding and thankful for reaching back out to him.

## 2021-05-19 ENCOUNTER — Other Ambulatory Visit: Payer: Self-pay | Admitting: Cardiovascular Disease

## 2021-05-21 ENCOUNTER — Other Ambulatory Visit (INDEPENDENT_AMBULATORY_CARE_PROVIDER_SITE_OTHER): Payer: Medicare Other

## 2021-05-21 ENCOUNTER — Other Ambulatory Visit: Payer: Self-pay

## 2021-05-21 ENCOUNTER — Other Ambulatory Visit: Payer: Medicare Other

## 2021-05-21 DIAGNOSIS — E1151 Type 2 diabetes mellitus with diabetic peripheral angiopathy without gangrene: Secondary | ICD-10-CM

## 2021-05-21 LAB — BASIC METABOLIC PANEL
BUN: 18 mg/dL (ref 6–23)
CO2: 29 mEq/L (ref 19–32)
Calcium: 9.9 mg/dL (ref 8.4–10.5)
Chloride: 104 mEq/L (ref 96–112)
Creatinine, Ser: 1.17 mg/dL (ref 0.40–1.50)
GFR: 60.73 mL/min (ref >60.00)
Glucose, Bld: 137 mg/dL — ABNORMAL HIGH (ref 70–99)
Potassium: 4.3 mEq/L (ref 3.5–5.1)
Sodium: 141 mEq/L (ref 135–145)

## 2021-05-21 LAB — HEMOGLOBIN A1C: Hgb A1c MFr Bld: 9.6 % — ABNORMAL HIGH (ref 4.6–6.5)

## 2021-05-25 LAB — FRUCTOSAMINE: Fructosamine: 278 umol/L (ref 205–285)

## 2021-05-27 ENCOUNTER — Ambulatory Visit (INDEPENDENT_AMBULATORY_CARE_PROVIDER_SITE_OTHER): Payer: Medicare Other | Admitting: Family Medicine

## 2021-05-27 ENCOUNTER — Telehealth: Payer: Self-pay

## 2021-05-27 ENCOUNTER — Other Ambulatory Visit: Payer: Self-pay

## 2021-05-27 ENCOUNTER — Encounter: Payer: Self-pay | Admitting: Family Medicine

## 2021-05-27 VITALS — BP 124/70 | HR 63 | Temp 97.6°F | Ht 68.0 in | Wt 192.0 lb

## 2021-05-27 DIAGNOSIS — E785 Hyperlipidemia, unspecified: Secondary | ICD-10-CM | POA: Diagnosis not present

## 2021-05-27 DIAGNOSIS — K921 Melena: Secondary | ICD-10-CM

## 2021-05-27 DIAGNOSIS — Z23 Encounter for immunization: Secondary | ICD-10-CM

## 2021-05-27 DIAGNOSIS — E1151 Type 2 diabetes mellitus with diabetic peripheral angiopathy without gangrene: Secondary | ICD-10-CM

## 2021-05-27 DIAGNOSIS — E1169 Type 2 diabetes mellitus with other specified complication: Secondary | ICD-10-CM | POA: Diagnosis not present

## 2021-05-27 NOTE — Patient Instructions (Signed)
Plan on recheck labs prior to a visit in about 3-4 months.  You don't have to fast.   Take care.  Glad to see you. I sent a note to the GI clinic about trying to get your appointment moved sooner.

## 2021-05-27 NOTE — Telephone Encounter (Signed)
Sending this mote to Phil Dopp, received forms from [employer] stating patient will need re enrollment for Lantus for 07/2021. I have the forms if need this. This may have been addressed already

## 2021-05-27 NOTE — Progress Notes (Signed)
This visit occurred during the SARS-CoV-2 public health emergency.  Safety protocols were in place, including screening questions prior to the visit, additional usage of staff PPE, and extensive cleaning of exam room while observing appropriate contact time as indicated for disinfecting solutions.  Diabetes:  Using medications without difficulties: yes, taking 90 units daily.  Metformin 500mg  BID.  Hypoglycemic episodes:no Hyperglycemic episodes:no Feet problems:no Blood Sugars averaging: up to 200 but down to 130s o/w.  Usually ~160 in the AM.   eye exam within last year: yes Labs d/w pt.  He likely had artificially high A1c, so using his fructosamine level is likely more accurate, d/w pt.  A1c 6.3 based on fuctosamine.    He had blood in stool (was on brilinta at baseline but had been advised by dental clinic to take ibuprofen along with it).  To ER 05/05/21.  Then he had 3 teeth extracted.  He is still dealing with R jaw pain and is going to see the dental clinic this AM.  He is off brilinta now.  No more blood seen in stool.  No black stools.  Normal stools now.  He is back on aspirin in the meantime.    Flu shot today.    Meds, vitals, and allergies reviewed.   ROS: Per HPI unless specifically indicated in ROS section   GEN: nad, alert and oriented HEENT: ncat NECK: supple w/o LA CV: rrr. PULM: ctab, no inc wob ABD: soft, +bs EXT: no edema SKIN: no acute rash  Diabetic foot exam: Normal inspection No skin breakdown No calluses  Normal DP pulse R foot, slightly dec L foot Normal sensation to light touch and monofilament Nails normal  33 minutes were devoted to patient care in this encounter (this includes time spent reviewing the patient's file/history, interviewing and examining the patient, counseling/reviewing plan with patient).

## 2021-05-28 NOTE — Progress Notes (Signed)
Called patient; he took his patient assistance forms for Lantus to Dr. Para March yesterday. Patient did not fill out his portion or sign; I am emailing patient new forms with his portions highlighted. Patient will mail them back to [facility] location when complete. Patients email is: [email or web address]. Patient Assistance forms have been completed and uploaded. Forms also e-mailed to patient for completion.   Phil Dopp, CPP notified  Claudina Lick, [place] Clinical Pharmacy Assistant [phone]  Time Spent:  30 Minutes

## 2021-05-30 DIAGNOSIS — K921 Melena: Secondary | ICD-10-CM | POA: Insufficient documentation

## 2021-05-30 NOTE — Assessment & Plan Note (Signed)
Labs d/w pt.  He likely had artificially high A1c, so using his fructosamine level is likely more accurate, d/w pt.  A1c 6.3 based on fuctosamine.   Based on fructosamine level, no change in medications at this point.  Continue insulin and metformin.  Recheck periodically.  We can recheck fructosamine going forward.

## 2021-05-30 NOTE — Assessment & Plan Note (Addendum)
He was on Brilinta at baseline but was taking ibuprofen along with it upon the advice of his dental clinic.  He noted blood in his stool.  Since then has been taken off Brilinta, he is no longer using ibuprofen, and he is on aspirin at baseline per cardiology.  He has not seen blood in his stool.  Benign abdominal exam.  I think it makes sense to see the GI clinic and I sent a note asking for his visit to get moved sooner, if possible.  Routine cautions given to patient.  Would continue aspirin.  Would hold NSAIDs and Brilinta in the meantime.  He agrees with plan.

## 2021-05-31 NOTE — Progress Notes (Signed)
Called patient to verify he received his patient assistance via e-mail. He did receive them and will send them back as soon as he fills them out.   Phil Dopp, CPP notified  Claudina Lick, [place] Clinical Pharmacy Assistant [phone]  Time Spent:  2 Minutes

## 2021-06-02 NOTE — Progress Notes (Signed)
Patient left me a message asking me to return his call. Spoke with patient; the e-mail with his patient assistance forms did not come through correctly (so small he could not read it). I will attempt to resend it to him at [email or web address]. Forms have been resent.   Phil Dopp, CPP notified  Claudina Lick, [place] Clinical Pharmacy Assistant [phone]  Time Spent:  8 Minutes

## 2021-06-08 NOTE — Telephone Encounter (Signed)
Awesome, can you make a note to check back with office around 06/15/21.

## 2021-06-08 NOTE — Progress Notes (Signed)
Absolutely! I have added it to my schedule to call on 06/15/2021.  Phil Dopp, CPP notified  Claudina Lick, [place] Clinical Pharmacy Assistant [phone]

## 2021-06-08 NOTE — Progress Notes (Signed)
Spoke with patient; he did receive the patient assistance paperwork. He is filing it out today and he will mail it back ASAP.   Phil Dopp, CPP notified  Claudina Lick, [place] Clinical Pharmacy Assistant [phone]  Time Spent:  5 Minutes

## 2021-06-15 ENCOUNTER — Telehealth: Payer: Self-pay

## 2021-06-15 NOTE — Telephone Encounter (Signed)
Application received for Lantus PAP renewal 2023. Reviewed and sent to Anastasiya. She will have Dr. Para March sign page 4 and fax to [facility]. Notified patient that application was received by mail.

## 2021-06-15 NOTE — Telephone Encounter (Signed)
Signed. Thanks.

## 2021-06-15 NOTE — Telephone Encounter (Signed)
Form faxed over as requested

## 2021-06-15 NOTE — Telephone Encounter (Signed)
Form placed in Dr Lianne Bushy inbox for signature.

## 2021-07-14 ENCOUNTER — Other Ambulatory Visit (INDEPENDENT_AMBULATORY_CARE_PROVIDER_SITE_OTHER): Payer: Self-pay | Admitting: Vascular Surgery

## 2021-07-15 ENCOUNTER — Encounter (INDEPENDENT_AMBULATORY_CARE_PROVIDER_SITE_OTHER): Payer: Medicare Other

## 2021-07-15 ENCOUNTER — Ambulatory Visit (INDEPENDENT_AMBULATORY_CARE_PROVIDER_SITE_OTHER): Payer: Medicare Other | Admitting: Vascular Surgery

## 2021-07-23 DIAGNOSIS — H353123 Nonexudative age-related macular degeneration, left eye, advanced atrophic without subfoveal involvement: Secondary | ICD-10-CM | POA: Diagnosis not present

## 2021-07-23 DIAGNOSIS — H43822 Vitreomacular adhesion, left eye: Secondary | ICD-10-CM | POA: Diagnosis not present

## 2021-07-23 DIAGNOSIS — H353213 Exudative age-related macular degeneration, right eye, with inactive scar: Secondary | ICD-10-CM | POA: Diagnosis not present

## 2021-07-23 DIAGNOSIS — H348312 Tributary (branch) retinal vein occlusion, right eye, stable: Secondary | ICD-10-CM | POA: Diagnosis not present

## 2021-07-28 ENCOUNTER — Telehealth: Payer: Self-pay

## 2021-07-28 NOTE — Progress Notes (Addendum)
Chronic Care Management Pharmacy Assistant   Name: [name]  MRN: [id number] DOB: 27-Feb-1946  Reason for Encounter: CCM (Hypertension Disease State)   Recent office visits:  05/27/2021 - Elsie Stain, MD - Patient presented for diabetes. Labs: BMP, Fructosamine and A1c. Immunizations given: [employer] (high dose 65+).   Recent consult visits:  04/27/2021 - Cardiology - Patient presented for coronary artery disease. Orders: EKG Change: ticagrelor (BRILINTA) 60 MG TABS tablet.   Hospital visits:  Medication Reconciliation was completed by comparing discharge summary, patients EMR and Pharmacy list, and upon discussion with patient.  Clinical Impressions: Gastrointestinal hemorrhage  Admitted to the hospital on 05/05/2021 due to bloody stools. Discharge date was 05/06/2021. Discharged from [facility]?Medications Started at [facility] Discharge:?? None noted  Medication Changes at Hospital Discharge: None noted  Medications Discontinued at Hospital Discharge: -Stopped amLODipine (NORVASC) 5 MG tablet -Stopped amoxicillin (AMOXIL) 500 MG capsule -Stopped aspirin 81 MG EC tablet -Stopped predniSONE (DELTASONE) 50 MG tablet -Stopped ticagrelor (BRILINTA) 60 MG TABS tablet  Medications that remain the same after Hospital Discharge:??  -All other medications will remain the same.    Medications: Outpatient Encounter Medications as of 07/28/2021  Medication Sig   acetaminophen (TYLENOL) 325 MG tablet Take 1-2 tablets (325-650 mg total) by mouth every 4 (four) hours as needed for mild pain (or temp >/= 101 F).   albuterol (PROAIR HFA) 108 (90 BASE) MCG/ACT inhaler Inhale 2 puffs into the lungs 3 (three) times daily as needed (cough).   aspirin EC 81 MG tablet Take 81 mg by mouth daily. Swallow whole.   atorvastatin (LIPITOR) 40 MG tablet TAKE 1 TABLET(40 MG) BY MOUTH DAILY   blood glucose meter kit and supplies KIT 1 each by Does not apply route daily  as needed. Dispense based on patient and insurance preference. Use up to four times daily as directed. (FOR ICD-9 250.00, 250.01).   EPINEPHrine 0.3 mg/0.3 mL IJ SOAJ injection SMARTSIG:0.3 Milliliter(s) IM Once PRN   famotidine (PEPCID) 20 MG tablet Take 1 tablet (20 mg total) by mouth 2 (two) times daily.   fenofibrate (TRICOR) 145 MG tablet TAKE 1 TABLET(145 MG) BY MOUTH DAILY   fluticasone-salmeterol (ADVAIR DISKUS) 250-50 MCG/ACT AEPB Inhale 1 puff into the lungs in the morning and at bedtime.   glucosamine-chondroitin 500-400 MG tablet Take 1 tablet by mouth 2 (two) times daily.   glucose blood (ONETOUCH ULTRA) test strip CHECK SUGAR TWICE DAILY   insulin glargine (LANTUS SOLOSTAR) 100 UNIT/ML Solostar Pen ADMINISTER 90-100 UNITS UNDER THE SKIN DAILY   Insulin Pen Needle (B-D UF III MINI PEN NEEDLES) 31G X 5 MM MISC Use as instructed to inject insulin.  Diagnosis:  E11.9  Insulin dependent.   Lancets (ONETOUCH ULTRASOFT) lancets Use as instructed to check sugar, dx E11.9.   lisinopril (ZESTRIL) 40 MG tablet Take 1 tablet (40 mg total) by mouth daily.   meclizine (ANTIVERT) 25 MG tablet Take 25 mg by mouth every 6 (six) hours as needed. For dizziness/vertigo   metFORMIN (GLUCOPHAGE) 500 MG tablet Take 1 tablet (500 mg total) by mouth 2 (two) times daily with a meal.   metoprolol tartrate (LOPRESSOR) 25 MG tablet Take 0.5 tablets (12.5 mg total) by mouth 2 (two) times daily.   Multiple Vitamins-Minerals (PRESERVISION AREDS 2 PO) Take 1 tablet by mouth 2 (two) times daily.   nitroGLYCERIN (NITROSTAT) 0.4 MG SL tablet Place 1 tablet (0.4 mg total) under the tongue every 5 (  five) minutes as needed. For chest pain   No facility-administered encounter medications on file as of 07/28/2021.     Recent Office Vitals: BP Readings from Last 3 Encounters:  05/27/21 124/70  05/06/21 124/68  04/27/21 136/68   Pulse Readings from Last 3 Encounters:  05/27/21 63  05/06/21 78  04/27/21 60    Wt  Readings from Last 3 Encounters:  05/27/21 192 lb (87.1 kg)  05/05/21 200 lb (90.7 kg)  04/27/21 202 lb 4 oz (91.7 kg)    Kidney Function Lab Results  Component Value Date/Time   CREATININE 1.17 05/21/2021 08:19 AM   CREATININE 1.08 05/05/2021 01:51 PM   CREATININE 1.32 (H) 10/29/2014 09:08 AM   CREATININE 1.40 (H) 10/28/2014 02:45 AM   CREATININE 0.96 01/28/2011 08:55 AM   CREATININE 1.16 01/07/2011 08:35 AM   GFR 60.73 05/21/2021 08:19 AM   GFRNONAA >60 05/05/2021 01:51 PM   GFRNONAA 55 (L) 10/29/2014 09:08 AM   GFRAA 56 (L) 11/22/2019 09:52 AM   GFRAA >60 10/29/2014 09:08 AM   BMP Latest Ref Rng & Units 05/21/2021 05/05/2021 02/10/2021  Glucose 70 - 99 mg/dL 137(H) 160(H) 178(H)  BUN 6 - 23 mg/dL 18 17 15   Creatinine 0.40 - 1.50 mg/dL 1.17 1.08 1.20  Sodium 135 - 145 mEq/L 141 140 141  Potassium 3.5 - 5.1 mEq/L 4.3 4.2 3.9  Chloride 96 - 112 mEq/L 104 105 104  CO2 19 - 32 mEq/L 29 28 26   Calcium 8.4 - 10.5 mg/dL 9.9 9.6 9.5   Contacted patient on 07/11/2021 to discuss hypertension disease state  Current antihypertensive regimen:  Lisinopril 40 mg - 1 tablet daily Nitroglycerin 0.4 mg  Patient verbally confirms he is taking the above medications as directed. Yes  How often are you checking your Blood Pressure? 3-5x per week  he checks his blood pressure in the morning before taking his medication. No readings available today.  Wrist or arm cuff: Arm Caffeine intake: Patient does not drink caffeine Salt intake: Patient watches his salt intake very carefully Over the counter medications including pseudoephedrine or NSAIDs? No  Any readings above 180/120? No  What recent interventions/DTPs have been made by any provider to improve Blood Pressure control since last CPP Visit: No recent interventions  Any recent hospitalizations or ED visits since last visit with CPP? Yes - Gastrointestinal hemorrhage  What diet changes have been made to improve Blood Pressure Control?   Patient watches his salt intake very carefully and patient does not drink any caffeine.   What exercise is being done to improve your Blood Pressure Control?  Patient does not do a lot of exercise during the winter months.   Adherence Review: Is the patient currently on ACE/ARB medication? Yes Does the patient have >5 day gap between last estimated fill dates? No - Atorvastatin 40 mg due for refill today.  Star Rating Drugs:  Medication:  Last Fill: Day Supply Atorvastatin 40 mg 04/29/2021 90 Lisinopril 40 mg 07/24/2020 90  Metformin 500 mg 07/08/2021 90 Lantus 100 units 07/14/2020 42 Verified last fill dates with [facility]  Care Gaps: Annual wellness visit in last year? Yes 02/09/2021 Most Recent BP reading: 124/70 on 05/27/2021  If Diabetic: Most recent A1C reading:  9.6 on 05/21/2021 Last eye exam / retinopathy screening: Up to date Last diabetic foot exam: Up to date  Upcoming appointments: CCM appointment on 08/17/2021 with Debbora Dus PCP appointment on 09/02/2021  Debbora Dus, CPP notified  Marijean Niemann, [place]  Assistant [phone]  I have reviewed the care management and care coordination activities outlined in this encounter and I am certifying that I agree with the content of this note. No further action required.  Debbora Dus, PharmD Clinical Pharmacist [facility] [phone]

## 2021-08-06 NOTE — Progress Notes (Signed)
Called patient to get his blood pressure readings as previously discussed. Patient states he takes his blood pressure in the evenings.  Date  Reading 01/25  159/80 taken in morning 01/24  173/77 01/23  149/78 01/22  146/67 01/21  127/69 01/20  155/76 01/19  169/78 01/18  155/72  I asked patient to continue to take his blood pressure daily until 08/17/2021 when he has a telephone appointment with Phil Dopp. Patient agreed.  I also reminded patient of his upcoming telephone appointment with Phil Dopp on 08/17/2021 at 2:00 pm and his upcoming appointment with Dr. Para March on 09/02/2021  Phil Dopp, CPP notified  Claudina Lick, [place] Clinical Pharmacy Assistant [phone]  Time Spent: 15 Minutes

## 2021-08-12 ENCOUNTER — Telehealth: Payer: Self-pay

## 2021-08-12 NOTE — Progress Notes (Signed)
Chronic Care Management Pharmacy Assistant   Name: [name]  MRN: [id number] DOB: [birth year]  Reason for Encounter: CCM (Appointment Reminder)   Medications: Outpatient Encounter Medications as of 08/12/2021  Medication Sig   acetaminophen (TYLENOL) 325 MG tablet Take 1-2 tablets (325-650 mg total) by mouth every 4 (four) hours as needed for mild pain (or temp >/= 101 F).   albuterol (PROAIR HFA) 108 (90 BASE) MCG/ACT inhaler Inhale 2 puffs into the lungs 3 (three) times daily as needed (cough).   aspirin EC 81 MG tablet Take 81 mg by mouth daily. Swallow whole.   atorvastatin (LIPITOR) 40 MG tablet TAKE 1 TABLET(40 MG) BY MOUTH DAILY   blood glucose meter kit and supplies KIT 1 each by Does not apply route daily as needed. Dispense based on patient and insurance preference. Use up to four times daily as directed. (FOR ICD-9 250.00, 250.01).   EPINEPHrine 0.3 mg/0.3 mL IJ SOAJ injection SMARTSIG:0.3 Milliliter(s) IM Once PRN   famotidine (PEPCID) 20 MG tablet Take 1 tablet (20 mg total) by mouth 2 (two) times daily.   fenofibrate (TRICOR) 145 MG tablet TAKE 1 TABLET(145 MG) BY MOUTH DAILY   fluticasone-salmeterol (ADVAIR DISKUS) 250-50 MCG/ACT AEPB Inhale 1 puff into the lungs in the morning and at bedtime.   glucosamine-chondroitin 500-400 MG tablet Take 1 tablet by mouth 2 (two) times daily.   glucose blood (ONETOUCH ULTRA) test strip CHECK SUGAR TWICE DAILY   insulin glargine (LANTUS SOLOSTAR) 100 UNIT/ML Solostar Pen ADMINISTER 90-100 UNITS UNDER THE SKIN DAILY   Insulin Pen Needle (B-D UF III MINI PEN NEEDLES) 31G X 5 MM MISC Use as instructed to inject insulin.  Diagnosis:  E11.9  Insulin dependent.   Lancets (ONETOUCH ULTRASOFT) lancets Use as instructed to check sugar, dx E11.9.   lisinopril (ZESTRIL) 40 MG tablet Take 1 tablet (40 mg total) by mouth daily.   meclizine (ANTIVERT) 25 MG tablet Take 25 mg by mouth every 6 (six) hours as needed. For dizziness/vertigo    metFORMIN (GLUCOPHAGE) 500 MG tablet Take 1 tablet (500 mg total) by mouth 2 (two) times daily with a meal.   metoprolol tartrate (LOPRESSOR) 25 MG tablet Take 0.5 tablets (12.5 mg total) by mouth 2 (two) times daily.   Multiple Vitamins-Minerals (PRESERVISION AREDS 2 PO) Take 1 tablet by mouth 2 (two) times daily.   nitroGLYCERIN (NITROSTAT) 0.4 MG SL tablet Place 1 tablet (0.4 mg total) under the tongue every 5 (five) minutes as needed. For chest pain   No facility-administered encounter medications on file as of 08/12/2021.   BP Readings from Last 3 Encounters:  05/27/21 124/70  05/06/21 124/68  04/27/21 136/68    Lab Results  Component Value Date   HGBA1C 9.6 (H) 05/21/2021    [name] was contacted to remind of upcoming telephone visit with Debbora Dus on 08/17/2021 at 2:00 pm. Patient was reminded to have all medications, supplements and any blood glucose and blood pressure readings available for review at appointment. If unable to reach, a voicemail was left for patient.   Are you having any problems with your medications? No   Do you have any concerns you like to discuss with the pharmacist? No  Star Rating Drugs: Medication:  Last Fill: Day Supply Atorvastatin 40 mg 04/27/2021 90  Lisinopril 40 mg 07/17/2021 90  Metformin 500 mg 07/05/2021 90  Lantus 100 units 07/14/2020 [place], CPP notified  Marijean Niemann, [place] Pharmacy Assistant  [phone]  Time Spent: 10 Minutes

## 2021-08-17 ENCOUNTER — Telehealth: Payer: Medicare Other

## 2021-08-19 ENCOUNTER — Other Ambulatory Visit: Payer: Self-pay

## 2021-08-19 ENCOUNTER — Ambulatory Visit (INDEPENDENT_AMBULATORY_CARE_PROVIDER_SITE_OTHER): Payer: Medicare Other

## 2021-08-19 DIAGNOSIS — E785 Hyperlipidemia, unspecified: Secondary | ICD-10-CM

## 2021-08-19 DIAGNOSIS — I10 Essential (primary) hypertension: Secondary | ICD-10-CM

## 2021-08-19 DIAGNOSIS — E1169 Type 2 diabetes mellitus with other specified complication: Secondary | ICD-10-CM

## 2021-08-19 NOTE — Progress Notes (Signed)
Chronic Care Management Pharmacy Note  08/19/2021 Name:  [name] MRN:  [id number] DOB:  28-Oct-1945  Summary: CCM follow up visit. No barriers to adherence identified. Receives Lantus through patient assistance. Discussed diabetes. Last fructosamine 278 Nov 2022 equivalent to A1c of 6.3%. Patient reports fasting BG ranges 160-170 on average. He does not check BG after meals. There is room for improvement in BG control and given CAD, I would consider adding a GLP-1. We also discussed HTN. His clinic readings have been < 130/80, but home readings elevated. He uses automatic arm cuff and checks after resting. Limits salt. No caffeine intake. Asked him to bring his BP log and monitor to upcoming PCP visit. Upon reviewing meds, he is taking amlodipine although it was d/c after October hospital visit GI bleed. I've added this back to med list since pt has been taking and BP elevated.  Recommendations: Consider GLP-1 for CV benefits Check home blood pressure cuff accuracy   Plan: PCP visit 09/02/21 CCM PharmD follow up 6 months CCM team blood glucose log review in 3 months   Subjective: [name] is an 76 y.o. year old male who is a primary patient of Damita Dunnings, Elveria Rising, MD.  The CCM team was consulted for assistance with disease management and care coordination needs.    Engaged with patient by telephone for follow up visit in response to provider referral for pharmacy case management and/or care coordination services.   Consent to Services:  The patient was given information about Chronic Care Management services, agreed to services, and gave verbal consent prior to initiation of services.  Please see initial visit note for detailed documentation.   Patient Care Team: Tonia Ghent, MD as PCP - Claris Gower, [facility] (Pharmacist) Minna Merritts, MD as Consulting Physician (Cardiology)  Recent office visits: 06/15/21 - CCM - Lantus PAP completed 05/27/21 - Elsie Stain, MD - Pt presented for diabetes follow up. Fructosamine 6.3%. Foot exam completed. Continue current medications.  Recent consult visits: None in previous 6 months   Hospital visits: 05/05/2021 - ED visit for GI hemorrhage   Objective:  Lab Results  Component Value Date   CREATININE 1.17 05/21/2021   BUN 18 05/21/2021   GFR 60.73 05/21/2021   GFRNONAA >60 05/05/2021   GFRAA 56 (L) 11/22/2019   NA 141 05/21/2021   K 4.3 05/21/2021   CALCIUM 9.9 05/21/2021   CO2 29 05/21/2021   GLUCOSE 137 (H) 05/21/2021    Lab Results  Component Value Date/Time   HGBA1C 9.6 (H) 05/21/2021 08:19 AM   HGBA1C 9.3 (H) 05/05/2021 01:51 PM   HGBA1C 7.1 (H) 10/25/2014 03:02 AM   FRUCTOSAMINE 278 05/21/2021 08:19 AM   FRUCTOSAMINE 301 (H) 02/16/2021 11:35 AM   GFR 60.73 05/21/2021 08:19 AM   GFR 59.03 (L) 02/10/2021 07:40 AM    Lab Results  Component Value Date   CHOL 99 02/10/2021   HDL 22.90 (L) 02/10/2021   LDLCALC UNABLE TO CALCULATE IF TRIGLYCERIDE OVER 400 mg/dL 09/03/2016   LDLDIRECT 39.0 02/10/2021   TRIG 283.0 (H) 02/10/2021   CHOLHDL 4 02/10/2021    Hepatic Function Latest Ref Rng & Units 05/05/2021 02/10/2021 01/17/2020  Total Protein 6.5 - 8.1 g/dL 7.1 6.5 6.1  Albumin 3.5 - 5.0 g/dL 4.2 4.0 4.0  AST 15 - 41 U/L _0 ALT 0 - 44 U/L _1 Alk Phosphatase 38 - 126 U/L 45 42 34(L)  Total  Bilirubin 0.3 - 1.2 mg/dL 0.8 0.6 0.6  Bilirubin, Direct 0.0 - 0.3 mg/dL - - -    Lab Results  Component Value Date/Time   TSH 2.390 05/11/2009 09:25 PM    CBC Latest Ref Rng & Units 05/06/2021 05/05/2021 05/05/2021  WBC 4.0 - 10.5 K/uL - - 7.8  Hemoglobin 13.0 - 17.0 g/dL 14.3 14.0 15.6  Hematocrit 39.0 - 52.0 % - 41.2 45.1  Platelets 150 - 400 K/uL - - 206   No results found for: VD25OH  Clinical ASCVD: Yes  The ASCVD Risk score (Arnett DK, et al., 2019) failed to calculate for the following reasons:   The patient has a prior MI or stroke diagnosis    Depression  screen [facility] 2/9 02/09/2021 05/14/2020 03/31/2017  Decreased Interest 0 0 0  Down, Depressed, Hopeless 0 0 0  PHQ - 2 Score 0 0 0  Altered sleeping 0 - 0  Tired, decreased energy 0 - 0  Change in appetite 0 - 0  Feeling bad or failure about yourself  0 - 0  Trouble concentrating 0 - 0  Moving slowly or fidgety/restless 0 - 0  Suicidal thoughts 0 - 0  PHQ-9 Score 0 - 0  Difficult doing work/chores Not difficult at all - Not difficult at all  Some recent data might be hidden    Social History   Tobacco Use  Smoking Status Former   Packs/day: 1.00   Years: 40.00   Pack years: 40.00   Types: Cigarettes   Quit date: 10/21/1998   Years since quitting: 22.8  Smokeless Tobacco Never  Tobacco Comments   Past smoker, states he will smoke maybe 1 cigarette/ month or so still   BP Readings from Last 3 Encounters:  05/27/21 124/70  05/06/21 124/68  04/27/21 136/68   Pulse Readings from Last 3 Encounters:  05/27/21 63  05/06/21 78  04/27/21 60   Wt Readings from Last 3 Encounters:  05/27/21 192 lb (87.1 kg)  05/05/21 200 lb (90.7 kg)  04/27/21 202 lb 4 oz (91.7 kg)   BMI Readings from Last 3 Encounters:  05/27/21 29.19 kg/m  05/05/21 30.41 kg/m  04/27/21 30.75 kg/m   Assessment/Interventions: Review of patient past medical history, allergies, medications, health status, including review of consultants reports, laboratory and other test data, was performed as part of comprehensive evaluation and provision of chronic care management services.   SDOH:  (Social Determinants of Health) assessments and interventions performed: Yes -  SDOH Interventions    Flowsheet Row Most Recent Value  SDOH Interventions   Financial Strain Interventions Other (Comment)  [Lantus PAP]       SDOH Screenings   Alcohol Screen: Low Risk    Last Alcohol Screening Score (AUDIT): 1  Depression (PHQ2-9): Low Risk    PHQ-2 Score: 0  Financial Resource Strain: Low Risk    Difficulty of Paying Living  Expenses: Not very hard  Food Insecurity: No Food Insecurity   Worried About Charity fundraiser in the Last Year: Never true   Ran Out of Food in the Last Year: Never true  Housing: Low Risk    Last Housing Risk Score: 0  Physical Activity: Inactive   Days of Exercise per Week: 0 days   Minutes of Exercise per Session: 0 min  Social Connections: Not on file  Stress: No Stress Concern Present   Feeling of Stress : Not at all  Tobacco Use: Medium Risk   Smoking Tobacco Use: Former  Smokeless Tobacco Use: Never   Passive Exposure: Not on file  Transportation Needs: No Transportation Needs   Lack of Transportation (Medical): No   Lack of Transportation (Non-Medical): No    CCM Care Plan  Allergies  Allergen Reactions   Augmentin [Amoxicillin-Pot Clavulanate] Other (See Comments)    Nausea,vomiting,diarrhea   Morphine And Related Nausea And Vomiting    Vomiting, GI upset   Metformin And Related Other (See Comments)    Intolerant of 20100m a day.     Medications Reviewed Today     Reviewed by ADebbora Dus [facility](Pharmacist) on 08/19/21 at 1452  Med List Status: <None>   Medication Order Taking? Sig Documenting Provider Last Dose Status Informant  acetaminophen (TYLENOL) 325 MG tablet [id number] Take 1-2 tablets (325-650 mg total) by mouth every 4 (four) hours as needed for mild pain (or temp >/= 101 F). CJuanna Cao MD  Active Spouse/Significant Other  albuterol ([facility]) 108 (90 BASE) MCG/ACT inhaler [id number] Inhale 2 puffs into the lungs 3 (three) times daily as needed (cough). DTonia Ghent MD  Active Spouse/Significant Other  amLODipine (NORVASC) 5 MG tablet [id number] Take 5 mg by mouth daily. [provider] Taking Active            Med Note (Andree Elk Aleigha Gilani   Thu Aug 19, 2021  2:42 PM) Per patient's wife  aspirin EC 81 MG tablet [id number] Take 81 mg by mouth daily. Swallow whole. [provider] Taking Active   atorvastatin  (LIPITOR) 40 MG tablet [id number] TAKE 1 TABLET(40 MG) BY MOUTH DAILY Gollan, TKathlene November MD Taking Active Spouse/Significant Other  blood glucose meter kit and supplies KIT [id number] 1 each by Does not apply route daily as needed. Dispense based on patient and insurance preference. Use up to four times daily as directed. (FOR ICD-9 250.00, 250.01). DTonia Ghent MD  Active Spouse/Significant Other  EPINEPHrine 0.3 mg/0.3 mL IJ SOAJ injection [id number] SMARTSIG:0.3 Milliliter(s) IM Once PRN [provider]  Active Spouse/Significant Other  famotidine (PEPCID) 20 MG tablet [id number] Take 1 tablet (20 mg total) by mouth 2 (two) times daily. SPaulette Blanch MD  Active Spouse/Significant Other  fenofibrate (TRICOR) 145 MG tablet [id number] TAKE 1 TABLET(145 MG) BY MOUTH DAILY Gollan, TKathlene November MD  Active   fluticasone-salmeterol (ADVAIR DISKUS) 250-50 MCG/ACT AEPB [id number] Inhale 1 puff into the lungs in the morning and at bedtime. DTonia Ghent MD  Active Spouse/Significant Other  glucosamine-chondroitin 500-400 MG tablet [id number] Take 1 tablet by mouth 2 (two) times daily. [provider]  Active Spouse/Significant Other  glucose blood (ONETOUCH ULTRA) test strip [id number] CHECK SUGAR TWICE DAILY DTonia Ghent MD  Active Spouse/Significant Other  insulin glargine (LANTUS SOLOSTAR) 100 UNIT/ML Solostar Pen [id number] ADMINISTER 90-100 UNITS UNDER THE SKIN DAILY DTonia Ghent MD Taking Active Spouse/Significant Other  Insulin Pen Needle (B-D UF III MINI PEN NEEDLES) 31G X 5 MM MISC [id number] Use as instructed to inject insulin.  Diagnosis:  E11.9  Insulin dependent. DTonia Ghent MD Taking Active Spouse/Significant Other  Lancets ([facility]) lancets [id number] Use as instructed to check sugar, dx E11.9. DTonia Ghent MD Taking Active Spouse/Significant Other  lisinopril (ZESTRIL) 40 MG tablet [id number] Take 1 tablet (40 mg total) by  mouth daily. GMinna Merritts MD Taking Active Spouse/Significant Other  meclizine (ANTIVERT) 25 MG tablet [id number] Take 25 mg  by mouth every 6 (six) hours as needed. For dizziness/vertigo [provider]  Active Spouse/Significant Other  metFORMIN (GLUCOPHAGE) 500 MG tablet [id number] Yes Take 1 tablet (500 mg total) by mouth 2 (two) times daily with a meal. Tonia Ghent, MD Taking Active Spouse/Significant Other  metoprolol tartrate (LOPRESSOR) 25 MG tablet [id number] Yes Take 0.5 tablets (12.5 mg total) by mouth 2 (two) times daily. Minna Merritts, MD Taking Active Spouse/Significant Other  Multiple Vitamins-Minerals (PRESERVISION AREDS 2 PO) [id number]  Take 1 tablet by mouth 2 (two) times daily. [provider]  Active Spouse/Significant Other  nitroGLYCERIN (NITROSTAT) 0.4 MG SL tablet [id number]  Place 1 tablet (0.4 mg total) under the tongue every 5 (five) minutes as needed. For chest pain Minna Merritts, MD  Active Spouse/Significant Other            Patient Active Problem List   Diagnosis Date Noted   Blood in stool 05/30/2021   Lower GI bleed    Tobacco abuse    Rectal bleeding 05/05/2021   Nicotine dependence 05/05/2021   Allergic reaction 03/24/2021   Health care maintenance 02/17/2021   Aortic ectasia, abdominal ([place]) 07/17/2019   Lower abdominal pain 06/27/2019   Carotid artery stenosis without cerebral infarction, left 09/21/2018   Abnormal dreams 08/05/2018   Creatinine elevation 08/05/2018   Dysuria 01/14/2018   CAD (coronary artery disease), native coronary artery 07/16/2017   Shoulder pain 12/30/2016   Skin lesion 12/30/2016   Unstable angina ([place]) 09/03/2016   AAA (abdominal aortic aneurysm) without rupture 07/13/2016   Popliteal artery aneurysm ([place]) 07/13/2016   Carotid artery disease ([place]) 01/30/2015   NSTEMI (non-ST elevated myocardial infarction) ([place]) 11/16/2014   Medicare annual wellness visit, initial 10/21/2014   Advance  care planning 10/21/2014   PVD (peripheral vascular disease) ([place]) 09/09/2014   COPD exacerbation ([place]) 08/21/2014   Type 2 diabetes mellitus with hyperlipidemia ([place]) 05/24/2013   Back pain 02/24/2013   Cough 01/26/2011   COPD (chronic obstructive pulmonary disease) ([place]) 12/26/2010   FATIGUE 05/11/2009   Essential hypertension 05/08/2009   Hyperlipidemia 06/20/2008    Immunization History  Administered Date(s) Administered   Fluad Quad(high Dose 65+) 05/14/2020, 05/27/2021   Influenza,inj,Quad PF,6+ Mos 07/16/2013, 04/21/2014, 06/02/2015, 06/10/2016, 03/31/2017, 08/02/2018   PFIZER(Purple Top)SARS-COV-2 Vaccination 08/17/2019, 09/07/2019, 04/25/2020   Pneumococcal Conjugate-13 10/20/2014   Pneumococcal Polysaccharide-23 12/29/2011   Td 01/19/2010    Conditions to be addressed/monitored:  Hypertension and Diabetes  Care Plan : [place]  Updates made by Debbora Dus, [facility] since 08/19/2021 12:00 AM     Problem: Disease Management   Priority: High  Onset Date: 01/25/2021  Note:   Current Barriers:  Home blood pressure elevated Fasting blood glucose elevated  Pharmacist Clinical Goal(s):  Patient will contact provider office for questions/concerns as evidenced notation of same in electronic health record through collaboration with PharmD and provider.   Interventions: 1:1 collaboration with Tonia Ghent, MD regarding development and update of comprehensive plan of care as evidenced by provider attestation and co-signature Inter-disciplinary care team collaboration (see longitudinal plan of care) Comprehensive medication review performed; medication list updated in electronic medical record  Hypertension (BP goal <130/80) -Query controlled - clinic BP within goal, but home readings elevated -Current treatment: Lisinopril 40 mg - 1 tablet daily Metoprolol tartrate 25 mg - 1/2 tablet twice daily Amlodipine 5 mg - 1 tablet daily -Medications previously  tried: HCTZ  -Current home readings: (arm cuff, checks in evening daily, rests  5 minutes before check, denies caffeine) 08/12/21 - 115/63 08/13/21 - 151/61 08/14/21 - 143/76 08/15/21 - 164/82 08/19/21 - 143/51 -Denies hypotensive/hypertensive symptoms -Recommended to continue current medication; Bring monitor and written log to PCP visit in 2 weeks.  Diabetes (Fructosamine < 317 or A1C < 7%) -Not ideally controlled, per fasting BG range (160-170). He checks BG daily before breakfast. Reports BG does not seem to vary depending of high carb meal. Elevations are random.  -Current medications: Lantus Solostar - Inject 90 units daily at bedtime (2 doses of 45 units) Metformin 500 mg - 1 tablet twice daily -Medications previously tried: glipizide, max dose metformin 1000 mg/day  -Current home glucose readings:  fasting: 138-205, mostly around 160-170  post prandial glucose: n/a -Denies hypoglycemic/hyperglycemic symptoms -Current meal patterns:   breakfast: fried eggs   dinner: wife cooks meat and vegetables  - he likes squash, green beans, peas, beans. Does not like leafy greens or other non-starchy vegetables. -Recommended to continue current medication; Please continue to monitor BG daily and bring log to PCP visit.  Patient Goals/Self-Care Activities Patient will:  - check glucose and blood pressure daily, document, and provide at future appointments  Follow Up Plan: Telephone follow up appointment with care management team member scheduled for:  - CCM visit 6 months    Medication Assistance:  Lantus obtained through [place] medication assistance program.  Enrollment ends 07/10/2022  Compliance/Adherence/Medication fill history: Care Gaps: Reviewed with patient  Annual eye exam - foot exam completed Nov 2023 Annual foot exam - pt reports he sees eye doctor every 3 months for cataracts/macular degeneration  Star-Rating Drugs: Medication:                Last Fill:         Day  Supply Atorvastatin 40 mg      08/10/2021      90         Lisinopril 40 mg           07/17/2021      90         Metformin 500 mg       07/05/2021      90         Lantus 100 units         07/14/2020      42  No gaps in fill history  Patient's preferred pharmacy is:  [facility], [place] - [address] [place] [address] Phone: [phone] Fax: [phone]  Uses pill box? Yes - Wife puts his medications in the pillbox - takes all medications at breakfast or bedtime  Pt endorses 100% compliance  We discussed: Current pharmacy is preferred with insurance plan and patient is satisfied with pharmacy services  Care Plan and Follow Up Patient Decision:  Patient agrees to Care Plan and Follow-up.  Debbora Dus, PharmD Clinical Pharmacist [place] [facility] [phone]

## 2021-08-19 NOTE — Patient Instructions (Signed)
Dear [name],  Below is a summary of the goals we discussed during our follow up appointment on August 19, 2021. Please contact me anytime with questions or concerns.   Visit Information  Patient Care Plan: CCM Pharmacy Care Plan     Problem Identified: Disease Management   Priority: High  Onset Date: 01/25/2021  Note:   Current Barriers:  Home blood pressure elevated Fasting blood glucose elevated  Pharmacist Clinical Goal(s):  Patient will contact provider office for questions/concerns as evidenced notation of same in electronic health record through collaboration with PharmD and provider.   Interventions: 1:1 collaboration with Joaquim Nam, MD regarding development and update of comprehensive plan of care as evidenced by provider attestation and co-signature Inter-disciplinary care team collaboration (see longitudinal plan of care) Comprehensive medication review performed; medication list updated in electronic medical record  Hypertension (BP goal <130/80) -Query controlled - clinic BP within goal, but home readings elevated -Current treatment: Lisinopril 40 mg - 1 tablet daily Metoprolol tartrate 25 mg - 1/2 tablet twice daily Amlodipine 5 mg - 1 tablet daily -Medications previously tried: HCTZ  -Current home readings: (arm cuff, checks in evening daily, rests 5 minutes before check, denies caffeine) 08/12/21 - 115/63 08/13/21 - 151/61 08/14/21 - 143/76 08/15/21 - 164/82 08/19/21 - 143/51 -Denies hypotensive/hypertensive symptoms -Recommended to continue current medication; Bring monitor and written log to PCP visit in 2 weeks.  Diabetes (Fructosamine < 317 or A1C < 7%) -Not ideally controlled, per fasting BG range (160-170). He checks BG daily before breakfast. Reports BG does not seem to vary depending of high carb meal. Elevations are random.  -Current medications: Lantus Solostar - Inject 90 units daily at bedtime (2 doses of 45 units) Metformin 500 mg - 1  tablet twice daily -Medications previously tried: glipizide, max dose metformin 1000 mg/day  -Current home glucose readings:  fasting: 138-205, mostly around 160-170  post prandial glucose: n/a -Denies hypoglycemic/hyperglycemic symptoms -Current meal patterns:   breakfast: fried eggs   dinner: wife cooks meat and vegetables  - he likes squash, green beans, peas, beans. Does not like leafy greens or other non-starchy vegetables. -Recommended to continue current medication; Please continue to monitor BG daily and bring log to PCP visit.  Patient Goals/Self-Care Activities Patient will:  - check glucose and blood pressure daily, document, and provide at future appointments  Follow Up Plan: Telephone follow up appointment with care management team member scheduled for:  - CCM visit 6 months      Patient verbalizes understanding of instructions and care plan provided today and agrees to view in MyChart. Active MyChart status confirmed with patient.    Phil Dopp, PharmD Clinical Pharmacist [facility] [phone]

## 2021-08-23 ENCOUNTER — Telehealth: Payer: Self-pay

## 2021-08-23 NOTE — Telephone Encounter (Signed)
I will give this to Lillia Abed to follow up on at this time.

## 2021-08-23 NOTE — Progress Notes (Signed)
° ° °  Chronic Care Management Pharmacy Assistant   Name: [name]  MRN: [id number] DOB: 1946/03/02  Reason for Encounter: CCM (Lantus 2023 Patient Assistance)   I called [employer] to inquire about patient's [id number] application for [id number]. [employer] has not received any paperwork. Please refax forms to [phone]. Please be sure to include patient's full name and date of birth on the fax cover sheet.   Debbora Dus, CPP notified  Marijean Niemann, [place] Clinical Pharmacy Assistant [phone]  Time Spent: 10 Minutes

## 2021-08-23 NOTE — Telephone Encounter (Signed)
Sent note to Anastasiya to see if she can re-fax forms.

## 2021-08-23 NOTE — Telephone Encounter (Signed)
My assistant called [employer] to inquire about patient's 2023 application for Lantus. [employer] has not received any paperwork. Please refax forms to [phone]. Please be sure to include patient's full name and date of birth on the fax cover sheet.

## 2021-08-24 NOTE — Telephone Encounter (Signed)
Re-faxed completed forms to [employer]: [phone].  Amy, please call [employer] in 3-5 days to check on status

## 2021-08-24 NOTE — Progress Notes (Signed)
I have added it to my schedule for 02/17 (Friday). I will call [employer] then.  Al Corpus, CPP notified  Claudina Lick, [place] Clinical Pharmacy Assistant [phone]

## 2021-09-01 NOTE — Progress Notes (Signed)
Spoke with [employer] in regards to Lantus application. [employer] stated a new application will need to be faxed as the application they have received is outdated (the version of the form is from 2020). 2022 version of the forms have been completed and uploaded.  Please refax the new application to [phone]. Please include patient's full name and date of birth on the fax cover sheet.   Al Corpus, CPP notified  Claudina Lick, [place] Clinical Pharmacy Assistant [phone]  Time Spent: 30 Minutes

## 2021-09-02 ENCOUNTER — Ambulatory Visit (INDEPENDENT_AMBULATORY_CARE_PROVIDER_SITE_OTHER): Payer: Medicare Other | Admitting: Vascular Surgery

## 2021-09-02 ENCOUNTER — Ambulatory Visit (INDEPENDENT_AMBULATORY_CARE_PROVIDER_SITE_OTHER): Payer: Medicare Other | Admitting: Family Medicine

## 2021-09-02 ENCOUNTER — Encounter (INDEPENDENT_AMBULATORY_CARE_PROVIDER_SITE_OTHER): Payer: Self-pay | Admitting: Vascular Surgery

## 2021-09-02 ENCOUNTER — Encounter: Payer: Self-pay | Admitting: Family Medicine

## 2021-09-02 ENCOUNTER — Ambulatory Visit (INDEPENDENT_AMBULATORY_CARE_PROVIDER_SITE_OTHER): Payer: Medicare Other

## 2021-09-02 ENCOUNTER — Other Ambulatory Visit: Payer: Self-pay

## 2021-09-02 ENCOUNTER — Other Ambulatory Visit (INDEPENDENT_AMBULATORY_CARE_PROVIDER_SITE_OTHER): Payer: Self-pay | Admitting: Vascular Surgery

## 2021-09-02 VITALS — BP 164/78 | HR 78 | Temp 97.3°F | Ht 68.0 in | Wt 198.0 lb

## 2021-09-02 VITALS — BP 116/73 | HR 73 | Ht 68.0 in | Wt 199.0 lb

## 2021-09-02 DIAGNOSIS — I6523 Occlusion and stenosis of bilateral carotid arteries: Secondary | ICD-10-CM

## 2021-09-02 DIAGNOSIS — I10 Essential (primary) hypertension: Secondary | ICD-10-CM

## 2021-09-02 DIAGNOSIS — I739 Peripheral vascular disease, unspecified: Secondary | ICD-10-CM

## 2021-09-02 DIAGNOSIS — I714 Abdominal aortic aneurysm, without rupture, unspecified: Secondary | ICD-10-CM | POA: Diagnosis not present

## 2021-09-02 DIAGNOSIS — I724 Aneurysm of artery of lower extremity: Secondary | ICD-10-CM

## 2021-09-02 DIAGNOSIS — I6522 Occlusion and stenosis of left carotid artery: Secondary | ICD-10-CM

## 2021-09-02 DIAGNOSIS — I25118 Atherosclerotic heart disease of native coronary artery with other forms of angina pectoris: Secondary | ICD-10-CM

## 2021-09-02 DIAGNOSIS — K625 Hemorrhage of anus and rectum: Secondary | ICD-10-CM | POA: Diagnosis not present

## 2021-09-02 DIAGNOSIS — E1151 Type 2 diabetes mellitus with diabetic peripheral angiopathy without gangrene: Secondary | ICD-10-CM | POA: Diagnosis not present

## 2021-09-02 DIAGNOSIS — E1169 Type 2 diabetes mellitus with other specified complication: Secondary | ICD-10-CM | POA: Diagnosis not present

## 2021-09-02 DIAGNOSIS — E785 Hyperlipidemia, unspecified: Secondary | ICD-10-CM

## 2021-09-02 LAB — BASIC METABOLIC PANEL
BUN: 14 mg/dL (ref 6–23)
CO2: 31 mEq/L (ref 19–32)
Calcium: 9.6 mg/dL (ref 8.4–10.5)
Chloride: 103 mEq/L (ref 96–112)
Creatinine, Ser: 1.19 mg/dL (ref 0.40–1.50)
GFR: 59.39 mL/min — ABNORMAL LOW (ref >60.00)
Glucose, Bld: 150 mg/dL — ABNORMAL HIGH (ref 70–99)
Potassium: 4.4 mEq/L (ref 3.5–5.1)
Sodium: 140 mEq/L (ref 135–145)

## 2021-09-02 LAB — HEMOGLOBIN A1C: Hgb A1c MFr Bld: 10 % — ABNORMAL HIGH (ref 4.6–6.5)

## 2021-09-02 MED ORDER — AMLODIPINE BESYLATE 5 MG PO TABS: 2.5000 mg | ORAL_TABLET | Freq: Every day | ORAL | Status: DC

## 2021-09-02 NOTE — Progress Notes (Signed)
MRN : [id number]  [name] is a 76 y.o. (June 20, 1946) male who presents with chief complaint of check my carotid arteries.  History of Present Illness: The patient is seen for follow up evaluation of carotid stenosis. The carotid stenosis followed by ultrasound.   The patient denies amaurosis fugax. There is no recent history of TIA symptoms or focal motor deficits. There is no prior documented CVA.  The patient is taking enteric-coated aspirin 81 mg daily.  There is no history of migraine headaches. There is no history of seizures.  The patient has a history of coronary artery disease, no recent episodes of angina or shortness of breath. The patient denies PAD or claudication symptoms. There is a history of hyperlipidemia which is being treated with a statin.    Carotid Duplex done today shows RICA 3-30% and LICA widely patent s/p CEA.  No change compared to last study in 07/2020   Current Meds  Medication Sig   acetaminophen (TYLENOL) 325 MG tablet Take 1-2 tablets (325-650 mg total) by mouth every 4 (four) hours as needed for mild pain (or temp >/= 101 F).   albuterol (PROAIR HFA) 108 (90 BASE) MCG/ACT inhaler Inhale 2 puffs into the lungs 3 (three) times daily as needed (cough).   amLODipine (NORVASC) 5 MG tablet Take 0.5 tablets (2.5 mg total) by mouth daily.   aspirin EC 81 MG tablet Take 81 mg by mouth daily. Swallow whole.   atorvastatin (LIPITOR) 40 MG tablet TAKE 1 TABLET(40 MG) BY MOUTH DAILY   blood glucose meter kit and supplies KIT 1 each by Does not apply route daily as needed. Dispense based on patient and insurance preference. Use up to four times daily as directed. (FOR ICD-9 250.00, 250.01).   EPINEPHrine 0.3 mg/0.3 mL IJ SOAJ injection SMARTSIG:0.3 Milliliter(s) IM Once PRN   famotidine (PEPCID) 20 MG tablet Take 1 tablet (20 mg total) by mouth 2 (two) times daily.   fenofibrate (TRICOR) 145 MG tablet TAKE 1 TABLET(145 MG) BY MOUTH DAILY    fluticasone-salmeterol (ADVAIR DISKUS) 250-50 MCG/ACT AEPB Inhale 1 puff into the lungs in the morning and at bedtime.   glucosamine-chondroitin 500-400 MG tablet Take 1 tablet by mouth 2 (two) times daily.   glucose blood (ONETOUCH ULTRA) test strip CHECK SUGAR TWICE DAILY   insulin glargine (LANTUS SOLOSTAR) 100 UNIT/ML Solostar Pen ADMINISTER 90-100 UNITS UNDER THE SKIN DAILY   Insulin Pen Needle (B-D UF III MINI PEN NEEDLES) 31G X 5 MM MISC Use as instructed to inject insulin.  Diagnosis:  E11.9  Insulin dependent.   Lancets (ONETOUCH ULTRASOFT) lancets Use as instructed to check sugar, dx E11.9.   lisinopril (ZESTRIL) 40 MG tablet Take 1 tablet (40 mg total) by mouth daily.   meclizine (ANTIVERT) 25 MG tablet Take 25 mg by mouth every 6 (six) hours as needed. For dizziness/vertigo   metFORMIN (GLUCOPHAGE) 500 MG tablet Take 1 tablet (500 mg total) by mouth 2 (two) times daily with a meal.   metoprolol tartrate (LOPRESSOR) 25 MG tablet Take 0.5 tablets (12.5 mg total) by mouth 2 (two) times daily.   Multiple Vitamins-Minerals (PRESERVISION AREDS 2 PO) Take 1 tablet by mouth 2 (two) times daily.   nitroGLYCERIN (NITROSTAT) 0.4 MG SL tablet Place 1 tablet (0.4 mg total) under the tongue every 5 (five) minutes as needed. For chest pain    Past Medical History:  Diagnosis Date   CAD (coronary artery disease)    a. 1999: PCI-->LAD 2/2  MI; b. 2005: inf MI s/p PCI/DES x 4 to RCA; c. Myoview in 12/2005: EF 61%, no evidence for ischemia; d. cath 10/2014: occluded mLAD w/ L to L and L to R collats, dRCA 95% s/p PCI/DES 0%, mPDA 70%, LCx mild to mod irregs, procedure complicated by inf ST ele in recovery, repeat cath showed acute dRCA stent thrombosis o/w occluded mRPDA, PTCA dRCA, PCI/DES RPDA, aggrastat x 18 hr   CKD (chronic kidney disease)    COPD (chronic obstructive pulmonary disease) (HCC)    DM2 (diabetes mellitus, type 2) ([place])    H/O hiatal hernia    Heart attack ([place]) [phone]   had 2  in one day(released plaque during angio). 5 stents total   HTN (hypertension)    Hypercholesterolemia    Impaired fasting glucose    Elevated after steroid injection   Macular degeneration of right eye 2020   receiving injections into eye   Osteoarthritis of hip    injections in both hips   Osteoarthritis, knee    PAD (peripheral artery disease) (HCC)    L fem to below the knee popliteal vein bypass graft   Peri-rectal abscess 12/27/2011   Tobacco abuse    Prior    Past Surgical History:  Procedure Laterality Date   [place], 2006, 2016 x 2   Multiple, LAD in 1999, RCA in 2005.  has a total of 4 stents   ENDARTERECTOMY Left 09/21/2018   Procedure: ENDARTERECTOMY CAROTID;  Surgeon: Katha Cabal, MD;  Location: [facility];  Service: Vascular;  Laterality: Left;   FEMORAL-POPLITEAL BYPASS GRAFT  2016   L fem to below the knee popliteal vein bypass graft   INCISE AND DRAIN ABCESS  2007   on scrotum   INCISION AND DRAINAGE PERIRECTAL ABSCESS  12/28/2011   Procedure: [place];  Surgeon: Luella Cook III, MD;  Location: [facility];  Service: General;  Laterality: N/A;    Social History Social History   Tobacco Use   Smoking status: Former    Packs/day: 1.00    Years: 40.00    Pack years: 40.00    Types: Cigarettes    Quit date: 10/21/1998    Years since quitting: 22.8   Smokeless tobacco: Never   Tobacco comments:    Past smoker, states he will smoke maybe 1 cigarette/ month or so still  Vaping Use   Vaping Use: Never used  Substance Use Topics   Alcohol use: Not Currently    Alcohol/week: 0.0 standard drinks    Comment: Occasional beer on the weekends   Drug use: No    Family History Family History  Problem Relation Age of Onset   Heart failure Father        CHF   CAD Mother    Diabetes Mother    Colon cancer Neg Hx    Prostate cancer Neg Hx     Allergies  Allergen Reactions    Augmentin [Amoxicillin-Pot Clavulanate] Other (See Comments)    Nausea,vomiting,diarrhea   Morphine And Related Nausea And Vomiting    Vomiting, GI upset   Metformin And Related Other (See Comments)    Intolerant of 2019m a day.      REVIEW OF SYSTEMS (Negative unless checked)  Constitutional: [] Weight loss  [] Fever  [] Chills Cardiac: [] Chest pain   [] Chest pressure   [] Palpitations   [] Shortness of breath when laying flat   [] Shortness of breath  with exertion. Vascular:  [] Pain in legs with walking   [] Pain in legs at rest  [] History of DVT   [] Phlebitis   [] Swelling in legs   [] Varicose veins   [] Non-healing ulcers Pulmonary:   [] Uses home oxygen   [] Productive cough   [] Hemoptysis   [] Wheeze  [] COPD   [] Asthma Neurologic:  [] Dizziness   [] Seizures   [] History of stroke   [] History of TIA  [] Aphasia   [] Vissual changes   [] Weakness or numbness in arm   [] Weakness or numbness in leg Musculoskeletal:   [] Joint swelling   [] Joint pain   [] Low back pain Hematologic:  [] Easy bruising  [] Easy bleeding   [] Hypercoagulable state   [] Anemic Gastrointestinal:  [] Diarrhea   [] Vomiting  [] Gastroesophageal reflux/heartburn   [] Difficulty swallowing. Genitourinary:  [] Chronic kidney disease   [] Difficult urination  [] Frequent urination   [] Blood in urine Skin:  [] Rashes   [] Ulcers  Psychological:  [] History of anxiety   []  History of major depression.  Physical Examination  Vitals:   09/02/21 1450  BP: 116/73  Pulse: 73  Weight: 199 lb (90.3 kg)  Height: 5' 8"  (1.727 m)   Body mass index is 30.26 kg/m. Gen: WD/WN, NAD Head: [place]/AT, No temporalis wasting.  Ear/Nose/Throat: Hearing grossly intact, nares w/o erythema or drainage Eyes: PER, EOMI, sclera nonicteric.  Neck: Supple, no masses.  No bruit or JVD.  Pulmonary:  Good air movement, no audible wheezing, no use of accessory muscles.  Cardiac: RRR, normal S1, S2, no Murmurs. Vascular:   well healed left CEA incisional scar Vessel Right  Left  Radial Palpable Palpable  Carotid Palpable Palpable  Gastrointestinal: soft, non-distended. No guarding/no peritoneal signs.  Musculoskeletal: M/S 5/5 throughout.  No visible deformity.  Neurologic: CN 2-12 intact. Pain and light touch intact in extremities.  Symmetrical.  Speech is fluent. Motor exam as listed above. Psychiatric: Judgment intact, Mood & affect appropriate for pt's clinical situation. Dermatologic: No rashes or ulcers noted.  No changes consistent with cellulitis.   CBC Lab Results  Component Value Date   WBC 7.8 05/05/2021   HGB 14.3 05/06/2021   HCT 41.2 05/05/2021   MCV 91.9 05/05/2021   PLT 206 05/05/2021    BMET    Component Value Date/Time   NA 141 05/21/2021 0819   NA 134 (L) 10/29/2014 0908   K 4.3 05/21/2021 0819   K 4.3 10/29/2014 0908   CL 104 05/21/2021 0819   CL 103 10/29/2014 0908   CO2 29 05/21/2021 0819   CO2 24 10/29/2014 0908   GLUCOSE 137 (H) 05/21/2021 0819   GLUCOSE 142 (H) 10/29/2014 0908   BUN 18 05/21/2021 0819   BUN 20 10/29/2014 0908   CREATININE 1.17 05/21/2021 0819   CREATININE 1.32 (H) 10/29/2014 0908   CREATININE 0.96 01/28/2011 0855   CALCIUM 9.9 05/21/2021 0819   CALCIUM 9.3 10/29/2014 0908   GFRNONAA >60 05/05/2021 1351   GFRNONAA 55 (L) 10/29/2014 0908   GFRAA 56 (L) 11/22/2019 0952   GFRAA >60 10/29/2014 0908   CrCl cannot be calculated (Patient's most recent lab result is older than the maximum 21 days allowed.).  COAG Lab Results  Component Value Date   INR 1.0 05/05/2021   INR 1.0 09/14/2018   INR 0.9 10/24/2014    Radiology No results found.   Assessment/Plan 1. Carotid artery stenosis without cerebral infarction, left Recommend:   Given the patient's asymptomatic subcritical stenosis no further invasive testing or surgery at this time.  Duplex ultrasound shows RICA <91% and LICA 47-82% stenosis s/p left CEA.   Continue antiplatelet therapy as prescribed Continue management of CAD, HTN  and Hyperlipidemia Healthy heart diet,  encouraged exercise at least 4 times per week Follow up in 12 months with duplex ultrasound and physical exam    2. Abdominal aortic aneurysm (AAA) without rupture, unspecified part No surgery or intervention at this time. The patient has an asymptomatic abdominal aortic aneurysm that is less than 4 cm in maximal diameter.  I have discussed the natural history of abdominal aortic aneurysm and the small risk of rupture for aneurysm less than 5 cm in size.  However, as these small aneurysms tend to enlarge over time, continued surveillance with ultrasound or CT scan is mandatory.  I have also discussed optimizing medical management with hypertension and lipid control and the importance of abstinence from tobacco.  The patient is also encouraged to exercise a minimum of 30 minutes 4 times a week.  Should the patient develop new onset abdominal or back pain or signs of peripheral embolization they are instructed to seek medical attention immediately and to alert the physician providing care that they have an aneurysm.  The patient voices their understanding. The patient will return in 12 months with an aortic duplex.   3. PVD (peripheral vascular disease) ([place]) Recommend:   The patient has evidence of atherosclerosis of the lower extremities with claudication.  The patient does not voice lifestyle limiting changes at this point in time.   Noninvasive studies do not suggest clinically significant change.   No invasive studies, angiography or surgery at this time The patient should continue walking and begin a more formal exercise program.  The patient should continue antiplatelet therapy and aggressive treatment of the lipid abnormalities   No changes in the patient's medications at this time  4. Popliteal artery aneurysm (HCC) No surgery or intervention at this time.  The patient has an asymptomatic popliteal artery aneurysm that is less than 2.5 cm in  maximal diameter on the right and is s/p bypass on the left.  I have discussed the natural history of popliteal aneurysm and the small risk of thrombosis for aneurysm less than 2.5 cm in size.  However, as these small aneurysms tend to enlarge over time, continued surveillance with ultrasound is mandatory.   I have also discussed optimizing medical management with hypertension and lipid control and the importance of abstinence from tobacco.  The patient is also encouraged to exercise a minimum of 30 minutes 4 times a week.   Should the patient develop new leg pain or signs of peripheral embolization they are instructed to seek medical attention immediately and to alert the physician providing care that they have an aneurysm.  The patient voices their understanding.   5. Essential hypertension Continue antihypertensive medications as already ordered, these medications have been reviewed and there are no changes at this time.   6. Coronary artery disease of native artery of native heart with stable angina pectoris (HCC) Continue cardiac and antihypertensive medications as already ordered and reviewed, no changes at this time.  Continue statin as ordered and reviewed, no changes at this time  Nitrates PRN for chest pain     Hortencia Pilar, MD  09/02/2021 3:04 PM

## 2021-09-02 NOTE — Patient Instructions (Addendum)
Check to see if you are still taking amlodipine.  If not then start taking 2.5mg  a day (1/2 of a 5mg  a day).  Let me know about your BP in about 1 week.  Go to the lab on the way out.   If you have mychart we'll likely use that to update you.    Take care.  Glad to see you.

## 2021-09-02 NOTE — Telephone Encounter (Signed)
Updated application for 2023. Faxed to [employer]. Will follow up in 5-7 business days

## 2021-09-02 NOTE — Progress Notes (Signed)
This visit occurred during the SARS-CoV-2 public health emergency.  Safety protocols were in place, including screening questions prior to the visit, additional usage of staff PPE, and extensive cleaning of exam room while observing appropriate contact time as indicated for disinfecting solutions.  Diabetes:  Using medications without difficulties: yes, metformin and 90 units insulin.   Hypoglycemic episodes: no Hyperglycemic episodes:no Feet problems:no Blood Sugars averaging: <150 for the last few weeks.  Prior to that it was more variable.   eye exam within last year:  pending, he is scheduled.   Labs pending.   He has h/o likely artifical elevation of A1c compared to fructosamine.  D/w pt.  See notes on labs.    Hypertension:    Using medication without problems or lightheadedness: yes Chest pain with exertion:no Edema: some L leg edema at baseline, old finding.   Short of breath:no Average home BPs: usually 140-160s/60-70s   His BP cuff correlates with ours at the OV today.   He may not be on amlodipine, d/w pt.    He had prev BRBPR with GI eval pending and is off brilinta in the meantime.  No more blood in stool.  We talked about it and we agreed that I would check about brilinta restart with Dr. Mariah Milling and see about GI f/u.    I told him I would check with pharmacy staff re: inhaler cost.  It was prohibitively expensive.    PMH and SH reviewed  Meds, vitals, and allergies reviewed.   ROS: Per HPI unless specifically indicated in ROS section   GEN: nad, alert and oriented HEENT: ncat NECK: supple w/o LA CV: rrr. PULM: scant exp wheeze B but o/w ctab, no inc wob ABD: soft, +bs EXT: no edema SKIN: well perfused.

## 2021-09-03 NOTE — Progress Notes (Addendum)
I have added this to my schedule for 09/10/2021. °

## 2021-09-06 LAB — FRUCTOSAMINE: Fructosamine: 267 umol/L (ref 205–285)

## 2021-09-07 DIAGNOSIS — E119 Type 2 diabetes mellitus without complications: Secondary | ICD-10-CM

## 2021-09-07 DIAGNOSIS — I10 Essential (primary) hypertension: Secondary | ICD-10-CM

## 2021-09-08 ENCOUNTER — Telehealth: Payer: Self-pay | Admitting: Family Medicine

## 2021-09-08 DIAGNOSIS — J432 Centrilobular emphysema: Secondary | ICD-10-CM

## 2021-09-08 NOTE — Assessment & Plan Note (Signed)
History of. ?He had prev BRBPR with GI eval pending and is off brilinta in the meantime.  No more blood in stool.  We talked about it and we agreed that I would check about brilinta restart with Dr. Mariah Milling and see about GI f/u.   ?

## 2021-09-08 NOTE — Assessment & Plan Note (Signed)
Likely with A1c versus fructosamine discrepancy.  See notes on labs.  We can set follow-up after I see his labs. ?

## 2021-09-08 NOTE — Telephone Encounter (Signed)
I need your input on Brilinta use.  He had previous bright red blood per rectum but not since he has stopped the medication.  I put in another referral for him to see GI but I would like your advice regarding potential Brilinta restart.  Thanks. ?

## 2021-09-08 NOTE — Telephone Encounter (Signed)
I told him I would check with you re: inhaler cost.  It was prohibitively expensive for patient.  Is there any way to get him any help?  Many thanks. ?

## 2021-09-08 NOTE — Assessment & Plan Note (Signed)
I asked him to check to see if he was still taking amlodipine.   If not then start taking 2.5mg  a day (1/2 of a 5mg  a day).  I asked him to let me know about his BP in about 1 week.  ?

## 2021-09-09 NOTE — Telephone Encounter (Signed)
Patient is prescribed Advair which is made by [employer]. Patient will not qualify for this pt assistance program until he spends $600 on Rx drugs in 2023. ? ?If we switch to Symbicort he will likely qualify for [place]&Me program. ?

## 2021-09-10 MED ORDER — BUDESONIDE-FORMOTEROL FUMARATE 160-4.5 MCG/ACT IN AERO: 2.0000 | INHALATION_SPRAY | Freq: Two times a day (BID) | RESPIRATORY_TRACT | 3 refills | Status: DC

## 2021-09-10 NOTE — Telephone Encounter (Signed)
Patient successfully enrolled in AZ&Me assistance program via online portal for Symbicort. Updated medication list and ordered Symbicort Rx to [facility] pharmacy. ? ?Discussed inhaler change with patient. He voiced understanding to use Symbicort 2 puffs twice daily and contact us with any issues. ? ? ?

## 2021-09-10 NOTE — Addendum Note (Signed)
Addended by: Charlton Haws on: 09/10/2021 02:06 PM ? ? Modules accepted: Orders ? ?

## 2021-09-10 NOTE — Telephone Encounter (Signed)
I am fine with the change to Symbicort and I appreciate your help.  Please let me know what I need to do. ?

## 2021-09-12 NOTE — Telephone Encounter (Signed)
Antonieta Iba, MD  You; Maple Hudson, RN 8 hours ago (11:21 AM)  ? ?Mandie, perhaps we could suggest to him following  ?Perhaps we could stop the aspirin and Brilinta  ?And transition to Plavix 75 mg daily along with aspirin  ?It has been many years since his last intervention  ?Thx  ?TG   ? ?=============== ?Many thanks.  I appreciate the help of all involved. ?

## 2021-09-12 NOTE — Telephone Encounter (Signed)
Many thanks. 

## 2021-09-13 ENCOUNTER — Telehealth: Payer: Self-pay

## 2021-09-13 MED ORDER — CLOPIDOGREL BISULFATE 75 MG PO TABS: 75.0000 mg | ORAL_TABLET | Freq: Every day | ORAL | 3 refills | Status: DC

## 2021-09-13 NOTE — Telephone Encounter (Signed)
Was able to reach out to Mr. Charleen Kirks regarding Dr. Windell Hummingbird request, reviewed recommendations from Dr. Mariah Milling and pt's PCP Dr. Para March ? ?Perhaps we could stop the aspirin and Brilinta  ?And transition to Plavix 75 mg daily along with aspirin  ?It has been many years since his last intervention  ?Thx  ?TG  ? ?Mr Tellefsen verbalized understanding, he will stop Brilinta and start Plavix 75 mg daily along w/OTC ASA 81 mg qd.  ? ?Plavix sent to pharmacy, med list updated. Otherwise all questions were address and no additional concerns at this time. Mr. Massmann thankful for the call and advice. Agreeable to plan, will call back for anything further.   ? ?

## 2021-09-13 NOTE — Addendum Note (Signed)
Addended by: Wynema Birch on: 09/13/2021 08:43 AM ? ? Modules accepted: Orders ? ?

## 2021-09-13 NOTE — Telephone Encounter (Signed)
Patient stated he will call us if he schedules with [place] and wanted to be off the call list ?

## 2021-09-17 DIAGNOSIS — H2513 Age-related nuclear cataract, bilateral: Secondary | ICD-10-CM | POA: Diagnosis not present

## 2021-09-17 DIAGNOSIS — H25013 Cortical age-related cataract, bilateral: Secondary | ICD-10-CM | POA: Diagnosis not present

## 2021-09-17 DIAGNOSIS — H18413 Arcus senilis, bilateral: Secondary | ICD-10-CM | POA: Diagnosis not present

## 2021-09-17 DIAGNOSIS — H353134 Nonexudative age-related macular degeneration, bilateral, advanced atrophic with subfoveal involvement: Secondary | ICD-10-CM | POA: Diagnosis not present

## 2021-09-20 ENCOUNTER — Telehealth: Payer: Self-pay | Admitting: Cardiovascular Disease

## 2021-09-20 NOTE — Telephone Encounter (Signed)
? ?  Pre-operative Risk Assessment  ?  ?Patient Name: [name]  ?DOB: 1946-04-17 ?MRN: [id number]  ? ?  ? ?Request for Surgical Clearance   ? ?Procedure:   Cataract extraction w/ intraocular lens implantation of right eye followed by left eye  ? ?Date of Surgery:  Clearance 12/13/21                              ?   ?Surgeon:  Dr Mia Creek ?Surgeon's Group or Practice Name:  [facility]  ?Phone number:  [phone] ?Fax number:  [phone] ?  ?Type of Clearance Requested:   ?- Medical  ?  ?Type of Anesthesia:  Not Indicated ?  ?Additional requests/questions:   ? ?Signed, ?Morrie Sheldon Gerringer   ?09/20/2021, 10:59 AM  ? ?

## 2021-09-22 NOTE — Telephone Encounter (Signed)
? ?  Patient Name: [name]  ?DOB: 1946/05/14 ?MRN: [id number] ? ?Primary Cardiologist: Julien Nordmann, MD ? ?Chart reviewed as part of pre-operative protocol coverage. Cataract extractions are recognized in guidelines as low risk surgeries that do not typically require specific preoperative testing or holding of blood thinner therapy. Therefore, given past medical history and time since last visit, based on ACC/AHA guidelines, [name] would be at acceptable risk for the planned procedure without further cardiovascular testing.  ? ?I will route this recommendation to the requesting party via Epic fax function and remove from pre-op pool. ? ?Please call with questions. ? ?Azalee Course, [place] ?09/22/2021, 11:25 AM ? ?

## 2021-09-29 ENCOUNTER — Telehealth: Payer: Self-pay | Admitting: Pharmacist

## 2021-09-29 NOTE — Telephone Encounter (Signed)
[employer] application for Lantus was re-done with 2023 forms and faxed to manufacturer on 09/02/21. I have not received any faxes or information from [employer] about the status of the application. ? ?

## 2021-10-04 ENCOUNTER — Other Ambulatory Visit: Payer: Self-pay | Admitting: Family Medicine

## 2021-10-07 ENCOUNTER — Telehealth: Payer: Self-pay

## 2021-10-07 NOTE — Progress Notes (Signed)
Patient has been approved for Lantus patient assistance with [employer] for 2023. Assistance is through 07/10/2022. ? ?Al Corpus, CPP notified ? ?Claudina Lick, RMA ?Clinical Pharmacy Assistant ?[phone] ? ? ?

## 2021-10-07 NOTE — Progress Notes (Signed)
? ? ?  Chronic Care Management ?Pharmacy Assistant  ? ?Name: HERSON PRICHARD  MRN: [id number] DOB: 1946/02/22 ? ?Reason for Encounter: CCM (Lantus 2023 PAP Approved) ?  ?Patient has been approved for Lantus patient assistance with [employer] for 2023. Assistance is through 07/10/2022. ? ?Al Corpus, CPP notified ? ?Claudina Lick, RMA ?Clinical Pharmacy Assistant ?[phone] ? ? ?

## 2021-10-07 NOTE — Progress Notes (Signed)
Contacted [employer] and order was received for Lantus 09/13/21. Processing next followed by delivery . ? ?Al Corpus, CPP notified ? ?Lamae Fosco, CCMA ?Health concierge  ?[phone]  ?

## 2021-11-17 ENCOUNTER — Telehealth: Payer: Self-pay

## 2021-11-17 NOTE — Progress Notes (Signed)
Chronic Care Management Pharmacy Assistant   Name: [name]  MRN: [id number] DOB: [birth year]  Reason for Encounter: CCM (Diabetes Disease State)   Recent office visits:  09/08/21 Elsie Stain, MD Change from Advair to Symbicort ([place] &[place] ME PAP); Stop: Brilinta. Start Plavix 75 mg.  09/02/21 Elsie Stain, MD [facility] Referral to [facility]. Change: Amlodipine 2.5 mg vs. 5 mg.  Recent consult visits:  09/02/21 Hortencia Pilar, MD (Vascular Surgery): Carotid artery stenosis. Procedure: Carotid Duplex  Hospital visits:  None in previous 6 months  Medications: Outpatient Encounter Medications as of 11/17/2021  Medication Sig   acetaminophen (TYLENOL) 325 MG tablet Take 1-2 tablets (325-650 mg total) by mouth every 4 (four) hours as needed for mild pain (or temp >/= 101 F).   albuterol (PROAIR HFA) 108 (90 BASE) MCG/ACT inhaler Inhale 2 puffs into the lungs 3 (three) times daily as needed (cough).   amLODipine (NORVASC) 5 MG tablet Take 0.5 tablets (2.5 mg total) by mouth daily.   aspirin EC 81 MG tablet Take 81 mg by mouth daily. Swallow whole.   atorvastatin (LIPITOR) 40 MG tablet TAKE 1 TABLET(40 MG) BY MOUTH DAILY   blood glucose meter kit and supplies KIT 1 each by Does not apply route daily as needed. Dispense based on patient and insurance preference. Use up to four times daily as directed. (FOR ICD-9 250.00, 250.01).   budesonide-formoterol (SYMBICORT) 160-4.5 MCG/ACT inhaler Inhale 2 puffs into the lungs 2 (two) times daily.   clopidogrel (PLAVIX) 75 MG tablet Take 1 tablet (75 mg total) by mouth daily.   EPINEPHrine 0.3 mg/0.3 mL IJ SOAJ injection SMARTSIG:0.3 Milliliter(s) IM Once PRN   famotidine (PEPCID) 20 MG tablet Take 1 tablet (20 mg total) by mouth 2 (two) times daily.   fenofibrate (TRICOR) 145 MG tablet TAKE 1 TABLET(145 MG) BY MOUTH DAILY   glucosamine-chondroitin 500-400 MG tablet Take 1 tablet by mouth 2 (two) times daily.   glucose blood (ONETOUCH ULTRA) test  strip CHECK SUGAR TWICE DAILY   insulin glargine (LANTUS SOLOSTAR) 100 UNIT/ML Solostar Pen ADMINISTER 90-100 UNITS UNDER THE SKIN DAILY   Insulin Pen Needle (B-D UF III MINI PEN NEEDLES) 31G X 5 MM MISC Use as instructed to inject insulin.  Diagnosis:  E11.9  Insulin dependent.   Lancets (ONETOUCH ULTRASOFT) lancets Use as instructed to check sugar, dx E11.9.   lisinopril (ZESTRIL) 40 MG tablet Take 1 tablet (40 mg total) by mouth daily.   meclizine (ANTIVERT) 25 MG tablet Take 25 mg by mouth every 6 (six) hours as needed. For dizziness/vertigo   metFORMIN (GLUCOPHAGE) 500 MG tablet TAKE 1 TABLET(500 MG) BY MOUTH TWICE DAILY WITH A MEAL   metoprolol tartrate (LOPRESSOR) 25 MG tablet Take 0.5 tablets (12.5 mg total) by mouth 2 (two) times daily.   Multiple Vitamins-Minerals (PRESERVISION AREDS 2 PO) Take 1 tablet by mouth 2 (two) times daily.   nitroGLYCERIN (NITROSTAT) 0.4 MG SL tablet Place 1 tablet (0.4 mg total) under the tongue every 5 (five) minutes as needed. For chest pain   No facility-administered encounter medications on file as of 11/17/2021.   Recent Relevant Labs: Lab Results  Component Value Date/Time   HGBA1C 10.0 (H) 09/02/2021 09:49 AM   HGBA1C 9.6 (H) 05/21/2021 08:19 AM   HGBA1C 7.1 (H) 10/25/2014 03:02 AM    Kidney Function Lab Results  Component Value Date/Time   CREATININE 1.19 09/02/2021 09:49 AM   CREATININE 1.17 05/21/2021 08:19 AM   CREATININE 1.32 (H)  10/29/2014 09:08 AM   CREATININE 1.40 (H) 10/28/2014 02:45 AM   CREATININE 0.96 01/28/2011 08:55 AM   CREATININE 1.16 01/07/2011 08:35 AM   GFR 59.39 (L) 09/02/2021 09:49 AM   GFRNONAA >60 05/05/2021 01:51 PM   GFRNONAA 55 (L) 10/29/2014 09:08 AM   GFRAA 56 (L) 11/22/2019 09:52 AM   GFRAA >60 10/29/2014 09:08 AM     Attempted contact with patient 3 times on 05/10, 05/16 and 5/17. Unsuccessful outreach. Will atttempt contact next month.  Current antihyperglycemic regimen:  Lantus Solostar - Inject 90  units daily at bedtime (2 doses of 45 units) Metformin 500 mg - 1 tablet twice daily    Adherence Review: Is the patient currently on a STATIN medication? Yes Is the patient currently on ACE/ARB medication? Yes Does the patient have >5 day gap between last estimated fill dates? No  Care Gaps: Annual wellness visit in last year? Yes 02/09/2021 Most recent A1C reading: 10.0 on 09/02/2021 Most Recent BP reading: 116/73 on 09/02/21  Last eye exam / retinopathy screening: 08/11/2020 Last diabetic foot exam: Up to date  Star Rating Drugs:  Medication:  Last Fill: Day Supply Metformin 500 mg 10/04/2021 90 Atorvastatin 40 mg 11/13/2021 90 Lisinopril 40 mg 10/25/2021 90   CCM appointment on 03/01/2022  Charlene Brooke, CPP notified  Marijean Niemann, [place] Assistant [phone]

## 2021-12-13 DIAGNOSIS — H2511 Age-related nuclear cataract, right eye: Secondary | ICD-10-CM | POA: Diagnosis not present

## 2021-12-14 DIAGNOSIS — H2512 Age-related nuclear cataract, left eye: Secondary | ICD-10-CM | POA: Diagnosis not present

## 2021-12-27 DIAGNOSIS — H2512 Age-related nuclear cataract, left eye: Secondary | ICD-10-CM | POA: Diagnosis not present

## 2022-01-05 DIAGNOSIS — H353221 Exudative age-related macular degeneration, left eye, with active choroidal neovascularization: Secondary | ICD-10-CM | POA: Diagnosis not present

## 2022-01-05 DIAGNOSIS — H353213 Exudative age-related macular degeneration, right eye, with inactive scar: Secondary | ICD-10-CM | POA: Diagnosis not present

## 2022-01-05 DIAGNOSIS — H348312 Tributary (branch) retinal vein occlusion, right eye, stable: Secondary | ICD-10-CM | POA: Diagnosis not present

## 2022-01-05 DIAGNOSIS — E119 Type 2 diabetes mellitus without complications: Secondary | ICD-10-CM | POA: Diagnosis not present

## 2022-01-06 DIAGNOSIS — H353221 Exudative age-related macular degeneration, left eye, with active choroidal neovascularization: Secondary | ICD-10-CM | POA: Diagnosis not present

## 2022-02-04 DIAGNOSIS — E119 Type 2 diabetes mellitus without complications: Secondary | ICD-10-CM | POA: Diagnosis not present

## 2022-02-04 DIAGNOSIS — H353213 Exudative age-related macular degeneration, right eye, with inactive scar: Secondary | ICD-10-CM | POA: Diagnosis not present

## 2022-02-04 DIAGNOSIS — H353221 Exudative age-related macular degeneration, left eye, with active choroidal neovascularization: Secondary | ICD-10-CM | POA: Diagnosis not present

## 2022-02-04 DIAGNOSIS — H43813 Vitreous degeneration, bilateral: Secondary | ICD-10-CM | POA: Diagnosis not present

## 2022-02-16 ENCOUNTER — Other Ambulatory Visit (INDEPENDENT_AMBULATORY_CARE_PROVIDER_SITE_OTHER): Payer: Self-pay | Admitting: Nurse Practitioner

## 2022-02-16 DIAGNOSIS — Z9889 Other specified postprocedural states: Secondary | ICD-10-CM

## 2022-02-16 DIAGNOSIS — I724 Aneurysm of artery of lower extremity: Secondary | ICD-10-CM

## 2022-02-16 DIAGNOSIS — I714 Abdominal aortic aneurysm, without rupture, unspecified: Secondary | ICD-10-CM

## 2022-02-16 NOTE — Progress Notes (Signed)
MRN : [id number]  [name] is a 76 y.o. ([birth year]) male who presents with chief complaint of check circulation.  History of Present Illness:   The patient returns to the office for followup and review of the noninvasive studies. There have been no interval changes in lower extremity symptoms. No interval shortening of the patient's claudication distance or development of rest pain symptoms. No new ulcers or wounds have occurred since the last visit.   He is also followed for carotid stenosis.  He is s/p left CEA on 09/21/2018.  The carotid stenosis followed by ultrasound.    The patient denies amaurosis fugax. There is no recent history of TIA symptoms or focal motor deficits. There is no prior documented CVA.   The patient is taking enteric-coated aspirin 81 mg daily.   The patient is also followed for  a known abdominal aortic aneurysm. Patient denies abdominal pain or back pain, no other abdominal complaints. No changes suggesting embolic episodes.    There have been no significant changes to the patient's overall health care.   The patient denies history of DVT, PE or superficial thrombophlebitis. The patient denies recent episodes of angina or shortness of breath.    ABI Rt=1.08 and Lt=1.03  (previous ABI's Rt=1.12 and Lt=1.09 )   Previous carotid duplex done today shows RICA 1-39% and LICA widely patent s/p CEA.  No change compared to last study in 07/2020 . The bilateral vertebral arteries demonstrate antegrade flow with normal flow hemodynamics in the bilateral subclavian arteries.  Abdominal aortic duplex dated 02/17/2022 shows an AAA that measures 2.8 cm.  Duplex ultrasound of the bilateral lower extremities shows patent right without hemodynamically significant stenosis, on the left there is moderate stenosis of the origin of the left profunda femoris artery but the bypass is widely patent.  Rt popliteal artery measures 1.01 cm  No outpatient  medications have been marked as taking for the 02/17/22 encounter (Appointment) with Gilda Crease, Latina Craver, MD.    Past Medical History:  Diagnosis Date   CAD (coronary artery disease)    a. 1999: PCI-->LAD 2/2 MI; b. 2005: inf MI s/p PCI/DES x 4 to RCA; c. Myoview in 12/2005: EF 61%, no evidence for ischemia; d. cath 10/2014: occluded mLAD w/ L to L and L to R collats, dRCA 95% s/p PCI/DES 0%, mPDA 70%, LCx mild to mod irregs, procedure complicated by inf ST ele in recovery, repeat cath showed acute dRCA stent thrombosis o/w occluded mRPDA, PTCA dRCA, PCI/DES RPDA, aggrastat x 18 hr   CKD (chronic kidney disease)    COPD (chronic obstructive pulmonary disease) (HCC)    DM2 (diabetes mellitus, type 2) (HCC)    H/O hiatal hernia    Heart attack (HCC) [phone]   had 2 in one day(released plaque during angio). 5 stents total   HTN (hypertension)    Hypercholesterolemia    Impaired fasting glucose    Elevated after steroid injection   Macular degeneration of right eye 2020   receiving injections into eye   Osteoarthritis of hip    injections in both hips   Osteoarthritis, knee    PAD (peripheral artery disease) (HCC)    L fem to below the knee popliteal vein bypass graft   Peri-rectal abscess 12/27/2011   Tobacco abuse    Prior    Past Surgical History:  Procedure Laterality Date   CORONARY ANGIOPLASTY  CORONARY STENT PLACEMENT  1999, 2006, 2016 x 2   Multiple, LAD in 1999, RCA in 2005.  has a total of 4 stents   ENDARTERECTOMY Left 09/21/2018   Procedure: ENDARTERECTOMY CAROTID;  Surgeon: Renford Dills, MD;  Location: [facility] ORS;  Service: Vascular;  Laterality: Left;   FEMORAL-POPLITEAL BYPASS GRAFT  2016   L fem to below the knee popliteal vein bypass graft   INCISE AND DRAIN ABCESS  2007   on scrotum   INCISION AND DRAINAGE PERIRECTAL ABSCESS  12/28/2011   Procedure: IRRIGATION AND DEBRIDEMENT PERIRECTAL ABSCESS;  Surgeon: Caleen Essex III, MD;  Location: [facility] OR;  Service:  General;  Laterality: N/A;    Social History Social History   Tobacco Use   Smoking status: Former    Packs/day: 1.00    Years: 40.00    Total pack years: 40.00    Types: Cigarettes    Quit date: 10/21/1998    Years since quitting: 23.3   Smokeless tobacco: Never   Tobacco comments:    Past smoker, states he will smoke maybe 1 cigarette/ month or so still  Vaping Use   Vaping Use: Never used  Substance Use Topics   Alcohol use: Not Currently    Alcohol/week: 0.0 standard drinks of alcohol    Comment: Occasional beer on the weekends   Drug use: No    Family History Family History  Problem Relation Age of Onset   Heart failure Father        CHF   CAD Mother    Diabetes Mother    Colon cancer Neg Hx    Prostate cancer Neg Hx     Allergies  Allergen Reactions   Augmentin [Amoxicillin-Pot Clavulanate] Other (See Comments)    Nausea,vomiting,diarrhea   Morphine And Related Nausea And Vomiting    Vomiting, GI upset   Metformin And Related Other (See Comments)    Intolerant of 2000mg  a day.      REVIEW OF SYSTEMS (Negative unless checked)  Constitutional: [] Weight loss  [] Fever  [] Chills Cardiac: [] Chest pain   [] Chest pressure   [] Palpitations   [] Shortness of breath when laying flat   [] Shortness of breath with exertion. Vascular:  [x] Pain in legs with walking   [] Pain in legs at rest  [] History of DVT   [] Phlebitis   [] Swelling in legs   [] Varicose veins   [] Non-healing ulcers Pulmonary:   [] Uses home oxygen   [] Productive cough   [] Hemoptysis   [] Wheeze  [] COPD   [] Asthma Neurologic:  [] Dizziness   [] Seizures   [] History of stroke   [] History of TIA  [] Aphasia   [] Vissual changes   [] Weakness or numbness in arm   [] Weakness or numbness in leg Musculoskeletal:   [] Joint swelling   [] Joint pain   [] Low back pain Hematologic:  [] Easy bruising  [] Easy bleeding   [] Hypercoagulable state   [] Anemic Gastrointestinal:  [] Diarrhea   [] Vomiting  [] Gastroesophageal  reflux/heartburn   [] Difficulty swallowing. Genitourinary:  [] Chronic kidney disease   [] Difficult urination  [] Frequent urination   [] Blood in urine Skin:  [] Rashes   [] Ulcers  Psychological:  [] History of anxiety   []  History of major depression.  Physical Examination  There were no vitals filed for this visit. There is no height or weight on file to calculate BMI. Gen: WD/WN, NAD Head: /AT, No temporalis wasting.  Ear/Nose/Throat: Hearing grossly intact, nares w/o erythema or drainage Eyes: PER, EOMI, sclera nonicteric.  Neck: Supple, no masses.  No  bruit or JVD.  Pulmonary:  Good air movement, no audible wheezing, no use of accessory muscles.  Cardiac: RRR, normal S1, S2, no Murmurs. Vascular:  mild trophic changes, no open wounds Vessel Right Left  Radial Palpable Palpable  PT Trace  Palpable Trace  Palpable  DP Not Palpable Not Palpable  Gastrointestinal: soft, non-distended. No guarding/no peritoneal signs.  Musculoskeletal: M/S 5/5 throughout.  No visible deformity.  Neurologic: CN 2-12 intact. Pain and light touch intact in extremities.  Symmetrical.  Speech is fluent. Motor exam as listed above. Psychiatric: Judgment intact, Mood & affect appropriate for pt's clinical situation. Dermatologic: No rashes or ulcers noted.  No changes consistent with cellulitis.   CBC Lab Results  Component Value Date   WBC 7.8 05/05/2021   HGB 14.3 05/06/2021   HCT 41.2 05/05/2021   MCV 91.9 05/05/2021   PLT 206 05/05/2021    BMET    Component Value Date/Time   NA 140 09/02/2021 0949   NA 134 (L) 10/29/2014 0908   K 4.4 09/02/2021 0949   K 4.3 10/29/2014 0908   CL 103 09/02/2021 0949   CL 103 10/29/2014 0908   CO2 31 09/02/2021 0949   CO2 24 10/29/2014 0908   GLUCOSE 150 (H) 09/02/2021 0949   GLUCOSE 142 (H) 10/29/2014 0908   BUN 14 09/02/2021 0949   BUN 20 10/29/2014 0908   CREATININE 1.19 09/02/2021 0949   CREATININE 1.32 (H) 10/29/2014 0908   CREATININE 0.96  01/28/2011 0855   CALCIUM 9.6 09/02/2021 0949   CALCIUM 9.3 10/29/2014 0908   GFRNONAA >60 05/05/2021 1351   GFRNONAA 55 (L) 10/29/2014 0908   GFRAA 56 (L) 11/22/2019 0952   GFRAA >60 10/29/2014 0908   CrCl cannot be calculated (Patient's most recent lab result is older than the maximum 21 days allowed.).  COAG Lab Results  Component Value Date   INR 1.0 05/05/2021   INR 1.0 09/14/2018   INR 0.9 10/24/2014    Radiology No results found.   Assessment/Plan 1. Bilateral carotid artery stenosis Recommend:   Given the patient's asymptomatic subcritical stenosis no further invasive testing or surgery at this time.   Duplex ultrasound shows RICA <40% and LICA 40-59% stenosis s/p left CEA.   Continue antiplatelet therapy as prescribed Continue management of CAD, HTN and Hyperlipidemia Healthy heart diet,  encouraged exercise at least 4 times per week Follow up in 12 months with duplex ultrasound and physical exam   - VAS US CAROTID; Future  2. Atherosclerosis of native artery of both lower extremities with intermittent claudication (HCC) Recommend:   The patient has evidence of atherosclerosis of the lower extremities with claudication.  The patient does not voice lifestyle limiting changes at this point in time.   Noninvasive studies do not suggest clinically significant change.   No invasive studies, angiography or surgery at this time The patient should continue walking and begin a more formal exercise program.  The patient should continue antiplatelet therapy and aggressive treatment of the lipid abnormalities   No changes in the patient's medications at this time - VAS [place] ABI WITH/WO TBI; Future  3. Abdominal aortic aneurysm (AAA) without rupture, unspecified part (HCC) No surgery or intervention at this time. The patient has an asymptomatic abdominal aortic aneurysm that is less than 4 cm in maximal diameter.  I have discussed the natural history of abdominal aortic  aneurysm and the small risk of rupture for aneurysm less than 5 cm in size.  However, as these  small aneurysms tend to enlarge over time, continued surveillance with ultrasound or CT scan is mandatory.  I have also discussed optimizing medical management with hypertension and lipid control and the importance of abstinence from tobacco.  The patient is also encouraged to exercise a minimum of 30 minutes 4 times a week.  Should the patient develop new onset abdominal or back pain or signs of peripheral embolization they are instructed to seek medical attention immediately and to alert the physician providing care that they have an aneurysm.  The patient voices their understanding. The patient will return in 12 months with an aortic duplex.   4. Popliteal artery aneurysm (HCC) No surgery or intervention at this time.   The patient has an asymptomatic popliteal artery aneurysm that is less than 2.5 cm in maximal diameter on the right and is s/p bypass on the left.  I have discussed the natural history of popliteal aneurysm and the small risk of thrombosis for aneurysm less than 2.5 cm in size.  However, as these small aneurysms tend to enlarge over time, continued surveillance with ultrasound is mandatory.   I have also discussed optimizing medical management with hypertension and lipid control and the importance of abstinence from tobacco.  The patient is also encouraged to exercise a minimum of 30 minutes 4 times a week.    Should the patient develop new leg pain or signs of peripheral embolization they are instructed to seek medical attention immediately and to alert the physician providing care that they have an aneurysm.  The patient voices their understanding.   5. Essential hypertension Continue antihypertensive medications as already ordered, these medications have been reviewed and there are no changes at this time.   6. Centrilobular emphysema (HCC) Continue pulmonary medications and  aerosols as already ordered, these medications have been reviewed and there are no changes at this time.    7. Type 2 diabetes mellitus with hyperlipidemia (HCC) Continue hypoglycemic medications as already ordered, these medications have been reviewed and there are no changes at this time.  Hgb A1C to be monitored as already arranged by primary service     Levora Dredge, MD  02/16/2022 3:05 PM

## 2022-02-17 ENCOUNTER — Ambulatory Visit (INDEPENDENT_AMBULATORY_CARE_PROVIDER_SITE_OTHER): Payer: Medicare Other

## 2022-02-17 ENCOUNTER — Ambulatory Visit (INDEPENDENT_AMBULATORY_CARE_PROVIDER_SITE_OTHER): Payer: Medicare Other | Admitting: Vascular Surgery

## 2022-02-17 ENCOUNTER — Encounter (INDEPENDENT_AMBULATORY_CARE_PROVIDER_SITE_OTHER): Payer: Self-pay | Admitting: Vascular Surgery

## 2022-02-17 VITALS — Wt 200.0 lb

## 2022-02-17 VITALS — BP 158/75 | HR 56 | Resp 16 | Wt 200.4 lb

## 2022-02-17 DIAGNOSIS — I724 Aneurysm of artery of lower extremity: Secondary | ICD-10-CM

## 2022-02-17 DIAGNOSIS — I714 Abdominal aortic aneurysm, without rupture, unspecified: Secondary | ICD-10-CM | POA: Diagnosis not present

## 2022-02-17 DIAGNOSIS — Z9889 Other specified postprocedural states: Secondary | ICD-10-CM | POA: Diagnosis not present

## 2022-02-17 DIAGNOSIS — I70213 Atherosclerosis of native arteries of extremities with intermittent claudication, bilateral legs: Secondary | ICD-10-CM

## 2022-02-17 DIAGNOSIS — I6523 Occlusion and stenosis of bilateral carotid arteries: Secondary | ICD-10-CM | POA: Diagnosis not present

## 2022-02-17 DIAGNOSIS — J432 Centrilobular emphysema: Secondary | ICD-10-CM

## 2022-02-17 DIAGNOSIS — Z0000 Encounter for general adult medical examination without abnormal findings: Secondary | ICD-10-CM

## 2022-02-17 DIAGNOSIS — I739 Peripheral vascular disease, unspecified: Secondary | ICD-10-CM

## 2022-02-17 DIAGNOSIS — I10 Essential (primary) hypertension: Secondary | ICD-10-CM

## 2022-02-17 DIAGNOSIS — E1169 Type 2 diabetes mellitus with other specified complication: Secondary | ICD-10-CM

## 2022-02-17 DIAGNOSIS — E785 Hyperlipidemia, unspecified: Secondary | ICD-10-CM

## 2022-02-17 NOTE — Progress Notes (Signed)
Virtual Visit via Telephone Note  I connected with  [name] on 02/17/22 at  1:00 PM EDT by telephone and verified that I am speaking with the correct person using two identifiers.  Location: Patient: home Provider: [place] Persons participating in the virtual visit: [place]   I discussed the limitations, risks, security and privacy concerns of performing an evaluation and management service by telephone and the availability of in person appointments. The patient expressed understanding and agreed to proceed.  Interactive audio and video telecommunications were attempted between this nurse and patient, however failed, due to patient having technical difficulties OR patient did not have access to video capability.  We continued and completed visit with audio only.  Some vital signs may be absent or patient reported.   Dionisio David, LPN  Subjective:   [name] is a 76 y.o. male who presents for Medicare Annual/Subsequent preventive examination.  Review of Systems           Objective:    There were no vitals filed for this visit. There is no height or weight on file to calculate BMI.     05/05/2021    1:51 PM 02/09/2021    8:20 AM 11/22/2019    9:51 AM 11/21/2019    4:19 PM 09/14/2018    9:42 AM 01/06/2018   10:17 PM 04/27/2017    8:08 AM  Advanced Directives  Does Patient Have a Medical Advance Directive? No No No No No No No  Does patient want to make changes to medical advance directive?  No - Patient declined       Would patient like information on creating a medical advance directive?     No - Patient declined No - Patient declined Yes (MAU/Ambulatory/Procedural Areas - Information given)    Current Medications (verified) Outpatient Encounter Medications as of 02/17/2022  Medication Sig   acetaminophen (TYLENOL) 325 MG tablet Take 1-2 tablets (325-650 mg total) by mouth every 4 (four) hours as needed for mild pain (or temp >/=  101 F).   albuterol (PROAIR HFA) 108 (90 BASE) MCG/ACT inhaler Inhale 2 puffs into the lungs 3 (three) times daily as needed (cough).   amLODipine (NORVASC) 5 MG tablet Take 0.5 tablets (2.5 mg total) by mouth daily.   aspirin EC 81 MG tablet Take 81 mg by mouth daily. Swallow whole.   atorvastatin (LIPITOR) 40 MG tablet TAKE 1 TABLET(40 MG) BY MOUTH DAILY   blood glucose meter kit and supplies KIT 1 each by Does not apply route daily as needed. Dispense based on patient and insurance preference. Use up to four times daily as directed. (FOR ICD-9 250.00, 250.01).   budesonide-formoterol (SYMBICORT) 160-4.5 MCG/ACT inhaler Inhale 2 puffs into the lungs 2 (two) times daily.   clopidogrel (PLAVIX) 75 MG tablet Take 1 tablet (75 mg total) by mouth daily.   EPINEPHrine 0.3 mg/0.3 mL IJ SOAJ injection SMARTSIG:0.3 Milliliter(s) IM Once PRN   famotidine (PEPCID) 20 MG tablet Take 1 tablet (20 mg total) by mouth 2 (two) times daily.   fenofibrate (TRICOR) 145 MG tablet TAKE 1 TABLET(145 MG) BY MOUTH DAILY   glucosamine-chondroitin 500-400 MG tablet Take 1 tablet by mouth 2 (two) times daily.   glucose blood (ONETOUCH ULTRA) test strip CHECK SUGAR TWICE DAILY   insulin glargine (LANTUS SOLOSTAR) 100 UNIT/ML Solostar Pen ADMINISTER 90-100 UNITS UNDER THE SKIN DAILY   Insulin Pen Needle (B-D UF III MINI PEN NEEDLES) 31G X 5  MM MISC Use as instructed to inject insulin.  Diagnosis:  E11.9  Insulin dependent.   Lancets (ONETOUCH ULTRASOFT) lancets Use as instructed to check sugar, dx E11.9.   lisinopril (ZESTRIL) 40 MG tablet Take 1 tablet (40 mg total) by mouth daily.   meclizine (ANTIVERT) 25 MG tablet Take 25 mg by mouth every 6 (six) hours as needed. For dizziness/vertigo   metFORMIN (GLUCOPHAGE) 500 MG tablet TAKE 1 TABLET(500 MG) BY MOUTH TWICE DAILY WITH A MEAL   metoprolol tartrate (LOPRESSOR) 25 MG tablet Take 0.5 tablets (12.5 mg total) by mouth 2 (two) times daily.   Multiple Vitamins-Minerals  (PRESERVISION AREDS 2 PO) Take 1 tablet by mouth 2 (two) times daily.   nitroGLYCERIN (NITROSTAT) 0.4 MG SL tablet Place 1 tablet (0.4 mg total) under the tongue every 5 (five) minutes as needed. For chest pain   No facility-administered encounter medications on file as of 02/17/2022.    Allergies (verified) Augmentin [amoxicillin-pot clavulanate], Morphine and related, and Metformin and related   History: Past Medical History:  Diagnosis Date   CAD (coronary artery disease)    a. 1999: PCI-->LAD 2/2 MI; b. 2005: inf MI s/p PCI/DES x 4 to RCA; c. Myoview in 12/2005: EF 61%, no evidence for ischemia; d. cath 10/2014: occluded mLAD w/ L to L and L to R collats, dRCA 95% s/p PCI/DES 0%, mPDA 70%, LCx mild to mod irregs, procedure complicated by inf ST ele in recovery, repeat cath showed acute dRCA stent thrombosis o/w occluded mRPDA, PTCA dRCA, PCI/DES RPDA, aggrastat x 18 hr   CKD (chronic kidney disease)    COPD (chronic obstructive pulmonary disease) (HCC)    DM2 (diabetes mellitus, type 2) ([place])    H/O hiatal hernia    Heart attack ([place]) [phone]   had 2 in one day(released plaque during angio). 5 stents total   HTN (hypertension)    Hypercholesterolemia    Impaired fasting glucose    Elevated after steroid injection   Macular degeneration of right eye 2020   receiving injections into eye   Osteoarthritis of hip    injections in both hips   Osteoarthritis, knee    PAD (peripheral artery disease) (HCC)    L fem to below the knee popliteal vein bypass graft   Peri-rectal abscess 12/27/2011   Tobacco abuse    Prior   Past Surgical History:  Procedure Laterality Date   [place], 2006, 2016 x 2   Multiple, LAD in 1999, RCA in 2005.  has a total of 4 stents   ENDARTERECTOMY Left 09/21/2018   Procedure: ENDARTERECTOMY CAROTID;  Surgeon: Katha Cabal, MD;  Location: [facility];  Service: Vascular;  Laterality: Left;    FEMORAL-POPLITEAL BYPASS GRAFT  2016   L fem to below the knee popliteal vein bypass graft   INCISE AND DRAIN ABCESS  2007   on scrotum   INCISION AND DRAINAGE PERIRECTAL ABSCESS  12/28/2011   Procedure: [place];  Surgeon: Merrie Roof, MD;  Location: [place];  Service: General;  Laterality: N/A;   Family History  Problem Relation Age of Onset   Heart failure Father        CHF   CAD Mother    Diabetes Mother    Colon cancer Neg Hx    Prostate cancer Neg Hx    Social History   Socioeconomic History   Marital status: Married  Spouse name: wanda   Number of children: 2   Years of education: Not on file   Highest education level: Not on file  Occupational History   Occupation: Press photographer for a [employer]    Comment: still works full time  Tobacco Use   Smoking status: Former    Packs/day: 1.00    Years: 40.00    Total pack years: 40.00    Types: Cigarettes    Quit date: 10/21/1998    Years since quitting: 23.3   Smokeless tobacco: Never   Tobacco comments:    Past smoker, states he will smoke maybe 1 cigarette/ month or so still  Vaping Use   Vaping Use: Never used  Substance and Sexual Activity   Alcohol use: Not Currently    Alcohol/week: 0.0 standard drinks of alcohol    Comment: Occasional beer on the weekends   Drug use: No   Sexual activity: Never  Other Topics Concern   Not on file  Social History Narrative   Lives in [place]   Married 50+ years   2 grown daughters, 7 grandchildren, 4 great grandchildren   Designated Party Release Form signed on 01/19/10 appointing Gerhard Munch.    Social Determinants of Health   Financial Resource Strain: Low Risk  (08/19/2021)   Overall Financial Resource Strain (CARDIA)    Difficulty of Paying Living Expenses: Not very hard  Food Insecurity: No Food Insecurity (02/09/2021)   Hunger Vital Sign    Worried About Running Out of Food in the Last Year: Never true    Ran Out of  Food in the Last Year: Never true  Transportation Needs: No Transportation Needs (02/09/2021)   PRAPARE - Hydrologist (Medical): No    Lack of Transportation (Non-Medical): No  Physical Activity: Insufficiently Active (02/17/2022)   Exercise Vital Sign    Days of Exercise per Week: 3 days    Minutes of Exercise per Session: 30 min  Stress: No Stress Concern Present (02/17/2022)   [place]    Feeling of Stress : Not at all  Social Connections: Not on file    Tobacco Counseling Counseling given: Not Answered Tobacco comments: Past smoker, states he will smoke maybe 1 cigarette/ month or so still   Clinical Intake:  Pre-visit preparation completed: Yes  Pain : No/denies pain     Nutritional Risks: None Diabetes: Yes CBG done?: No Did pt. bring in CBG monitor from home?: No  How often do you need to have someone help you when you read instructions, pamphlets, or other written materials from your doctor or pharmacy?: 1 - Never  Diabetic?yes Nutrition Risk Assessment:  Has the patient had any N/V/D within the last 2 months?  No  Does the patient have any non-healing wounds?  No  Has the patient had any unintentional weight loss or weight gain?  No   Diabetes:  Is the patient diabetic?  Yes  If diabetic, was a CBG obtained today?  No  Did the patient bring in their glucometer from home?  No  How often do you monitor your CBG's? Every morning.   Financial Strains and Diabetes Management:  Are you having any financial strains with the device, your supplies or your medication? No .  Does the patient want to be seen by Chronic Care Management for management of their diabetes?  No  Would the patient like to be referred to a Nutritionist  or for Diabetic Management?  No   Diabetic Exams:  Diabetic Eye Exam: Completed 08/11/20.  Pt has been advised about the importance in completing  this exam.  Diabetic Foot Exam: Completed 05/27/21. Pt has been advised about the importance in completing this exam.   Interpreter Needed?: No  Information entered by :: Kirke Shaggy, LPN   Activities of Daily Living     No data to display          Patient Care Team: Tonia Ghent, MD as PCP - General Rockey Situ, Kathlene November, MD as PCP - Cardiology (Cardiology) Debbora Dus, [facility] (Pharmacist) Minna Merritts, MD as Consulting Physician (Cardiology)  Indicate any recent Medical Services you may have received from other than [facility] providers in the past year (date may be approximate).     Assessment:   This is a routine wellness examination for [place].  Hearing/Vision screen No results found.  Dietary issues and exercise activities discussed:     Goals Addressed             This Visit's Progress    DIET - EAT MORE FRUITS AND VEGETABLES         Depression Screen    02/17/2022    1:03 PM 02/09/2021    8:21 AM 05/14/2020    9:16 AM 03/31/2017    2:50 PM 12/29/2016    5:29 PM 11/20/2015    3:18 PM  PHQ 2/9 Scores  PHQ - 2 Score 0 0 0 0 0 0  PHQ- 9 Score 0 0  0      Fall Risk    02/09/2021    8:21 AM 05/14/2020    9:15 AM 12/11/2019    4:09 PM 11/27/2019    4:25 PM 07/30/2018    8:50 AM  [place] in the past year? 0 0 0 0 0  Number falls in past yr: 0 0 0 0 0  Injury with Fall? 0  0 0 0  Risk for fall due to : Medication side effect      Follow up Falls evaluation completed;Falls prevention discussed Falls evaluation completed       FALL RISK PREVENTION PERTAINING TO THE HOME:  Any stairs in or around the home? No  If so, are there any without handrails? No  Home free of loose throw rugs in walkways, pet beds, electrical cords, etc? Yes  Adequate lighting in your home to reduce risk of falls? Yes   ASSISTIVE DEVICES UTILIZED TO PREVENT FALLS:  Life alert? No  Use of a cane, walker or w/c? No  Grab bars in the bathroom? Yes  Shower chair or  bench in shower? Yes  Elevated toilet seat or a handicapped toilet? No    Cognitive Function:declined      02/09/2021    8:23 AM 03/31/2017    3:07 PM 11/20/2015    3:15 PM  MMSE - Mini Mental State Exam  Not completed: Refused    Orientation to time  5 5  Orientation to Place  5 5  Registration  3 3  Attention/ Calculation  0 0  Recall  2 3  Recall-comments  pt was unable to recall 1 of 3 words   Language- name 2 objects  0 0  Language- repeat  1 1  Language- follow 3 step command  3 3  Language- read & follow direction  0 0  Write a sentence  0 0  Copy design  0  0  Total score  19 20        Immunizations Immunization History  Administered Date(s) Administered   Fluad Quad(high Dose 65+) 05/14/2020, 05/27/2021   Influenza,inj,Quad PF,6+ Mos 07/16/2013, 04/21/2014, 06/02/2015, 06/10/2016, 03/31/2017, 08/02/2018   PFIZER(Purple Top)SARS-COV-2 Vaccination 08/17/2019, 09/07/2019, 04/25/2020   Pneumococcal Conjugate-13 10/20/2014   Pneumococcal Polysaccharide-23 12/29/2011   Td 01/19/2010    TDAP status: Due, Education has been provided regarding the importance of this vaccine. Advised may receive this vaccine at local pharmacy or Health Dept. Aware to provide a copy of the vaccination record if obtained from local pharmacy or Health Dept. Verbalized acceptance and understanding.  Flu Vaccine status: Up to date  Pneumococcal vaccine status: Up to date  Covid-19 vaccine status: Completed vaccines  Qualifies for Shingles Vaccine? Yes   Zostavax completed No   Shingrix Completed?: No.    Education has been provided regarding the importance of this vaccine. Patient has been advised to call insurance company to determine out of pocket expense if they have not yet received this vaccine. Advised may also receive vaccine at local pharmacy or Health Dept. Verbalized acceptance and understanding.  Screening Tests Health Maintenance  Topic Date Due   Zoster Vaccines- Shingrix (1  of 2) Never done   COVID-19 Vaccine (4 - Pfizer series) 06/20/2020   OPHTHALMOLOGY EXAM  08/11/2021   INFLUENZA VACCINE  02/08/2022   TETANUS/TDAP  02/10/2024 (Originally 01/20/2020)   HEMOGLOBIN A1C  03/02/2022   FOOT EXAM  05/27/2022   Pneumonia Vaccine 59+ Years old  Completed   Hepatitis C Screening  Completed   HPV VACCINES  Aged Out   COLONOSCOPY (Pts 45-63yr Insurance coverage will need to be confirmed)  Discontinued    Health Maintenance  Health Maintenance Due  Topic Date Due   Zoster Vaccines- Shingrix (1 of 2) Never done   COVID-19 Vaccine (4 - Pfizer series) 06/20/2020   OPHTHALMOLOGY EXAM  08/11/2021   INFLUENZA VACCINE  02/08/2022    Colorectal cancer screening: No longer required.   Lung Cancer Screening: (Low Dose CT Chest recommended if Age 77-80years, 30 pack-year currently smoking OR have quit w/in 15years.) does not qualify.    Additional Screening:  Hepatitis C Screening: does qualify; Completed 11/20/15  Vision Screening: Recommended annual ophthalmology exams for early detection of glaucoma and other disorders of the eye. Is the patient up to date with their annual eye exam?  Yes  Who is the provider or what is the name of the office in which the patient attends annual eye exams? Dr.Bell If pt is not established with a provider, would they like to be referred to a provider to establish care? No .   Dental Screening: Recommended annual dental exams for proper oral hygiene  Community Resource Referral / Chronic Care Management: CRR required this visit?  No   CCM required this visit?  No      Plan:     I have personally reviewed and noted the following in the patient's chart:   Medical and social history Use of alcohol, tobacco or illicit drugs  Current medications and supplements including opioid prescriptions. Patient is not currently taking opioid prescriptions. Functional ability and status Nutritional status Physical activity Advanced  directives List of other physicians Hospitalizations, surgeries, and ER visits in previous 12 months Vitals Screenings to include cognitive, depression, and falls Referrals and appointments  In addition, I have reviewed and discussed with patient certain preventive protocols, quality metrics, and best practice recommendations. A written  personalized care plan for preventive services as well as general preventive health recommendations were provided to patient.     Dionisio David, LPN   [id number]   Nurse Notes: none

## 2022-02-17 NOTE — Patient Instructions (Signed)
[name] , Thank you for taking time to come for your Medicare Wellness Visit. I appreciate your ongoing commitment to your health goals. Please review the following plan we discussed and let me know if I can assist you in the future.   Screening recommendations/referrals: Colonoscopy: aged out Recommended yearly ophthalmology/optometry visit for glaucoma screening and checkup Recommended yearly dental visit for hygiene and checkup  Vaccinations: Influenza vaccine: 05/27/21 Pneumococcal vaccine: 10/20/14 Tdap vaccine: 01/19/10, due if have injury Shingles vaccine: n/d   Covid-19: 08/17/19, 09/07/19, 04/25/20  Advanced directives: no  Conditions/risks identified: none  Next appointment: Follow up in one year for your annual wellness visit. 02/20/23 @ 2:15 pm by phone  Preventive Care 65 Years and Older, Male Preventive care refers to lifestyle choices and visits with your health care provider that can promote health and wellness. What does preventive care include? A yearly physical exam. This is also called an annual well check. Dental exams once or twice a year. Routine eye exams. Ask your health care provider how often you should have your eyes checked. Personal lifestyle choices, including: Daily care of your teeth and gums. Regular physical activity. Eating a healthy diet. Avoiding tobacco and drug use. Limiting alcohol use. Practicing safe sex. Taking low doses of aspirin every day. Taking vitamin and mineral supplements as recommended by your health care provider. What happens during an annual well check? The services and screenings done by your health care provider during your annual well check will depend on your age, overall health, lifestyle risk factors, and family history of disease. Counseling  Your health care provider may ask you questions about your: Alcohol use. Tobacco use. Drug use. Emotional well-being. Home and relationship well-being. Sexual  activity. Eating habits. History of falls. Memory and ability to understand (cognition). Work and work Astronomer. Screening  You may have the following tests or measurements: Height, weight, and BMI. Blood pressure. Lipid and cholesterol levels. These may be checked every 5 years, or more frequently if you are over 52 years old. Skin check. Lung cancer screening. You may have this screening every year starting at age 25 if you have a 30-pack-year history of smoking and currently smoke or have quit within the past 15 years. Fecal occult blood test (FOBT) of the stool. You may have this test every year starting at age 25. Flexible sigmoidoscopy or colonoscopy. You may have a sigmoidoscopy every 5 years or a colonoscopy every 10 years starting at age 35. Prostate cancer screening. Recommendations will vary depending on your family history and other risks. Hepatitis C blood test. Hepatitis B blood test. Sexually transmitted disease (STD) testing. Diabetes screening. This is done by checking your blood sugar (glucose) after you have not eaten for a while (fasting). You may have this done every 1-3 years. Abdominal aortic aneurysm (AAA) screening. You may need this if you are a current or former smoker. Osteoporosis. You may be screened starting at age 83 if you are at high risk. Talk with your health care provider about your test results, treatment options, and if necessary, the need for more tests. Vaccines  Your health care provider may recommend certain vaccines, such as: Influenza vaccine. This is recommended every year. Tetanus, diphtheria, and acellular pertussis (Tdap, Td) vaccine. You may need a Td booster every 10 years. Zoster vaccine. You may need this after age 50. Pneumococcal 13-valent conjugate (PCV13) vaccine. One dose is recommended after age 18. Pneumococcal polysaccharide (PPSV23) vaccine. One dose is recommended after age 8.  Talk to your health care provider about which  screenings and vaccines you need and how often you need them. This information is not intended to replace advice given to you by your health care provider. Make sure you discuss any questions you have with your health care provider. Document Released: 07/24/2015 Document Revised: 03/16/2016 Document Reviewed: 04/28/2015 Elsevier Interactive Patient Education  2017 [place] Prevention in the Home Falls can cause injuries. They can happen to people of all ages. There are many things you can do to make your home safe and to help prevent falls. What can I do on the outside of my home? Regularly fix the edges of walkways and driveways and fix any cracks. Remove anything that might make you trip as you walk through a door, such as a raised step or threshold. Trim any bushes or trees on the path to your home. Use bright outdoor lighting. Clear any walking paths of anything that might make someone trip, such as rocks or tools. Regularly check to see if handrails are loose or broken. Make sure that both sides of any steps have handrails. Any raised decks and porches should have guardrails on the edges. Have any leaves, snow, or ice cleared regularly. Use sand or salt on walking paths during winter. Clean up any spills in your garage right away. This includes oil or grease spills. What can I do in the bathroom? Use night lights. Install grab bars by the toilet and in the tub and shower. Do not use towel bars as grab bars. Use non-skid mats or decals in the tub or shower. If you need to sit down in the shower, use a plastic, non-slip stool. Keep the floor dry. Clean up any water that spills on the floor as soon as it happens. Remove soap buildup in the tub or shower regularly. Attach bath mats securely with double-sided non-slip rug tape. Do not have throw rugs and other things on the floor that can make you trip. What can I do in the bedroom? Use night lights. Make sure that you have a  light by your bed that is easy to reach. Do not use any sheets or blankets that are too big for your bed. They should not hang down onto the floor. Have a firm chair that has side arms. You can use this for support while you get dressed. Do not have throw rugs and other things on the floor that can make you trip. What can I do in the kitchen? Clean up any spills right away. Avoid walking on wet floors. Keep items that you use a lot in easy-to-reach places. If you need to reach something above you, use a strong step stool that has a grab bar. Keep electrical cords out of the way. Do not use floor polish or wax that makes floors slippery. If you must use wax, use non-skid floor wax. Do not have throw rugs and other things on the floor that can make you trip. What can I do with my stairs? Do not leave any items on the stairs. Make sure that there are handrails on both sides of the stairs and use them. Fix handrails that are broken or loose. Make sure that handrails are as long as the stairways. Check any carpeting to make sure that it is firmly attached to the stairs. Fix any carpet that is loose or worn. Avoid having throw rugs at the top or bottom of the stairs. If you do have throw  rugs, attach them to the floor with carpet tape. Make sure that you have a light switch at the top of the stairs and the bottom of the stairs. If you do not have them, ask someone to add them for you. What else can I do to help prevent falls? Wear shoes that: Do not have high heels. Have rubber bottoms. Are comfortable and fit you well. Are closed at the toe. Do not wear sandals. If you use a stepladder: Make sure that it is fully opened. Do not climb a closed stepladder. Make sure that both sides of the stepladder are locked into place. Ask someone to hold it for you, if possible. Clearly mark and make sure that you can see: Any grab bars or handrails. First and last steps. Where the edge of each step  is. Use tools that help you move around (mobility aids) if they are needed. These include: Canes. Walkers. Scooters. Crutches. Turn on the lights when you go into a dark area. Replace any light bulbs as soon as they burn out. Set up your furniture so you have a clear path. Avoid moving your furniture around. If any of your floors are uneven, fix them. If there are any pets around you, be aware of where they are. Review your medicines with your doctor. Some medicines can make you feel dizzy. This can increase your chance of falling. Ask your doctor what other things that you can do to help prevent falls. This information is not intended to replace advice given to you by your health care provider. Make sure you discuss any questions you have with your health care provider. Document Released: 04/23/2009 Document Revised: 12/03/2015 Document Reviewed: 08/01/2014 Elsevier Interactive Patient Education  2017 [employer].

## 2022-02-18 ENCOUNTER — Encounter (INDEPENDENT_AMBULATORY_CARE_PROVIDER_SITE_OTHER): Payer: Self-pay | Admitting: Vascular Surgery

## 2022-02-24 ENCOUNTER — Telehealth: Payer: Self-pay

## 2022-02-24 NOTE — Progress Notes (Signed)
Chronic Care Management Pharmacy Assistant   Name: [name]  MRN: [id number] DOB: 05-02-1946  Reason for Encounter: CCM (Appointment Reminder)  Medications: Outpatient Encounter Medications as of 02/24/2022  Medication Sig   acetaminophen (TYLENOL) 325 MG tablet Take 1-2 tablets (325-650 mg total) by mouth every 4 (four) hours as needed for mild pain (or temp >/= 101 F).   albuterol (PROAIR HFA) 108 (90 BASE) MCG/ACT inhaler Inhale 2 puffs into the lungs 3 (three) times daily as needed (cough).   amLODipine (NORVASC) 5 MG tablet Take 0.5 tablets (2.5 mg total) by mouth daily.   aspirin EC 81 MG tablet Take 81 mg by mouth daily. Swallow whole.   atorvastatin (LIPITOR) 40 MG tablet TAKE 1 TABLET(40 MG) BY MOUTH DAILY   blood glucose meter kit and supplies KIT 1 each by Does not apply route daily as needed. Dispense based on patient and insurance preference. Use up to four times daily as directed. (FOR ICD-9 250.00, 250.01).   budesonide-formoterol (SYMBICORT) 160-4.5 MCG/ACT inhaler Inhale 2 puffs into the lungs 2 (two) times daily.   clopidogrel (PLAVIX) 75 MG tablet Take 1 tablet (75 mg total) by mouth daily.   EPINEPHrine 0.3 mg/0.3 mL IJ SOAJ injection SMARTSIG:0.3 Milliliter(s) IM Once PRN   famotidine (PEPCID) 20 MG tablet Take 1 tablet (20 mg total) by mouth 2 (two) times daily.   fenofibrate (TRICOR) 145 MG tablet TAKE 1 TABLET(145 MG) BY MOUTH DAILY   glucosamine-chondroitin 500-400 MG tablet Take 1 tablet by mouth 2 (two) times daily.   glucose blood (ONETOUCH ULTRA) test strip CHECK SUGAR TWICE DAILY   insulin glargine (LANTUS SOLOSTAR) 100 UNIT/ML Solostar Pen ADMINISTER 90-100 UNITS UNDER THE SKIN DAILY   Insulin Pen Needle (B-D UF III MINI PEN NEEDLES) 31G X 5 MM MISC Use as instructed to inject insulin.  Diagnosis:  E11.9  Insulin dependent.   Lancets (ONETOUCH ULTRASOFT) lancets Use as instructed to check sugar, dx E11.9.   lisinopril (ZESTRIL) 40 MG tablet Take 1  tablet (40 mg total) by mouth daily.   meclizine (ANTIVERT) 25 MG tablet Take 25 mg by mouth every 6 (six) hours as needed. For dizziness/vertigo   metFORMIN (GLUCOPHAGE) 500 MG tablet TAKE 1 TABLET(500 MG) BY MOUTH TWICE DAILY WITH A MEAL   metoprolol tartrate (LOPRESSOR) 25 MG tablet Take 0.5 tablets (12.5 mg total) by mouth 2 (two) times daily.   Multiple Vitamins-Minerals (PRESERVISION AREDS 2 PO) Take 1 tablet by mouth 2 (two) times daily.   nitroGLYCERIN (NITROSTAT) 0.4 MG SL tablet Place 1 tablet (0.4 mg total) under the tongue every 5 (five) minutes as needed. For chest pain   No facility-administered encounter medications on file as of 02/24/2022.   [name] was contacted to remind of upcoming telephone visit with Charlene Brooke on 03/01/22 at 8:45. Patient was reminded to have any blood glucose and blood pressure readings available for review at appointment.   Message was left reminding patient of appointment.  CCM referral has been placed prior to visit?  No   Star Rating Drugs: Medication:  Last Fill: Day Supply Metformin 500 mg       01/05/2022      90 Atorvastatin 40 mg      11/24/2021      90 Lisinopril 40 mg           01/24/2022      [place], CPP notified  Marijean Niemann, [facility] Assistant [phone]

## 2022-02-24 NOTE — Progress Notes (Signed)
Virtual Visit via Telephone Note  I connected with  Lutricia Horsfall on 02/24/22 at  1:00 PM EDT by telephone and verified that I am speaking with the correct person using two identifiers.  Location: Patient: home Provider: [place] Persons participating in the virtual visit: [place]   I discussed the limitations, risks, security and privacy concerns of performing an evaluation and management service by telephone and the availability of in person appointments. The patient expressed understanding and agreed to proceed.  Interactive audio and video telecommunications were attempted between this nurse and patient, however failed, due to patient having technical difficulties OR patient did not have access to video capability.  We continued and completed visit with audio only.  Some vital signs may be absent or patient reported.   Dionisio David, LPN  Subjective:   RANDIE TALLARICO is a 76 y.o. male who presents for Medicare Annual/Subsequent preventive examination.  Review of Systems     Cardiac Risk Factors include: advanced age (>89mn, >>85women);diabetes mellitus;hypertension     Objective:    Today's Vitals   02/17/22 1313  Weight: 200 lb (90.7 kg)   Body mass index is 30.41 kg/m.     02/17/2022    1:05 PM 05/05/2021    1:51 PM 02/09/2021    8:20 AM 11/22/2019    9:51 AM 11/21/2019    4:19 PM 09/14/2018    9:42 AM 01/06/2018   10:17 PM  Advanced Directives  Does Patient Have a Medical Advance Directive? No No No No No No No  Does patient want to make changes to medical advance directive?   No - Patient declined      Would patient like information on creating a medical advance directive? No - Patient declined     No - Patient declined No - Patient declined    Current Medications (verified) Outpatient Encounter Medications as of 02/17/2022  Medication Sig   acetaminophen (TYLENOL) 325 MG tablet Take 1-2 tablets (325-650 mg total) by mouth every 4  (four) hours as needed for mild pain (or temp >/= 101 F).   albuterol (PROAIR HFA) 108 (90 BASE) MCG/ACT inhaler Inhale 2 puffs into the lungs 3 (three) times daily as needed (cough).   amLODipine (NORVASC) 5 MG tablet Take 0.5 tablets (2.5 mg total) by mouth daily.   aspirin EC 81 MG tablet Take 81 mg by mouth daily. Swallow whole.   atorvastatin (LIPITOR) 40 MG tablet TAKE 1 TABLET(40 MG) BY MOUTH DAILY   blood glucose meter kit and supplies KIT 1 each by Does not apply route daily as needed. Dispense based on patient and insurance preference. Use up to four times daily as directed. (FOR ICD-9 250.00, 250.01).   budesonide-formoterol (SYMBICORT) 160-4.5 MCG/ACT inhaler Inhale 2 puffs into the lungs 2 (two) times daily.   clopidogrel (PLAVIX) 75 MG tablet Take 1 tablet (75 mg total) by mouth daily.   EPINEPHrine 0.3 mg/0.3 mL IJ SOAJ injection SMARTSIG:0.3 Milliliter(s) IM Once PRN   famotidine (PEPCID) 20 MG tablet Take 1 tablet (20 mg total) by mouth 2 (two) times daily.   fenofibrate (TRICOR) 145 MG tablet TAKE 1 TABLET(145 MG) BY MOUTH DAILY   glucosamine-chondroitin 500-400 MG tablet Take 1 tablet by mouth 2 (two) times daily.   glucose blood (ONETOUCH ULTRA) test strip CHECK SUGAR TWICE DAILY   insulin glargine (LANTUS SOLOSTAR) 100 UNIT/ML Solostar Pen ADMINISTER 90-100 UNITS UNDER THE SKIN DAILY   Insulin Pen Needle (B-D UF  III MINI PEN NEEDLES) 31G X 5 MM MISC Use as instructed to inject insulin.  Diagnosis:  E11.9  Insulin dependent.   Lancets (ONETOUCH ULTRASOFT) lancets Use as instructed to check sugar, dx E11.9.   lisinopril (ZESTRIL) 40 MG tablet Take 1 tablet (40 mg total) by mouth daily.   meclizine (ANTIVERT) 25 MG tablet Take 25 mg by mouth every 6 (six) hours as needed. For dizziness/vertigo   metFORMIN (GLUCOPHAGE) 500 MG tablet TAKE 1 TABLET(500 MG) BY MOUTH TWICE DAILY WITH A MEAL   metoprolol tartrate (LOPRESSOR) 25 MG tablet Take 0.5 tablets (12.5 mg total) by mouth 2  (two) times daily.   Multiple Vitamins-Minerals (PRESERVISION AREDS 2 PO) Take 1 tablet by mouth 2 (two) times daily.   nitroGLYCERIN (NITROSTAT) 0.4 MG SL tablet Place 1 tablet (0.4 mg total) under the tongue every 5 (five) minutes as needed. For chest pain   No facility-administered encounter medications on file as of 02/17/2022.    Allergies (verified) Augmentin [amoxicillin-pot clavulanate], Morphine and related, and Metformin and related   History: Past Medical History:  Diagnosis Date   CAD (coronary artery disease)    a. 1999: PCI-->LAD 2/2 MI; b. 2005: inf MI s/p PCI/DES x 4 to RCA; c. Myoview in 12/2005: EF 61%, no evidence for ischemia; d. cath 10/2014: occluded mLAD w/ L to L and L to R collats, dRCA 95% s/p PCI/DES 0%, mPDA 70%, LCx mild to mod irregs, procedure complicated by inf ST ele in recovery, repeat cath showed acute dRCA stent thrombosis o/w occluded mRPDA, PTCA dRCA, PCI/DES RPDA, aggrastat x 18 hr   CKD (chronic kidney disease)    COPD (chronic obstructive pulmonary disease) (HCC)    DM2 (diabetes mellitus, type 2) ([place])    H/O hiatal hernia    Heart attack ([place]) [phone]   had 2 in one day(released plaque during angio). 5 stents total   HTN (hypertension)    Hypercholesterolemia    Impaired fasting glucose    Elevated after steroid injection   Macular degeneration of right eye 2020   receiving injections into eye   Osteoarthritis of hip    injections in both hips   Osteoarthritis, knee    PAD (peripheral artery disease) (HCC)    L fem to below the knee popliteal vein bypass graft   Peri-rectal abscess 12/27/2011   Tobacco abuse    Prior   Past Surgical History:  Procedure Laterality Date   [place], 2006, 2016 x 2   Multiple, LAD in 1999, RCA in 2005.  has a total of 4 stents   ENDARTERECTOMY Left 09/21/2018   Procedure: ENDARTERECTOMY CAROTID;  Surgeon: Katha Cabal, MD;  Location: [facility];   Service: Vascular;  Laterality: Left;   FEMORAL-POPLITEAL BYPASS GRAFT  2016   L fem to below the knee popliteal vein bypass graft   INCISE AND DRAIN ABCESS  2007   on scrotum   INCISION AND DRAINAGE PERIRECTAL ABSCESS  12/28/2011   Procedure: [place];  Surgeon: Merrie Roof, MD;  Location: [facility];  Service: General;  Laterality: N/A;   Family History  Problem Relation Age of Onset   Heart failure Father        CHF   CAD Mother    Diabetes Mother    Colon cancer Neg Hx    Prostate cancer Neg Hx    Social History   Socioeconomic History  Marital status: Married    Spouse name: wanda   Number of children: 2   Years of education: Not on file   Highest education level: Not on file  Occupational History   Occupation: Press photographer for a [employer]    Comment: still works full time  Tobacco Use   Smoking status: Former    Packs/day: 1.00    Years: 40.00    Total pack years: 40.00    Types: Cigarettes    Quit date: 10/21/1998    Years since quitting: 23.3   Smokeless tobacco: Never   Tobacco comments:    Past smoker, states he will smoke maybe 1 cigarette/ month or so still  Vaping Use   Vaping Use: Never used  Substance and Sexual Activity   Alcohol use: Not Currently    Alcohol/week: 0.0 standard drinks of alcohol    Comment: Occasional beer on the weekends   Drug use: No   Sexual activity: Never  Other Topics Concern   Not on file  Social History Narrative   Lives in [place]   Married 50+ years   2 grown daughters, 7 grandchildren, 4 great grandchildren   Designated Party Release Form signed on 01/19/10 appointing Gerhard Munch.    Social Determinants of Health   Financial Resource Strain: Low Risk  (02/17/2022)   Overall Financial Resource Strain (CARDIA)    Difficulty of Paying Living Expenses: Not hard at all  Food Insecurity: No Food Insecurity (02/17/2022)   Hunger Vital Sign    Worried About Running Out of Food  in the Last Year: Never true    Ran Out of Food in the Last Year: Never true  Transportation Needs: No Transportation Needs (02/17/2022)   PRAPARE - Hydrologist (Medical): No    Lack of Transportation (Non-Medical): No  Physical Activity: Insufficiently Active (02/17/2022)   Exercise Vital Sign    Days of Exercise per Week: 3 days    Minutes of Exercise per Session: 30 min  Stress: No Stress Concern Present (02/17/2022)   [place]    Feeling of Stress : Not at all  Social Connections: Moderately Integrated (02/17/2022)   Social Connection and Isolation Panel [NHANES]    Frequency of Communication with Friends and Family: More than three times a week    Frequency of Social Gatherings with Friends and Family: Once a week    Attends Religious Services: More than 4 times per year    Active Member of [employer] or Organizations: No    Attends Music therapist: Never    Marital Status: Married    Tobacco Counseling Counseling given: Not Answered Tobacco comments: Past smoker, states he will smoke maybe 1 cigarette/ month or so still   Clinical Intake:  Pre-visit preparation completed: Yes  Pain : No/denies pain     Nutritional Risks: None Diabetes: Yes CBG done?: No Did pt. bring in CBG monitor from home?: No  How often do you need to have someone help you when you read instructions, pamphlets, or other written materials from your doctor or pharmacy?: 1 - Never  Diabetic?yes Nutrition Risk Assessment:  Has the patient had any N/V/D within the last 2 months?  No  Does the patient have any non-healing wounds?  No  Has the patient had any unintentional weight loss or weight gain?  No   Diabetes:  Is the patient diabetic?  Yes  If diabetic, was  a CBG obtained today?  No  Did the patient bring in their glucometer from home?  No  How often do you monitor your CBG's? Every  morning.   Financial Strains and Diabetes Management:  Are you having any financial strains with the device, your supplies or your medication? No .  Does the patient want to be seen by Chronic Care Management for management of their diabetes?  No  Would the patient like to be referred to a Nutritionist or for Diabetic Management?  No   Diabetic Exams:  Diabetic Eye Exam: Completed 08/11/20.  Pt has been advised about the importance in completing this exam.  Diabetic Foot Exam: Completed 05/27/21. Pt has been advised about the importance in completing this exam.   Interpreter Needed?: No  Information entered by :: Kirke Shaggy, LPN   Activities of Daily Living    02/17/2022    1:06 PM  In your present state of health, do you have any difficulty performing the following activities:  Hearing? 1  Vision? 1  Difficulty concentrating or making decisions? 0  Walking or climbing stairs? 0  Dressing or bathing? 0  Doing errands, shopping? 0  Preparing Food and eating ? N  Using the Toilet? N  In the past six months, have you accidently leaked urine? N  Do you have problems with loss of bowel control? N  Managing your Medications? N  Managing your Finances? N  Housekeeping or managing your Housekeeping? N    Patient Care Team: Tonia Ghent, MD as PCP - General Rockey Situ Kathlene November, MD as PCP - Cardiology (Cardiology) Debbora Dus, [facility] (Pharmacist) Minna Merritts, MD as Consulting Physician (Cardiology)  Indicate any recent Medical Services you may have received from other than [facility] providers in the past year (date may be approximate).     Assessment:   This is a routine wellness examination for Constantino.  Hearing/Vision screen Hearing Screening - Comments:: Wears aids Vision Screening - Comments:: Eyeglasses- Dr.Bell  Dietary issues and exercise activities discussed: Current Exercise Habits: Home exercise routine, Type of exercise: walking, Time (Minutes): 30, Frequency  (Times/Week): 3, Weekly Exercise (Minutes/Week): 90, Intensity: Mild   Goals Addressed             This Visit's Progress    DIET - EAT MORE FRUITS AND VEGETABLES        Depression Screen    02/17/2022    1:03 PM 02/09/2021    8:21 AM 05/14/2020    9:16 AM 03/31/2017    2:50 PM 12/29/2016    5:29 PM 11/20/2015    3:18 PM  PHQ 2/9 Scores  PHQ - 2 Score 0 0 0 0 0 0  PHQ- 9 Score 0 0  0      Fall Risk    02/17/2022    1:06 PM 02/09/2021    8:21 AM 05/14/2020    9:15 AM 12/11/2019    4:09 PM 11/27/2019    4:25 PM  Fall Risk   Falls in the past year? 0 0 0 0 0  Number falls in past yr: 0 0 0 0 0  Injury with Fall? 0 0  0 0  Risk for fall due to : No Fall Risks Medication side effect     Follow up Falls evaluation completed Falls evaluation completed;Falls prevention discussed Falls evaluation completed      FALL RISK PREVENTION PERTAINING TO THE HOME:  Any stairs in or around the home? No  If so,  are there any without handrails? No  Home free of loose throw rugs in walkways, pet beds, electrical cords, etc? Yes  Adequate lighting in your home to reduce risk of falls? Yes   ASSISTIVE DEVICES UTILIZED TO PREVENT FALLS:  Life alert? No  Use of a cane, walker or w/c? No  Grab bars in the bathroom? Yes  Shower chair or bench in shower? Yes  Elevated toilet seat or a handicapped toilet? No    Cognitive Function:declined PT A & O      02/09/2021    8:23 AM 03/31/2017    3:07 PM 11/20/2015    3:15 PM  MMSE - Mini Mental State Exam  Not completed: Refused    Orientation to time  5 5  Orientation to Place  5 5  Registration  3 3  Attention/ Calculation  0 0  Recall  2 3  Recall-comments  pt was unable to recall 1 of 3 words   Language- name 2 objects  0 0  Language- repeat  1 1  Language- follow 3 step command  3 3  Language- read & follow direction  0 0  Write a sentence  0 0  Copy design  0 0  Total score  19 20        Immunizations Immunization History   Administered Date(s) Administered   Fluad Quad(high Dose 65+) 05/14/2020, 05/27/2021   Influenza,inj,Quad PF,6+ Mos 07/16/2013, 04/21/2014, 06/02/2015, 06/10/2016, 03/31/2017, 08/02/2018   PFIZER(Purple Top)SARS-COV-2 Vaccination 08/17/2019, 09/07/2019, 04/25/2020   Pneumococcal Conjugate-13 10/20/2014   Pneumococcal Polysaccharide-23 12/29/2011   Td 01/19/2010    TDAP status: Due, Education has been provided regarding the importance of this vaccine. Advised may receive this vaccine at local pharmacy or Health Dept. Aware to provide a copy of the vaccination record if obtained from local pharmacy or Health Dept. Verbalized acceptance and understanding.  Flu Vaccine status: Up to date  Pneumococcal vaccine status: Up to date  Covid-19 vaccine status: Completed vaccines  Qualifies for Shingles Vaccine? Yes   Zostavax completed No   Shingrix Completed?: No.    Education has been provided regarding the importance of this vaccine. Patient has been advised to call insurance company to determine out of pocket expense if they have not yet received this vaccine. Advised may also receive vaccine at local pharmacy or Health Dept. Verbalized acceptance and understanding.  Screening Tests Health Maintenance  Topic Date Due   Zoster Vaccines- Shingrix (1 of 2) Never done   COVID-19 Vaccine (4 - Pfizer series) 06/20/2020   OPHTHALMOLOGY EXAM  08/11/2021   INFLUENZA VACCINE  02/08/2022   TETANUS/TDAP  02/10/2024 (Originally 01/20/2020)   HEMOGLOBIN A1C  03/02/2022   FOOT EXAM  05/27/2022   Pneumonia Vaccine 13+ Years old  Completed   Hepatitis C Screening  Completed   HPV VACCINES  Aged Out   COLONOSCOPY (Pts 45-101yr Insurance coverage will need to be confirmed)  Discontinued    Health Maintenance  Health Maintenance Due  Topic Date Due   Zoster Vaccines- Shingrix (1 of 2) Never done   COVID-19 Vaccine (4 - Pfizer series) 06/20/2020   OPHTHALMOLOGY EXAM  08/11/2021   INFLUENZA VACCINE   02/08/2022    Colorectal cancer screening: No longer required.   Lung Cancer Screening: (Low Dose CT Chest recommended if Age 77-80years, 30 pack-year currently smoking OR have quit w/in 15years.) does not qualify.    Additional Screening:  Hepatitis C Screening: does qualify; Completed 11/20/15  Vision Screening: Recommended  annual ophthalmology exams for early detection of glaucoma and other disorders of the eye. Is the patient up to date with their annual eye exam?  Yes  Who is the provider or what is the name of the office in which the patient attends annual eye exams? Dr.Bell If pt is not established with a provider, would they like to be referred to a provider to establish care? No .   Dental Screening: Recommended annual dental exams for proper oral hygiene  Community Resource Referral / Chronic Care Management: CRR required this visit?  No   CCM required this visit?  No      Plan:     I have personally reviewed and noted the following in the patient's chart:   Medical and social history Use of alcohol, tobacco or illicit drugs  Current medications and supplements including opioid prescriptions. Patient is not currently taking opioid prescriptions. Functional ability and status Nutritional status Physical activity Advanced directives List of other physicians Hospitalizations, surgeries, and ER visits in previous 12 months Vitals Screenings to include cognitive, depression, and falls Referrals and appointments  In addition, I have reviewed and discussed with patient certain preventive protocols, quality metrics, and best practice recommendations. A written personalized care plan for preventive services as well as general preventive health recommendations were provided to patient.     Dionisio David, LPN   [id number]   Nurse Notes: none

## 2022-03-01 ENCOUNTER — Telehealth: Payer: Self-pay

## 2022-03-01 ENCOUNTER — Ambulatory Visit: Payer: Medicare Other | Admitting: Pharmacist

## 2022-03-01 DIAGNOSIS — I739 Peripheral vascular disease, unspecified: Secondary | ICD-10-CM

## 2022-03-01 DIAGNOSIS — E1169 Type 2 diabetes mellitus with other specified complication: Secondary | ICD-10-CM

## 2022-03-01 DIAGNOSIS — E782 Mixed hyperlipidemia: Secondary | ICD-10-CM

## 2022-03-01 DIAGNOSIS — I10 Essential (primary) hypertension: Secondary | ICD-10-CM

## 2022-03-01 DIAGNOSIS — J432 Centrilobular emphysema: Secondary | ICD-10-CM

## 2022-03-01 NOTE — Progress Notes (Signed)
Chronic Care Management Pharmacy Note  03/04/2022 Name:  [name] MRN:  [id number] DOB:  05-Mar-1946  Summary: CCM F/U visit -Reviewed medications; pt affirms compliance and denies issues; -HTN: BP elevated lately 140s-150s -DM: fructosamine correlates with A1c 6.1%; pt gets Lantus through PAP -Pt having hip issues, advised to make appt with Dr Lorelei Pont  Recommendations/Changes made from today's visit: -Advised pt to monitor BP and keep track -Advised pt to make appt with Dr Lorelei Pont for hip pain -Refill Lantus from [place]: -[place] will call patient 1 month for BP update -Pharmacist follow up televisit scheduled for 3 months    Subjective: [name] is an 76 y.o. year old male who is a primary patient of Damita Dunnings, Elveria Rising, MD.  The CCM team was consulted for assistance with disease management and care coordination needs.    Engaged with patient by telephone for follow up visit in response to provider referral for pharmacy case management and/or care coordination services.   Consent to Services:  The patient was given information about Chronic Care Management services, agreed to services, and gave verbal consent prior to initiation of services.  Please see initial visit note for detailed documentation.   Patient Care Team: Tonia Ghent, MD as PCP - General Rockey Situ Kathlene November, MD as PCP - Cardiology (Cardiology) Minna Merritts, MD as Consulting Physician (Cardiology) Charlton Haws, [facility] as Pharmacist (Pharmacist)  Recent office visits: 09/08/21 Elsie Stain, MD Change from Advair to Symbicort (AZ &B ME PAP); Stop: Brilinta. Start Plavix 75 mg.   09/02/21 Elsie Stain, MD BRBPR Referral to [facility] Change: Amlodipine 2.5 mg vs. 5 mg.  Recent consult visits: 09/02/21 Hortencia Pilar, MD (Vascular Surgery): Carotid artery stenosis. Procedure: Carotid Duplex  Hospital visits: None in previous 6 months   Objective:  Lab Results   Component Value Date   CREATININE 1.19 09/02/2021   BUN 14 09/02/2021   GFR 59.39 (L) 09/02/2021   GFRNONAA >60 05/05/2021   GFRAA 56 (L) 11/22/2019   NA 140 09/02/2021   K 4.4 09/02/2021   CALCIUM 9.6 09/02/2021   CO2 31 09/02/2021   GLUCOSE 150 (H) 09/02/2021    Lab Results  Component Value Date/Time   HGBA1C 10.0 (H) 09/02/2021 09:49 AM   HGBA1C 9.6 (H) 05/21/2021 08:19 AM   HGBA1C 7.1 (H) 10/25/2014 03:02 AM   FRUCTOSAMINE 267 09/02/2021 09:49 AM   FRUCTOSAMINE 278 05/21/2021 08:19 AM   GFR 59.39 (L) 09/02/2021 09:49 AM   GFR 60.73 05/21/2021 08:19 AM    Last diabetic Eye exam: No results found for: "HMDIABEYEEXA"  Last diabetic Foot exam: No results found for: "HMDIABFOOTEX"   Lab Results  Component Value Date   CHOL 99 02/10/2021   HDL 22.90 (L) 02/10/2021   LDLCALC UNABLE TO CALCULATE IF TRIGLYCERIDE OVER 400 mg/dL 09/03/2016   LDLDIRECT 39.0 02/10/2021   TRIG 283.0 (H) 02/10/2021   CHOLHDL 4 02/10/2021       Latest Ref Rng & Units 05/05/2021    1:51 PM 02/10/2021    7:40 AM 01/17/2020    8:17 AM  Hepatic Function  Total Protein 6.5 - 8.1 g/dL 7.1  6.5  6.1   Albumin 3.5 - 5.0 g/dL 4.2  4.0  4.0   AST 15 - 41 U/L 19  13  10    ALT 0 - 44 U/L 17  13  11    Alk Phosphatase 38 - 126 U/L 45  42  34  Total Bilirubin 0.3 - 1.2 mg/dL 0.8  0.6  0.6     Lab Results  Component Value Date/Time   TSH 2.390 05/11/2009 09:25 PM       Latest Ref Rng & Units 05/06/2021    7:50 AM 05/05/2021   10:29 PM 05/05/2021    1:51 PM  CBC  WBC 4.0 - 10.5 K/uL   7.8   Hemoglobin 13.0 - 17.0 g/dL 14.3  14.0  15.6   Hematocrit 39.0 - 52.0 %  41.2  45.1   Platelets 150 - 400 K/uL   206     No results found for: "VD25OH"  Clinical ASCVD: Yes  The ASCVD Risk score (Arnett DK, et al., 2019) failed to calculate for the following reasons:   The patient has a prior MI or stroke diagnosis       02/17/2022    1:03 PM 02/09/2021    8:21 AM 05/14/2020    9:16 AM  Depression  screen PHQ 2/9  Decreased Interest 0 0 0  Down, Depressed, Hopeless 0 0 0  PHQ - 2 Score 0 0 0  Altered sleeping 0 0   Tired, decreased energy 0 0   Change in appetite 0 0   Feeling bad or failure about yourself  0 0   Trouble concentrating 0 0   Moving slowly or fidgety/restless 0 0   Suicidal thoughts 0 0   PHQ-9 Score 0 0   Difficult doing work/chores Not difficult at all Not difficult at all      Social History   Tobacco Use  Smoking Status Former   Packs/day: 1.00   Years: 40.00   Total pack years: 40.00   Types: Cigarettes   Quit date: 10/21/1998   Years since quitting: 23.3  Smokeless Tobacco Never  Tobacco Comments   Past smoker, states he will smoke maybe 1 cigarette/ month or so still   BP Readings from Last 3 Encounters:  03/02/22 (!) 158/78  02/17/22 (!) 158/75  09/02/21 116/73   Pulse Readings from Last 3 Encounters:  03/02/22 73  02/17/22 (!) 56  09/02/21 73   Wt Readings from Last 3 Encounters:  03/02/22 202 lb (91.6 kg)  02/17/22 200 lb (90.7 kg)  02/17/22 200 lb 6.4 oz (90.9 kg)   BMI Readings from Last 3 Encounters:  03/02/22 30.71 kg/m  02/17/22 30.41 kg/m  02/17/22 30.47 kg/m    Assessment/Interventions: Review of patient past medical history, allergies, medications, health status, including review of consultants reports, laboratory and other test data, was performed as part of comprehensive evaluation and provision of chronic care management services.   SDOH:  (Social Determinants of Health) assessments and interventions performed: No  SDOH Screenings   Alcohol Screen: Low Risk  (02/17/2022)   Alcohol Screen    Last Alcohol Screening Score (AUDIT): 2  Depression (PHQ2-9): Low Risk  (02/17/2022)   Depression (PHQ2-9)    PHQ-2 Score: 0  Financial Resource Strain: Low Risk  (02/17/2022)   Overall Financial Resource Strain (CARDIA)    Difficulty of Paying Living Expenses: Not hard at all  Food Insecurity: No Food Insecurity (02/17/2022)    Hunger Vital Sign    Worried About Running Out of Food in the Last Year: Never true    Ran Out of Food in the Last Year: Never true  Housing: Low Risk  (02/17/2022)   Housing    Last Housing Risk Score: 0  Physical Activity: Insufficiently Active (02/17/2022)   Exercise Vital Sign  Days of Exercise per Week: 3 days    Minutes of Exercise per Session: 30 min  Social Connections: Moderately Integrated (02/17/2022)   Social Connection and Isolation Panel [NHANES]    Frequency of Communication with Friends and Family: More than three times a week    Frequency of Social Gatherings with Friends and Family: Once a week    Attends Religious Services: More than 4 times per year    Active Member of [employer] or Organizations: No    Attends Archivist Meetings: Never    Marital Status: Married  Stress: No Stress Concern Present (02/17/2022)   [place]    Feeling of Stress : Not at all  Tobacco Use: Medium Risk (03/02/2022)   Patient History    Smoking Tobacco Use: Former    Smokeless Tobacco Use: Never    Passive Exposure: Not on file  Transportation Needs: No Transportation Needs (02/17/2022)   PRAPARE - Hydrologist (Medical): No    Lack of Transportation (Non-Medical): No    CCM Care Plan  Allergies  Allergen Reactions   Augmentin [Amoxicillin-Pot Clavulanate] Other (See Comments)    Nausea,vomiting,diarrhea   Morphine And Related Nausea And Vomiting    Vomiting, GI upset   Metformin And Related Other (See Comments)    Intolerant of 2051m a day.     Medications Reviewed Today     Reviewed by LCarter Kitten CMA (Certified Medical Assistant) on 03/02/22 at 150 Med List Status: <None>   Medication Order Taking? Sig Documenting Provider Last Dose Status Informant  acetaminophen (TYLENOL) 325 MG tablet [id number] Take 1-2 tablets (325-650 mg total) by mouth every 4 (four)  hours as needed for mild pain (or temp >/= 101 F). CJuanna Cao MD  Active Spouse/Significant Other  albuterol ([facility]) 108 (90 BASE) MCG/ACT inhaler [id number] Inhale 2 puffs into the lungs 3 (three) times daily as needed (cough). DTonia Ghent MD  Active Spouse/Significant Other  amLODipine (NORVASC) 5 MG tablet [id number] Take 0.5 tablets (2.5 mg total) by mouth daily.  Patient taking differently: Take 5 mg by mouth daily.   DTonia Ghent MD  Active   aspirin EC 81 MG tablet [id number] Take 81 mg by mouth daily. Swallow whole. [provider]  Active   atorvastatin (LIPITOR) 40 MG tablet [id number] TAKE 1 TABLET(40 MG) BY MOUTH DAILY Gollan, TKathlene November MD  Active Spouse/Significant Other  blood glucose meter kit and supplies KIT [id number] 1 each by Does not apply route daily as needed. Dispense based on patient and insurance preference. Use up to four times daily as directed. (FOR ICD-9 250.00, 250.01). DTonia Ghent MD  Active Spouse/Significant Other  budesonide-formoterol ([facility] 160-4.5 MCG/ACT inhaler [id number] Inhale 2 puffs into the lungs 2 (two) times daily. DTonia Ghent MD  Active            Med Note (Lenord [place]ellers [place]   Tue Mar 01, 2022  9:04 AM) Via AZ&ME PAP  clopidogrel (PLAVIX) 75 MG tablet [id number] Take 1 tablet (75 mg total) by mouth daily. GMinna Merritts MD  Active   EPINEPHrine 0.3 mg/0.3 mL IJ SOAJ injection [id number] SMARTSIG:0.3 Milliliter(s) IM Once PRN [provider]  Active Spouse/Significant Other  famotidine (PEPCID) 20 MG tablet [id number] Take 1 tablet (20 mg total) by mouth 2 (two) times daily. SPaulette Blanch  MD  Active Spouse/Significant Other  fenofibrate (TRICOR) 145 MG tablet [id number]  TAKE 1 TABLET(145 MG) BY MOUTH DAILY Minna Merritts, MD  Active   glucosamine-chondroitin 500-400 MG tablet [id number]  Take 1 tablet by mouth 2 (two) times daily. [provider]  Active Spouse/Significant Other   glucose blood (ONETOUCH ULTRA) test strip [id number]  CHECK SUGAR TWICE DAILY Tonia Ghent, MD  Active Spouse/Significant Other  insulin glargine (LANTUS SOLOSTAR) 100 UNIT/ML Solostar Pen [id number]  ADMINISTER 90-100 UNITS UNDER THE SKIN DAILY Tonia Ghent, MD  Active Spouse/Significant Other           Med Note Charlton Haws   Tue Mar 01, 2022  9:05 AM) Via SANOFI PAP  Insulin Pen Needle (B-D UF III MINI PEN NEEDLES) 31G X 5 MM MISC [id number]  Use as instructed to inject insulin.  Diagnosis:  E11.9  Insulin dependent. Tonia Ghent, MD  Active Spouse/Significant Other  Lancets [facility] ULTRASOFT) lancets [id number]  Use as instructed to check sugar, dx E11.9. Tonia Ghent, MD  Active Spouse/Significant Other  lisinopril (ZESTRIL) 40 MG tablet [id number]  Take 1 tablet (40 mg total) by mouth daily. Minna Merritts, MD  Active Spouse/Significant Other  meclizine (ANTIVERT) 25 MG tablet [id number]  Take 25 mg by mouth every 6 (six) hours as needed. For dizziness/vertigo [provider]  Active Spouse/Significant Other  metFORMIN (GLUCOPHAGE) 500 MG tablet [id number]  TAKE 1 TABLET(500 MG) BY MOUTH TWICE DAILY WITH A MEAL Tonia Ghent, MD  Active   metoprolol tartrate (LOPRESSOR) 25 MG tablet [id number]  Take 0.5 tablets (12.5 mg total) by mouth 2 (two) times daily. Minna Merritts, MD  Active Spouse/Significant Other  Multiple Vitamins-Minerals (PRESERVISION AREDS 2 PO) [id number]  Take 1 tablet by mouth 2 (two) times daily. [provider]  Active Spouse/Significant Other  nitroGLYCERIN (NITROSTAT) 0.4 MG SL tablet [id number]  Place 1 tablet (0.4 mg total) under the tongue every 5 (five) minutes as needed. For chest pain Minna Merritts, MD  Active Spouse/Significant Other            Patient Active Problem List   Diagnosis Date Noted   Blood in stool 05/30/2021   Lower GI bleed    Tobacco abuse    Rectal bleeding 05/05/2021   Nicotine dependence  05/05/2021   Allergic reaction 03/24/2021   Health care maintenance 02/17/2021   Aortic ectasia, abdominal ([place]) 07/17/2019   Lower abdominal pain 06/27/2019   Carotid artery stenosis without cerebral infarction, left 09/21/2018   Abnormal dreams 08/05/2018   Creatinine elevation 08/05/2018   Dysuria 01/14/2018   CAD (coronary artery disease), native coronary artery 07/16/2017   Shoulder pain 12/30/2016   Skin lesion 12/30/2016   Unstable angina ([place]) 09/03/2016   AAA (abdominal aortic aneurysm) without rupture ([place]) 07/13/2016   Popliteal artery aneurysm ([place]) 07/13/2016   Carotid artery disease ([place]) 01/30/2015   NSTEMI (non-ST elevated myocardial infarction) ([place]) 11/16/2014   Medicare annual wellness visit, initial 10/21/2014   Advance care planning 10/21/2014   Atherosclerosis of native arteries of extremity with intermittent claudication ([place]) 09/09/2014   COPD exacerbation ([place]) 08/21/2014   Type 2 diabetes mellitus with hyperlipidemia ([place]) 05/24/2013   Back pain 02/24/2013   Cough 01/26/2011   COPD (chronic obstructive pulmonary disease) ([place]) 12/26/2010   FATIGUE 05/11/2009   Essential hypertension 05/08/2009   Hyperlipidemia 06/20/2008    Immunization History  Administered Date(s) Administered   Fluad Quad(high Dose 65+) 05/14/2020, 05/27/2021  Influenza,inj,Quad PF,6+ Mos 07/16/2013, 04/21/2014, 06/02/2015, 06/10/2016, 03/31/2017, 08/02/2018   PFIZER(Purple Top)SARS-COV-2 Vaccination 08/17/2019, 09/07/2019, 04/25/2020   Pneumococcal Conjugate-13 10/20/2014   Pneumococcal Polysaccharide-23 12/29/2011   Td 01/19/2010    Conditions to be addressed/monitored:  Hypertension, Hyperlipidemia, Diabetes, Coronary Artery Disease, and COPD  Care Plan : [place]  Updates made by Charlton Haws, [place] since 03/04/2022 12:00 AM     Problem: Hypertension, Hyperlipidemia, Diabetes, Coronary Artery Disease, and COPD   Priority: High     Long-Range Goal:  Disease mgmt   Start Date: 03/04/2022  Expected End Date: 03/05/2023  This Visit's Progress: On track  Priority: High  Note:   Current Barriers:  Hip pain Elevated BP  Pharmacist Clinical Goal(s):  Patient will contact provider office for questions/concerns as evidenced notation of same in electronic health record through collaboration with PharmD and provider.   Interventions: 1:1 collaboration with Tonia Ghent, MD regarding development and update of comprehensive plan of care as evidenced by provider attestation and co-signature Inter-disciplinary care team collaboration (see longitudinal plan of care) Comprehensive medication review performed; medication list updated in electronic medical record  Hypertension (BP goal <130/80) -Not ideally controlled- BP elevated in office -Current home readings: none available -Denies hypotensive/hypertensive symptoms -Current treatment: Lisinopril 40 mg daily - Appropriate, Query Effective Metoprolol tartrate 25 mg - 1/2 tablet twice daily -Appropriate, Query Effective Amlodipine 5 mg daily -Appropriate, Query Effective -Medications previously tried: HCTZ  -Educated on BP goals and benefits of medications for prevention of heart attack, stroke and kidney damage; -Counseled to monitor BP at home daily -Recommended to continue current medication  Diabetes (Fructosamine < 317 or A1C < 7%) -Controlled -  A1c is inaccurate, using fructosamine to monitor DM; last fructosamine 267 corresponds to A1c 6.1% -Current home glucose readings:  fasting: 126 today; range 110-140 -Denies hypoglycemic/hyperglycemic symptoms -Current medications: Lantus Solostar (PAP) 90 units daily - Appropriate, Effective, Safe, Accessible Metformin 500 mg BID - Appropriate, Effective, Safe, Accessible -Medications previously tried: glipizide, max dose metformin 1000 mg/day  -Current meal patterns:   breakfast: fried eggs   dinner: wife cooks meat and vegetables  -  he likes squash, green beans, peas, beans. Does not like leafy greens or other non-starchy vegetables. -Recommended to continue current medication  Hyperlipidemia: (LDL goal < 70) -Controlled - LDL 39 (02/2021) at goal -Hx CAD, PAD, stent -Current treatment: Atorvastatin 40 mg daily - Appropriate, Effective, Safe, Accessible Fenofibrate 145 mg daily -Appropriate, Effective, Safe, Accessible Clopidogrel 75 mg daily -Appropriate, Effective, Safe, Accessible Aspirin 81 mg daily -Appropriate, Effective, Safe, Accessible Nitroglycerin 0.4 mg SL prn -Appropriate, Effective, Safe, Accessible -Medications previously tried: Brilinta  -Educated on Cholesterol goals; Benefits of statin for ASCVD risk reduction; -Recommended to continue current medication  COPD (Goal: control symptoms and prevent exacerbations) -Controlled - wife says he wheezes occasionally, pt does not think so -Former smoker (quit 2000) -Gold Grade: unknown -Current COPD Classification:  A (low sx, <2 exacerbations/yr) -Pulmonary function testing: not on file -Exacerbations requiring treatment in last 6 months: 0 -Current treatment  Symbicort 160-4.5 mcg/act 2 puff BID (PAP) -Appropriate, Effective, Safe, Accessible Albuterol HFA prn -Appropriate, Effective, Safe, Accessible -Medications previously tried: n/a  -Patient reports consistent use of maintenance inhaler -Frequency of rescue inhaler use: rare -Counseled on Proper inhaler technique; Benefits of consistent maintenance inhaler use -Recommended to continue current medication  Patient Goals/Self-Care Activities Patient will:  - take medications as prescribed as evidenced by patient report and record review focus on  medication adherence by routine check glucose daily, document, and provide at future appointments check blood pressure daily, document, and provide at future appointments      Medication Assistance:  Lantus (Sanofi) - approved 2023 Symbicort (AZ&Me) -  approved 2023  Compliance/Adherence/Medication fill history: Care Gaps: Eye exam (due 08/11/21)  Star-Rating Drugs: Atorvastatin - PDC 91% Metformin - PDC 98% Lisinopril - PDC 94%  Medication Access: Within the past 30 days, how often has patient missed a dose of medication? 0 Is a pillbox or other method used to improve adherence? Yes  Factors that may affect medication adherence? no barriers identified Are meds synced by current pharmacy? No  Are meds delivered by current pharmacy? No  Does patient experience delays in picking up medications due to transportation concerns? No   [facility] Services Reviewed: Is patient disadvantaged to use [facility]?: Yes  Current Rx insurance plan: [place] Name and location of Current pharmacy:  [facility] [place], [place] - [place] [address] [place] [address] Phone: [phone] Fax: [place], [place]. [place] [address] Phone: [phone] Fax: [phone]  [facility] services reviewed with patient today?: No  Patient requests to transfer care to [facility] Pharmacy?: No  Reason patient declined to change pharmacies: Disadvantaged due to insurance/mail order   Care Plan and Follow Up Patient Decision:  Patient agrees to Care Plan and Follow-up.  Plan: Telephone follow up appointment with care management team member scheduled for:  3 months  Charlene Brooke, PharmD, BCACP Clinical Pharmacist [facility] at [facility] [phone]

## 2022-03-01 NOTE — Progress Notes (Addendum)
    Chronic Care Management Pharmacy Assistant   Name: [name]  MRN: [id number] DOB: 18-Oct-1945  Reason for Encounter: CCM (Lantus PAP Refill)   Sanofi is no longer doing fax refills. All patients are now on auto-refill. He should expect his medication delivery to the office within 10-14 business days. Called patient to inform him. Patient expressed understanding. I also gave patient [employer]i phone number for any questions or concerns.  Al Corpus, CPP notified  Claudina Lick, [place] Clinical Pharmacy Assistant [phone]

## 2022-03-02 ENCOUNTER — Ambulatory Visit (INDEPENDENT_AMBULATORY_CARE_PROVIDER_SITE_OTHER): Payer: Medicare Other | Admitting: Family Medicine

## 2022-03-02 ENCOUNTER — Encounter: Payer: Self-pay | Admitting: Family Medicine

## 2022-03-02 VITALS — BP 158/78 | HR 73 | Temp 98.0°F | Ht 68.0 in | Wt 202.0 lb

## 2022-03-02 DIAGNOSIS — M7062 Trochanteric bursitis, left hip: Secondary | ICD-10-CM

## 2022-03-02 DIAGNOSIS — M5137 Other intervertebral disc degeneration, lumbosacral region: Secondary | ICD-10-CM

## 2022-03-02 MED ORDER — TRIAMCINOLONE ACETONIDE 40 MG/ML IJ SUSP
40.0000 mg | Freq: Once | INTRAMUSCULAR | Status: AC
  Administered 2022-03-02: 40 mg via INTRA_ARTICULAR

## 2022-03-02 NOTE — Patient Instructions (Signed)
Hip Rehab:  Hip Flexion: Toe up to ceiling, laying on your back. Lift your whole leg, 3 sets. Work up to being able to do #30 with each set.  Hip elevations, Toe and leg turned out to side.  Lift whole leg, 3 sets. Work up to being able to do #30 with each set.  Hip Abductions: Lying on side, straight out to side. 3 sets, work up to being able to do #30 with each set.  At the beginning you may only be able to do a lot less, try to do #10.  

## 2022-03-02 NOTE — Progress Notes (Signed)
[name] T. [name], MD, [facility] [place], [address]  Phone: [phone]  FAX: [place] - 76 y.o. male  MRN [id number]  Date of Birth: 02/06/1946  Date: 03/02/2022  PCP: Tonia Ghent, MD  Referral: Tonia Ghent, MD  Chief Complaint  Patient presents with   Hip Pain    Left   Subjective:   [name] is a 76 y.o. very pleasant male patient with Body mass index is 30.71 kg/m. who presents with the following:  Ongoing L hip pain: He denies anterior groin pain.  No pain with rotational maneuvers of the hip.  He has predominant level of pain on the lateral aspect of the hip.  He points to the area of his trochanteric bursa as the region of maximal tenderness.  He also occasionally has some pain in the back, is not really flared up right now.  He does not have any pain in the lumbar spine region and he is did not have any kind of radicular pain.  No numbness or tingling distally.  He has not had any trauma or injury.  In the interval time period, he is also had a blockage in the back of his leg with a vein harvest all on the left side, as well.  GTB, hips weak, DDD  Review of Systems is noted in the HPI, as appropriate  Objective:   BP (!) 158/78   Pulse 73   Temp 98 F (36.7 C) (Oral)   Ht 5' 8" (1.727 m)   Wt 202 lb (91.6 kg)   SpO2 95%   BMI 30.71 kg/m   GEN: No acute distress; alert,appropriate. PULM: Breathing comfortably in no respiratory distress PSYCH: Normally interactive.    HIP EXAM: SIDE: L ROM: Abduction, Flexion, Internal and External range of motion: full  Pain with terminal IROM and EROM: none GTB: quite tender to palpation on yh rzl SLR: NEG Knees: No effusion FABER: NT REVERSE FABER: NT, neg Piriformis: NT at direct palpation Str: flexion: 4/5 abduction: 4-/5 adduction: 4/5 Strength testing tender at the hip  Throughout the  lumbar spine there is no significant tenderness.  He does have some modest tenderness in the upper posterior pelvic region to a lesser extent inferiorly.    Laboratory and Imaging Data: Plain hip films from March 2019 are reviewed and there is no significant degenerative change on the left hip reviewed at that time.  Assessment and Plan:     ICD-10-CM   1. Trochanteric bursitis of left hip  M70.62 triamcinolone acetonide (KENALOG-40) injection 40 mg    2. DDD (degenerative disc disease), lumbosacral  M51.37 triamcinolone acetonide (KENALOG-40) injection 40 mg     Acute trochanteric bursitis of the left hip.  Underlying all this I think that he likely does have some multilevel degenerative disc disease and he does have some referred pain into the left buttocks region.  Overarching all this, I think that his hips and support structures of gotten somewhat weak including in the thigh.  I think that he will have a much better time not having this recur if he gets stronger, and he agrees to do some home rehab.  Social: This is limiting his ability to walk for exercise  Patient Instructions  Hip Rehab:  Hip Flexion: Toe up to ceiling, laying on your back. Lift your whole leg, 3 sets. Work up to being able to do #  30 with each set.  Hip elevations, Toe and leg turned out to side.  Lift whole leg, 3 sets. Work up to being able to do #30 with each set.  Hip Abductions: Lying on side, straight out to side. 3 sets, work up to being able to do #30 with each set.  At the beginning you may only be able to do a lot less, try to do #10.    Aspiration/Injection Procedure Note [name] 1945/11/03 Date of procedure: 03/02/2022  Procedure: Large Joint Aspiration / Injection of Hip, Trochanteric Bursa, L Indications: Pain  Procedure Details Verbal consent obtained. Risks, benefits, and alternatives reviewed. Greater trochanter sterilely prepped with Chloraprep. Ethyl Chloride used for  anesthesia. 9 cc of Lidocaine 1% injected with 1 mL of Kenalog 40 mg into trochanteric bursa at area of maximal tenderness at greater trochanter. Needle taken to bone to troch bursa, flows easily. Bursa massaged. No bleeding and no complications. Decreased pain after injection. Needle: 22 gauge spinal needle Medication: 1 mL of Kenalog 40 mg   Medication Management during today's office visit: Meds ordered this encounter  Medications   triamcinolone acetonide (KENALOG-40) injection 40 mg   There are no discontinued medications.  Orders placed today for conditions managed today: No orders of the defined types were placed in this encounter.   Disposition: No follow-ups on file.  Dragon [employer] One speech-to-text software was used for transcription in this dictation.  Possible transcriptional errors can occur using Editor, commissioning.   Signed,  [name], MD   Outpatient Encounter Medications as of 03/02/2022  Medication Sig   acetaminophen (TYLENOL) 325 MG tablet Take 1-2 tablets (325-650 mg total) by mouth every 4 (four) hours as needed for mild pain (or temp >/= 101 F).   albuterol (PROAIR HFA) 108 (90 BASE) MCG/ACT inhaler Inhale 2 puffs into the lungs 3 (three) times daily as needed (cough).   amLODipine (NORVASC) 5 MG tablet Take 0.5 tablets (2.5 mg total) by mouth daily.   aspirin EC 81 MG tablet Take 81 mg by mouth daily. Swallow whole.   atorvastatin (LIPITOR) 40 MG tablet TAKE 1 TABLET(40 MG) BY MOUTH DAILY   blood glucose meter kit and supplies KIT 1 each by Does not apply route daily as needed. Dispense based on patient and insurance preference. Use up to four times daily as directed. (FOR ICD-9 250.00, 250.01).   budesonide-formoterol (SYMBICORT) 160-4.5 MCG/ACT inhaler Inhale 2 puffs into the lungs 2 (two) times daily.   clopidogrel (PLAVIX) 75 MG tablet Take 1 tablet (75 mg total) by mouth daily.   EPINEPHrine 0.3 mg/0.3 mL IJ SOAJ injection SMARTSIG:0.3  Milliliter(s) IM Once PRN   famotidine (PEPCID) 20 MG tablet Take 1 tablet (20 mg total) by mouth 2 (two) times daily.   fenofibrate (TRICOR) 145 MG tablet TAKE 1 TABLET(145 MG) BY MOUTH DAILY   glucosamine-chondroitin 500-400 MG tablet Take 1 tablet by mouth 2 (two) times daily.   glucose blood (ONETOUCH ULTRA) test strip CHECK SUGAR TWICE DAILY   insulin glargine (LANTUS SOLOSTAR) 100 UNIT/ML Solostar Pen ADMINISTER 90-100 UNITS UNDER THE SKIN DAILY   Insulin Pen Needle (B-D UF III MINI PEN NEEDLES) 31G X 5 MM MISC Use as instructed to inject insulin.  Diagnosis:  E11.9  Insulin dependent.   Lancets (ONETOUCH ULTRASOFT) lancets Use as instructed to check sugar, dx E11.9.   lisinopril (ZESTRIL) 40 MG tablet Take 1 tablet (40 mg total) by mouth daily.   meclizine (ANTIVERT) 25 MG  tablet Take 25 mg by mouth every 6 (six) hours as needed. For dizziness/vertigo   metFORMIN (GLUCOPHAGE) 500 MG tablet TAKE 1 TABLET(500 MG) BY MOUTH TWICE DAILY WITH A MEAL   metoprolol tartrate (LOPRESSOR) 25 MG tablet Take 0.5 tablets (12.5 mg total) by mouth 2 (two) times daily.   Multiple Vitamins-Minerals (PRESERVISION AREDS 2 PO) Take 1 tablet by mouth 2 (two) times daily.   nitroGLYCERIN (NITROSTAT) 0.4 MG SL tablet Place 1 tablet (0.4 mg total) under the tongue every 5 (five) minutes as needed. For chest pain   [EXPIRED] triamcinolone acetonide (KENALOG-40) injection 40 mg    No facility-administered encounter medications on file as of 03/02/2022.

## 2022-03-04 NOTE — Patient Instructions (Signed)
Visit Information  Phone number for Pharmacist: [phone]   Goals Addressed   None     Care Plan : CCM Pharmacy Care Plan  Updates made by Kathyrn Sheriff, RPH since 03/04/2022 12:00 AM     Problem: Hypertension, Hyperlipidemia, Diabetes, Coronary Artery Disease, and COPD   Priority: High     Long-Range Goal: Disease mgmt   Start Date: 03/04/2022  Expected End Date: 03/05/2023  This Visit's Progress: On track  Priority: High  Note:   Current Barriers:  Hip pain Elevated BP  Pharmacist Clinical Goal(s):  Patient will contact provider office for questions/concerns as evidenced notation of same in electronic health record through collaboration with PharmD and provider.   Interventions: 1:1 collaboration with Joaquim Nam, MD regarding development and update of comprehensive plan of care as evidenced by provider attestation and co-signature Inter-disciplinary care team collaboration (see longitudinal plan of care) Comprehensive medication review performed; medication list updated in electronic medical record  Hypertension (BP goal <130/80) -Not ideally controlled- BP elevated in office -Current home readings: none available -Denies hypotensive/hypertensive symptoms -Current treatment: Lisinopril 40 mg daily - Appropriate, Query Effective Metoprolol tartrate 25 mg - 1/2 tablet twice daily -Appropriate, Query Effective Amlodipine 5 mg daily -Appropriate, Query Effective -Medications previously tried: HCTZ  -Educated on BP goals and benefits of medications for prevention of heart attack, stroke and kidney damage; -Counseled to monitor BP at home daily -Recommended to continue current medication  Diabetes (Fructosamine < 317 or A1C < 7%) -Controlled -  A1c is inaccurate, using fructosamine to monitor DM; last fructosamine 267 corresponds to A1c 6.1% -Current home glucose readings:  fasting: 126 today; range 110-140 -Denies hypoglycemic/hyperglycemic  symptoms -Current medications: Lantus Solostar (PAP) 90 units daily - Appropriate, Effective, Safe, Accessible Metformin 500 mg BID - Appropriate, Effective, Safe, Accessible -Medications previously tried: glipizide, max dose metformin 1000 mg/day  -Current meal patterns:   breakfast: fried eggs   dinner: wife cooks meat and vegetables  - he likes squash, green beans, peas, beans. Does not like leafy greens or other non-starchy vegetables. -Recommended to continue current medication  Hyperlipidemia: (LDL goal < 70) -Controlled - LDL 39 (02/2021) at goal -Hx CAD, PAD, stent -Current treatment: Atorvastatin 40 mg daily - Appropriate, Effective, Safe, Accessible Fenofibrate 145 mg daily -Appropriate, Effective, Safe, Accessible Clopidogrel 75 mg daily -Appropriate, Effective, Safe, Accessible Aspirin 81 mg daily -Appropriate, Effective, Safe, Accessible Nitroglycerin 0.4 mg SL prn -Appropriate, Effective, Safe, Accessible -Medications previously tried: Brilinta  -Educated on Cholesterol goals; Benefits of statin for ASCVD risk reduction; -Recommended to continue current medication  COPD (Goal: control symptoms and prevent exacerbations) -Controlled - wife says he wheezes occasionally, pt does not think so -Former smoker (quit 2000) -Gold Grade: unknown -Current COPD Classification:  A (low sx, <2 exacerbations/yr) -Pulmonary function testing: not on file -Exacerbations requiring treatment in last 6 months: 0 -Current treatment  Symbicort 160-4.5 mcg/act 2 puff BID (PAP) -Appropriate, Effective, Safe, Accessible Albuterol HFA prn -Appropriate, Effective, Safe, Accessible -Medications previously tried: n/a  -Patient reports consistent use of maintenance inhaler -Frequency of rescue inhaler use: rare -Counseled on Proper inhaler technique; Benefits of consistent maintenance inhaler use -Recommended to continue current medication  Patient Goals/Self-Care Activities Patient will:  -  take medications as prescribed as evidenced by patient report and record review focus on medication adherence by routine check glucose daily, document, and provide at future appointments check blood pressure daily, document, and provide at future appointments      Patient  verbalizes understanding of instructions and care plan provided today and agrees to view in MyChart. Active MyChart status and patient understanding of how to access instructions and care plan via MyChart confirmed with patient.    Telephone follow up appointment with pharmacy team member scheduled for: 3 months  Al Corpus, PharmD, [facility] Clinical Pharmacist [facility] [phone]

## 2022-03-08 DIAGNOSIS — H353221 Exudative age-related macular degeneration, left eye, with active choroidal neovascularization: Secondary | ICD-10-CM | POA: Diagnosis not present

## 2022-03-08 DIAGNOSIS — H43391 Other vitreous opacities, right eye: Secondary | ICD-10-CM | POA: Diagnosis not present

## 2022-03-08 DIAGNOSIS — H353213 Exudative age-related macular degeneration, right eye, with inactive scar: Secondary | ICD-10-CM | POA: Diagnosis not present

## 2022-03-08 DIAGNOSIS — H43813 Vitreous degeneration, bilateral: Secondary | ICD-10-CM | POA: Diagnosis not present

## 2022-03-15 ENCOUNTER — Other Ambulatory Visit: Payer: Self-pay | Admitting: Family Medicine

## 2022-03-15 DIAGNOSIS — E1169 Type 2 diabetes mellitus with other specified complication: Secondary | ICD-10-CM

## 2022-03-16 NOTE — Telephone Encounter (Signed)
Please call patient and schedule appointment as instructed. 

## 2022-03-16 NOTE — Telephone Encounter (Signed)
Noted  

## 2022-03-16 NOTE — Telephone Encounter (Signed)
Patient has been scheduled

## 2022-03-16 NOTE — Telephone Encounter (Signed)
Please schedule yearly visit this fall when possible.  Labs ahead of time if possible.  Thanks.

## 2022-03-16 NOTE — Telephone Encounter (Signed)
Patient's last office visit with PCP 09/02/21. Last AMW with health coach 02/17/22.  When does patient need a physical or office visit with PCP?

## 2022-03-23 ENCOUNTER — Other Ambulatory Visit (INDEPENDENT_AMBULATORY_CARE_PROVIDER_SITE_OTHER): Payer: Medicare Other

## 2022-03-23 DIAGNOSIS — E785 Hyperlipidemia, unspecified: Secondary | ICD-10-CM

## 2022-03-23 DIAGNOSIS — E1169 Type 2 diabetes mellitus with other specified complication: Secondary | ICD-10-CM

## 2022-03-23 LAB — COMPREHENSIVE METABOLIC PANEL
ALT: 15 U/L (ref 0–53)
AST: 15 U/L (ref 0–37)
Albumin: 3.9 g/dL (ref 3.5–5.2)
Alkaline Phosphatase: 35 U/L — ABNORMAL LOW (ref 39–117)
BUN: 22 mg/dL (ref 6–23)
CO2: 30 mEq/L (ref 19–32)
Calcium: 9.6 mg/dL (ref 8.4–10.5)
Chloride: 103 mEq/L (ref 96–112)
Creatinine, Ser: 1.28 mg/dL (ref 0.40–1.50)
GFR: 54.2 mL/min — ABNORMAL LOW (ref >60.00)
Glucose, Bld: 89 mg/dL (ref 70–99)
Potassium: 3.8 mEq/L (ref 3.5–5.1)
Sodium: 142 mEq/L (ref 135–145)
Total Bilirubin: 0.5 mg/dL (ref 0.2–1.2)
Total Protein: 6.5 g/dL (ref 6.0–8.3)

## 2022-03-23 LAB — LIPID PANEL
Cholesterol: 105 mg/dL (ref 0–200)
HDL: 31.4 mg/dL — ABNORMAL LOW (ref >39.00)
LDL Cholesterol: 43 mg/dL (ref 0–99)
NonHDL: 73.62
Total CHOL/HDL Ratio: 3
Triglycerides: 152 mg/dL — ABNORMAL HIGH (ref 0.0–149.0)
VLDL: 30.4 mg/dL (ref 0.0–40.0)

## 2022-03-23 LAB — CBC WITH DIFFERENTIAL/PLATELET
Basophils Absolute: 0.1 10*3/uL (ref 0.0–0.1)
Basophils Relative: 1 % (ref 0.0–3.0)
Eosinophils Absolute: 0.3 10*3/uL (ref 0.0–0.7)
Eosinophils Relative: 3.6 % (ref 0.0–5.0)
HCT: 45.3 % (ref 39.0–52.0)
Hemoglobin: 15.4 g/dL (ref 13.0–17.0)
Lymphocytes Relative: 27.6 % (ref 12.0–46.0)
Lymphs Abs: 2 10*3/uL (ref 0.7–4.0)
MCHC: 34 g/dL (ref 30.0–36.0)
MCV: 92.6 fl (ref 78.0–100.0)
Monocytes Absolute: 0.6 10*3/uL (ref 0.1–1.0)
Monocytes Relative: 8.4 % (ref 3.0–12.0)
Neutro Abs: 4.3 10*3/uL (ref 1.4–7.7)
Neutrophils Relative %: 59.4 % (ref 43.0–77.0)
Platelets: 163 10*3/uL (ref 150.0–400.0)
RBC: 4.89 Mil/uL (ref 4.22–5.81)
RDW: 14.9 % (ref 11.5–15.5)
WBC: 7.2 10*3/uL (ref 4.0–10.5)

## 2022-03-23 LAB — HEMOGLOBIN A1C: Hgb A1c MFr Bld: 8.9 % — ABNORMAL HIGH (ref 4.6–6.5)

## 2022-03-24 ENCOUNTER — Telehealth: Payer: Self-pay

## 2022-03-24 NOTE — Progress Notes (Signed)
Chronic Care Management Pharmacy Assistant   Name: BREWER HITCHMAN  MRN: [id number] DOB: March 12, 1946  Reason for Encounter: CCM (Hypertension Disease State)  Recent office visits:  03/02/22 Owens Loffler, MD Trochanteric bursitis of left hip Administered: triamcinolone acetonide (KENALOG-40) injection 40 mg  Recent consult visits:  None since last CCM contact  Hospital visits:  None in previous 6 months  Medications: Outpatient Encounter Medications as of 03/24/2022  Medication Sig Note   acetaminophen (TYLENOL) 325 MG tablet Take 1-2 tablets (325-650 mg total) by mouth every 4 (four) hours as needed for mild pain (or temp >/= 101 F).    albuterol (PROAIR HFA) 108 (90 BASE) MCG/ACT inhaler Inhale 2 puffs into the lungs 3 (three) times daily as needed (cough).    amLODipine (NORVASC) 5 MG tablet Take 0.5 tablets (2.5 mg total) by mouth daily.    aspirin EC 81 MG tablet Take 81 mg by mouth daily. Swallow whole.    atorvastatin (LIPITOR) 40 MG tablet TAKE 1 TABLET(40 MG) BY MOUTH DAILY    blood glucose meter kit and supplies KIT 1 each by Does not apply route daily as needed. Dispense based on patient and insurance preference. Use up to four times daily as directed. (FOR ICD-9 250.00, 250.01).    budesonide-formoterol (SYMBICORT) 160-4.5 MCG/ACT inhaler Inhale 2 puffs into the lungs 2 (two) times daily. 03/01/2022: Via [facility] PAP   clopidogrel (PLAVIX) 75 MG tablet Take 1 tablet (75 mg total) by mouth daily.    EPINEPHrine 0.3 mg/0.3 mL IJ SOAJ injection SMARTSIG:0.3 Milliliter(s) IM Once PRN    famotidine (PEPCID) 20 MG tablet Take 1 tablet (20 mg total) by mouth 2 (two) times daily.    fenofibrate (TRICOR) 145 MG tablet TAKE 1 TABLET(145 MG) BY MOUTH DAILY    glucosamine-chondroitin 500-400 MG tablet Take 1 tablet by mouth 2 (two) times daily.    glucose blood (ONETOUCH ULTRA) test strip CHECK SUGAR TWICE DAILY    insulin glargine (LANTUS SOLOSTAR) 100 UNIT/ML Solostar Pen ADMINISTER  90-100 UNITS UNDER THE SKIN DAILY 03/01/2022: Via [facility] PAP   Insulin Pen Needle (B-D UF III MINI PEN NEEDLES) 31G X 5 MM MISC USE AS INSTRUCTED TO INJECT INSULIN    Lancets (ONETOUCH ULTRASOFT) lancets Use as instructed to check sugar, dx E11.9.    lisinopril (ZESTRIL) 40 MG tablet Take 1 tablet (40 mg total) by mouth daily.    meclizine (ANTIVERT) 25 MG tablet Take 25 mg by mouth every 6 (six) hours as needed. For dizziness/vertigo    metFORMIN (GLUCOPHAGE) 500 MG tablet TAKE 1 TABLET(500 MG) BY MOUTH TWICE DAILY WITH A MEAL    metoprolol tartrate (LOPRESSOR) 25 MG tablet Take 0.5 tablets (12.5 mg total) by mouth 2 (two) times daily.    Multiple Vitamins-Minerals (PRESERVISION AREDS 2 PO) Take 1 tablet by mouth 2 (two) times daily.    nitroGLYCERIN (NITROSTAT) 0.4 MG SL tablet Place 1 tablet (0.4 mg total) under the tongue every 5 (five) minutes as needed. For chest pain    No facility-administered encounter medications on file as of 03/24/2022.    Recent Office Vitals: BP Readings from Last 3 Encounters:  03/02/22 (!) 158/78  02/17/22 (!) 158/75  09/02/21 116/73   Pulse Readings from Last 3 Encounters:  03/02/22 73  02/17/22 (!) 56  09/02/21 73    Wt Readings from Last 3 Encounters:  03/02/22 202 lb (91.6 kg)  02/17/22 200 lb (90.7 kg)  02/17/22 200 lb 6.4 oz (90.9 kg)  Kidney Function Lab Results  Component Value Date/Time   CREATININE 1.28 03/23/2022 07:36 AM   CREATININE 1.19 09/02/2021 09:49 AM   CREATININE 1.32 (H) 10/29/2014 09:08 AM   CREATININE 1.40 (H) 10/28/2014 02:45 AM   CREATININE 0.96 01/28/2011 08:55 AM   CREATININE 1.16 01/07/2011 08:35 AM   GFR 54.20 (L) 03/23/2022 07:36 AM   GFRNONAA >60 05/05/2021 01:51 PM   GFRNONAA 55 (L) 10/29/2014 09:08 AM   GFRAA 56 (L) 11/22/2019 09:52 AM   GFRAA >60 10/29/2014 09:08 AM       Latest Ref Rng & Units 03/23/2022    7:36 AM 09/02/2021    9:49 AM 05/21/2021    8:19 AM  BMP  Glucose 70 - 99 mg/dL 89  150  137    BUN 6 - 23 mg/dL 22  14  18    Creatinine 0.40 - 1.50 mg/dL 1.28  1.19  1.17   Sodium 135 - 145 mEq/L 142  140  141   Potassium 3.5 - 5.1 mEq/L 3.8  4.4  4.3   Chloride 96 - 112 mEq/L 103  103  104   CO2 19 - 32 mEq/L 30  31  29    Calcium 8.4 - 10.5 mg/dL 9.6  9.6  9.9      Contacted patient on 03/29/2022 to discuss hypertension disease state  Current antihypertensive regimen:  Lisinopril 40 mg daily  Metoprolol tartrate 25 mg - 1/2 tablet twice daily  Amlodipine 5 mg daily  Patient verbally confirms he is taking the above medications as directed. Yes  How often are you checking your Blood Pressure? 3-5x per week  Current home BP readings: I have asked patient to take their blood pressure daily and keep a log. Advised patient I would call back on 04/01/2022 for log. Patient verbalized understanding and agreed.   Wrist or arm cuff: Arm Caffeine intake: Patient does not drink caffeine Salt intake: Patient watches his salt intake very carefully Over the counter medications including pseudoephedrine or NSAIDs? No  What recent interventions/DTPs have been made by any provider to improve Blood Pressure control since last CPP Visit: No recent interventions  Any recent hospitalizations or ED visits since last visit with CPP? No  Adherence Review: Is the patient currently on ACE/ARB medication? Yes Does the patient have >5 day gap between last estimated fill dates? No - Atorvastatin 40 mg due for refill today.   Star Rating Drugs:  Medication:                Last Fill:         Day Supply Atorvastatin 40 mg      02/21/2022      90 Lisinopril 40 mg           01/24/2022      90         Metformin 500 mg       01/05/2022 90  Care Gaps: Annual wellness visit in last year? Yes 02/17/2022 Most Recent BP reading: 158/78 on 03/02/22  If Diabetic: Most recent A1C reading: 8.9 on 03/23/22 Last eye exam / retinopathy screening: 08/11/2020 Last diabetic foot exam: Up to date  Upcoming  appointments: PCP appointment on 04/07/2022 for Physical Cardiology 04/29/2022 CCM 05/27/2022  Charlene Brooke, CPP notified  Marijean Niemann, [place] Assistant [phone]

## 2022-03-27 ENCOUNTER — Telehealth: Payer: Self-pay | Admitting: Family Medicine

## 2022-03-27 LAB — FRUCTOSAMINE: Fructosamine: 252 umol/L (ref 205–285)

## 2022-03-27 NOTE — Telephone Encounter (Signed)
Called pt, talked to his wife.  Please call pt to reschedule. I will be out of clinic due to an illness. Thanks.

## 2022-03-28 ENCOUNTER — Encounter: Payer: Medicare Other | Admitting: Family Medicine

## 2022-03-28 NOTE — Telephone Encounter (Signed)
Appt rescheduled to 04/07/22 at 2:00 pm.

## 2022-04-07 ENCOUNTER — Ambulatory Visit (INDEPENDENT_AMBULATORY_CARE_PROVIDER_SITE_OTHER): Payer: Medicare Other | Admitting: Family Medicine

## 2022-04-07 ENCOUNTER — Encounter: Payer: Self-pay | Admitting: Family Medicine

## 2022-04-07 VITALS — BP 120/66 | HR 66 | Temp 97.7°F | Ht 68.0 in | Wt 202.0 lb

## 2022-04-07 DIAGNOSIS — I10 Essential (primary) hypertension: Secondary | ICD-10-CM

## 2022-04-07 DIAGNOSIS — E1169 Type 2 diabetes mellitus with other specified complication: Secondary | ICD-10-CM

## 2022-04-07 DIAGNOSIS — Z7189 Other specified counseling: Secondary | ICD-10-CM

## 2022-04-07 DIAGNOSIS — Z23 Encounter for immunization: Secondary | ICD-10-CM

## 2022-04-07 DIAGNOSIS — E785 Hyperlipidemia, unspecified: Secondary | ICD-10-CM

## 2022-04-07 DIAGNOSIS — L989 Disorder of the skin and subcutaneous tissue, unspecified: Secondary | ICD-10-CM | POA: Diagnosis not present

## 2022-04-07 DIAGNOSIS — E782 Mixed hyperlipidemia: Secondary | ICD-10-CM

## 2022-04-07 DIAGNOSIS — J432 Centrilobular emphysema: Secondary | ICD-10-CM

## 2022-04-07 DIAGNOSIS — Z0000 Encounter for general adult medical examination without abnormal findings: Secondary | ICD-10-CM

## 2022-04-07 NOTE — Progress Notes (Signed)
Diabetes:  Using medications without difficulties: yes Hypoglycemic episodes:no Hyperglycemic episodes: no Feet problems: no Blood Sugars averaging: ~90-150, but usually ~120.   eye exam within last year: f/u pending for tomorrow.  He is going monthly.  [facility].   Fructosamine converts to A1c 5.89. 90 units per day.    Elevated Cholesterol: Using medications without problems:yes Muscle aches: no Diet compliance: yes Exercise: yes  Hypertension:    Using medication without problems or lightheadedness: yes Chest pain with exertion:no Edema:L leg at baseline from prev surgery.   Short of breath:no  COPD.  Still on symbicort at baseline.  No wheeze noted by patient. Rare use of SABA.    3 lesions on the L forearm prev frozen, they were improving and nearly resolved but then enlarged again in the meantime.  We talked about retreatment today, he consented.  We talked about options with dermatology referral versus retreatment with liquid nitrogen.  Colonoscopy d/w pt, would defer age >85.  We talked about prev episode but he has gone ~1 year w/o sx and he wanted to defer at this point.  This may be reasonable given his cardiac sx.  No bloody or black stools.   Tetanus and shingles may be cheaper at pharmacy, d/w pt.   Flu d/w pt.   PNA up to date.   Prostate cancer screening and PSA options (with potential risks and benefits of testing vs not testing) were discussed along with recent recs/guidelines.  He declined testing PSA at this point. Advance directive- wife designated if patient were incapacitated.   PMH and SH reviewed  Meds, vitals, and allergies reviewed.   ROS: Per HPI unless specifically indicated in ROS section   GEN: nad, alert and oriented HEENT: ncat NECK: supple w/o LA CV: rrr. PULM: ctab, no inc wob ABD: soft, +bs EXT: no edema on right leg, minimal edema on left leg at baseline. SKIN: no acute rash  3 lesion on L forearm.  0.5cm, 0.5cm, 1cm.  All  small papular lesions, the 1cm distal lesion is irritated.    Diabetic foot exam: Normal inspection No skin breakdown No calluses  Normal DP pulses Normal sensation to light touch and monofilament Nails normal

## 2022-04-07 NOTE — Patient Instructions (Addendum)
If you have any low sugars then cut insulin back by 1-2 units per day.  If the skin spots don't heal over totally, then let me know.  They should blister up in a few days.  Cover if needed.   Recheck labs prior to a visit in about 6 months, sooner if needed.   Take care.  Glad to see you.

## 2022-04-08 DIAGNOSIS — H43813 Vitreous degeneration, bilateral: Secondary | ICD-10-CM | POA: Diagnosis not present

## 2022-04-08 DIAGNOSIS — H348312 Tributary (branch) retinal vein occlusion, right eye, stable: Secondary | ICD-10-CM | POA: Diagnosis not present

## 2022-04-08 DIAGNOSIS — H353213 Exudative age-related macular degeneration, right eye, with inactive scar: Secondary | ICD-10-CM | POA: Diagnosis not present

## 2022-04-08 DIAGNOSIS — H353221 Exudative age-related macular degeneration, left eye, with active choroidal neovascularization: Secondary | ICD-10-CM | POA: Diagnosis not present

## 2022-04-08 NOTE — Assessment & Plan Note (Signed)
Fructosamine converts to A1c 5.89. Current dose of insulin is 90 units per day.   I suspect his fructosamine is more accurate than his A1c.  If he has any low sugars at home that I want him to taper his insulin dose by 1 to 2 units/day.  Continue metformin for now.  He agrees with plan.  Plan on recheck in about 6 months.

## 2022-04-08 NOTE — Assessment & Plan Note (Signed)
Colonoscopy d/w pt, would defer age >22.  We talked about prev episode but he has gone ~1 year w/o sx and he wanted to defer at this point.  This may be reasonable given his cardiac sx.  No bloody or black stools.   Tetanus and shingles may be cheaper at pharmacy, d/w pt.   Flu d/w pt.   PNA up to date.   Prostate cancer screening and PSA options (with potential risks and benefits of testing vs not testing) were discussed along with recent recs/guidelines.  He declined testing PSA at this point. Advance directive- wife designated if patient were incapacitated.

## 2022-04-08 NOTE — Assessment & Plan Note (Signed)
Continue amlodipine lisinopril and metoprolol.  He will update me as needed.  Recent labs discussed with patient.

## 2022-04-08 NOTE — Assessment & Plan Note (Signed)
Advance directive- wife designated if patient were incapacitated.  

## 2022-04-08 NOTE — Assessment & Plan Note (Signed)
All frozen x3 with liquid nitrogen.  If he has complete resolution then he would need any extra treatment.  If he has any residual lesion after the blister and heal over then he can let me know and we can refer to dermatology if needed.  He tolerated cryotherapy well and he consented for the procedure.

## 2022-04-08 NOTE — Assessment & Plan Note (Signed)
Still on symbicort at baseline.  No wheeze noted by patient. Rare use of SABA.  Would continue Symbicort as is with as needed albuterol.

## 2022-04-08 NOTE — Assessment & Plan Note (Signed)
continue fenofibrate and Lipitor.  Labs discussed with patient.

## 2022-04-13 ENCOUNTER — Other Ambulatory Visit: Payer: Self-pay | Admitting: Family Medicine

## 2022-04-19 ENCOUNTER — Other Ambulatory Visit: Payer: Self-pay | Admitting: Family Medicine

## 2022-04-28 NOTE — Progress Notes (Signed)
Date:  04/29/2022   ID:  [name], DOB 04/02/1946, MRN [id number]  Patient Location:  [address] [place] [address]   Provider location:   [facility], [place] office  PCP:  Tonia Ghent, MD  Cardiologist:  Patsy Baltimore  Chief Complaint  Patient presents with   12 month follow up     "Doing well." Medications reviewed by the patient verbally.      History of Present Illness:    [name] is a 76 y.o. male past medical history of coronary artery disease,  diabetes,  PAD,  prior stent to the LAD and RCA, 2016 left femoropopliteal bypass with repair of aneurysm,  long history of smoking for 50 years,  quit (rare smoking) non-STEMI 10/24/2014  anginal equivalent, scapular pain between his shoulder blades. carotid disease on the left, s/p CEA Prior hx of renal failure 2020, normal normalized CR 1.2 who presents for routine follow-up of his coronary artery disease.   Last seen by myself in clinic October 2022  Vision problerms, macular degeneration Can not drive Drives to [facility] for treatment  Lab work reviewed A1c 8.9 Total cholesterol 105 LDL 43  Sugars this AM 120 to 150  Blood pressure elevated today, 791 systolic, 505 on repeat check With PMD was 120/66 last month  poor diet in general in the past Sedentary, he conditioned  Followed by vascular, dr. Ronalee Belts, follows lower extremities Left calf pain at times, lateral leg Right: No significant popliteal artery dilatation.  Left: 75-99% stenosis noted in the deep femoral artery. Patent left  fem-pop bypass graft.   No angina, Chronic mild shortness of breath on exertion For COPD takes inhaler  BID Insulin shot BID  EKG personally reviewed by myself on todays visit NSR rate 63 bpm no ST ro t wave changes  Other past medical history   presented to the hospital October 24 2014 with pain similar to his previous anginal equivalent, scapular pain between his  shoulder blades. He had cardiac catheterization April 18 that showed chronically occluded mid LAD, severe disease of the distal RCA as well as PDA branch. He had DES to the distal RCA. In the holding area, he developed chest discomfort and was taken back to the cardiac catheterization lab that showed occluded PDA lesion which was stented.   Past Medical History:  Diagnosis Date   CAD (coronary artery disease)    a. 1999: PCI-->LAD 2/2 MI; b. 2005: inf MI s/p PCI/DES x 4 to RCA; c. Myoview in 12/2005: EF 61%, no evidence for ischemia; d. cath 10/2014: occluded mLAD w/ L to L and L to R collats, dRCA 95% s/p PCI/DES 0%, mPDA 70%, LCx mild to mod irregs, procedure complicated by inf ST ele in recovery, repeat cath showed acute dRCA stent thrombosis o/w occluded mRPDA, PTCA dRCA, PCI/DES RPDA, aggrastat x 18 hr   CKD (chronic kidney disease)    COPD (chronic obstructive pulmonary disease) (HCC)    DM2 (diabetes mellitus, type 2) ([place])    H/O hiatal hernia    Heart attack ([place]) [id number]   had 2 in one day(released plaque during angio). 5 stents total   HTN (hypertension)    Hypercholesterolemia    Impaired fasting glucose    Elevated after steroid injection   Macular degeneration of right eye 2020   receiving injections into eye   Osteoarthritis of hip    injections in both hips   Osteoarthritis,  knee    PAD (peripheral artery disease) (HCC)    L fem to below the knee popliteal vein bypass graft   Peri-rectal abscess 12/27/2011   Tobacco abuse    Prior   Past Surgical History:  Procedure Laterality Date   Cook, 2006, 2016 x 2   Multiple, LAD in 1999, RCA in 2005.  has a total of 4 stents   ENDARTERECTOMY Left 09/21/2018   Procedure: ENDARTERECTOMY CAROTID;  Surgeon: Katha Cabal, MD;  Location: [facility] ORS;  Service: Vascular;  Laterality: Left;   FEMORAL-POPLITEAL BYPASS GRAFT  2016   L fem to below the knee popliteal vein  bypass graft   INCISE AND DRAIN ABCESS  2007   on scrotum   INCISION AND DRAINAGE PERIRECTAL ABSCESS  12/28/2011   Procedure: [place];  Surgeon: Luella Cook III, MD;  Location: [place];  Service: General;  Laterality: N/A;    Allergies:   Augmentin [amoxicillin-pot clavulanate], Morphine and related, and Metformin and related   Social History   Tobacco Use   Smoking status: Former    Packs/day: 1.00    Years: 40.00    Total pack years: 40.00    Types: Cigarettes    Quit date: 10/21/1998    Years since quitting: 23.5   Smokeless tobacco: Never   Tobacco comments:    Past smoker, states he will smoke maybe 1 cigarette/ month or so still  Vaping Use   Vaping Use: Never used  Substance Use Topics   Alcohol use: Not Currently    Alcohol/week: 0.0 standard drinks of alcohol    Comment: Occasional beer on the weekends   Drug use: No     Current Outpatient Medications on File Prior to Visit  Medication Sig Dispense Refill   acetaminophen (TYLENOL) 325 MG tablet Take 1-2 tablets (325-650 mg total) by mouth every 4 (four) hours as needed for mild pain (or temp >/= 101 F). 100 tablet 0   albuterol (PROAIR HFA) 108 (90 BASE) MCG/ACT inhaler Inhale 2 puffs into the lungs 3 (three) times daily as needed (cough). 18 g 2   amLODipine (NORVASC) 5 MG tablet Take 0.5 tablets (2.5 mg total) by mouth daily.     aspirin EC 81 MG tablet Take 81 mg by mouth daily. Swallow whole.     atorvastatin (LIPITOR) 40 MG tablet TAKE 1 TABLET(40 MG) BY MOUTH DAILY 90 tablet 3   blood glucose meter kit and supplies KIT 1 each by Does not apply route daily as needed. Dispense based on patient and insurance preference. Use up to four times daily as directed. (FOR ICD-9 250.00, 250.01). 1 each 0   budesonide-formoterol (SYMBICORT) 160-4.5 MCG/ACT inhaler Inhale 2 puffs into the lungs 2 (two) times daily. 3 each 3   clopidogrel (PLAVIX) 75 MG tablet Take 1 tablet (75 mg total) by  mouth daily. 90 tablet 3   EPINEPHrine 0.3 mg/0.3 mL IJ SOAJ injection SMARTSIG:0.3 Milliliter(s) IM Once PRN     famotidine (PEPCID) 20 MG tablet Take 1 tablet (20 mg total) by mouth 2 (two) times daily. 8 tablet 0   fenofibrate (TRICOR) 145 MG tablet TAKE 1 TABLET(145 MG) BY MOUTH DAILY 30 tablet 5   glucosamine-chondroitin 500-400 MG tablet Take 1 tablet by mouth 2 (two) times daily.     glucose blood (ONETOUCH ULTRA) test strip USE TO CHECK BLOOD SUGAR TWICE DAILY 100 strip 4  insulin glargine (LANTUS SOLOSTAR) 100 UNIT/ML Solostar Pen ADMINISTER 90-100 UNITS UNDER THE SKIN DAILY 90 mL 4   Insulin Pen Needle (B-D UF III MINI PEN NEEDLES) 31G X 5 MM MISC USE AS INSTRUCTED TO INJECT INSULIN 100 each 0   Lancets (ONETOUCH ULTRASOFT) lancets Use as instructed to check sugar, dx E11.9. 100 each 12   lisinopril (ZESTRIL) 40 MG tablet Take 1 tablet (40 mg total) by mouth daily. 90 tablet 3   meclizine (ANTIVERT) 25 MG tablet Take 25 mg by mouth every 6 (six) hours as needed. For dizziness/vertigo     metFORMIN (GLUCOPHAGE) 500 MG tablet TAKE 1 TABLET(500 MG) BY MOUTH TWICE DAILY WITH A MEAL 180 tablet 1   metoprolol tartrate (LOPRESSOR) 25 MG tablet Take 0.5 tablets (12.5 mg total) by mouth 2 (two) times daily. 90 tablet 3   Multiple Vitamins-Minerals (PRESERVISION AREDS 2 PO) Take 1 tablet by mouth 2 (two) times daily.     nitroGLYCERIN (NITROSTAT) 0.4 MG SL tablet Place 1 tablet (0.4 mg total) under the tongue every 5 (five) minutes as needed. For chest pain 25 tablet 2   No current facility-administered medications on file prior to visit.     Family Hx: The patient's family history includes CAD in his mother; Diabetes in his mother; Heart failure in his father. There is no history of Colon cancer or Prostate cancer.  ROS:   Please see the history of present illness.    Review of Systems  Constitutional: Negative.   HENT: Negative.    Respiratory: Negative.    Cardiovascular: Negative.    Gastrointestinal: Negative.   Musculoskeletal:  Positive for joint pain.  Neurological: Negative.   Psychiatric/Behavioral: Negative.    All other systems reviewed and are negative.    Labs/Other Tests and Data Reviewed:    Recent Labs: 03/23/2022: ALT 15; BUN 22; Creatinine, Ser 1.28; Hemoglobin 15.4; Platelets 163.0; Potassium 3.8; Sodium 142   Recent Lipid Panel Lab Results  Component Value Date/Time   CHOL 105 03/23/2022 07:36 AM   CHOL 135 10/25/2014 03:02 AM   TRIG 152.0 (H) 03/23/2022 07:36 AM   TRIG 434 (H) 10/25/2014 03:02 AM   HDL 31.40 (L) 03/23/2022 07:36 AM   HDL 21 (L) 10/25/2014 03:02 AM   CHOLHDL 3 03/23/2022 07:36 AM   LDLCALC 43 03/23/2022 07:36 AM   LDLCALC SEE COMMENT 10/25/2014 03:02 AM   LDLDIRECT 39.0 02/10/2021 07:40 AM    Wt Readings from Last 3 Encounters:  04/29/22 203 lb 6 oz (92.3 kg)  04/07/22 202 lb (91.6 kg)  03/02/22 202 lb (91.6 kg)     Exam:    BP (!) 172/72 (BP Location: Left Arm, Patient Position: Sitting, Cuff Size: Normal)   Pulse 63   Ht 5' 8"  (1.727 m)   Wt 203 lb 6 oz (92.3 kg)   SpO2 97%   BMI 30.92 kg/m  Constitutional:  oriented to person, place, and time. No distress.  HENT:  Head: Grossly normal Eyes:  no discharge. No scleral icterus.  Neck: No JVD, no carotid bruits  Cardiovascular: Regular rate and rhythm, no murmurs appreciated Pulmonary/Chest: Clear to auscultation bilaterally, no wheezes or rails Abdominal: Soft.  no distension.  no tenderness.  Musculoskeletal: Normal range of motion Neurological:  normal muscle tone. Coordination normal. No atrophy Skin: Skin warm and dry Psychiatric: normal affect, pleasant  ASSESSMENT & PLAN:    Problem List Items Addressed This Visit       Cardiology Problems  Essential hypertension   Relevant Orders   EKG 12-Lead   Type 2 diabetes mellitus with hyperlipidemia (HCC)   Carotid artery disease (HCC)   CAD (coronary artery disease), native coronary artery -  Primary   Relevant Orders   EKG 12-Lead   AAA (abdominal aortic aneurysm) without rupture (HCC)   Popliteal artery aneurysm (HCC)   Other Visit Diagnoses     PAD (peripheral artery disease) ([place])       Relevant Orders   EKG 12-Lead   Hyperlipidemia LDL goal <70       DM (diabetes mellitus), type 2 with peripheral vascular complications (HCC)         PAD Followed by vascular Carotid endarterectomy March 2020,  Also with lower extremity arterial disease Stressed importance of aggressive diabetes control Currently a non-smoker, cholesterol at goal  Diabetes type 2 with complications Hemoglobin A1c typically 8 or higher Recommend diet restriction, walking program for weight loss  CAD with stable angina Currently with no symptoms of angina. No further workup at this time. Continue current medication regimen.  Hyperlipidemia Cholesterol at goal, no medication changes made  Hypertension Blood pressure elevated, monitor at home and call with numbers  Acute renal failure Elevated following carotid endarterectomy March 2020,  GFR 54 A1C elevated   Total encounter time more than 30 minutes  Greater than 50% was spent in counseling and coordination of care with the patient    Signed, Ida Rogue, [place] Office [address], [place], [place] [address]

## 2022-04-29 ENCOUNTER — Ambulatory Visit: Payer: Medicare Other | Attending: Cardiovascular Disease | Admitting: Cardiovascular Disease

## 2022-04-29 ENCOUNTER — Encounter: Payer: Self-pay | Admitting: Cardiovascular Disease

## 2022-04-29 VITALS — BP 160/70 | HR 63 | Ht 68.0 in | Wt 203.4 lb

## 2022-04-29 DIAGNOSIS — I25118 Atherosclerotic heart disease of native coronary artery with other forms of angina pectoris: Secondary | ICD-10-CM | POA: Diagnosis not present

## 2022-04-29 DIAGNOSIS — I739 Peripheral vascular disease, unspecified: Secondary | ICD-10-CM | POA: Diagnosis not present

## 2022-04-29 DIAGNOSIS — E785 Hyperlipidemia, unspecified: Secondary | ICD-10-CM

## 2022-04-29 DIAGNOSIS — I714 Abdominal aortic aneurysm, without rupture, unspecified: Secondary | ICD-10-CM

## 2022-04-29 DIAGNOSIS — I6522 Occlusion and stenosis of left carotid artery: Secondary | ICD-10-CM

## 2022-04-29 DIAGNOSIS — I724 Aneurysm of artery of lower extremity: Secondary | ICD-10-CM

## 2022-04-29 DIAGNOSIS — I6523 Occlusion and stenosis of bilateral carotid arteries: Secondary | ICD-10-CM

## 2022-04-29 DIAGNOSIS — I10 Essential (primary) hypertension: Secondary | ICD-10-CM

## 2022-04-29 DIAGNOSIS — E1169 Type 2 diabetes mellitus with other specified complication: Secondary | ICD-10-CM

## 2022-04-29 DIAGNOSIS — E1151 Type 2 diabetes mellitus with diabetic peripheral angiopathy without gangrene: Secondary | ICD-10-CM

## 2022-04-29 MED ORDER — ATORVASTATIN CALCIUM 40 MG PO TABS: ORAL_TABLET | ORAL | 3 refills | Status: DC

## 2022-04-29 MED ORDER — FENOFIBRATE 145 MG PO TABS: ORAL_TABLET | ORAL | 3 refills | Status: DC

## 2022-04-29 MED ORDER — AMLODIPINE BESYLATE 5 MG PO TABS: 2.5000 mg | ORAL_TABLET | Freq: Every day | ORAL | 3 refills | Status: DC

## 2022-04-29 MED ORDER — LISINOPRIL 40 MG PO TABS: 40.0000 mg | ORAL_TABLET | Freq: Every day | ORAL | 3 refills | Status: DC

## 2022-04-29 MED ORDER — CLOPIDOGREL BISULFATE 75 MG PO TABS: 75.0000 mg | ORAL_TABLET | Freq: Every day | ORAL | 3 refills | Status: DC

## 2022-04-29 MED ORDER — METOPROLOL TARTRATE 25 MG PO TABS: 12.5000 mg | ORAL_TABLET | Freq: Two times a day (BID) | ORAL | 3 refills | Status: DC

## 2022-04-29 NOTE — Patient Instructions (Addendum)
Monitor blood pressure at home Call if elevated  Medication Instructions:  No changes  If you need a refill on your cardiac medications before your next appointment, please call your pharmacy.    Lab work: No new labs needed   Testing/Procedures: No new testing needed   Follow-Up: At [facility], you and your health needs are our priority.  As part of our continuing mission to provide you with exceptional heart care, we have created designated Provider Care Teams.  These Care Teams include your primary Cardiologist (physician) and Advanced Practice Providers (APPs -  Physician Assistants and Nurse Practitioners) who all work together to provide you with the care you need, when you need it.  You will need a follow up appointment in 12 months  Providers on your designated Care Team:   Murray Hodgkins, NP Christell Faith, PA-C Cadence Kathlen Mody, [place]  COVID-19 Vaccine Information can be found at: [email or web address] For questions related to vaccine distribution or appointments, please email [email or web address] or call [phone].

## 2022-05-06 DIAGNOSIS — H353213 Exudative age-related macular degeneration, right eye, with inactive scar: Secondary | ICD-10-CM | POA: Diagnosis not present

## 2022-05-06 DIAGNOSIS — H353221 Exudative age-related macular degeneration, left eye, with active choroidal neovascularization: Secondary | ICD-10-CM | POA: Diagnosis not present

## 2022-05-06 DIAGNOSIS — H43813 Vitreous degeneration, bilateral: Secondary | ICD-10-CM | POA: Diagnosis not present

## 2022-05-06 DIAGNOSIS — H43391 Other vitreous opacities, right eye: Secondary | ICD-10-CM | POA: Diagnosis not present

## 2022-05-09 ENCOUNTER — Encounter (INDEPENDENT_AMBULATORY_CARE_PROVIDER_SITE_OTHER): Payer: Self-pay

## 2022-05-19 ENCOUNTER — Telehealth: Payer: Self-pay | Admitting: Cardiovascular Disease

## 2022-05-19 NOTE — Telephone Encounter (Signed)
New Message:  Patient was told to record blood pressure readings and call to report them.  04-29-22--- 170/80  05-01-22--- 163/84   05-08-22---- 159/68     190/78 that morning    05-10-22-----177/83     05-11-22----- 159/80     05-13-22------155/78     05-17-22-------- 179/85      05-18-22---------181/80       05-19-22--------- 177/76

## 2022-05-20 NOTE — Telephone Encounter (Signed)
Antonieta Iba, MD  P Cv Div [place] Triage  Blood pressure elevated, would increase amlodipine up to 5 mg twice a day or 10 mg daily Could try lisinopril in the morning amlodipine in the p.m. I suspect blood pressure will continue to run moderately elevated, Would call us with more numbers in the next 2 weeks Thx TGollan

## 2022-05-20 NOTE — Telephone Encounter (Signed)
Attempted to contact pt. No answer, vm box not set up. Unable to leave message.

## 2022-05-23 NOTE — Telephone Encounter (Signed)
Attempted to contact pt again. No answer, vm box not set up. Unable to leave message.  Will send MyChart message.

## 2022-05-24 ENCOUNTER — Telehealth: Payer: Self-pay

## 2022-05-24 MED ORDER — AMLODIPINE BESYLATE 5 MG PO TABS: ORAL_TABLET | ORAL | 3 refills | Status: DC

## 2022-05-24 NOTE — Telephone Encounter (Signed)
I called and spoke with the patient regarding Dr. Windell Hummingbird recommendations. He asked that I speak with his wife as she manages his medications.   I spoke with Mrs. Eskridge and advised her of Dr. Windell Hummingbird recommendations as stated below, but did clarify with her that the patient is currently only taking amlodipine 2.5 mg once daily.  Per Mrs. Lacap, she thought the patient had been on the full amlodipine 5 mg tablet before and this had to be reduced due to low BP.  I have advised Mrs. Grondahl to have the patient: - take his regular dose of Lisinopril 40 mg once daily in the morning - increase amlodipine to 5 mg once daily at night - continue to monitor his BP over the next 2 weeks and call us with the readings at that time   Mrs. Whittenburg voices understanding of the above recommendations and is agreeable.

## 2022-05-24 NOTE — Progress Notes (Signed)
    Chronic Care Management Pharmacy Assistant   Name: [name]  MRN: [id number] DOB: 04-08-1946  Reason for Encounter: CCM (Appointment Reminder)  Medications: Outpatient Encounter Medications as of 05/24/2022  Medication Sig Note   acetaminophen (TYLENOL) 325 MG tablet Take 1-2 tablets (325-650 mg total) by mouth every 4 (four) hours as needed for mild pain (or temp >/= 101 F).    albuterol (PROAIR HFA) 108 (90 BASE) MCG/ACT inhaler Inhale 2 puffs into the lungs 3 (three) times daily as needed (cough).    amLODipine (NORVASC) 5 MG tablet Take 0.5 tablets (2.5 mg total) by mouth daily.    aspirin EC 81 MG tablet Take 81 mg by mouth daily. Swallow whole.    atorvastatin (LIPITOR) 40 MG tablet TAKE 1 TABLET(40 MG) BY MOUTH DAILY    blood glucose meter kit and supplies KIT 1 each by Does not apply route daily as needed. Dispense based on patient and insurance preference. Use up to four times daily as directed. (FOR ICD-9 250.00, 250.01).    budesonide-formoterol (SYMBICORT) 160-4.5 MCG/ACT inhaler Inhale 2 puffs into the lungs 2 (two) times daily. 03/01/2022: Via AZ[employer]ME PAP   clopidogrel (PLAVIX) 75 MG tablet Take 1 tablet (75 mg total) by mouth daily.    EPINEPHrine 0.3 mg/0.3 mL IJ SOAJ injection SMARTSIG:0.3 Milliliter(s) IM Once PRN    famotidine (PEPCID) 20 MG tablet Take 1 tablet (20 mg total) by mouth 2 (two) times daily.    fenofibrate (TRICOR) 145 MG tablet Take 1 tablet (145 mg) by mouth once daily    glucosamine-chondroitin 500-400 MG tablet Take 1 tablet by mouth 2 (two) times daily.    glucose blood (ONETOUCH ULTRA) test strip USE TO CHECK BLOOD SUGAR TWICE DAILY    insulin glargine (LANTUS SOLOSTAR) 100 UNIT/ML Solostar Pen ADMINISTER 90-100 UNITS UNDER THE SKIN DAILY 03/01/2022: Via [facility] PAP   Insulin Pen Needle (B-D UF III MINI PEN NEEDLES) 31G X 5 MM MISC USE AS INSTRUCTED TO INJECT INSULIN    Lancets (ONETOUCH ULTRASOFT) lancets Use as instructed to check sugar, dx  E11.9.    lisinopril (ZESTRIL) 40 MG tablet Take 1 tablet (40 mg total) by mouth daily.    meclizine (ANTIVERT) 25 MG tablet Take 25 mg by mouth every 6 (six) hours as needed. For dizziness/vertigo    metFORMIN (GLUCOPHAGE) 500 MG tablet TAKE 1 TABLET(500 MG) BY MOUTH TWICE DAILY WITH A MEAL    metoprolol tartrate (LOPRESSOR) 25 MG tablet Take 0.5 tablets (12.5 mg total) by mouth 2 (two) times daily.    Multiple Vitamins-Minerals (PRESERVISION AREDS 2 PO) Take 1 tablet by mouth 2 (two) times daily.    nitroGLYCERIN (NITROSTAT) 0.4 MG SL tablet Place 1 tablet (0.4 mg total) under the tongue every 5 (five) minutes as needed. For chest pain    No facility-administered encounter medications on file as of 05/24/2022.   Lutricia Horsfall was contacted to remind of upcoming telephone visit with Charlene Brooke on 05/27/2022 at 1:00. Patient was reminded to have any blood glucose and blood pressure readings available for review at appointment.   Message was left reminding patient of appointment.  CCM referral has been placed prior to visit?  No   Star Rating Drugs: Medication:  Last Fill: Day Supply Atorvastatin 40 mg 02/21/2022 90 Lisinopril 40 mg 04/26/2022 90 Metformin 500 mg 04/14/2022 [place], CPP notified  Marijean Niemann, [place] Clinical Pharmacy Assistant [phone]

## 2022-05-26 ENCOUNTER — Other Ambulatory Visit: Payer: Self-pay

## 2022-05-26 MED ORDER — AMLODIPINE BESYLATE 5 MG PO TABS: ORAL_TABLET | ORAL | 2 refills | Status: DC

## 2022-05-27 ENCOUNTER — Ambulatory Visit: Payer: Medicare Other | Admitting: Pharmacist

## 2022-05-27 DIAGNOSIS — I10 Essential (primary) hypertension: Secondary | ICD-10-CM

## 2022-05-27 DIAGNOSIS — E1169 Type 2 diabetes mellitus with other specified complication: Secondary | ICD-10-CM

## 2022-05-27 DIAGNOSIS — E782 Mixed hyperlipidemia: Secondary | ICD-10-CM

## 2022-05-27 DIAGNOSIS — J432 Centrilobular emphysema: Secondary | ICD-10-CM

## 2022-05-27 NOTE — Progress Notes (Signed)
Chronic Care Management Pharmacy Note  06/06/2022 Name:  [name] MRN:  [id number] DOB:  02-09-1946  Summary: CCM F/U visit -HTN: BP elevated lately 140s-150s, recently increased amlodipine to 5 mg per cardiology recommendations -DM: fructosamine correlates with A1c 5.8%; pt gets Lantus through PAP  Recommendations/Changes made from today's visit: -Advised pt to monitor BP and contact cardiology in 2 weeks -Renew Lantus PAP for 2024  Plan: -[place] will call patient 1 month for PAP update -Pharmacist follow up televisit scheduled for 6 months -PCP appt 10/06/22    Subjective: [name] is an 76 y.o. year old male who is a primary patient of Damita Dunnings, Elveria Rising, MD.  The CCM team was consulted for assistance with disease management and care coordination needs.    Engaged with patient by telephone for follow up visit in response to provider referral for pharmacy case management and/or care coordination services.   Consent to Services:  The patient was given information about Chronic Care Management services, agreed to services, and gave verbal consent prior to initiation of services.  Please see initial visit note for detailed documentation.   Patient Care Team: Tonia Ghent, MD as PCP - General Rockey Situ, Kathlene November, MD as PCP - Cardiology (Cardiology) Minna Merritts, MD as Consulting Physician (Cardiology) Charlton Haws, [facility] as Pharmacist (Pharmacist)  Recent office visits: 04/07/22 Dr Damita Dunnings OV: annual - fructosamine 252 ~A1c 5.8%  03/02/22 Dr Lorelei Pont OV: bursitis of hip - triamcinolone injection.  09/08/21 Elsie Stain, MD Change from Advair to Symbicort (AZ &B ME PAP); Stop: Brilinta. Start Plavix 75 mg.   09/02/21 Elsie Stain, MD [facility]BPR Referral to [facility]. Change: Amlodipine 2.5 mg vs. 5 mg.  Recent consult visits: 05/19/22 Cardiology TE: BP 150-181/80s at home. Increase amlodipine to 5 mg BID or 10 mg daily. Try lisinopril in AM and  amlodipine in PM. 04/29/22 Dr Rockey Situ OV: f/u - BP elevated, monitor at home and call with readings.  09/02/21 Hortencia Pilar, MD (Vascular Surgery): Carotid artery stenosis. Procedure: Carotid Duplex  Hospital visits: None in previous 6 months   Objective:  Lab Results  Component Value Date   CREATININE 1.28 03/23/2022   BUN 22 03/23/2022   GFR 54.20 (L) 03/23/2022   GFRNONAA >60 05/05/2021   GFRAA 56 (L) 11/22/2019   NA 142 03/23/2022   K 3.8 03/23/2022   CALCIUM 9.6 03/23/2022   CO2 30 03/23/2022   GLUCOSE 89 03/23/2022    Lab Results  Component Value Date/Time   HGBA1C 8.9 (H) 03/23/2022 07:36 AM   HGBA1C 10.0 (H) 09/02/2021 09:49 AM   HGBA1C 7.1 (H) 10/25/2014 03:02 AM   FRUCTOSAMINE 252 03/23/2022 07:36 AM   FRUCTOSAMINE 267 09/02/2021 09:49 AM   GFR 54.20 (L) 03/23/2022 07:36 AM   GFR 59.39 (L) 09/02/2021 09:49 AM    Last diabetic Eye exam: No results found for: "HMDIABEYEEXA"  Last diabetic Foot exam: No results found for: "HMDIABFOOTEX"   Lab Results  Component Value Date   CHOL 105 03/23/2022   HDL 31.40 (L) 03/23/2022   LDLCALC 43 03/23/2022   LDLDIRECT 39.0 02/10/2021   TRIG 152.0 (H) 03/23/2022   CHOLHDL 3 03/23/2022       Latest Ref Rng & Units 03/23/2022    7:36 AM 05/05/2021    1:51 PM 02/10/2021    7:40 AM  Hepatic Function  Total Protein 6.0 - 8.3 g/dL 6.5  7.1  6.5   Albumin 3.5 - 5.2 g/dL 3.9  4.2  4.0   AST 0 - 37 U/L _0 ALT 0 - 53 U/L _1 Alk Phosphatase 39 - 117 U/L 35  45  42   Total Bilirubin 0.2 - 1.2 mg/dL 0.5  0.8  0.6     Lab Results  Component Value Date/Time   TSH 2.390 05/11/2009 09:25 PM       Latest Ref Rng & Units 03/23/2022    7:36 AM 05/06/2021    7:50 AM 05/05/2021   10:29 PM  CBC  WBC 4.0 - 10.5 K/uL 7.2     Hemoglobin 13.0 - 17.0 g/dL 15.4  14.3  14.0   Hematocrit 39.0 - 52.0 % 45.3   41.2   Platelets 150.0 - 400.0 K/uL 163.0       No results found for: "VD25OH"  Clinical ASCVD: Yes   The ASCVD Risk score (Arnett DK, et al., 2019) failed to calculate for the following reasons:   The patient has a prior MI or stroke diagnosis       02/17/2022    1:03 PM 02/09/2021    8:21 AM 05/14/2020    9:16 AM  Depression screen PHQ 2/9  Decreased Interest 0 0 0  Down, Depressed, Hopeless 0 0 0  PHQ - 2 Score 0 0 0  Altered sleeping 0 0   Tired, decreased energy 0 0   Change in appetite 0 0   Feeling bad or failure about yourself  0 0   Trouble concentrating 0 0   Moving slowly or fidgety/restless 0 0   Suicidal thoughts 0 0   PHQ-9 Score 0 0   Difficult doing work/chores Not difficult at all Not difficult at all      Social History   Tobacco Use  Smoking Status Former   Packs/day: 1.00   Years: 40.00   Total pack years: 40.00   Types: Cigarettes   Quit date: 10/21/1998   Years since quitting: 23.6  Smokeless Tobacco Never  Tobacco Comments   Past smoker, states he will smoke maybe 1 cigarette/ month or so still   BP Readings from Last 3 Encounters:  04/29/22 (!) 160/70  04/07/22 120/66  03/02/22 (!) 158/78   Pulse Readings from Last 3 Encounters:  04/29/22 63  04/07/22 66  03/02/22 73   Wt Readings from Last 3 Encounters:  04/29/22 203 lb 6 oz (92.3 kg)  04/07/22 202 lb (91.6 kg)  03/02/22 202 lb (91.6 kg)   BMI Readings from Last 3 Encounters:  04/29/22 30.92 kg/m  04/07/22 30.71 kg/m  03/02/22 30.71 kg/m    Assessment/Interventions: Review of patient past medical history, allergies, medications, health status, including review of consultants reports, laboratory and other test data, was performed as part of comprehensive evaluation and provision of chronic care management services.   SDOH:  (Social Determinants of Health) assessments and interventions performed: No SDOH Interventions    Flowsheet Row Clinical Support from 02/17/2022 in [place] at [place] Management from 08/19/2021 in [place] at [place] from 02/09/2021 in [place] at [place] Management from 01/25/2021 in [place] at [place] Management from 07/27/2020 in [place] at [place] Interventions Intervention Not Indicated -- -- -- --  Housing Interventions Intervention Not Indicated -- -- -- --  Transportation Interventions Intervention Not Indicated -- -- -- --  Depression Interventions/Treatment  -- -- HEN2-7 Score <4 Follow-up Not Indicated -- --  Financial Strain Interventions Intervention Not Indicated Other (Comment)  [Lantus PAP] -- Intervention Not Indicated Intervention Not Indicated  Physical Activity Interventions Intervention Not Indicated -- -- -- --  Stress Interventions Intervention Not Indicated -- -- -- --  Social Connections Interventions Intervention Not Indicated -- -- -- --      SDOH Screenings   Food Insecurity: No Food Insecurity (02/17/2022)  Housing: Low Risk  (02/17/2022)  Transportation Needs: No Transportation Needs (02/17/2022)  Alcohol Screen: Low Risk  (02/17/2022)  Depression (PHQ2-9): Low Risk  (02/17/2022)  Financial Resource Strain: Low Risk  (02/17/2022)  Physical Activity: Insufficiently Active (02/17/2022)  Social Connections: Moderately Integrated (02/17/2022)  Stress: No Stress Concern Present (02/17/2022)  Tobacco Use: Medium Risk (04/29/2022)    CCM Care Plan  Allergies  Allergen Reactions   Augmentin [Amoxicillin-Pot Clavulanate] Other (See Comments)    Nausea,vomiting,diarrhea   Morphine And Related Nausea And Vomiting    Vomiting, GI upset   Metformin And Related Other (See Comments)    Intolerant of 2019m a day.     Medications Reviewed Today     Reviewed by [facility]Charlton Haws [facility](Pharmacist) on 05/27/22 at 1336  Med List Status: <None>   Medication Order Taking? Sig Documenting Provider Last Dose Status Informant  acetaminophen (TYLENOL) 325 MG  tablet [id number] Take 1-2 tablets (325-650 mg total) by mouth every 4 (four) hours as needed for mild pain (or temp >/= 101 F). CJuanna Cao MD Taking Active Spouse/Significant Other  albuterol ([facility]idge[facility] InstituteHFA) 108 (90 BASE) MCG/ACT inhaler [id number] Inhale 2 puffs into the lungs 3 (three) times daily as needed (cough). DTonia Ghent MD Taking Active Spouse/Significant Other  amLODipine (NORVASC) 5 MG tablet [id number] Take 1 tablet (5 mg) by mouth once daily at night GMinna Merritts MD Taking Active   aspirin EC 81 MG tablet [id number] Take 81 mg by mouth daily. Swallow whole. [provider] Taking Active   atorvastatin (LIPITOR) 40 MG tablet [id number] TAKE 1 TABLET(40 MG) BY MOUTH DAILY Gollan, TKathlene November MD Taking Active   blood glucose meter kit and supplies KIT [id number] 1 each by Does not apply route daily as needed. Dispense based on patient and insurance preference. Use up to four times daily as directed. (FOR ICD-9 250.00, 250.01). DTonia Ghent MD Taking Active Spouse/Significant Other  budesonide-formoterol ([facility] 160-4.5 MCG/ACT inhaler [id number] Inhale 2 puffs into the lungs 2 (two) times daily. DTonia Ghent MD Taking Active            Med Note (Charlton Haws  Tue Mar 01, 2022  9:04 AM) Via AZ&ME PAP  clopidogrel (PLAVIX) 75 MG tablet [id number] Take 1 tablet (75 mg total) by mouth daily. GMinna Merritts MD Taking Active   EPINEPHrine 0.3 mg/0.3 mL IJ SOAJ injection [id number] SMARTSIG:0.3 Milliliter(s) IM Once PRN [provider] Taking Active Spouse/Significant Other  famotidine (PEPCID) 20 MG tablet [id number] Take 1 tablet (20 mg total) by mouth 2 (two) times daily. SPaulette Blanch MD Taking Active Spouse/Significant Other  fenofibrate (TRICOR) 145 MG tablet [id number] Take 1 tablet (145 mg) by mouth once daily GMinna Merritts MD Taking Active   glucosamine-chondroitin 500-400 MG tablet [id number] Yes Take 1 tablet by mouth 2 (two) times daily. [provider] Taking Active Spouse/Significant Other  glucose blood (ONETOUCH ULTRA) test strip  [id number] Yes USE TO CHECK BLOOD SUGAR TWICE DAILY Tonia Ghent, MD Taking Active   insulin glargine (LANTUS SOLOSTAR) 100 UNIT/ML Solostar Pen [id number] Yes ADMINISTER 90-100 UNITS UNDER THE SKIN DAILY Tonia Ghent, MD Taking Active Spouse/Significant Other           Med Note Charlton Haws   Tue Mar 01, 2022  9:05 AM) Via SANOFI PAP  Insulin Pen Needle (B-D UF III MINI PEN NEEDLES) 31G X 5 MM MISC [id number] Yes USE AS INSTRUCTED TO INJECT INSULIN Tonia Ghent, MD Taking Active   Lancets [facility] ULTRASOFT) lancets [id number] Yes Use as instructed to check sugar, dx E11.9. Tonia Ghent, MD Taking Active Spouse/Significant Other  lisinopril (ZESTRIL) 40 MG tablet [id number] Yes Take 1 tablet (40 mg total) by mouth daily. Minna Merritts, MD Taking Active   meclizine (ANTIVERT) 25 MG tablet [id number] Yes Take 25 mg by mouth every 6 (six) hours as needed. For dizziness/vertigo [provider] Taking Active Spouse/Significant Other  metFORMIN (GLUCOPHAGE) 500 MG tablet [id number] Yes TAKE 1 TABLET(500 MG) BY MOUTH TWICE DAILY WITH A MEAL Tonia Ghent, MD Taking Active   metoprolol tartrate (LOPRESSOR) 25 MG tablet [id number] Yes Take 0.5 tablets (12.5 mg total) by mouth 2 (two) times daily. Minna Merritts, MD Taking Active   Multiple Vitamins-Minerals (PRESERVISION AREDS 2 PO) [id number] Yes Take 1 tablet by mouth 2 (two) times daily. [provider] Taking Active Spouse/Significant Other  nitroGLYCERIN (NITROSTAT) 0.4 MG SL tablet [id number] Yes Place 1 tablet (0.4 mg total) under the tongue every 5 (five) minutes as needed. For chest pain Minna Merritts, MD Taking Active Spouse/Significant Other            Patient Active Problem List   Diagnosis Date Noted   Lower GI bleed    Tobacco abuse     Rectal bleeding 05/05/2021   Nicotine dependence 05/05/2021   Allergic reaction 03/24/2021   Health care maintenance 02/17/2021   Aortic ectasia, abdominal ([place]) 07/17/2019   Lower abdominal pain 06/27/2019   Carotid artery stenosis without cerebral infarction, left 09/21/2018   Abnormal dreams 08/05/2018   Creatinine elevation 08/05/2018   Dysuria 01/14/2018   CAD (coronary artery disease), native coronary artery 07/16/2017   Shoulder pain 12/30/2016   Skin lesion 12/30/2016   Unstable angina ([place]) 09/03/2016   AAA (abdominal aortic aneurysm) without rupture ([place]) 07/13/2016   Popliteal artery aneurysm ([place]) 07/13/2016   Carotid artery disease ([place]) 01/30/2015   NSTEMI (non-ST elevated myocardial infarction) ([place]) 11/16/2014   Medicare annual wellness visit, initial 10/21/2014   Advance care planning 10/21/2014   Atherosclerosis of native arteries of extremity with intermittent claudication ([place]) 09/09/2014   COPD exacerbation ([place]) 08/21/2014   Type 2 diabetes mellitus with hyperlipidemia ([place]) 05/24/2013   Back pain 02/24/2013   Cough 01/26/2011   COPD (chronic obstructive pulmonary disease) ([place]) 12/26/2010   FATIGUE 05/11/2009   Essential hypertension 05/08/2009   Hyperlipidemia 06/20/2008    Immunization History  Administered Date(s) Administered   Fluad Quad(high Dose 65+) 05/14/2020, 05/27/2021, 04/07/2022   Influenza,inj,Quad PF,6+ Mos 07/16/2013, 04/21/2014, 06/02/2015, 06/10/2016, 03/31/2017, 08/02/2018   PFIZER(Purple Top)SARS-COV-2 Vaccination 08/17/2019, 09/07/2019, 04/25/2020   Pneumococcal Conjugate-13 10/20/2014   Pneumococcal Polysaccharide-23 12/29/2011   Td 01/19/2010    Conditions to be addressed/monitored:  Hypertension, Hyperlipidemia, Diabetes, Coronary Artery Disease, and COPD  Care Plan : [place]  Updates made by Charlton Haws, [place] since 06/06/2022 12:00 AM  Problem: Hypertension, Hyperlipidemia, Diabetes, Coronary  Artery Disease, and COPD   Priority: High     Long-Range Goal: Disease mgmt   Start Date: 03/04/2022  Expected End Date: 03/05/2023  Recent Progress: On track  Priority: High  Note:   Current Barriers:  Elevated BP  Pharmacist Clinical Goal(s):  Patient will contact provider office for questions/concerns as evidenced notation of same in electronic health record through collaboration with PharmD and provider.   Interventions: 1:1 collaboration with Tonia Ghent, MD regarding development and update of comprehensive plan of care as evidenced by provider attestation and co-signature Inter-disciplinary care team collaboration (see longitudinal plan of care) Comprehensive medication review performed; medication list updated in electronic medical record  Hypertension (BP goal <130/80) -Not ideally controlled- BP recently elevated at home, cardiology increased amlodipine to 5 mg and pt is compliant with this for the past few days -Current home readings: none available -Denies hypotensive/hypertensive symptoms -Current treatment: Lisinopril 40 mg daily - Appropriate, Query Effective Metoprolol tartrate 25 mg - 1/2 tablet twice daily -Appropriate, Query Effective Amlodipine 5 mg daily -Appropriate, Query Effective -Medications previously tried: HCTZ  -Educated on BP goals and benefits of medications for prevention of heart attack, stroke and kidney damage; -Counseled to monitor BP at home daily - call cardiology in 2 weeks with update -Recommended to continue current medication  Diabetes (Fructosamine < 317 or A1C < 7%) -Controlled -  A1c 8.9% is inaccurate, using fructosamine to monitor DM; last fructosamine 252 corresponds to A1c 5.8% (03/2022) -Current home glucose readings:  fasting: 100-120 -Denies hypoglycemic/hyperglycemic symptoms -Current medications: Lantus Solostar (PAP) 90 units daily - Appropriate, Effective, Safe, Accessible Metformin 500 mg BID - Appropriate, Effective,  Safe, Accessible -Medications previously tried: glipizide, max dose metformin 1000 mg/day  -Recommended to continue current medication; renew Lantus PAP for 2024  Hyperlipidemia: (LDL goal < 70) -Controlled - LDL 43 (03/2022) at goal -Hx CAD, PAD, stent -Current treatment: Atorvastatin 40 mg daily - Appropriate, Effective, Safe, Accessible Fenofibrate 145 mg daily -Appropriate, Effective, Safe, Accessible Clopidogrel 75 mg daily -Appropriate, Effective, Safe, Accessible Aspirin 81 mg daily -Appropriate, Effective, Safe, Accessible Nitroglycerin 0.4 mg SL prn -Appropriate, Effective, Safe, Accessible -Medications previously tried: Brilinta  -Educated on Cholesterol goals; Benefits of statin for ASCVD risk reduction; -Recommended to continue current medication  COPD (Goal: control symptoms and prevent exacerbations) -Controlled - wife says he wheezes occasionally, pt does not think so -Former smoker (quit 2000) -Gold Grade: unknown -Current COPD Classification:  A (low sx, <2 exacerbations/yr) -Pulmonary function testing: not on file -Exacerbations requiring treatment in last 6 months: 0 -Current treatment  Symbicort 160-4.5 mcg/act 2 puff BID (PAP) -Appropriate, Effective, Safe, Accessible Albuterol HFA prn -Appropriate, Effective, Safe, Accessible -Medications previously tried: n/a  -Patient reports consistent use of maintenance inhaler -Frequency of rescue inhaler use: rare -Counseled on Proper inhaler technique; Benefits of consistent maintenance inhaler use -Recommended to continue current medication  Patient Goals/Self-Care Activities Patient will:  - take medications as prescribed as evidenced by patient report and record review focus on medication adherence by routine check glucose daily, document, and provide at future appointments check blood pressure daily, document, and provide at future appointments     Medication Assistance:  Lantus (Sanofi) - approved  2023 Symbicort (AZ&Me) - approved 2023  Compliance/Adherence/Medication fill history: Care Gaps: Eye exam (due 08/11/21)  Star-Rating Drugs: Atorvastatin - PDC 91% Metformin - PDC 98% Lisinopril - PDC 94%  Medication Access: Within the past 30 days, how often has patient  missed a dose of medication? 0 Is a pillbox or other method used to improve adherence? Yes  Factors that may affect medication adherence? no barriers identified Are meds synced by current pharmacy? No  Are meds delivered by current pharmacy? No  Does patient experience delays in picking up medications due to transportation concerns? No   [facility] Services Reviewed: Is patient disadvantaged to use [facility]?: Yes  Current Rx insurance plan: [place] Name and location of Current pharmacy:  [facility] [place], [place] [address] [place] [address] Phone: [phone] Fax: [place], [place] [address] [place] [address] Phone: [phone] Fax: [phone]  [facility] services reviewed with patient today?: No  Patient requests to transfer care to [facility] Pharmacy?: No  Reason patient declined to change pharmacies: Disadvantaged due to insurance/mail order   Care Plan and Follow Up Patient Decision:  Patient agrees to Care Plan and Follow-up.  Plan: Telephone follow up appointment with care management team member scheduled for:  3 months  Charlene Brooke, PharmD, BCACP Clinical Pharmacist [place] [facility] at [facility] [phone]

## 2022-06-06 ENCOUNTER — Telehealth: Payer: Self-pay

## 2022-06-06 ENCOUNTER — Telehealth: Payer: Self-pay | Admitting: Cardiovascular Disease

## 2022-06-06 NOTE — Progress Notes (Addendum)
    Chronic Care Management Pharmacy Assistant   Name: TELVIN REINDERS  MRN: [id number] DOB: 06-Jul-1946   Reason for Encounter: CCM (Lantus Solostar PAP 2024)   Patient assistance forms for Lantus Solostar with Sanofi for 2024 have been completed and uploaded. Medication will be delivered to the office.   Al Corpus, CPP notified  Claudina Lick, [place] Clinical Pharmacy Assistant [phone]

## 2022-06-06 NOTE — Telephone Encounter (Signed)
Called the patient to inform him that a 1 year RX was sent in 04/2022 to [facility]. I also called the pharmacy and they stated that he has 3 refills remaining.

## 2022-06-06 NOTE — Telephone Encounter (Signed)
Forms printed and placed in front office for patient signature. 

## 2022-06-06 NOTE — Patient Instructions (Signed)
Visit Information  Phone number for Pharmacist: [phone]   Goals Addressed   None     Care Plan : CCM Pharmacy Care Plan  Updates made by Kathyrn Sheriff, RPH since 06/06/2022 12:00 AM     Problem: Hypertension, Hyperlipidemia, Diabetes, Coronary Artery Disease, and COPD   Priority: High     Long-Range Goal: Disease mgmt   Start Date: 03/04/2022  Expected End Date: 03/05/2023  Recent Progress: On track  Priority: High  Note:   Current Barriers:  Elevated BP  Pharmacist Clinical Goal(s):  Patient will contact provider office for questions/concerns as evidenced notation of same in electronic health record through collaboration with PharmD and provider.   Interventions: 1:1 collaboration with Joaquim Nam, MD regarding development and update of comprehensive plan of care as evidenced by provider attestation and co-signature Inter-disciplinary care team collaboration (see longitudinal plan of care) Comprehensive medication review performed; medication list updated in electronic medical record  Hypertension (BP goal <130/80) -Not ideally controlled- BP recently elevated at home, cardiology increased amlodipine to 5 mg and pt is compliant with this for the past few days -Current home readings: none available -Denies hypotensive/hypertensive symptoms -Current treatment: Lisinopril 40 mg daily - Appropriate, Query Effective Metoprolol tartrate 25 mg - 1/2 tablet twice daily -Appropriate, Query Effective Amlodipine 5 mg daily -Appropriate, Query Effective -Medications previously tried: HCTZ  -Educated on BP goals and benefits of medications for prevention of heart attack, stroke and kidney damage; -Counseled to monitor BP at home daily - call cardiology in 2 weeks with update -Recommended to continue current medication  Diabetes (Fructosamine < 317 or A1C < 7%) -Controlled -  A1c 8.9% is inaccurate, using fructosamine to monitor DM; last fructosamine 252 corresponds  to A1c 5.8% (03/2022) -Current home glucose readings:  fasting: 100-120 -Denies hypoglycemic/hyperglycemic symptoms -Current medications: Lantus Solostar (PAP) 90 units daily - Appropriate, Effective, Safe, Accessible Metformin 500 mg BID - Appropriate, Effective, Safe, Accessible -Medications previously tried: glipizide, max dose metformin 1000 mg/day  -Recommended to continue current medication; renew Lantus PAP for 2024  Hyperlipidemia: (LDL goal < 70) -Controlled - LDL 43 (03/2022) at goal -Hx CAD, PAD, stent -Current treatment: Atorvastatin 40 mg daily - Appropriate, Effective, Safe, Accessible Fenofibrate 145 mg daily -Appropriate, Effective, Safe, Accessible Clopidogrel 75 mg daily -Appropriate, Effective, Safe, Accessible Aspirin 81 mg daily -Appropriate, Effective, Safe, Accessible Nitroglycerin 0.4 mg SL prn -Appropriate, Effective, Safe, Accessible -Medications previously tried: Brilinta  -Educated on Cholesterol goals; Benefits of statin for ASCVD risk reduction; -Recommended to continue current medication  COPD (Goal: control symptoms and prevent exacerbations) -Controlled - wife says he wheezes occasionally, pt does not think so -Former smoker (quit 2000) -Gold Grade: unknown -Current COPD Classification:  A (low sx, <2 exacerbations/yr) -Pulmonary function testing: not on file -Exacerbations requiring treatment in last 6 months: 0 -Current treatment  Symbicort 160-4.5 mcg/act 2 puff BID (PAP) -Appropriate, Effective, Safe, Accessible Albuterol HFA prn -Appropriate, Effective, Safe, Accessible -Medications previously tried: n/a  -Patient reports consistent use of maintenance inhaler -Frequency of rescue inhaler use: rare -Counseled on Proper inhaler technique; Benefits of consistent maintenance inhaler use -Recommended to continue current medication  Patient Goals/Self-Care Activities Patient will:  - take medications as prescribed as evidenced by patient report  and record review focus on medication adherence by routine check glucose daily, document, and provide at future appointments check blood pressure daily, document, and provide at future appointments      Patient verbalizes understanding of instructions and care plan  provided today and agrees to view in [place]. Active MyChart status and patient understanding of how to access instructions and care plan via MyChart confirmed with patient.    Telephone follow up appointment with pharmacy team member scheduled for: 6 months  Charlene Brooke, PharmD, [facility] Clinical Pharmacist [place] [facility] at [facility] [phone]

## 2022-06-06 NOTE — Telephone Encounter (Signed)
*  STAT* If patient is at the pharmacy, call can be transferred to refill team.   1. Which medications need to be refilled? (please list name of each medication and dose if known) Fenofibrate  2. Which pharmacy/location (including street and city if local pharmacy) is medication to be sent to?[facility] [place],  3. Do they need a 30 day or 90 day supply? 90 days and refills

## 2022-06-07 NOTE — Telephone Encounter (Signed)
Called patient; no answer; left message.  Lindsey Foltanski, CPP notified  Isley Zinni, RMA Clinical Pharmacy Assistant [phone]   

## 2022-06-21 ENCOUNTER — Other Ambulatory Visit: Payer: Self-pay | Admitting: Family Medicine

## 2022-06-21 DIAGNOSIS — H43813 Vitreous degeneration, bilateral: Secondary | ICD-10-CM | POA: Diagnosis not present

## 2022-06-21 DIAGNOSIS — H348312 Tributary (branch) retinal vein occlusion, right eye, stable: Secondary | ICD-10-CM | POA: Diagnosis not present

## 2022-06-21 DIAGNOSIS — H353221 Exudative age-related macular degeneration, left eye, with active choroidal neovascularization: Secondary | ICD-10-CM | POA: Diagnosis not present

## 2022-06-21 DIAGNOSIS — H353213 Exudative age-related macular degeneration, right eye, with inactive scar: Secondary | ICD-10-CM | POA: Diagnosis not present

## 2022-06-27 ENCOUNTER — Telehealth: Payer: Self-pay | Admitting: Family Medicine

## 2022-06-27 ENCOUNTER — Encounter: Payer: Self-pay | Admitting: Family Medicine

## 2022-06-27 ENCOUNTER — Telehealth (INDEPENDENT_AMBULATORY_CARE_PROVIDER_SITE_OTHER): Payer: Medicare Other | Admitting: Family Medicine

## 2022-06-27 VITALS — BP 144/75 | Temp 98.6°F | Ht 68.0 in | Wt 200.0 lb

## 2022-06-27 DIAGNOSIS — U071 COVID-19: Secondary | ICD-10-CM | POA: Diagnosis not present

## 2022-06-27 MED ORDER — MOLNUPIRAVIR EUA 200MG CAPSULE: 4.0000 | ORAL_CAPSULE | Freq: Two times a day (BID) | ORAL | 0 refills | Status: AC

## 2022-06-27 NOTE — Telephone Encounter (Signed)
Patient is scheduled for a video visit today at 4:30 pm with Dr. Para March

## 2022-06-27 NOTE — Progress Notes (Unsigned)
Virtual visit completed through WebEx or similar program Patient location: home  Provider location: [place] at [facility], office  Participants: Patient and me (unless stated otherwise below)  Limitations and rationale for visit method d/w patient.  Patient agreed to proceed.   CC: covid.   IHW:TUUEKCM has been having cough x 3 days, yesterday nausea, diarrhea, HA, woozy, and confusion; last night had a fever of 100.4. Has been taking tylenol. Tested positive for covid Saturday night. No confusion today.  No fever today.  He can still get a deep breath.  Still using his inhalers.    Meds and allergies reviewed.   ROS: Per HPI unless specifically indicated in ROS section   NAD Speech wnl  A/P: Covid.  Normal speech.  Not disoriented or confused.  Okay for outpatient follow-up.  Discussed options.  Supportive care.  Routine masking and isolation discussed with patient.  Start Molnupiravir with rationale for use discussed with patient.  He agrees with plan.  He will update [place] as needed.

## 2022-06-27 NOTE — Telephone Encounter (Signed)
I spoke with pts wife (DPR signed) pt already has VV appt with Dr Para March 06/27/22 at 4:30. Pts wife said not seen confusion today and pt does not have CP, SOB or wheezing. Mrs Stary said pt is not in distress and will keep VV appt today unless condition changes or worsens. UC & ED precautions given and pts wife voiced understanding. Sending note to Dr Para March and Para March pool.

## 2022-06-27 NOTE — Telephone Encounter (Signed)
Patient wife called tested positive for covid Sat, and Sunday and she wanted to know if there is anything can be prescribed. Call back number [phone]

## 2022-06-27 NOTE — Telephone Encounter (Signed)
We do not prescribe medications without being seen. Patient will need an appt with our office or somewhere that can do a VV with him.; please call patient to set that up.

## 2022-06-27 NOTE — Telephone Encounter (Signed)
[facility] Night - Client TELEPHONE ADVICE RECORD [facility] Patient Name: [name] Gender: Male DOB: January 25, 1946 Age: 76 Y 2 M 10 D Return Phone Number: [phone] (Primary), [phone] (Secondary) Address: [place] [address] Client [facility] Night - Client Client Site [facility] - Night Provider [name] - MD Contact Type Call Who Is Calling Patient / Member / Family / Caregiver Call Type Triage / Clinical Caller Name Major Santerre Relationship To Patient Spouse Return Phone Number Please choose phone number Chief Complaint CONFUSION - new onset Reason for Call Symptomatic / Request for Health Information Initial Comment Caller says that her husband tested positive for COVID. He has had a cough, he is nauseous, and he is a bit confused. New onset. His sugar is 145. BP is 144/75 Translation No Nurse Assessment Nurse: Judith Part, RN, Santina Evans Date/Time (Eastern Time): 06/26/2022 4:55:23 PM Confirm and document reason for call. If symptomatic, describe symptoms. ---caller states he has covid tested positive last night and this morning. symptoms started couple days ago with a little cough and runny nose. got worse yesterday afternoon. temp 100.4 via forehead. blood sugar is 145. little disoriented little dizzy and nausea. sometimes thinks someone is there when they werent. real lethargic. he knows who people are Does the patient have any new or worsening symptoms? ---Yes Will a triage be completed? ---Yes Related visit to physician within the last 2 weeks? ---No Does the PT have any chronic conditions? (i.e. diabetes, asthma, this includes High risk factors for pregnancy, etc.) ---Yes List chronic conditions. ---diabetic, 5 heart attacks has 5 stents, macula degeneration, Is this a behavioral health or substance abuse call? ---No Guidelines Guideline Title  Affirmed Question Affirmed Notes Nurse Date/Time (Eastern Time) COVID-19 - Diagnosed or Suspected [1] HIGH RISK patient (e.g., weak immune system, age [name] 06/26/2022 4:59:39 PM PLEASE NOTE: All timestamps contained within this report are represented as [place] Standard Time. CONFIDENTIALTY NOTICE: This fax transmission is intended only for the addressee. It contains information that is legally privileged, confidential or otherwise protected from use or disclosure. If you are not the intended recipient, you are strictly prohibited from reviewing, disclosing, copying using or disseminating any of this information or taking any action in reliance on or regarding this information. If you have received this fax in error, please notify us immediately by telephone so that we can arrange for its return to [place]. Phone: [phone], Toll-Free: [phone], Fax: [phone] Page: 2 of 3 Call Id: [id number] Guidelines Guideline Title Affirmed Question Affirmed Notes Nurse Date/Time [name] Time) > 64 years, obesity with BMI 30 or higher, pregnant, chronic lung disease or other chronic medical condition) AND [2] COVID symptoms (e.g., cough, fever) (Exceptions: Already seen by PCP and no new or worsening symptoms.) Disp. Time [name] Time) Disposition Final User 06/26/2022 4:54:10 PM Send to Urgent Ladoris Gene 06/26/2022 5:06:32 PM Call PCP within 24 Hours Yes Judith Part, RN, Santina Evans Final Disposition 06/26/2022 5:06:32 PM Call PCP within 24 Hours Yes Judith Part, RN, [name] Disagree/Comply Comply Caller Understands Yes PreDisposition InappropriateToAsk Care Advice Given Per Guideline CALL PCP WITHIN 24 HOURS: * You need to discuss this with your doctor (or NP/PA) within the next 24 hours. * IF OFFICE WILL BE OPEN: Call the office when it opens tomorrow morning. GENERAL CARE ADVICE FOR COVID-19 SYMPTOMS: * The symptoms are generally treated the same  whether you have COVID-19, influenza or some other respiratory virus. *  Feeling dehydrated: Drink extra liquids. If the air in your home is dry, use a humidifier. * Cough: Use cough drops. * Fever, Chills, and Sweats: For fever over 101 F (38.3 C), take acetaminophen every 4 to 6 hours (Adults 650 mg) OR ibuprofen every 6 to 8 hours (Adults 400 mg). Before taking any medicine, read all the instructions on the package. Do not take aspirin unless your doctor has prescribed it for you. Chills can sometimes come before a fever. You may feel cold in your hands and feet. You may have shivering. You may also feel sweaty as your body temperature goes down. CARE ADVICE given per COVID-19 - DIAGNOSED OR SUSPECTED (Adult) guideline. CALL BACK IF: * You become worse Comments User: [name], RN Date/Time [name] Time): 06/26/2022 5:03:11 PM pulse ox is 95 User: [name], RN Date/Time [name] Time): 06/26/2022 5:05:55 PM PLEASE NOTE: All timestamps contained within this report are represented as [place] Standard Time. CONFIDENTIALTY NOTICE: This fax transmission is intended only for the addressee. It contains information that is legally privileged, confidential or otherwise protected from use or disclosure. If you are not the intended recipient, you are strictly prohibited from reviewing, disclosing, copying using or disseminating any of this information or taking any action in reliance on or regarding this information. If you have received this fax in error, please notify us immediately by telephone so that we can arrange for its return to [place]. Phone: [phone], Toll-Free: [phone], Fax: [phone] Page: 3 of 3 Call Id: [id number] Comments has copd Referrals REFERRED TO PCP OFFIC

## 2022-06-28 NOTE — Telephone Encounter (Signed)
See OV note.  Thanks.  

## 2022-06-29 DIAGNOSIS — U071 COVID-19: Secondary | ICD-10-CM | POA: Insufficient documentation

## 2022-06-29 NOTE — Assessment & Plan Note (Signed)
Covid.  Normal speech.  Not disoriented or confused.  Okay for outpatient follow-up.  Discussed options.  Supportive care.  Routine masking and isolation discussed with patient.  Start Molnupiravir with rationale for use discussed with patient.  He agrees with plan.  He will update [place] as needed.

## 2022-07-08 ENCOUNTER — Telehealth: Payer: Self-pay | Admitting: Family Medicine

## 2022-07-08 NOTE — Telephone Encounter (Cosign Needed)
Spoke with patient; he will come by the office on Tuesday, January 2 to sign the paperwork.  Al Corpus, CPP notified  Claudina Lick, [place] Clinical Pharmacy Assistant [phone]

## 2022-07-08 NOTE — Telephone Encounter (Signed)
Patient called in requesting a phone call regarding insulin forms

## 2022-08-01 DIAGNOSIS — H353124 Nonexudative age-related macular degeneration, left eye, advanced atrophic with subfoveal involvement: Secondary | ICD-10-CM | POA: Diagnosis not present

## 2022-08-01 DIAGNOSIS — H353213 Exudative age-related macular degeneration, right eye, with inactive scar: Secondary | ICD-10-CM | POA: Diagnosis not present

## 2022-08-01 DIAGNOSIS — H348312 Tributary (branch) retinal vein occlusion, right eye, stable: Secondary | ICD-10-CM | POA: Diagnosis not present

## 2022-08-01 DIAGNOSIS — H353221 Exudative age-related macular degeneration, left eye, with active choroidal neovascularization: Secondary | ICD-10-CM | POA: Diagnosis not present

## 2022-08-16 DIAGNOSIS — H353221 Exudative age-related macular degeneration, left eye, with active choroidal neovascularization: Secondary | ICD-10-CM | POA: Diagnosis not present

## 2022-08-16 DIAGNOSIS — H43813 Vitreous degeneration, bilateral: Secondary | ICD-10-CM | POA: Diagnosis not present

## 2022-08-16 DIAGNOSIS — H348312 Tributary (branch) retinal vein occlusion, right eye, stable: Secondary | ICD-10-CM | POA: Diagnosis not present

## 2022-08-16 DIAGNOSIS — H31011 Macula scars of posterior pole (postinflammatory) (post-traumatic), right eye: Secondary | ICD-10-CM | POA: Diagnosis not present

## 2022-08-16 DIAGNOSIS — H353213 Exudative age-related macular degeneration, right eye, with inactive scar: Secondary | ICD-10-CM | POA: Diagnosis not present

## 2022-08-18 ENCOUNTER — Telehealth: Payer: Self-pay | Admitting: Pharmacist

## 2022-08-18 NOTE — Telephone Encounter (Signed)
[employer] patient assistance forms for 2024 renewal were faxed to company on 07/12/22. Pharmacy team reached out to [facility] [birth year] and told application was received, still processing. No update since then.

## 2022-08-18 NOTE — Telephone Encounter (Cosign Needed)
Approved through 07/11/2023. Patient is enrolled in auto-refill and medication will be sent to providers office. Medication will be delivered in 7-10 business days. Patient has been made aware.  Charlene Brooke, PharmD notified  Marijean Niemann, [place] Clinical Pharmacy Assistant [phone]

## 2022-09-13 ENCOUNTER — Other Ambulatory Visit: Payer: Self-pay | Admitting: Cardiovascular Disease

## 2022-09-19 DIAGNOSIS — H541142 Blindness right eye category 4, low vision left eye category 2: Secondary | ICD-10-CM | POA: Diagnosis not present

## 2022-09-19 DIAGNOSIS — H53413 Scotoma involving central area, bilateral: Secondary | ICD-10-CM | POA: Diagnosis not present

## 2022-09-27 DIAGNOSIS — H53413 Scotoma involving central area, bilateral: Secondary | ICD-10-CM | POA: Diagnosis not present

## 2022-09-27 DIAGNOSIS — H541142 Blindness right eye category 4, low vision left eye category 2: Secondary | ICD-10-CM | POA: Diagnosis not present

## 2022-10-04 DIAGNOSIS — H53413 Scotoma involving central area, bilateral: Secondary | ICD-10-CM | POA: Diagnosis not present

## 2022-10-04 DIAGNOSIS — H541142 Blindness right eye category 4, low vision left eye category 2: Secondary | ICD-10-CM | POA: Diagnosis not present

## 2022-10-06 ENCOUNTER — Other Ambulatory Visit: Payer: Self-pay | Admitting: Family Medicine

## 2022-10-06 ENCOUNTER — Ambulatory Visit (INDEPENDENT_AMBULATORY_CARE_PROVIDER_SITE_OTHER): Payer: Medicare Other | Admitting: Family Medicine

## 2022-10-06 ENCOUNTER — Encounter: Payer: Self-pay | Admitting: Family Medicine

## 2022-10-06 VITALS — BP 138/64 | HR 59 | Temp 97.3°F | Ht 68.0 in | Wt 205.0 lb

## 2022-10-06 DIAGNOSIS — E785 Hyperlipidemia, unspecified: Secondary | ICD-10-CM | POA: Diagnosis not present

## 2022-10-06 DIAGNOSIS — E1169 Type 2 diabetes mellitus with other specified complication: Secondary | ICD-10-CM

## 2022-10-06 DIAGNOSIS — L989 Disorder of the skin and subcutaneous tissue, unspecified: Secondary | ICD-10-CM | POA: Diagnosis not present

## 2022-10-06 MED ORDER — LANTUS SOLOSTAR 100 UNIT/ML ~~LOC~~ SOPN: PEN_INJECTOR | SUBCUTANEOUS | Status: DC

## 2022-10-06 NOTE — Patient Instructions (Addendum)
Go to the lab on the way out.   If you have mychart we'll likely use that to update you.    Take care.  Glad to see you. Plan on recheck in about 6 months.  Yearly visit.

## 2022-10-06 NOTE — Progress Notes (Signed)
Diabetes:  Using medications without difficulties: yes Hypoglycemic episodes: no Hyperglycemic episodes:no Feet problems:no Blood Sugars averaging: prev 120 then up to ~150 more recently (over the last 6 weeks).  No clear trigger for that.   eye exam within last year: Taking 90 units daily prev, now 100 units since sugar increased.   Metformin 500mg  BID.    Fell in the winter, on ice.  Cautions d/w pt.   He is trying to adjust to lower visual acuity.  Dicussed.  He is trying to maintain L eye vision.  He is trying to make accommodations in his daily routine.  He has 3 small clustered lesions on the L forearm, prev frozen and improved then returned.  Discussed derm referral, offered.   Meds, vitals, and allergies reviewed.   ROS: Per HPI unless specifically indicated in ROS section   GEN: nad, alert and oriented HEENT: ncat NECK: supple w/o LA CV: rrr. PULM: ctab, no inc wob ABD: soft, +bs EXT: no edema SKIN: no acute rash, left forearm with 5mm healed scar, 1cm irritated lesion w/o ulcer, and 2mm irritated lesion w/o ulcer  30 minutes were devoted to patient care in this encounter (this includes time spent reviewing the patient's file/history, interviewing and examining the patient, counseling/reviewing plan with patient).

## 2022-10-09 NOTE — Assessment & Plan Note (Addendum)
3 small lesions in the left forearm.  1 appears to have healed.  Neither of the others appears alarming.  Discussed options with dermatology evaluation versus refreezing versus observation.  He opted for observation and I think this is reasonable.  Not appear cancerous.  If any enlargement/evolution then he can let me know.

## 2022-10-09 NOTE — Assessment & Plan Note (Signed)
See notes on labs.  Plan on recheck in 6 months. He likely has had some worsening of his pancreatic function in the meantime requiring increased insulin dose.  Discussed. Taking 90 units daily prev, now 100 units since sugar increased.   Metformin 500mg  BID.   Continue medication as is.  Continue work on diet.

## 2022-10-10 LAB — MICROALBUMIN / CREATININE URINE RATIO
Creatinine, Urine: 86 mg/dL (ref 20–320)
Microalb Creat Ratio: 751 mg/g creat — ABNORMAL HIGH (ref <30)
Microalb, Ur: 64.6 mg/dL

## 2022-10-10 LAB — HEMOGLOBIN A1C
Hgb A1c MFr Bld: 8.5 % of total Hgb — ABNORMAL HIGH (ref <5.7)
Mean Plasma Glucose: 197 mg/dL
eAG (mmol/L): 10.9 mmol/L

## 2022-10-10 LAB — FRUCTOSAMINE: Fructosamine: 276 umol/L (ref 205–285)

## 2022-10-11 DIAGNOSIS — H353221 Exudative age-related macular degeneration, left eye, with active choroidal neovascularization: Secondary | ICD-10-CM | POA: Diagnosis not present

## 2022-10-11 DIAGNOSIS — H43813 Vitreous degeneration, bilateral: Secondary | ICD-10-CM | POA: Diagnosis not present

## 2022-10-11 DIAGNOSIS — H31011 Macula scars of posterior pole (postinflammatory) (post-traumatic), right eye: Secondary | ICD-10-CM | POA: Diagnosis not present

## 2022-10-11 DIAGNOSIS — H348312 Tributary (branch) retinal vein occlusion, right eye, stable: Secondary | ICD-10-CM | POA: Diagnosis not present

## 2022-10-11 DIAGNOSIS — H353213 Exudative age-related macular degeneration, right eye, with inactive scar: Secondary | ICD-10-CM | POA: Diagnosis not present

## 2022-10-12 DIAGNOSIS — H541142 Blindness right eye category 4, low vision left eye category 2: Secondary | ICD-10-CM | POA: Diagnosis not present

## 2022-10-12 DIAGNOSIS — H53413 Scotoma involving central area, bilateral: Secondary | ICD-10-CM | POA: Diagnosis not present

## 2022-10-17 ENCOUNTER — Telehealth: Payer: Self-pay | Admitting: Family Medicine

## 2022-10-17 NOTE — Telephone Encounter (Signed)
Patient called our office requesting to speak with someone regarding his labs from 3/28. States he has a hard time seeing messages via mychart on the computer and would like for someone to verbally explain to him. Would like a call back at [phone].

## 2022-10-17 NOTE — Telephone Encounter (Signed)
Called and advised patient of lab result message from Dr. Para March that was released via mychart. Scheduled patient 6 mo f/u

## 2022-10-18 ENCOUNTER — Other Ambulatory Visit: Payer: Self-pay | Admitting: Family Medicine

## 2022-10-19 DIAGNOSIS — H541142 Blindness right eye category 4, low vision left eye category 2: Secondary | ICD-10-CM | POA: Diagnosis not present

## 2022-10-19 DIAGNOSIS — H53413 Scotoma involving central area, bilateral: Secondary | ICD-10-CM | POA: Diagnosis not present

## 2022-10-19 NOTE — Telephone Encounter (Signed)
Refill request Metformin Last refill 04/14/22 #180/1 Last office visit 10/06/22 See allergy/contraindication

## 2022-10-19 NOTE — Telephone Encounter (Signed)
Okay to continue?

## 2022-10-26 DIAGNOSIS — H53413 Scotoma involving central area, bilateral: Secondary | ICD-10-CM | POA: Diagnosis not present

## 2022-10-26 DIAGNOSIS — H541142 Blindness right eye category 4, low vision left eye category 2: Secondary | ICD-10-CM | POA: Diagnosis not present

## 2022-11-01 DIAGNOSIS — H541142 Blindness right eye category 4, low vision left eye category 2: Secondary | ICD-10-CM | POA: Diagnosis not present

## 2022-11-01 DIAGNOSIS — H53413 Scotoma involving central area, bilateral: Secondary | ICD-10-CM | POA: Diagnosis not present

## 2022-11-07 DIAGNOSIS — H541142 Blindness right eye category 4, low vision left eye category 2: Secondary | ICD-10-CM | POA: Diagnosis not present

## 2022-11-07 DIAGNOSIS — H53413 Scotoma involving central area, bilateral: Secondary | ICD-10-CM | POA: Diagnosis not present

## 2022-11-14 DIAGNOSIS — H53413 Scotoma involving central area, bilateral: Secondary | ICD-10-CM | POA: Diagnosis not present

## 2022-11-14 DIAGNOSIS — H541142 Blindness right eye category 4, low vision left eye category 2: Secondary | ICD-10-CM | POA: Diagnosis not present

## 2022-11-21 DIAGNOSIS — H541142 Blindness right eye category 4, low vision left eye category 2: Secondary | ICD-10-CM | POA: Diagnosis not present

## 2022-11-21 DIAGNOSIS — H53413 Scotoma involving central area, bilateral: Secondary | ICD-10-CM | POA: Diagnosis not present

## 2022-11-22 DIAGNOSIS — H53413 Scotoma involving central area, bilateral: Secondary | ICD-10-CM | POA: Diagnosis not present

## 2022-11-22 DIAGNOSIS — H541142 Blindness right eye category 4, low vision left eye category 2: Secondary | ICD-10-CM | POA: Diagnosis not present

## 2022-11-29 ENCOUNTER — Telehealth: Payer: Self-pay

## 2022-11-29 NOTE — Progress Notes (Signed)
Care Management & Coordination Services Pharmacy Team  Reason for Encounter: Appointment Reminder  Contacted patient to confirm telephone appointment with Al Corpus, PharmD on 12/02/2022 at 11:00.  Spoke with patient on 11/29/2022   Do you have any problems getting your medications? No  What is your top health concern you would like to discuss at your upcoming visit? No concerns  Have you seen any other providers since your last visit with PCP? No  Star Rating Drugs:  Medication:  Last Fill: Day Supply Atorvastatin 40 mg 11/21/2022 90 Lisinopril 40 mg 11/04/2022 90 Metformin 500 mg 10/19/2022 90  Care Gaps: Annual wellness visit in last year? Yes 02/10/2022  If Diabetic: Last eye exam / retinopathy screening: Overdue Last diabetic foot exam: Up to date  Al Corpus, PharmD notified  Claudina Lick, [place] Clinical Pharmacy Assistant [phone]

## 2022-12-02 ENCOUNTER — Ambulatory Visit: Payer: Medicare Other | Admitting: Pharmacist

## 2022-12-02 NOTE — Progress Notes (Signed)
Care Management & Coordination Services Pharmacy Note  12/02/2022 Name:  [name] MRN:  [id number] DOB:  07-20-1945  Summary: F/U visit -HTN: BP at goal in 130s/70s generally -DM: fructosamine correlates with A1c 6.3%; pt gets Lantus through PAP and reports fasting BG range 120-150 generally   Recommendations/Changes made from today's visit: -No med changes  Follow up plan: -Health Concierge will call patient 6 months for DM update -Pharmacist follow up PRN -Vascular 02/20/23; PCP 04/18/23   Subjective: [name] is an 77 y.o. year old male who is a primary patient of [name], Dwana Curd, MD.  The care coordination team was consulted for assistance with disease management and care coordination needs.    Engaged with patient by telephone for follow up visit.  Recent office visits: 10/06/22 Dr [name] OV: A1c 8.5%, FA 276 (~A1c 6.3%)  Recent consult visits: None  Hospital visits: None in previous 6 months   Objective:  Lab Results  Component Value Date   CREATININE 1.28 03/23/2022   BUN 22 03/23/2022   GFR 54.20 (L) 03/23/2022   GFRNONAA >60 05/05/2021   GFRAA 56 (L) 11/22/2019   NA 142 03/23/2022   K 3.8 03/23/2022   CALCIUM 9.6 03/23/2022   CO2 30 03/23/2022   GLUCOSE 89 03/23/2022    Lab Results  Component Value Date/Time   HGBA1C 8.5 (H) 10/06/2022 02:39 PM   HGBA1C 8.9 (H) 03/23/2022 07:36 AM   HGBA1C 7.1 (H) 10/25/2014 03:02 AM   FRUCTOSAMINE 276 10/06/2022 02:39 PM   FRUCTOSAMINE 252 03/23/2022 07:36 AM   GFR 54.20 (L) 03/23/2022 07:36 AM   GFR 59.39 (L) 09/02/2021 09:49 AM   MICROALBUR 64.6 10/06/2022 02:39 PM    Last diabetic Eye exam: No results found for: "HMDIABEYEEXA"  Last diabetic Foot exam: No results found for: "HMDIABFOOTEX"   Lab Results  Component Value Date   CHOL 105 03/23/2022   HDL 31.40 (L) 03/23/2022   LDLCALC 43 03/23/2022   LDLDIRECT 39.0 02/10/2021   TRIG 152.0 (H) 03/23/2022   CHOLHDL 3 03/23/2022       Latest  Ref Rng & Units 03/23/2022    7:36 AM 05/05/2021    1:51 PM 02/10/2021    7:40 AM  Hepatic Function  Total Protein 6.0 - 8.3 g/dL 6.5  7.1  6.5   Albumin 3.5 - 5.2 g/dL 3.9  4.2  4.0   AST 0 - 37 U/L 15  19  13    ALT 0 - 53 U/L 15  17  13    Alk Phosphatase 39 - 117 U/L 35  45  42   Total Bilirubin 0.2 - 1.2 mg/dL 0.5  0.8  0.6     Lab Results  Component Value Date/Time   TSH 2.390 05/11/2009 09:25 PM       Latest Ref Rng & Units 03/23/2022    7:36 AM 05/06/2021    7:50 AM 05/05/2021   10:29 PM  CBC  WBC 4.0 - 10.5 K/uL 7.2     Hemoglobin 13.0 - 17.0 g/dL 40.9  81.1  91.4   Hematocrit 39.0 - 52.0 % 45.3   41.2   Platelets 150.0 - 400.0 K/uL 163.0       No results found for: "VD25OH", "VITAMINB12"  Clinical ASCVD: Yes  The ASCVD Risk score (Arnett DK, et al., 2019) failed to calculate for the following reasons:   The patient has a prior MI or stroke diagnosis        10/06/2022  2:10 PM 02/17/2022    1:03 PM 02/09/2021    8:21 AM  Depression screen PHQ 2/9  Decreased Interest 0 0 0  Down, Depressed, Hopeless 0 0 0  PHQ - 2 Score 0 0 0  Altered sleeping 0 0 0  Tired, decreased energy 0 0 0  Change in appetite 0 0 0  Feeling bad or failure about yourself  0 0 0  Trouble concentrating 0 0 0  Moving slowly or fidgety/restless 0 0 0  Suicidal thoughts 0 0 0  PHQ-9 Score 0 0 0  Difficult doing work/chores Not difficult at all Not difficult at all Not difficult at all     Social History   Tobacco Use  Smoking Status Former   Packs/day: 1.00   Years: 40.00   Additional pack years: 0.00   Total pack years: 40.00   Types: Cigarettes   Quit date: 10/21/1998   Years since quitting: 24.1  Smokeless Tobacco Never  Tobacco Comments   Past smoker, states he will smoke maybe 1 cigarette/ month or so still   BP Readings from Last 3 Encounters:  10/06/22 138/64  06/27/22 (!) 144/75  04/29/22 (!) 160/70   Pulse Readings from Last 3 Encounters:  10/06/22 (!) 59   04/29/22 63  04/07/22 66   Wt Readings from Last 3 Encounters:  10/06/22 205 lb (93 kg)  06/27/22 200 lb (90.7 kg)  04/29/22 203 lb 6 oz (92.3 kg)   BMI Readings from Last 3 Encounters:  10/06/22 31.17 kg/m  06/27/22 30.41 kg/m  04/29/22 30.92 kg/m    Allergies  Allergen Reactions   Augmentin [Amoxicillin-Pot Clavulanate] Other (See Comments)    Nausea,vomiting,diarrhea   Morphine And Codeine Nausea And Vomiting    Vomiting, GI upset   Metformin And Related Other (See Comments)    Intolerant of 2000mg  a day.     Medications Reviewed Today     Reviewed by Kathyrn Sheriff, [facility] (Pharmacist) on 12/02/22 at 1115  Med List Status: <None>   Medication Order Taking? Sig Documenting Provider Last Dose Status Informant  acetaminophen (TYLENOL) 325 MG tablet [id number] Yes Take 1-2 tablets (325-650 mg total) by mouth every 4 (four) hours as needed for mild pain (or temp >/= 101 F). Leonides Sake, MD Taking Active Spouse/Significant Other  albuterol [facility] HFA) 108 (90 BASE) MCG/ACT inhaler [id number] Yes Inhale 2 puffs into the lungs 3 (three) times daily as needed (cough). Joaquim Nam, MD Taking Active Spouse/Significant Other  amLODipine (NORVASC) 5 MG tablet [id number] Yes Take 1 tablet (5 mg) by mouth once daily at night Antonieta Iba, MD Taking Active   aspirin EC 81 MG tablet [id number] Yes Take 81 mg by mouth daily. Swallow whole. [provider] Taking Active   atorvastatin (LIPITOR) 40 MG tablet [id number] Yes TAKE 1 TABLET(40 MG) BY MOUTH DAILY Gollan, Tollie Pizza, MD Taking Active   blood glucose meter kit and supplies KIT [id number] Yes 1 each by Does not apply route daily as needed. Dispense based on patient and insurance preference. Use up to four times daily as directed. (FOR ICD-9 250.00, 250.01). Joaquim Nam, MD Taking Active Spouse/Significant Other  budesonide-formoterol [facility]) 160-4.5 MCG/ACT inhaler [id number] Yes Inhale 2 puffs into the  lungs 2 (two) times daily. Joaquim Nam, MD Taking Active            Med Note Kathyrn Sheriff   Fri Dec 02, 2022 11:15 AM)    clopidogrel (PLAVIX) 75  MG tablet [id number] Yes Take 1 tablet (75 mg total) by mouth daily. Antonieta Iba, MD Taking Active   EPINEPHrine 0.3 mg/0.3 mL IJ SOAJ injection [id number] Yes SMARTSIG:0.3 Milliliter(s) IM Once PRN [provider] Taking Active Spouse/Significant Other  famotidine (PEPCID) 20 MG tablet [id number] Yes Take 1 tablet (20 mg total) by mouth 2 (two) times daily. Irean Hong, MD Taking Active Spouse/Significant Other  fenofibrate (TRICOR) 145 MG tablet [id number] Yes Take 1 tablet (145 mg) by mouth once daily Antonieta Iba, MD Taking Active   glucosamine-chondroitin 500-400 MG tablet [id number] Yes Take 1 tablet by mouth 2 (two) times daily. [provider] Taking Active Spouse/Significant Other  glucose blood (ONETOUCH ULTRA) test strip [id number] Yes USE TO CHECK BLOOD SUGAR TWICE DAILY Joaquim Nam, MD Taking Active   insulin glargine (LANTUS SOLOSTAR) 100 UNIT/ML Solostar Pen [id number] Yes ADMINISTER 100 UNITS UNDER THE SKIN DAILY Joaquim Nam, MD Taking Active   Insulin Pen Needle (B-D UF III MINI PEN NEEDLES) 31G X 5 MM MISC [id number] Yes USE AS DIRECTED TO INJECT INSULIN EVERY DAY Joaquim Nam, MD Taking Active   Lancets [facility] ULTRASOFT) lancets [id number] Yes Use as instructed to check sugar, dx E11.9. Joaquim Nam, MD Taking Active Spouse/Significant Other  lisinopril (ZESTRIL) 40 MG tablet [id number] Yes Take 1 tablet (40 mg total) by mouth daily. Antonieta Iba, MD Taking Active   meclizine (ANTIVERT) 25 MG tablet [id number] Yes Take 25 mg by mouth every 6 (six) hours as needed. For dizziness/vertigo [provider] Taking Active Spouse/Significant Other  metFORMIN (GLUCOPHAGE) 500 MG tablet [id number] Yes TAKE 1 TABLET(500 MG) BY MOUTH TWICE DAILY WITH A MEAL Joaquim Nam, MD Taking  Active   metoprolol tartrate (LOPRESSOR) 25 MG tablet [id number] Yes Take 0.5 tablets (12.5 mg total) by mouth 2 (two) times daily. Antonieta Iba, MD Taking Active   Multiple Vitamins-Minerals (PRESERVISION AREDS 2 PO) [id number] Yes Take 1 tablet by mouth 2 (two) times daily. [provider] Taking Active Spouse/Significant Other  nitroGLYCERIN (NITROSTAT) 0.4 MG SL tablet [id number] Yes Place 1 tablet (0.4 mg total) under the tongue every 5 (five) minutes as needed. For chest pain Gollan, Tollie Pizza, MD Taking Active Spouse/Significant Other            SDOH:  (Social Determinants of Health) assessments and interventions performed: No SDOH Interventions    Flowsheet Row Clinical Support from 02/17/2022 in [facility] Chronic Care Management from 08/19/2021 in [facility] at [facility] Clinical Support from 02/09/2021 in [facility] at [facility] Chronic Care Management from 01/25/2021 in [facility] Chronic Care Management from 07/27/2020 in [facility] [place]  SDOH Interventions       Food Insecurity Interventions Intervention Not Indicated -- -- -- --  Housing Interventions Intervention Not Indicated -- -- -- --  Transportation Interventions Intervention Not Indicated -- -- -- --  Depression Interventions/Treatment  -- -- PHQ2-9 Score <4 Follow-up Not Indicated -- --  Financial Strain Interventions Intervention Not Indicated Other (Comment)  [Lantus PAP] -- Intervention Not Indicated Intervention Not Indicated  Physical Activity Interventions Intervention Not Indicated -- -- -- --  Stress Interventions Intervention Not Indicated -- -- -- --  Social Connections Interventions Intervention Not Indicated -- -- -- --       Medication Assistance:  Lantus - [employer] PAP 2024  Medication Access:  Name and location of current pharmacy:  [id number] - [place], [place] - [address] AT [facility] [address] [place] [address] Phone: [phone] Fax: [phone]  Within the past 30 days, how often has patient missed a dose of medication? 0 Is a pillbox or other method used to improve adherence? Yes  Factors that may affect medication adherence? no barriers identified Are meds synced by current pharmacy? No  Are meds delivered by current pharmacy? No  Does patient experience delays in picking up medications due to transportation concerns? No   Compliance/Adherence/Medication fill history: Care Gaps: Eye exam (due 08/2021)  Star-Rating Drugs: Atorvastatin - PDC 99% Lisinopril - PDC 93% Metformin - PDC 97%   Assessment/Plan  Hypertension (BP goal <140/90) -Not ideally controlled- BP recently elevated at home, cardiology increased amlodipine to 5 mg and pt is compliant with this for the past few days -Current home readings: 134/78 -Denies hypotensive/hypertensive symptoms -Current treatment: Lisinopril 40 mg daily - Appropriate, Effective, Safe, Accessible Metoprolol tartrate 25 mg - 1/2 tablet twice daily -Appropriate, Effective, Safe, Accessible Amlodipine 5 mg daily -Appropriate, Effective, Safe, Accessible -Medications previously tried: HCTZ  -Educated on BP goals and benefits of medications for prevention of heart attack, stroke and kidney damage; -Counseled to monitor BP at home daily - call cardiology in 2 weeks with update -Recommended to continue current medication  Diabetes (Fructosamine < 317 or A1C < 7%) -Controlled -  A1c 8.5 (09/2022) % is inaccurate, using fructosamine to monitor DM; last fructosamine 267 corresponds to A1c 6.2%  -Current home glucose readings:  fasting: 164, 135, 120-150 range generally -Denies hypoglycemic/hyperglycemic symptoms -Current medications: Lantus Solostar (PAP) 100 units daily - Appropriate, Effective, Safe,  Accessible Metformin 500 mg BID - Appropriate, Effective, Safe, Accessible -Medications previously tried: glipizide, max dose metformin 1000 mg/day  -Recommended to continue current medicatio  Hyperlipidemia: (LDL goal < 70) -Controlled - LDL 43 (03/2022) at goal -Hx CAD, PAD, stent -Current treatment: Atorvastatin 40 mg daily - Appropriate, Effective, Safe, Accessible Fenofibrate 145 mg daily -Appropriate, Effective, Safe, Accessible Clopidogrel 75 mg daily -Appropriate, Effective, Safe, Accessible Aspirin 81 mg daily -Appropriate, Effective, Safe, Accessible Nitroglycerin 0.4 mg SL prn -Appropriate, Effective, Safe, Accessible -Medications previously tried: Brilinta  -Educated on Cholesterol goals; Benefits of statin for ASCVD risk reduction; -Recommended to continue current medication  COPD (Goal: control symptoms and prevent exacerbations) -Controlled - wife says he wheezes occasionally, pt does not think so -Former smoker (quit 2000) -Gold Grade: unknown -Current COPD Classification:  A (low sx, <2 exacerbations/yr) -Pulmonary function testing: not on file -Exacerbations requiring treatment in last 6 months: 0 -Current treatment  Symbicort 160-4.5 mcg/act 2 puff BID  -Appropriate, Effective, Safe, Accessible Albuterol HFA prn -Appropriate, Effective, Safe, Accessible -Medications previously tried: n/a  -Patient reports consistent use of maintenance inhaler -Frequency of rescue inhaler use: rare -Counseled on Proper inhaler technique; Benefits of consistent maintenance inhaler use -Recommended to continue current medication   Al Corpus, PharmD, P[place]y [place], CPP Clinical Pharmacist Practitioner [facility] at [facility] [phone]

## 2022-12-06 DIAGNOSIS — H353221 Exudative age-related macular degeneration, left eye, with active choroidal neovascularization: Secondary | ICD-10-CM | POA: Diagnosis not present

## 2022-12-06 DIAGNOSIS — H348312 Tributary (branch) retinal vein occlusion, right eye, stable: Secondary | ICD-10-CM | POA: Diagnosis not present

## 2022-12-06 DIAGNOSIS — H43813 Vitreous degeneration, bilateral: Secondary | ICD-10-CM | POA: Diagnosis not present

## 2022-12-06 DIAGNOSIS — H353213 Exudative age-related macular degeneration, right eye, with inactive scar: Secondary | ICD-10-CM | POA: Diagnosis not present

## 2022-12-06 DIAGNOSIS — H31011 Macula scars of posterior pole (postinflammatory) (post-traumatic), right eye: Secondary | ICD-10-CM | POA: Diagnosis not present

## 2022-12-09 DIAGNOSIS — H53413 Scotoma involving central area, bilateral: Secondary | ICD-10-CM | POA: Diagnosis not present

## 2022-12-09 DIAGNOSIS — H541142 Blindness right eye category 4, low vision left eye category 2: Secondary | ICD-10-CM | POA: Diagnosis not present

## 2022-12-19 DIAGNOSIS — H541142 Blindness right eye category 4, low vision left eye category 2: Secondary | ICD-10-CM | POA: Diagnosis not present

## 2022-12-19 DIAGNOSIS — H53413 Scotoma involving central area, bilateral: Secondary | ICD-10-CM | POA: Diagnosis not present

## 2022-12-26 DIAGNOSIS — H53413 Scotoma involving central area, bilateral: Secondary | ICD-10-CM | POA: Diagnosis not present

## 2022-12-26 DIAGNOSIS — H541142 Blindness right eye category 4, low vision left eye category 2: Secondary | ICD-10-CM | POA: Diagnosis not present

## 2023-02-07 DIAGNOSIS — H353213 Exudative age-related macular degeneration, right eye, with inactive scar: Secondary | ICD-10-CM | POA: Diagnosis not present

## 2023-02-07 DIAGNOSIS — H348312 Tributary (branch) retinal vein occlusion, right eye, stable: Secondary | ICD-10-CM | POA: Diagnosis not present

## 2023-02-07 DIAGNOSIS — H43813 Vitreous degeneration, bilateral: Secondary | ICD-10-CM | POA: Diagnosis not present

## 2023-02-07 DIAGNOSIS — H31011 Macula scars of posterior pole (postinflammatory) (post-traumatic), right eye: Secondary | ICD-10-CM | POA: Diagnosis not present

## 2023-02-07 DIAGNOSIS — H353221 Exudative age-related macular degeneration, left eye, with active choroidal neovascularization: Secondary | ICD-10-CM | POA: Diagnosis not present

## 2023-02-16 ENCOUNTER — Other Ambulatory Visit (INDEPENDENT_AMBULATORY_CARE_PROVIDER_SITE_OTHER): Payer: Self-pay | Admitting: Vascular Surgery

## 2023-02-16 DIAGNOSIS — H53413 Scotoma involving central area, bilateral: Secondary | ICD-10-CM | POA: Diagnosis not present

## 2023-02-16 DIAGNOSIS — I714 Abdominal aortic aneurysm, without rupture, unspecified: Secondary | ICD-10-CM

## 2023-02-16 DIAGNOSIS — H541142 Blindness right eye category 4, low vision left eye category 2: Secondary | ICD-10-CM | POA: Diagnosis not present

## 2023-02-16 DIAGNOSIS — Z9889 Other specified postprocedural states: Secondary | ICD-10-CM

## 2023-02-17 ENCOUNTER — Other Ambulatory Visit (INDEPENDENT_AMBULATORY_CARE_PROVIDER_SITE_OTHER): Payer: Self-pay | Admitting: Vascular Surgery

## 2023-02-17 DIAGNOSIS — I6523 Occlusion and stenosis of bilateral carotid arteries: Secondary | ICD-10-CM

## 2023-02-18 NOTE — Progress Notes (Signed)
MRN : [id number]  [name] is a 77 y.o. (Nov 19, 1945) male who presents with chief complaint of check circulation.  History of Present Illness:   The patient returns to the office for followup and review of the noninvasive studies. There have been no interval changes in lower extremity symptoms. No interval shortening of the patient's claudication distance or development of rest pain symptoms. No new ulcers or wounds have occurred since the last visit.   He is also followed for carotid stenosis.  He is s/p left CEA on 09/21/2018.  The carotid stenosis followed by ultrasound.    The patient denies amaurosis fugax. There is no recent history of TIA symptoms or focal motor deficits. There is no prior documented CVA.   The patient is taking enteric-coated aspirin 81 mg daily.   The patient is also followed for  a known abdominal aortic aneurysm. Patient denies abdominal pain or back pain, no other abdominal complaints. No changes suggesting embolic episodes.    There have been no significant changes to the patient's overall health care.   The patient denies history of DVT, PE or superficial thrombophlebitis. The patient denies recent episodes of angina or shortness of breath.    ABI Rt=1.09 and Lt=1.09  (previous ABI's Rt=1.08 and Lt=1.03 )   Carotid duplex done today shows RICA 1-39% and LICA widely patent s/p CEA.  No change compared to last study in 07/2020 . The bilateral vertebral arteries demonstrate antegrade flow with normal flow hemodynamics in the bilateral subclavian arteries.   Abdominal aortic duplex done today shows an AAA that measures 2.7 cm.   Previous duplex ultrasound of the bilateral lower extremities shows patent right without hemodynamically significant stenosis, on the left there is moderate stenosis of the origin of the left profunda femoris artery but the bypass is widely patent.  Rt  popliteal artery measures 1.01 cm  No outpatient medications have been marked as taking for the 02/20/23 encounter (Appointment) with Gilda Crease, Latina Craver, MD.    Past Medical History:  Diagnosis Date   CAD (coronary artery disease)    a. 1999: PCI-->LAD 2/2 MI; b. 2005: inf MI s/p PCI/DES x 4 to RCA; c. Myoview in 12/2005: EF 61%, no evidence for ischemia; d. cath 10/2014: occluded mLAD w/ L to L and L to R collats, dRCA 95% s/p PCI/DES 0%, mPDA 70%, LCx mild to mod irregs, procedure complicated by inf ST ele in recovery, repeat cath showed acute dRCA stent thrombosis o/w occluded mRPDA, PTCA dRCA, PCI/DES RPDA, aggrastat x 18 hr   CKD (chronic kidney disease)    COPD (chronic obstructive pulmonary disease) (HCC)    DM2 (diabetes mellitus, type 2) (HCC)    H/O hiatal hernia    Heart attack (HCC) [phone]   had 2 in one day(released plaque during angio). 5 stents total   HTN (hypertension)    Hypercholesterolemia    Impaired fasting glucose    Elevated after steroid injection   Macular degeneration of right eye 2020   receiving injections into eye   Osteoarthritis of hip    injections in  both hips   Osteoarthritis, knee    PAD (peripheral artery disease) (HCC)    L fem to below the knee popliteal vein bypass graft   Peri-rectal abscess 12/27/2011   Tobacco abuse    Prior    Past Surgical History:  Procedure Laterality Date   CORONARY ANGIOPLASTY     CORONARY STENT PLACEMENT  1999, 2006, 2016 x 2   Multiple, LAD in 1999, RCA in 2005.  has a total of 4 stents   ENDARTERECTOMY Left 09/21/2018   Procedure: ENDARTERECTOMY CAROTID;  Surgeon: Renford Dills, MD;  Location: [facility];  Service: Vascular;  Laterality: Left;   FEMORAL-POPLITEAL BYPASS GRAFT  2016   L fem to below the knee popliteal vein bypass graft   INCISE AND DRAIN ABCESS  2007   on scrotum   INCISION AND DRAINAGE PERIRECTAL ABSCESS  12/28/2011   Procedure: IRRIGATION AND DEBRIDEMENT PERIRECTAL ABSCESS;  Surgeon:  Caleen Essex III, MD;  Location: [facility] OR;  Service: General;  Laterality: N/A;    Social History Social History   Tobacco Use   Smoking status: Former    Current packs/day: 0.00    Average packs/day: 1 pack/day for 40.0 years (40.0 ttl pk-yrs)    Types: Cigarettes    Start date: 10/21/1958    Quit date: 10/21/1998    Years since quitting: 24.3   Smokeless tobacco: Never   Tobacco comments:    Past smoker, states he will smoke maybe 1 cigarette/ month or so still  Vaping Use   Vaping status: Never Used  Substance Use Topics   Alcohol use: Not Currently    Alcohol/week: 0.0 standard drinks of alcohol    Comment: Occasional beer on the weekends   Drug use: No    Family History Family History  Problem Relation Age of Onset   Heart failure Father        CHF   CAD Mother    Diabetes Mother    Colon cancer Neg Hx    Prostate cancer Neg Hx     Allergies  Allergen Reactions   Augmentin [Amoxicillin-Pot Clavulanate] Other (See Comments)    Nausea,vomiting,diarrhea   Morphine And Codeine Nausea And Vomiting    Vomiting, GI upset   Metformin And Related Other (See Comments)    Intolerant of 2000mg  a day.      REVIEW OF SYSTEMS (Negative unless checked)  Constitutional: [] Weight loss  [] Fever  [] Chills Cardiac: [] Chest pain   [] Chest pressure   [] Palpitations   [] Shortness of breath when laying flat   [] Shortness of breath with exertion. Vascular:  [x] Pain in legs with walking   [] Pain in legs at rest  [] History of DVT   [] Phlebitis   [] Swelling in legs   [] Varicose veins   [] Non-healing ulcers Pulmonary:   [] Uses home oxygen   [] Productive cough   [] Hemoptysis   [] Wheeze  [] COPD   [] Asthma Neurologic:  [] Dizziness   [] Seizures   [] History of stroke   [] History of TIA  [] Aphasia   [] Vissual changes   [] Weakness or numbness in arm   [] Weakness or numbness in leg Musculoskeletal:   [] Joint swelling   [] Joint pain   [] Low back pain Hematologic:  [] Easy bruising  [] Easy bleeding    [] Hypercoagulable state   [] Anemic Gastrointestinal:  [] Diarrhea   [] Vomiting  [] Gastroesophageal reflux/heartburn   [] Difficulty swallowing. Genitourinary:  [] Chronic kidney disease   [] Difficult urination  [] Frequent urination   [] Blood in urine Skin:  [] Rashes   [] Ulcers  Psychological:  [] History of anxiety   []  History of major depression.  Physical Examination  There were no vitals filed for this visit. There is no height or weight on file to calculate BMI. Gen: WD/WN, NAD Head: [place]/AT, No temporalis wasting.  Ear/Nose/Throat: Hearing grossly intact, nares w/o erythema or drainage Eyes: PER, EOMI, sclera nonicteric.  Neck: Supple, no masses.  No bruit or JVD.  Pulmonary:  Good air movement, no audible wheezing, no use of accessory muscles.  Cardiac: RRR, normal S1, S2, no Murmurs. Vascular:  mild trophic changes, no open wounds Vessel Right Left  Radial Palpable Palpable  PT  Palpable  Palpable  DP Palpable Palpable  Gastrointestinal: soft, non-distended. No guarding/no peritoneal signs.  Musculoskeletal: M/S 5/5 throughout.  No visible deformity.  Neurologic: CN 2-12 intact. Pain and light touch intact in extremities.  Symmetrical.  Speech is fluent. Motor exam as listed above. Psychiatric: Judgment intact, Mood & affect appropriate for pt's clinical situation. Dermatologic: No rashes or ulcers noted.  No changes consistent with cellulitis.   CBC Lab Results  Component Value Date   WBC 7.2 03/23/2022   HGB 15.4 03/23/2022   HCT 45.3 03/23/2022   MCV 92.6 03/23/2022   PLT 163.0 03/23/2022    BMET    Component Value Date/Time   NA 142 03/23/2022 0736   NA 134 (L) 10/29/2014 0908   K 3.8 03/23/2022 0736   K 4.3 10/29/2014 0908   CL 103 03/23/2022 0736   CL 103 10/29/2014 0908   CO2 30 03/23/2022 0736   CO2 24 10/29/2014 0908   GLUCOSE 89 03/23/2022 0736   GLUCOSE 142 (H) 10/29/2014 0908   BUN 22 03/23/2022 0736   BUN 20 10/29/2014 0908   CREATININE 1.28  03/23/2022 0736   CREATININE 1.32 (H) 10/29/2014 0908   CREATININE 0.96 01/28/2011 0855   CALCIUM 9.6 03/23/2022 0736   CALCIUM 9.3 10/29/2014 0908   GFRNONAA >60 05/05/2021 1351   GFRNONAA 55 (L) 10/29/2014 0908   GFRAA 56 (L) 11/22/2019 0952   GFRAA >60 10/29/2014 0908   CrCl cannot be calculated (Patient's most recent lab result is older than the maximum 21 days allowed.).  COAG Lab Results  Component Value Date   INR 1.0 05/05/2021   INR 1.0 09/14/2018   INR 0.9 10/24/2014    Radiology No results found.   Assessment/Plan 1. Carotid artery stenosis without cerebral infarction, left Recommend:   Given the patient's asymptomatic subcritical stenosis no further invasive testing or surgery at this time.   Duplex ultrasound shows RICA <40% and LICA 40-59% stenosis s/p left CEA.   Continue antiplatelet therapy as prescribed Continue management of CAD, HTN and Hyperlipidemia Healthy heart diet,  encouraged exercise at least 4 times per week Follow up in 12 months with duplex ultrasound and physical exam   - VAS US CAROTID; Future  2. Popliteal artery aneurysm (HCC) No surgery or intervention at this time.   The patient has an asymptomatic popliteal artery aneurysm that is less than 2.5 cm in maximal diameter on the right and is s/p bypass on the left.  I have discussed the natural history of popliteal aneurysm and the small risk of thrombosis for aneurysm less than 2.5 cm in size.  However, as these small aneurysms tend to enlarge over time, continued surveillance with ultrasound is mandatory.   I have also discussed optimizing medical management with hypertension and lipid control and the importance of abstinence from tobacco.  The patient is also encouraged  to exercise a minimum of 30 minutes 4 times a week.    Should the patient develop new leg pain or signs of peripheral embolization they are instructed to seek medical attention immediately and to alert the physician  providing care that they have an aneurysm.  The patient voices their understanding.  - VAS [place] LOWER EXTREMITY ARTERIAL DUPLEX; Future  3. Abdominal aortic aneurysm (AAA) without rupture, unspecified part (HCC) No surgery or intervention at this time. The patient has an asymptomatic abdominal aortic aneurysm that is less than 4 cm in maximal diameter.  I have discussed the natural history of abdominal aortic aneurysm and the small risk of rupture for aneurysm less than 5 cm in size.  However, as these small aneurysms tend to enlarge over time, continued surveillance with ultrasound or CT scan is mandatory.  I have also discussed optimizing medical management with hypertension and lipid control and the importance of abstinence from tobacco.  The patient is also encouraged to exercise a minimum of 30 minutes 4 times a week.  Should the patient develop new onset abdominal or back pain or signs of peripheral embolization they are instructed to seek medical attention immediately and to alert the physician providing care that they have an aneurysm.  The patient voices their understanding. The patient will return in 12 months with an aortic duplex.   4. Atherosclerosis of native artery of both lower extremities with intermittent claudication (HCC) Recommend:   The patient has evidence of atherosclerosis of the lower extremities with claudication.  The patient does not voice lifestyle limiting changes at this point in time.   Noninvasive studies do not suggest clinically significant change.   No invasive studies, angiography or surgery at this time The patient should continue walking and begin a more formal exercise program.  The patient should continue antiplatelet therapy and aggressive treatment of the lipid abnormalities   No changes in the patient's medications at this time - VAS [place] ABI WITH/WO TBI; Future  5. Coronary artery disease of native artery of native heart with stable angina pectoris  (HCC) Continue cardiac and antihypertensive medications as already ordered and reviewed, no changes at this time.  Continue statin as ordered and reviewed, no changes at this time  Nitrates PRN for chest pain  6. Essential hypertension Continue antihypertensive medications as already ordered, these medications have been reviewed and there are no changes at this time.    Levora Dredge, MD  02/18/2023 1:44 PM

## 2023-02-20 ENCOUNTER — Ambulatory Visit (INDEPENDENT_AMBULATORY_CARE_PROVIDER_SITE_OTHER): Payer: Medicare Other

## 2023-02-20 ENCOUNTER — Ambulatory Visit (INDEPENDENT_AMBULATORY_CARE_PROVIDER_SITE_OTHER): Payer: Medicare Other | Admitting: Vascular Surgery

## 2023-02-20 VITALS — BP 133/65 | HR 56 | Resp 18 | Ht 68.0 in | Wt 202.8 lb

## 2023-02-20 VITALS — Ht 68.0 in | Wt 202.0 lb

## 2023-02-20 DIAGNOSIS — I25118 Atherosclerotic heart disease of native coronary artery with other forms of angina pectoris: Secondary | ICD-10-CM

## 2023-02-20 DIAGNOSIS — I6522 Occlusion and stenosis of left carotid artery: Secondary | ICD-10-CM | POA: Diagnosis not present

## 2023-02-20 DIAGNOSIS — I724 Aneurysm of artery of lower extremity: Secondary | ICD-10-CM

## 2023-02-20 DIAGNOSIS — I739 Peripheral vascular disease, unspecified: Secondary | ICD-10-CM

## 2023-02-20 DIAGNOSIS — I6523 Occlusion and stenosis of bilateral carotid arteries: Secondary | ICD-10-CM | POA: Diagnosis not present

## 2023-02-20 DIAGNOSIS — I714 Abdominal aortic aneurysm, without rupture, unspecified: Secondary | ICD-10-CM

## 2023-02-20 DIAGNOSIS — I70213 Atherosclerosis of native arteries of extremities with intermittent claudication, bilateral legs: Secondary | ICD-10-CM | POA: Diagnosis not present

## 2023-02-20 DIAGNOSIS — Z0000 Encounter for general adult medical examination without abnormal findings: Secondary | ICD-10-CM

## 2023-02-20 DIAGNOSIS — Z9889 Other specified postprocedural states: Secondary | ICD-10-CM

## 2023-02-20 DIAGNOSIS — I10 Essential (primary) hypertension: Secondary | ICD-10-CM

## 2023-02-20 NOTE — Progress Notes (Signed)
Subjective:   [name] is a 77 y.o. male who presents for Medicare Annual/Subsequent preventive examination.  Visit Complete: Virtual  I connected with  [name] on 02/20/23 by a audio enabled telemedicine application and verified that I am speaking with the correct person using two identifiers.  Patient Location: Home  Provider Location: Home Office  I discussed the limitations of evaluation and management by telemedicine. The patient expressed understanding and agreed to proceed.  Vital Signs: Unable to obtain new vitals due to this being a telehealth visit. HT and WT reported by patient.  Review of Systems      Cardiac Risk Factors include: advanced age (>55men, >3 women);sedentary lifestyle;hypertension;male gender;diabetes mellitus;dyslipidemia     Objective:    Today's Vitals   02/20/23 1355  Weight: 202 lb (91.6 kg)  Height: 5\' 8"  (1.727 m)   Body mass index is 30.71 kg/m.     02/20/2023    2:04 PM 02/17/2022    1:05 PM 05/05/2021    1:51 PM 02/09/2021    8:20 AM 11/22/2019    9:51 AM 11/21/2019    4:19 PM 09/14/2018    9:42 AM  Advanced Directives  Does Patient Have a Medical Advance Directive? No No No No No No No  Does patient want to make changes to medical advance directive?    No - Patient declined     Would patient like information on creating a medical advance directive? No - Patient declined No - Patient declined     No - Patient declined    Current Medications (verified) Outpatient Encounter Medications as of 02/20/2023  Medication Sig   acetaminophen (TYLENOL) 325 MG tablet Take 1-2 tablets (325-650 mg total) by mouth every 4 (four) hours as needed for mild pain (or temp >/= 101 F).   albuterol (PROAIR HFA) 108 (90 BASE) MCG/ACT inhaler Inhale 2 puffs into the lungs 3 (three) times daily as needed (cough).   amLODipine (NORVASC) 5 MG tablet Take 1 tablet (5 mg) by mouth once daily at night   aspirin EC 81 MG tablet Take 81 mg by mouth  daily. Swallow whole.   atorvastatin (LIPITOR) 40 MG tablet TAKE 1 TABLET(40 MG) BY MOUTH DAILY   blood glucose meter kit and supplies KIT 1 each by Does not apply route daily as needed. Dispense based on patient and insurance preference. Use up to four times daily as directed. (FOR ICD-9 250.00, 250.01).   budesonide-formoterol (SYMBICORT) 160-4.5 MCG/ACT inhaler Inhale 2 puffs into the lungs 2 (two) times daily.   clopidogrel (PLAVIX) 75 MG tablet Take 1 tablet (75 mg total) by mouth daily.   EPINEPHrine 0.3 mg/0.3 mL IJ SOAJ injection SMARTSIG:0.3 Milliliter(s) IM Once PRN   famotidine (PEPCID) 20 MG tablet Take 1 tablet (20 mg total) by mouth 2 (two) times daily.   fenofibrate (TRICOR) 145 MG tablet Take 1 tablet (145 mg) by mouth once daily   glucosamine-chondroitin 500-400 MG tablet Take 1 tablet by mouth 2 (two) times daily.   glucose blood (ONETOUCH ULTRA) test strip USE TO CHECK BLOOD SUGAR TWICE DAILY   insulin glargine (LANTUS SOLOSTAR) 100 UNIT/ML Solostar Pen ADMINISTER 100 UNITS UNDER THE SKIN DAILY   Insulin Pen Needle (B-D UF III MINI PEN NEEDLES) 31G X 5 MM MISC USE AS DIRECTED TO INJECT INSULIN EVERY DAY   Lancets (ONETOUCH ULTRASOFT) lancets Use as instructed to check sugar, dx E11.9.   lisinopril (ZESTRIL) 40 MG tablet Take 1 tablet (40 mg  total) by mouth daily.   meclizine (ANTIVERT) 25 MG tablet Take 25 mg by mouth every 6 (six) hours as needed. For dizziness/vertigo   metFORMIN (GLUCOPHAGE) 500 MG tablet TAKE 1 TABLET(500 MG) BY MOUTH TWICE DAILY WITH A MEAL   metoprolol tartrate (LOPRESSOR) 25 MG tablet Take 0.5 tablets (12.5 mg total) by mouth 2 (two) times daily.   Multiple Vitamins-Minerals (PRESERVISION AREDS 2 PO) Take 1 tablet by mouth 2 (two) times daily.   nitroGLYCERIN (NITROSTAT) 0.4 MG SL tablet Place 1 tablet (0.4 mg total) under the tongue every 5 (five) minutes as needed. For chest pain   No facility-administered encounter medications on file as of 02/20/2023.     Allergies (verified) Augmentin [amoxicillin-pot clavulanate], Morphine and codeine, and Metformin and related   History: Past Medical History:  Diagnosis Date   CAD (coronary artery disease)    a. 1999: PCI-->LAD 2/2 MI; b. 2005: inf MI s/p PCI/DES x 4 to RCA; c. Myoview in 12/2005: EF 61%, no evidence for ischemia; d. cath 10/2014: occluded mLAD w/ L to L and L to R collats, dRCA 95% s/p PCI/DES 0%, mPDA 70%, LCx mild to mod irregs, procedure complicated by inf ST ele in recovery, repeat cath showed acute dRCA stent thrombosis o/w occluded mRPDA, PTCA dRCA, PCI/DES RPDA, aggrastat x 18 hr   CKD (chronic kidney disease)    COPD (chronic obstructive pulmonary disease) (HCC)    DM2 (diabetes mellitus, type 2) (HCC)    H/O hiatal hernia    Heart attack (HCC) [phone]   had 2 in one day(released plaque during angio). 5 stents total   HTN (hypertension)    Hypercholesterolemia    Impaired fasting glucose    Elevated after steroid injection   Macular degeneration of right eye 2020   receiving injections into eye   Osteoarthritis of hip    injections in both hips   Osteoarthritis, knee    PAD (peripheral artery disease) (HCC)    L fem to below the knee popliteal vein bypass graft   Peri-rectal abscess 12/27/2011   Tobacco abuse    Prior   Past Surgical History:  Procedure Laterality Date   CORONARY ANGIOPLASTY     CORONARY STENT PLACEMENT  1999, 2006, 2016 x 2   Multiple, LAD in 1999, RCA in 2005.  has a total of 4 stents   ENDARTERECTOMY Left 09/21/2018   Procedure: ENDARTERECTOMY CAROTID;  Surgeon: Renford Dills, MD;  Location: [facility] ORS;  Service: Vascular;  Laterality: Left;   FEMORAL-POPLITEAL BYPASS GRAFT  2016   L fem to below the knee popliteal vein bypass graft   INCISE AND DRAIN ABCESS  2007   on scrotum   INCISION AND DRAINAGE PERIRECTAL ABSCESS  12/28/2011   Procedure: IRRIGATION AND DEBRIDEMENT PERIRECTAL ABSCESS;  Surgeon: Robyne Askew, MD;  Location: [facility]  OR;  Service: General;  Laterality: N/A;   Family History  Problem Relation Age of Onset   Heart failure Father        CHF   CAD Mother    Diabetes Mother    Colon cancer Neg Hx    Prostate cancer Neg Hx    Social History   Socioeconomic History   Marital status: Married    Spouse name: wanda   Number of children: 2   Years of education: Not on file   Highest education level: Not on file  Occupational History   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    Comment:  still works full time  Tobacco Use   Smoking status: Former    Current packs/day: 0.00    Average packs/day: 1 pack/day for 40.0 years (40.0 ttl pk-yrs)    Types: Cigarettes    Start date: 10/21/1958    Quit date: 10/21/1998    Years since quitting: 24.3   Smokeless tobacco: Never   Tobacco comments:    Past smoker, states he will smoke maybe 1 cigarette/ month or so still  Vaping Use   Vaping status: Never Used  Substance and Sexual Activity   Alcohol use: Not Currently    Alcohol/week: 0.0 standard drinks of alcohol    Comment: Occasional beer on the weekends   Drug use: No   Sexual activity: Never  Other Topics Concern   Not on file  Social History Narrative   Lives in [place]   Married 50+ years   2 grown daughters, 7 grandchildren, 5 great grandchildren   Designated Party Release Form signed on 01/19/10 appointing Evelene Croon   Social Determinants of Health   Financial Resource Strain: Low Risk  (02/20/2023)   Overall Financial Resource Strain (CARDIA)    Difficulty of Paying Living Expenses: Not hard at all  Food Insecurity: No Food Insecurity (02/20/2023)   Hunger Vital Sign    Worried About Running Out of Food in the Last Year: Never true    Ran Out of Food in the Last Year: Never true  Transportation Needs: No Transportation Needs (02/20/2023)   PRAPARE - Administrator, Civil Service (Medical): No    Lack of Transportation (Non-Medical): No  Physical Activity: Inactive  (02/20/2023)   Exercise Vital Sign    Days of Exercise per Week: 0 days    Minutes of Exercise per Session: 0 min  Stress: No Stress Concern Present (02/20/2023)   [employer] of Occupational Health - Occupational Stress Questionnaire    Feeling of Stress : Not at all  Social Connections: Moderately Integrated (02/20/2023)   Social Connection and Isolation Panel [NHANES]    Frequency of Communication with Friends and Family: More than three times a week    Frequency of Social Gatherings with Friends and Family: More than three times a week    Attends Religious Services: More than 4 times per year    Active Member of [employer] or Organizations: No    Attends Engineer, structural: Never    Marital Status: Married    Tobacco Counseling Counseling given: Not Answered Tobacco comments: Past smoker, states he will smoke maybe 1 cigarette/ month or so still   Clinical Intake:  Pre-visit preparation completed: Yes  Pain : No/denies pain     BMI - recorded: 30.71 Nutritional Status: BMI > 30  Obese Nutritional Risks: None Diabetes: Yes CBG done?: Yes (156 per pt) CBG resulted in Enter/ Edit results?: No Did pt. bring in CBG monitor from home?: No  How often do you need to have someone help you when you read instructions, pamphlets, or other written materials from your doctor or pharmacy?: 1 - Never  Interpreter Needed?: No  Information entered by :: C. LPN   Activities of Daily Living    02/20/2023    2:05 PM  In your present state of health, do you have any difficulty performing the following activities:  Hearing? 1  Comment wears aids  Vision? 1  Comment macular degeneration  Difficulty concentrating or making decisions? 0  Walking or climbing stairs? 0  Dressing or bathing? 0  Doing errands, shopping? 1  Comment Wife Insurance claims handler and eating ? N  Using the Toilet? N  In the past six months, have you accidently leaked urine? N  Do you  have problems with loss of bowel control? N  Managing your Medications? N  Managing your Finances? N  Housekeeping or managing your Housekeeping? N    Patient Care Team: Joaquim Nam, MD as PCP - General Mariah Milling, Tollie Pizza, MD as PCP - Cardiology (Cardiology) Antonieta Iba, MD as Consulting Physician (Cardiology) Kathyrn Sheriff, [facility] (Inactive) as Pharmacist (Pharmacist) Irene Limbo., MD (Ophthalmology)  Indicate any recent Medical Services you may have received from other than [facility] providers in the past year (date may be approximate).     Assessment:   This is a routine wellness examination for Romero.  Hearing/Vision screen Hearing Screening - Comments:: Wears aids Vision Screening - Comments:: Glasses - Has macular degeneration - Dr.Bell UTD on eye exams  Dietary issues and exercise activities discussed:     Goals Addressed             This Visit's Progress    Patient Stated       Maintain health       Depression Screen    02/20/2023    2:03 PM 10/06/2022    2:10 PM 02/17/2022    1:03 PM 02/09/2021    8:21 AM 05/14/2020    9:16 AM 03/31/2017    2:50 PM 12/29/2016    5:29 PM  PHQ 2/9 Scores  PHQ - 2 Score 0 0 0 0 0 0 0  PHQ- 9 Score  0 0 0  0     Fall Risk    02/20/2023    1:59 PM 10/06/2022    2:10 PM 02/17/2022    1:06 PM 02/09/2021    8:21 AM 05/14/2020    9:15 AM  Fall Risk   Falls in the past year? 0 0 0 0 0  Number falls in past yr: 0 0 0 0 0  Injury with Fall? 0 0 0 0   Risk for fall due to : No Fall Risks No Fall Risks No Fall Risks Medication side effect   Follow up Falls prevention discussed;Falls evaluation completed Falls evaluation completed Falls evaluation completed Falls evaluation completed;Falls prevention discussed Falls evaluation completed    MEDICARE RISK AT HOME:  Medicare Risk at Home - 02/20/23 1406     Any stairs in or around the home? Yes    If so, are there any without handrails? No    Home free of loose throw  rugs in walkways, pet beds, electrical cords, etc? Yes    Adequate lighting in your home to reduce risk of falls? Yes    Life alert? No    Use of a cane, walker or w/c? No    Grab bars in the bathroom? Yes    Shower chair or bench in shower? No    Elevated toilet seat or a handicapped toilet? No             TIMED UP AND GO:  Was the test performed?  No    Cognitive Function:    02/09/2021    8:23 AM 03/31/2017    3:07 PM 11/20/2015    3:15 PM  MMSE - Mini Mental State Exam  Not completed: Refused    Orientation to time  5 5  Orientation to Place  5 5  Registration  3  3  Attention/ Calculation  0 0  Recall  2 3  Recall-comments  pt was unable to recall 1 of 3 words   Language- name 2 objects  0 0  Language- repeat  1 1  Language- follow 3 step command  3 3  Language- read & follow direction  0 0  Write a sentence  0 0  Copy design  0 0  Total score  19 20        02/20/2023    2:06 PM  6CIT Screen  What Year? 0 points  What month? 0 points  What time? 0 points  Count back from 20 0 points  Months in reverse 0 points  Repeat phrase 2 points  Total Score 2 points    Immunizations Immunization History  Administered Date(s) Administered   Fluad Quad(high Dose 65+) 05/14/2020, 05/27/2021, 04/07/2022   Influenza,inj,Quad PF,6+ Mos 07/16/2013, 04/21/2014, 06/02/2015, 06/10/2016, 03/31/2017, 08/02/2018   PFIZER(Purple Top)SARS-COV-2 Vaccination 08/17/2019, 09/07/2019, 04/25/2020   Pneumococcal Conjugate-13 10/20/2014   Pneumococcal Polysaccharide-23 12/29/2011   Td 01/19/2010    TDAP status: Due, Education has been provided regarding the importance of this vaccine. Advised may receive this vaccine at local pharmacy or Health Dept. Aware to provide a copy of the vaccination record if obtained from local pharmacy or Health Dept. Verbalized acceptance and understanding.  Flu Vaccine status: Due, Education has been provided regarding the importance of this vaccine.  Advised may receive this vaccine at local pharmacy or Health Dept. Aware to provide a copy of the vaccination record if obtained from local pharmacy or Health Dept. Verbalized acceptance and understanding.  Pneumococcal vaccine status: Up to date  Covid-19 vaccine status: Declined, Education has been provided regarding the importance of this vaccine but patient still declined. Advised may receive this vaccine at local pharmacy or Health Dept.or vaccine clinic. Aware to provide a copy of the vaccination record if obtained from local pharmacy or Health Dept. Verbalized acceptance and understanding.  Qualifies for Shingles Vaccine? Yes   Zostavax completed No   Shingrix Completed?: No.    Education has been provided regarding the importance of this vaccine. Patient has been advised to call insurance company to determine out of pocket expense if they have not yet received this vaccine. Advised may also receive vaccine at local pharmacy or Health Dept. Verbalized acceptance and understanding.  Screening Tests Health Maintenance  Topic Date Due   Zoster Vaccines- Shingrix (1 of 2) Never done   DTaP/Tdap/Td (2 - Tdap) 01/20/2020   OPHTHALMOLOGY EXAM  08/11/2021   INFLUENZA VACCINE  02/09/2023   Diabetic kidney evaluation - eGFR measurement  03/24/2023   FOOT EXAM  04/08/2023   HEMOGLOBIN A1C  04/08/2023   Diabetic kidney evaluation - Urine ACR  10/06/2023   Medicare Annual Wellness (AWV)  02/20/2024   Pneumonia Vaccine 93+ Years old  Completed   Hepatitis C Screening  Completed   HPV VACCINES  Aged Out   Colonoscopy  Discontinued   COVID-19 Vaccine  Discontinued    Health Maintenance  Health Maintenance Due  Topic Date Due   Zoster Vaccines- Shingrix (1 of 2) Never done   DTaP/Tdap/Td (2 - Tdap) 01/20/2020   OPHTHALMOLOGY EXAM  08/11/2021   INFLUENZA VACCINE  02/09/2023    Colorectal cancer screening: No longer required.   Lung Cancer Screening: (Low Dose CT Chest recommended if Age  68-80 years, 20 pack-year currently smoking OR have quit w/in 15years.) does not qualify.   Lung Cancer Screening Referral:  no  Additional Screening:  Hepatitis C Screening: does qualify; Completed 11/20/15  Vision Screening: Recommended annual ophthalmology exams for early detection of glaucoma and other disorders of the eye. Is the patient up to date with their annual eye exam?  Yes  Who is the provider or what is the name of the office in which the patient attends annual eye exams? Dr.Bell If pt is not established with a provider, would they like to be referred to a provider to establish care? Yes .   Dental Screening: Recommended annual dental exams for proper oral hygiene  Diabetic Foot Exam: Diabetic Foot Exam: Completed 04/07/22  Community Resource Referral / Chronic Care Management: CRR required this visit?  No   CCM required this visit?  No     Plan:     I have personally reviewed and noted the following in the patient's chart:   Medical and social history Use of alcohol, tobacco or illicit drugs  Current medications and supplements including opioid prescriptions. Patient is not currently taking opioid prescriptions. Functional ability and status Nutritional status Physical activity Advanced directives List of other physicians Hospitalizations, surgeries, and ER visits in previous 12 months Vitals Screenings to include cognitive, depression, and falls Referrals and appointments  In addition, I have reviewed and discussed with patient certain preventive protocols, quality metrics, and best practice recommendations. A written personalized care plan for preventive services as well as general preventive health recommendations were provided to patient.     Maryan Puls, LPN   0/16/0109   After Visit Summary: (MyChart) Due to this being a telephonic visit, the after visit summary with patients personalized plan was offered to patient via MyChart   Nurse  Notes: Last Diabetic eye exam requested from Dr.Bell.

## 2023-02-20 NOTE — Patient Instructions (Signed)
[name] , Thank you for taking time to come for your Medicare Wellness Visit. I appreciate your ongoing commitment to your health goals. Please review the following plan we discussed and let me know if I can assist you in the future.   Referrals/Orders/Follow-Ups/Clinician Recommendations: Aim for 30 minutes of exercise or brisk walking, 6-8 glasses of water, and 5 servings of fruits and vegetables each day.   This is a list of the screening recommended for you and due dates:  Health Maintenance  Topic Date Due   Zoster (Shingles) Vaccine (1 of 2) Never done   DTaP/Tdap/Td vaccine (2 - Tdap) 01/20/2020   Eye exam for diabetics  08/11/2021   Medicare Annual Wellness Visit  02/18/2023   Flu Shot  02/09/2023   Yearly kidney function blood test for diabetes  03/24/2023   Complete foot exam   04/08/2023   Hemoglobin A1C  04/08/2023   Yearly kidney health urinalysis for diabetes  10/06/2023   Pneumonia Vaccine  Completed   Hepatitis C Screening  Completed   HPV Vaccine  Aged Out   Colon Cancer Screening  Discontinued   COVID-19 Vaccine  Discontinued    Advanced directives: (Declined) Advance directive discussed with you today. Even though you declined this today, please call our office should you change your mind, and we can give you the proper paperwork for you to fill out.  Next Medicare Annual Wellness Visit scheduled for next year: Yes  Preventive Care 78 Years and Older, Male  Preventive care refers to lifestyle choices and visits with your health care provider that can promote health and wellness. What does preventive care include? A yearly physical exam. This is also called an annual well check. Dental exams once or twice a year. Routine eye exams. Ask your health care provider how often you should have your eyes checked. Personal lifestyle choices, including: Daily care of your teeth and gums. Regular physical activity. Eating a healthy diet. Avoiding tobacco and drug  use. Limiting alcohol use. Practicing safe sex. Taking low doses of aspirin every day. Taking vitamin and mineral supplements as recommended by your health care provider. What happens during an annual well check? The services and screenings done by your health care provider during your annual well check will depend on your age, overall health, lifestyle risk factors, and family history of disease. Counseling  Your health care provider may ask you questions about your: Alcohol use. Tobacco use. Drug use. Emotional well-being. Home and relationship well-being. Sexual activity. Eating habits. History of falls. Memory and ability to understand (cognition). Work and work Astronomer. Screening  You may have the following tests or measurements: Height, weight, and BMI. Blood pressure. Lipid and cholesterol levels. These may be checked every 5 years, or more frequently if you are over 55 years old. Skin check. Lung cancer screening. You may have this screening every year starting at age 24 if you have a 30-pack-year history of smoking and currently smoke or have quit within the past 15 years. Fecal occult blood test (FOBT) of the stool. You may have this test every year starting at age 62. Flexible sigmoidoscopy or colonoscopy. You may have a sigmoidoscopy every 5 years or a colonoscopy every 10 years starting at age 73. Prostate cancer screening. Recommendations will vary depending on your family history and other risks. Hepatitis C blood test. Hepatitis B blood test. Sexually transmitted disease (STD) testing. Diabetes screening. This is done by checking your blood sugar (glucose) after you have not  eaten for a while (fasting). You may have this done every 1-3 years. Abdominal aortic aneurysm (AAA) screening. You may need this if you are a current or former smoker. Osteoporosis. You may be screened starting at age 26 if you are at high risk. Talk with your health care provider about  your test results, treatment options, and if necessary, the need for more tests. Vaccines  Your health care provider may recommend certain vaccines, such as: Influenza vaccine. This is recommended every year. Tetanus, diphtheria, and acellular pertussis (Tdap, Td) vaccine. You may need a Td booster every 10 years. Zoster vaccine. You may need this after age 48. Pneumococcal 13-valent conjugate (PCV13) vaccine. One dose is recommended after age 36. Pneumococcal polysaccharide (PPSV23) vaccine. One dose is recommended after age 64. Talk to your health care provider about which screenings and vaccines you need and how often you need them. This information is not intended to replace advice given to you by your health care provider. Make sure you discuss any questions you have with your health care provider. Document Released: 07/24/2015 Document Revised: 03/16/2016 Document Reviewed: 04/28/2015 Elsevier Interactive Patient Education  2017 [employer].  Fall Prevention in the Home Falls can cause injuries. They can happen to people of all ages. There are many things you can do to make your home safe and to help prevent falls. What can I do on the outside of my home? Regularly fix the edges of walkways and driveways and fix any cracks. Remove anything that might make you trip as you walk through a door, such as a raised step or threshold. Trim any bushes or trees on the path to your home. Use bright outdoor lighting. Clear any walking paths of anything that might make someone trip, such as rocks or tools. Regularly check to see if handrails are loose or broken. Make sure that both sides of any steps have handrails. Any raised decks and porches should have guardrails on the edges. Have any leaves, snow, or ice cleared regularly. Use sand or salt on walking paths during winter. Clean up any spills in your garage right away. This includes oil or grease spills. What can I do in the bathroom? Use  night lights. Install grab bars by the toilet and in the tub and shower. Do not use towel bars as grab bars. Use non-skid mats or decals in the tub or shower. If you need to sit down in the shower, use a plastic, non-slip stool. Keep the floor dry. Clean up any water that spills on the floor as soon as it happens. Remove soap buildup in the tub or shower regularly. Attach bath mats securely with double-sided non-slip rug tape. Do not have throw rugs and other things on the floor that can make you trip. What can I do in the bedroom? Use night lights. Make sure that you have a light by your bed that is easy to reach. Do not use any sheets or blankets that are too big for your bed. They should not hang down onto the floor. Have a firm chair that has side arms. You can use this for support while you get dressed. Do not have throw rugs and other things on the floor that can make you trip. What can I do in the kitchen? Clean up any spills right away. Avoid walking on wet floors. Keep items that you use a lot in easy-to-reach places. If you need to reach something above you, use a strong step stool that  has a grab bar. Keep electrical cords out of the way. Do not use floor polish or wax that makes floors slippery. If you must use wax, use non-skid floor wax. Do not have throw rugs and other things on the floor that can make you trip. What can I do with my stairs? Do not leave any items on the stairs. Make sure that there are handrails on both sides of the stairs and use them. Fix handrails that are broken or loose. Make sure that handrails are as long as the stairways. Check any carpeting to make sure that it is firmly attached to the stairs. Fix any carpet that is loose or worn. Avoid having throw rugs at the top or bottom of the stairs. If you do have throw rugs, attach them to the floor with carpet tape. Make sure that you have a light switch at the top of the stairs and the bottom of the  stairs. If you do not have them, ask someone to add them for you. What else can I do to help prevent falls? Wear shoes that: Do not have high heels. Have rubber bottoms. Are comfortable and fit you well. Are closed at the toe. Do not wear sandals. If you use a stepladder: Make sure that it is fully opened. Do not climb a closed stepladder. Make sure that both sides of the stepladder are locked into place. Ask someone to hold it for you, if possible. Clearly mark and make sure that you can see: Any grab bars or handrails. First and last steps. Where the edge of each step is. Use tools that help you move around (mobility aids) if they are needed. These include: Canes. Walkers. Scooters. Crutches. Turn on the lights when you go into a dark area. Replace any light bulbs as soon as they burn out. Set up your furniture so you have a clear path. Avoid moving your furniture around. If any of your floors are uneven, fix them. If there are any pets around you, be aware of where they are. Review your medicines with your doctor. Some medicines can make you feel dizzy. This can increase your chance of falling. Ask your doctor what other things that you can do to help prevent falls. This information is not intended to replace advice given to you by your health care provider. Make sure you discuss any questions you have with your health care provider. Document Released: 04/23/2009 Document Revised: 12/03/2015 Document Reviewed: 08/01/2014 Elsevier Interactive Patient Education  2017 [employer].

## 2023-02-22 ENCOUNTER — Other Ambulatory Visit: Payer: Self-pay | Admitting: Cardiovascular Disease

## 2023-02-26 ENCOUNTER — Encounter (INDEPENDENT_AMBULATORY_CARE_PROVIDER_SITE_OTHER): Payer: Self-pay | Admitting: Vascular Surgery

## 2023-03-01 DIAGNOSIS — H541142 Blindness right eye category 4, low vision left eye category 2: Secondary | ICD-10-CM | POA: Diagnosis not present

## 2023-03-01 DIAGNOSIS — H53413 Scotoma involving central area, bilateral: Secondary | ICD-10-CM | POA: Diagnosis not present

## 2023-03-07 DIAGNOSIS — H53413 Scotoma involving central area, bilateral: Secondary | ICD-10-CM | POA: Diagnosis not present

## 2023-03-07 DIAGNOSIS — H541142 Blindness right eye category 4, low vision left eye category 2: Secondary | ICD-10-CM | POA: Diagnosis not present

## 2023-03-14 DIAGNOSIS — H541142 Blindness right eye category 4, low vision left eye category 2: Secondary | ICD-10-CM | POA: Diagnosis not present

## 2023-03-14 DIAGNOSIS — H53413 Scotoma involving central area, bilateral: Secondary | ICD-10-CM | POA: Diagnosis not present

## 2023-03-21 DIAGNOSIS — H53413 Scotoma involving central area, bilateral: Secondary | ICD-10-CM | POA: Diagnosis not present

## 2023-03-21 DIAGNOSIS — H541142 Blindness right eye category 4, low vision left eye category 2: Secondary | ICD-10-CM | POA: Diagnosis not present

## 2023-03-28 DIAGNOSIS — H53413 Scotoma involving central area, bilateral: Secondary | ICD-10-CM | POA: Diagnosis not present

## 2023-03-28 DIAGNOSIS — H541142 Blindness right eye category 4, low vision left eye category 2: Secondary | ICD-10-CM | POA: Diagnosis not present

## 2023-04-04 DIAGNOSIS — H541142 Blindness right eye category 4, low vision left eye category 2: Secondary | ICD-10-CM | POA: Diagnosis not present

## 2023-04-04 DIAGNOSIS — H53413 Scotoma involving central area, bilateral: Secondary | ICD-10-CM | POA: Diagnosis not present

## 2023-04-11 DIAGNOSIS — H353213 Exudative age-related macular degeneration, right eye, with inactive scar: Secondary | ICD-10-CM | POA: Diagnosis not present

## 2023-04-11 DIAGNOSIS — H348312 Tributary (branch) retinal vein occlusion, right eye, stable: Secondary | ICD-10-CM | POA: Diagnosis not present

## 2023-04-11 DIAGNOSIS — H31011 Macula scars of posterior pole (postinflammatory) (post-traumatic), right eye: Secondary | ICD-10-CM | POA: Diagnosis not present

## 2023-04-11 DIAGNOSIS — H43813 Vitreous degeneration, bilateral: Secondary | ICD-10-CM | POA: Diagnosis not present

## 2023-04-11 DIAGNOSIS — H353221 Exudative age-related macular degeneration, left eye, with active choroidal neovascularization: Secondary | ICD-10-CM | POA: Diagnosis not present

## 2023-04-12 ENCOUNTER — Telehealth: Payer: Self-pay | Admitting: Family Medicine

## 2023-04-12 DIAGNOSIS — E1169 Type 2 diabetes mellitus with other specified complication: Secondary | ICD-10-CM

## 2023-04-12 NOTE — Telephone Encounter (Signed)
Done. Thanks.

## 2023-04-12 NOTE — Addendum Note (Signed)
Addended by: Joaquim Nam on: 04/12/2023 02:02 PM   Modules accepted: Orders

## 2023-04-12 NOTE — Telephone Encounter (Signed)
Patient called in to schedule labs before his appointment next week. He is scheduled for tomorrow morning. Just an FYI because no lab orders are in. Thank you!

## 2023-04-13 ENCOUNTER — Other Ambulatory Visit: Payer: Medicare Other

## 2023-04-13 DIAGNOSIS — E1169 Type 2 diabetes mellitus with other specified complication: Secondary | ICD-10-CM

## 2023-04-13 DIAGNOSIS — E785 Hyperlipidemia, unspecified: Secondary | ICD-10-CM

## 2023-04-13 LAB — LIPID PANEL
Cholesterol: 114 mg/dL (ref 0–200)
HDL: 26.1 mg/dL — ABNORMAL LOW (ref >39.00)
LDL Cholesterol: 20 mg/dL (ref 0–99)
NonHDL: 87.76
Total CHOL/HDL Ratio: 4
Triglycerides: 341 mg/dL — ABNORMAL HIGH (ref 0.0–149.0)
VLDL: 68.2 mg/dL — ABNORMAL HIGH (ref 0.0–40.0)

## 2023-04-13 LAB — COMPREHENSIVE METABOLIC PANEL
ALT: 17 U/L (ref 0–53)
AST: 16 U/L (ref 0–37)
Albumin: 4 g/dL (ref 3.5–5.2)
Alkaline Phosphatase: 40 U/L (ref 39–117)
BUN: 17 mg/dL (ref 6–23)
CO2: 29 meq/L (ref 19–32)
Calcium: 9.3 mg/dL (ref 8.4–10.5)
Chloride: 103 meq/L (ref 96–112)
Creatinine, Ser: 1.07 mg/dL (ref 0.40–1.50)
GFR: 66.71 mL/min (ref >60.00)
Glucose, Bld: 164 mg/dL — ABNORMAL HIGH (ref 70–99)
Potassium: 3.8 meq/L (ref 3.5–5.1)
Sodium: 139 meq/L (ref 135–145)
Total Bilirubin: 0.7 mg/dL (ref 0.2–1.2)
Total Protein: 6.4 g/dL (ref 6.0–8.3)

## 2023-04-13 LAB — CBC WITH DIFFERENTIAL/PLATELET
Basophils Absolute: 0 10*3/uL (ref 0.0–0.1)
Basophils Relative: 0.6 % (ref 0.0–3.0)
Eosinophils Absolute: 0.3 10*3/uL (ref 0.0–0.7)
Eosinophils Relative: 4.2 % (ref 0.0–5.0)
HCT: 46 % (ref 39.0–52.0)
Hemoglobin: 15.1 g/dL (ref 13.0–17.0)
Lymphocytes Relative: 26 % (ref 12.0–46.0)
Lymphs Abs: 1.9 10*3/uL (ref 0.7–4.0)
MCHC: 32.7 g/dL (ref 30.0–36.0)
MCV: 93.1 fL (ref 78.0–100.0)
Monocytes Absolute: 0.7 10*3/uL (ref 0.1–1.0)
Monocytes Relative: 9.8 % (ref 3.0–12.0)
Neutro Abs: 4.2 10*3/uL (ref 1.4–7.7)
Neutrophils Relative %: 59.4 % (ref 43.0–77.0)
Platelets: 192 10*3/uL (ref 150.0–400.0)
RBC: 4.94 Mil/uL (ref 4.22–5.81)
RDW: 14.1 % (ref 11.5–15.5)
WBC: 7.1 10*3/uL (ref 4.0–10.5)

## 2023-04-13 LAB — HEMOGLOBIN A1C: Hgb A1c MFr Bld: 8.7 % — ABNORMAL HIGH (ref 4.6–6.5)

## 2023-04-13 LAB — MICROALBUMIN / CREATININE URINE RATIO
Creatinine,U: 70.8 mg/dL
Microalb Creat Ratio: 78.1 mg/g — ABNORMAL HIGH (ref 0.0–30.0)
Microalb, Ur: 55.3 mg/dL — ABNORMAL HIGH (ref 0.0–1.9)

## 2023-04-16 LAB — FRUCTOSAMINE: Fructosamine: 288 umol/L — ABNORMAL HIGH (ref 205–285)

## 2023-04-18 ENCOUNTER — Ambulatory Visit (INDEPENDENT_AMBULATORY_CARE_PROVIDER_SITE_OTHER): Payer: Medicare Other | Admitting: Family Medicine

## 2023-04-18 ENCOUNTER — Encounter: Payer: Self-pay | Admitting: Family Medicine

## 2023-04-18 VITALS — BP 136/72 | HR 56 | Temp 98.2°F | Ht 68.0 in | Wt 205.6 lb

## 2023-04-18 DIAGNOSIS — Z7189 Other specified counseling: Secondary | ICD-10-CM

## 2023-04-18 DIAGNOSIS — Z794 Long term (current) use of insulin: Secondary | ICD-10-CM

## 2023-04-18 DIAGNOSIS — Z23 Encounter for immunization: Secondary | ICD-10-CM | POA: Diagnosis not present

## 2023-04-18 DIAGNOSIS — Z7984 Long term (current) use of oral hypoglycemic drugs: Secondary | ICD-10-CM | POA: Diagnosis not present

## 2023-04-18 DIAGNOSIS — E782 Mixed hyperlipidemia: Secondary | ICD-10-CM

## 2023-04-18 DIAGNOSIS — J432 Centrilobular emphysema: Secondary | ICD-10-CM

## 2023-04-18 DIAGNOSIS — I10 Essential (primary) hypertension: Secondary | ICD-10-CM

## 2023-04-18 DIAGNOSIS — E1169 Type 2 diabetes mellitus with other specified complication: Secondary | ICD-10-CM

## 2023-04-18 DIAGNOSIS — E785 Hyperlipidemia, unspecified: Secondary | ICD-10-CM

## 2023-04-18 DIAGNOSIS — Z0000 Encounter for general adult medical examination without abnormal findings: Secondary | ICD-10-CM

## 2023-04-18 DIAGNOSIS — L989 Disorder of the skin and subcutaneous tissue, unspecified: Secondary | ICD-10-CM

## 2023-04-18 MED ORDER — LANTUS SOLOSTAR 100 UNIT/ML ~~LOC~~ SOPN: PEN_INJECTOR | SUBCUTANEOUS | Status: DC

## 2023-04-18 MED ORDER — BUDESONIDE-FORMOTEROL FUMARATE 160-4.5 MCG/ACT IN AERO: 2.0000 | INHALATION_SPRAY | Freq: Two times a day (BID) | RESPIRATORY_TRACT | 3 refills | Status: AC

## 2023-04-18 NOTE — Patient Instructions (Addendum)
Flu shot today.  Labs ahead of a visit in about 6 months.  Take care.  Glad to see you.

## 2023-04-18 NOTE — Progress Notes (Unsigned)
Colonoscopy d/w pt, would defer age >29.  No bloody or black stools.   Tetanus and shingles may be cheaper at pharmacy, d/w pt.   Flu today.   PNA up to date.   RSV and covid vaccines d/w pt.  Prostate cancer screening and PSA options (with potential risks and benefits of testing vs not testing) were discussed along with recent recs/guidelines.  He declined testing PSA at this point. Advance directive- wife designated if patient were incapacitated.   Diabetes:  Using medications without difficulties: yes Hypoglycemic episodes:no Hyperglycemic episodes:no Feet problems:no Blood Sugars averaging:120-150 eye exam within last year: yes Fructosamine converts to A1c of 6.5   H/o COPD.  Not SOB.  Symbicort use d/w pt.  Rare use SABA.  Discussed rinsing after using symbicort.    Hypertension:    Using medication without problems or lightheadedness: yes Chest pain with exertion:no Edema:some L ankle edema since prior surgery.  Not changed.  Short of breath: no  Elevated Cholesterol: Using medications without problems: yes Muscle aches: no Diet compliance: d/w pt.   Exercise: d/w pt.   Labs d/w pt.  PMH and SH reviewed.   Vital signs, Meds and allergies reviewed.  ROS: Per HPI unless specifically indicated in ROS section   GEN: nad, alert and oriented HEENT: mucous membranes moist NECK: supple w/o LA CV: rrr.  no murmur PULM: ctab, no inc wob ABD: soft, +bs EXT: no edema SKIN: no acute rash but mult SKs noted on the face- d/w pt about derm eval  Diabetic foot exam: Normal inspection No skin breakdown No calluses  Normal DP pulses Normal sensation to light tough and monofilament Nails normal

## 2023-04-19 NOTE — Assessment & Plan Note (Signed)
Continue amlodipine lisinopril metoprolol.  Labs discussed with patient.  Continue work on diet and exercise.

## 2023-04-19 NOTE — Assessment & Plan Note (Signed)
Colonoscopy d/w pt, would defer age >75.  No bloody or black stools.   Tetanus and shingles may be cheaper at pharmacy, d/w pt.   Flu today.   PNA up to date.   RSV and covid vaccines d/w pt.  Prostate cancer screening and PSA options (with potential risks and benefits of testing vs not testing) were discussed along with recent recs/guidelines.  He declined testing PSA at this point. Advance directive- wife designated if patient were incapacitated.

## 2023-04-19 NOTE — Assessment & Plan Note (Signed)
Fructosamine converts to A1c of 6.5  I suspect his A1c is artificially elevated.  Continue insulin and metformin.  Recheck periodically.

## 2023-04-19 NOTE — Assessment & Plan Note (Signed)
Not SOB.  Symbicort use d/w pt.  Rare use SABA.  Discussed rinsing after using symbicort.   Lungs are clear and okay for outpatient follow-up.  Continue as is.

## 2023-04-19 NOTE — Assessment & Plan Note (Signed)
Advance directive- wife designated if patient were incapacitated.  

## 2023-04-19 NOTE — Assessment & Plan Note (Signed)
Continue atorvastatin and fenofibrate.  Labs discussed with patient.  Continue work on diet and exercise.

## 2023-04-19 NOTE — Assessment & Plan Note (Signed)
Multiple seborrheic keratoses noted on the face and I would like dermatology input.  Refer.

## 2023-04-20 DIAGNOSIS — H53413 Scotoma involving central area, bilateral: Secondary | ICD-10-CM | POA: Diagnosis not present

## 2023-04-20 DIAGNOSIS — H541142 Blindness right eye category 4, low vision left eye category 2: Secondary | ICD-10-CM | POA: Diagnosis not present

## 2023-05-03 DIAGNOSIS — H541142 Blindness right eye category 4, low vision left eye category 2: Secondary | ICD-10-CM | POA: Diagnosis not present

## 2023-05-03 DIAGNOSIS — H53413 Scotoma involving central area, bilateral: Secondary | ICD-10-CM | POA: Diagnosis not present

## 2023-05-22 ENCOUNTER — Other Ambulatory Visit: Payer: Self-pay | Admitting: Cardiovascular Disease

## 2023-05-22 NOTE — Telephone Encounter (Signed)
Unable to leave message, no voicemail set up.

## 2023-05-22 NOTE — Telephone Encounter (Signed)
Hi,  Will you please outreach patient to schedule past due 12 month follow up.   Thank you,  Ferne Coe

## 2023-05-23 NOTE — Telephone Encounter (Signed)
Please contact pt for future appointment. Pt due for 12 month f/u. 

## 2023-05-23 NOTE — Telephone Encounter (Signed)
last visit 04/29/22 with plan to f/u in  12 months.    next visit:  07/31/23

## 2023-05-23 NOTE — Telephone Encounter (Signed)
Pt scheduled on 1/20

## 2023-05-23 NOTE — Telephone Encounter (Signed)
last visit 04/29/22 with plan to f/u in  12 months.    next visit:  none/active recall

## 2023-05-24 ENCOUNTER — Other Ambulatory Visit: Payer: Self-pay

## 2023-05-24 MED ORDER — LISINOPRIL 40 MG PO TABS: 40.0000 mg | ORAL_TABLET | Freq: Every day | ORAL | 0 refills | Status: DC

## 2023-05-31 ENCOUNTER — Other Ambulatory Visit: Payer: Self-pay | Admitting: Cardiovascular Disease

## 2023-06-02 ENCOUNTER — Telehealth: Payer: Self-pay | Admitting: Family Medicine

## 2023-06-02 NOTE — Telephone Encounter (Signed)
Type of forms received: [employer] patient assistance   Routed to: [employer] received by : Audree Camel   Individual made aware of 3-5 business day turn around (Y/N): Y   Form completed and patient made aware of charges(Y/N): Y    Faxed to : pt requested ppw to be faxed to # on ppw, [phone]  Form location: duncan's folder

## 2023-06-02 NOTE — Telephone Encounter (Signed)
Paperwork is in [employer]

## 2023-06-04 NOTE — Telephone Encounter (Signed)
I will work on [employer].  Thanks.

## 2023-06-05 DIAGNOSIS — H353131 Nonexudative age-related macular degeneration, bilateral, early dry stage: Secondary | ICD-10-CM | POA: Diagnosis not present

## 2023-06-11 DIAGNOSIS — E119 Type 2 diabetes mellitus without complications: Secondary | ICD-10-CM | POA: Diagnosis not present

## 2023-06-13 DIAGNOSIS — H353221 Exudative age-related macular degeneration, left eye, with active choroidal neovascularization: Secondary | ICD-10-CM | POA: Diagnosis not present

## 2023-06-13 DIAGNOSIS — H348312 Tributary (branch) retinal vein occlusion, right eye, stable: Secondary | ICD-10-CM | POA: Diagnosis not present

## 2023-06-13 DIAGNOSIS — H1132 Conjunctival hemorrhage, left eye: Secondary | ICD-10-CM | POA: Diagnosis not present

## 2023-06-13 DIAGNOSIS — H353213 Exudative age-related macular degeneration, right eye, with inactive scar: Secondary | ICD-10-CM | POA: Diagnosis not present

## 2023-06-13 DIAGNOSIS — H31011 Macula scars of posterior pole (postinflammatory) (post-traumatic), right eye: Secondary | ICD-10-CM | POA: Diagnosis not present

## 2023-06-13 DIAGNOSIS — H43813 Vitreous degeneration, bilateral: Secondary | ICD-10-CM | POA: Diagnosis not present

## 2023-06-27 ENCOUNTER — Other Ambulatory Visit: Payer: Self-pay | Admitting: Cardiovascular Disease

## 2023-06-27 ENCOUNTER — Telehealth: Payer: Self-pay

## 2023-06-27 NOTE — Telephone Encounter (Signed)
Called patient to advised that his 6 boxes of lantus are available for pickup at the office. Patient will pick up.  6 boxes of Lantus Solostar located in middle refrigerator.

## 2023-07-10 ENCOUNTER — Other Ambulatory Visit: Payer: Self-pay | Admitting: Cardiovascular Disease

## 2023-07-28 NOTE — Progress Notes (Unsigned)
Date:  07/31/2023   ID:  [name], DOB [birth year], MRN [id number]  Patient Location:  [address] [place] [address]   Provider location:   [facility], [place] office  PCP:  Joaquim Nam, MD  Cardiologist:  Fonnie Mu  Chief Complaint  Patient presents with   12 month follow up     "Doing well."      History of Present Illness:    [name] is a 78 y.o. male past medical history of non-STEMI 10/24/2014  coronary artery disease, prior stent to the LAD and RCA, 2016 anginal equivalent, scapular pain between his shoulder blades. diabetes,  PAD, left femoropopliteal bypass with repair of aneurysm,  carotid disease on the left, s/p CEA  3/20 long history of smoking for 50 years,  quit (rare smoking) Prior hx of renal failure 2020, normal normalized CR 1.2 who presents for routine follow-up of his coronary artery disease.   Last seen by myself in clinic October 2023  Followed by vascular, dr. Lorretta Harp, follows lower extremities Carotid ultrasound August 2024, with 40 to 59% stenosis on left, less than 39% disease on right  ABIs within normal range No AAA  Continues to have vision problerms, macular degeneration Can not drive Drives to [facility] for treatment  Problems with his teeth, difficulty chewing  Blood pressure typically runs in the 130 systolic range Reports he checks his sugars at home, sometimes well-controlled other times seems to jump for no reason  Chronic mild shortness of breath on exertion, uses inhaler  Lab work reviewed A1c 8.7 every time, on insulin Total cholesterol 114, LDL 20  EKG personally reviewed by myself on todays visit EKG Interpretation Date/Time:  Monday July 31 2023 08:40:05 EST Ventricular Rate:  57 PR Interval:  144 QRS Duration:  80 QT Interval:  420 QTC Calculation: 408 R Axis:   6  Text Interpretation: Sinus bradycardia Possible Inferior infarct , age undetermined T  wave abnormality, consider lateral ischemia When compared with ECG of 06-Jan-2018 18:22, Premature atrial complexes are no longer Present Borderline criteria for Inferior infarct are now Present T wave inversion now evident in Lateral leads Confirmed by Julien Nordmann [phone]) on 07/31/2023 8:45:46 AM   Other past medical history   presented to the hospital October 24 2014 with pain similar to his previous anginal equivalent, scapular pain between his shoulder blades. He had cardiac catheterization April 18 that showed chronically occluded mid LAD, severe disease of the distal RCA as well as PDA branch. He had DES to the distal RCA. In the holding area, he developed chest discomfort and was taken back to the cardiac catheterization lab that showed occluded PDA lesion which was stented.   Past Medical History:  Diagnosis Date   CAD (coronary artery disease)    a. 1999: PCI-->LAD 2/2 MI; b. 2005: inf MI s/p PCI/DES x 4 to RCA; c. Myoview in 12/2005: EF 61%, no evidence for ischemia; d. cath 10/2014: occluded mLAD w/ L to L and L to R collats, dRCA 95% s/p PCI/DES 0%, mPDA 70%, LCx mild to mod irregs, procedure complicated by inf ST ele in recovery, repeat cath showed acute dRCA stent thrombosis o/w occluded mRPDA, PTCA dRCA, PCI/DES RPDA, aggrastat x 18 hr   CKD (chronic kidney disease)    COPD (chronic obstructive pulmonary disease) (HCC)    DM2 (diabetes mellitus, type 2) (HCC)    H/O hiatal hernia  Heart attack (HCC) [phone]   had 2 in one day(released plaque during angio). 5 stents total   HTN (hypertension)    Hypercholesterolemia    Impaired fasting glucose    Elevated after steroid injection   Macular degeneration of right eye 2020   receiving injections into eye   Osteoarthritis of hip    injections in both hips   Osteoarthritis, knee    PAD (peripheral artery disease) (HCC)    L fem to below the knee popliteal vein bypass graft   Peri-rectal abscess 12/27/2011   Tobacco abuse     Prior   Past Surgical History:  Procedure Laterality Date   CORONARY ANGIOPLASTY     CORONARY STENT PLACEMENT  1999, 2006, 2016 x 2   Multiple, LAD in 1999, RCA in 2005.  has a total of 4 stents   ENDARTERECTOMY Left 09/21/2018   Procedure: ENDARTERECTOMY CAROTID;  Surgeon: Renford Dills, MD;  Location: [facility] ORS;  Service: Vascular;  Laterality: Left;   FEMORAL-POPLITEAL BYPASS GRAFT  2016   L fem to below the knee popliteal vein bypass graft   INCISE AND DRAIN ABCESS  2007   on scrotum   INCISION AND DRAINAGE PERIRECTAL ABSCESS  12/28/2011   Procedure: IRRIGATION AND DEBRIDEMENT PERIRECTAL ABSCESS;  Surgeon: Caleen Essex III, MD;  Location: [facility] OR;  Service: General;  Laterality: N/A;    Allergies:   Augmentin [amoxicillin-pot clavulanate], Morphine and codeine, and Metformin and related   Social History   Tobacco Use   Smoking status: Former    Current packs/day: 0.00    Average packs/day: 1 pack/day for 40.0 years (40.0 ttl pk-yrs)    Types: Cigarettes    Start date: 10/21/1958    Quit date: 10/21/1998    Years since quitting: 24.7   Smokeless tobacco: Never   Tobacco comments:    Past smoker, states he will smoke maybe 1 cigarette/ month or so still  Vaping Use   Vaping status: Never Used  Substance Use Topics   Alcohol use: Not Currently    Alcohol/week: 0.0 standard drinks of alcohol    Comment: Occasional beer on the weekends   Drug use: No     Current Outpatient Medications on File Prior to Visit  Medication Sig Dispense Refill   acetaminophen (TYLENOL) 325 MG tablet Take 1-2 tablets (325-650 mg total) by mouth every 4 (four) hours as needed for mild pain (or temp >/= 101 F). 100 tablet 0   albuterol (PROAIR HFA) 108 (90 BASE) MCG/ACT inhaler Inhale 2 puffs into the lungs 3 (three) times daily as needed (cough). 18 g 2   amLODipine (NORVASC) 5 MG tablet TAKE 1 TABLET(5 MG) BY MOUTH EVERY NIGHT. FOLLOW UP. THANK YOU 90 tablet 0   aspirin EC 81 MG tablet Take 81  mg by mouth daily. Swallow whole.     atorvastatin (LIPITOR) 40 MG tablet TAKE 1 TABLET(40 MG) BY MOUTH DAILY 90 tablet 0   budesonide-formoterol (SYMBICORT) 160-4.5 MCG/ACT inhaler Inhale 2 puffs into the lungs 2 (two) times daily. 3 each 3   clopidogrel (PLAVIX) 75 MG tablet TAKE 1 TABLET(75 MG) BY MOUTH DAILY 90 tablet 0   famotidine (PEPCID) 20 MG tablet Take 1 tablet (20 mg total) by mouth 2 (two) times daily. 8 tablet 0   fenofibrate (TRICOR) 145 MG tablet TAKE 1 TABLET(145 MG) BY MOUTH EVERY DAY 90 tablet 0   glucosamine-chondroitin 500-400 MG tablet Take 1 tablet by mouth 2 (two) times daily.  insulin glargine (LANTUS SOLOSTAR) 100 UNIT/ML Solostar Pen ADMINISTER 100 UNITS UNDER THE SKIN DAILY (2 split doses with 50 units each)     lisinopril (ZESTRIL) 40 MG tablet Take 1 tablet (40 mg total) by mouth daily. 90 tablet 0   meclizine (ANTIVERT) 25 MG tablet Take 25 mg by mouth every 6 (six) hours as needed. For dizziness/vertigo     metFORMIN (GLUCOPHAGE) 500 MG tablet TAKE 1 TABLET(500 MG) BY MOUTH TWICE DAILY WITH A MEAL 180 tablet 3   metoprolol tartrate (LOPRESSOR) 25 MG tablet TAKE 1/2 TABLET(12.5 MG) BY MOUTH TWICE DAILY 90 tablet 0   Multiple Vitamins-Minerals (PRESERVISION AREDS 2 PO) Take 1 tablet by mouth 2 (two) times daily.     nitroGLYCERIN (NITROSTAT) 0.4 MG SL tablet Place 1 tablet (0.4 mg total) under the tongue every 5 (five) minutes as needed. For chest pain 25 tablet 2   blood glucose meter kit and supplies KIT 1 each by Does not apply route daily as needed. Dispense based on patient and insurance preference. Use up to four times daily as directed. (FOR ICD-9 250.00, 250.01). (Patient not taking: Reported on 07/31/2023) 1 each 0   EPINEPHrine 0.3 mg/0.3 mL IJ SOAJ injection SMARTSIG:0.3 Milliliter(s) IM Once PRN (Patient not taking: Reported on 07/31/2023)     glucose blood (ONETOUCH ULTRA) test strip USE TO CHECK BLOOD SUGAR TWICE DAILY (Patient not taking: Reported on  07/31/2023) 100 strip 4   Insulin Pen Needle (B-D UF III MINI PEN NEEDLES) 31G X 5 MM MISC USE AS DIRECTED TO INJECT INSULIN EVERY DAY (Patient not taking: Reported on 07/31/2023) 100 each 3   Lancets (ONETOUCH ULTRASOFT) lancets Use as instructed to check sugar, dx E11.9. (Patient not taking: Reported on 07/31/2023) 100 each 12   No current facility-administered medications on file prior to visit.     Family Hx: The patient's family history includes CAD in his mother; Diabetes in his mother; Heart failure in his father. There is no history of Colon cancer or Prostate cancer.  ROS:   Please see the history of present illness.    Review of Systems  Constitutional: Negative.   HENT: Negative.    Respiratory: Negative.    Cardiovascular: Negative.   Gastrointestinal: Negative.   Musculoskeletal:  Positive for joint pain.  Neurological: Negative.   Psychiatric/Behavioral: Negative.    All other systems reviewed and are negative.    Labs/Other Tests and Data Reviewed:    Recent Labs: 04/13/2023: ALT 17; BUN 17; Creatinine, Ser 1.07; Hemoglobin 15.1; Platelets 192.0; Potassium 3.8; Sodium 139   Recent Lipid Panel Lab Results  Component Value Date/Time   CHOL 114 04/13/2023 08:14 AM   CHOL 135 10/25/2014 03:02 AM   TRIG 341.0 (H) 04/13/2023 08:14 AM   TRIG 434 (H) 10/25/2014 03:02 AM   HDL 26.10 (L) 04/13/2023 08:14 AM   HDL 21 (L) 10/25/2014 03:02 AM   CHOLHDL 4 04/13/2023 08:14 AM   LDLCALC 20 04/13/2023 08:14 AM   LDLCALC SEE COMMENT 10/25/2014 03:02 AM   LDLDIRECT 39.0 02/10/2021 07:40 AM    Wt Readings from Last 3 Encounters:  07/31/23 208 lb 4 oz (94.5 kg)  04/18/23 205 lb 9.6 oz (93.3 kg)  02/20/23 202 lb (91.6 kg)     Exam:    BP (!) 144/72 (BP Location: Left Arm, Patient Position: Sitting, Cuff Size: Normal)   Pulse (!) 57   Ht 5\' 8"  (1.727 m)   Wt 208 lb 4 oz (94.5 kg)  SpO2 95%   BMI 31.66 kg/m  Constitutional:  oriented to person, place, and time. No  distress.  HENT:  Head: Grossly normal Eyes:  no discharge. No scleral icterus.  Neck: No JVD, no carotid bruits  Cardiovascular: Regular rate and rhythm, no murmurs appreciated Pulmonary/Chest: Clear to auscultation bilaterally, no wheezes or rails Abdominal: Soft.  no distension.  no tenderness.  Musculoskeletal: Normal range of motion Neurological:  normal muscle tone. Coordination normal. No atrophy Skin: Skin warm and dry Psychiatric: normal affect, pleasant  ASSESSMENT & PLAN:    Problem List Items Addressed This Visit       Cardiology Problems   Essential hypertension   Relevant Orders   EKG 12-Lead (Completed)   Type 2 diabetes mellitus with hyperlipidemia (HCC)   CAD (coronary artery disease), native coronary artery - Primary   Relevant Orders   EKG 12-Lead (Completed)   Carotid artery disease (HCC)   Relevant Orders   EKG 12-Lead (Completed)   AAA (abdominal aortic aneurysm) without rupture (HCC)   Popliteal artery aneurysm (HCC)   Relevant Orders   EKG 12-Lead (Completed)   Other Visit Diagnoses       PAD (peripheral artery disease) (HCC)       Relevant Orders   EKG 12-Lead (Completed)     Hyperlipidemia LDL goal <70         DM (diabetes mellitus), type 2 with peripheral vascular complications (HCC)          PAD Followed by vascular Carotid endarterectomy March 2020,  Carotid ultrasound stable results as above, no AAA, normal ABIs  Diabetes type 2 with complications Hemoglobin A1c typically 8  Diet discussed Recommended calorie restriction, walking program  CAD with stable angina Denies anginal symptoms, scapular pain No further workup at this time. Continue current medication regimen.  Hyperlipidemia Cholesterol at goal for approximately 10 years, no medication changes made Continue Lipitor 40  Hypertension Blood pressure typically running in the 130 systolic range, high end of his range today Recommend periodic checking at home  Acute  renal failure Elevated following carotid endarterectomy March 2020,  Stable renal function    Signed, Julien Nordmann, MD  [facility] [address], [place], [place] [address]

## 2023-07-31 ENCOUNTER — Ambulatory Visit: Payer: Medicare Other | Attending: Cardiovascular Disease | Admitting: Cardiovascular Disease

## 2023-07-31 ENCOUNTER — Encounter: Payer: Self-pay | Admitting: Cardiovascular Disease

## 2023-07-31 VITALS — BP 144/72 | HR 57 | Ht 68.0 in | Wt 208.2 lb

## 2023-07-31 DIAGNOSIS — I739 Peripheral vascular disease, unspecified: Secondary | ICD-10-CM | POA: Diagnosis not present

## 2023-07-31 DIAGNOSIS — I714 Abdominal aortic aneurysm, without rupture, unspecified: Secondary | ICD-10-CM

## 2023-07-31 DIAGNOSIS — I25118 Atherosclerotic heart disease of native coronary artery with other forms of angina pectoris: Secondary | ICD-10-CM | POA: Diagnosis not present

## 2023-07-31 DIAGNOSIS — E1151 Type 2 diabetes mellitus with diabetic peripheral angiopathy without gangrene: Secondary | ICD-10-CM

## 2023-07-31 DIAGNOSIS — I10 Essential (primary) hypertension: Secondary | ICD-10-CM | POA: Diagnosis not present

## 2023-07-31 DIAGNOSIS — E785 Hyperlipidemia, unspecified: Secondary | ICD-10-CM

## 2023-07-31 DIAGNOSIS — E1169 Type 2 diabetes mellitus with other specified complication: Secondary | ICD-10-CM

## 2023-07-31 DIAGNOSIS — I6522 Occlusion and stenosis of left carotid artery: Secondary | ICD-10-CM | POA: Diagnosis not present

## 2023-07-31 DIAGNOSIS — I724 Aneurysm of artery of lower extremity: Secondary | ICD-10-CM

## 2023-07-31 MED ORDER — LISINOPRIL 40 MG PO TABS: 40.0000 mg | ORAL_TABLET | Freq: Every day | ORAL | 3 refills | Status: AC

## 2023-07-31 MED ORDER — CLOPIDOGREL BISULFATE 75 MG PO TABS: 75.0000 mg | ORAL_TABLET | Freq: Every day | ORAL | 3 refills | Status: AC

## 2023-07-31 MED ORDER — METOPROLOL TARTRATE 25 MG PO TABS: 12.5000 mg | ORAL_TABLET | Freq: Two times a day (BID) | ORAL | 3 refills | Status: AC

## 2023-07-31 MED ORDER — ATORVASTATIN CALCIUM 40 MG PO TABS: ORAL_TABLET | ORAL | 3 refills | Status: AC

## 2023-07-31 MED ORDER — FENOFIBRATE 145 MG PO TABS: ORAL_TABLET | ORAL | 3 refills | Status: AC

## 2023-07-31 MED ORDER — AMLODIPINE BESYLATE 5 MG PO TABS: ORAL_TABLET | ORAL | 3 refills | Status: AC

## 2023-07-31 NOTE — Patient Instructions (Signed)

## 2023-08-07 DIAGNOSIS — C44111 Basal cell carcinoma of skin of unspecified eyelid, including canthus: Secondary | ICD-10-CM | POA: Diagnosis not present

## 2023-08-07 DIAGNOSIS — C4401 Basal cell carcinoma of skin of lip: Secondary | ICD-10-CM | POA: Diagnosis not present

## 2023-08-07 DIAGNOSIS — C44619 Basal cell carcinoma of skin of left upper limb, including shoulder: Secondary | ICD-10-CM | POA: Diagnosis not present

## 2023-08-07 DIAGNOSIS — L821 Other seborrheic keratosis: Secondary | ICD-10-CM | POA: Diagnosis not present

## 2023-08-07 DIAGNOSIS — D225 Melanocytic nevi of trunk: Secondary | ICD-10-CM | POA: Diagnosis not present

## 2023-08-07 DIAGNOSIS — D485 Neoplasm of uncertain behavior of skin: Secondary | ICD-10-CM | POA: Diagnosis not present

## 2023-08-07 DIAGNOSIS — D2261 Melanocytic nevi of right upper limb, including shoulder: Secondary | ICD-10-CM | POA: Diagnosis not present

## 2023-08-07 DIAGNOSIS — D2262 Melanocytic nevi of left upper limb, including shoulder: Secondary | ICD-10-CM | POA: Diagnosis not present

## 2023-08-20 ENCOUNTER — Other Ambulatory Visit: Payer: Self-pay | Admitting: Cardiovascular Disease

## 2023-08-22 DIAGNOSIS — H348312 Tributary (branch) retinal vein occlusion, right eye, stable: Secondary | ICD-10-CM | POA: Diagnosis not present

## 2023-08-22 DIAGNOSIS — H43813 Vitreous degeneration, bilateral: Secondary | ICD-10-CM | POA: Diagnosis not present

## 2023-08-22 DIAGNOSIS — H353221 Exudative age-related macular degeneration, left eye, with active choroidal neovascularization: Secondary | ICD-10-CM | POA: Diagnosis not present

## 2023-08-22 DIAGNOSIS — H1132 Conjunctival hemorrhage, left eye: Secondary | ICD-10-CM | POA: Diagnosis not present

## 2023-08-22 DIAGNOSIS — H31011 Macula scars of posterior pole (postinflammatory) (post-traumatic), right eye: Secondary | ICD-10-CM | POA: Diagnosis not present

## 2023-08-22 DIAGNOSIS — H353213 Exudative age-related macular degeneration, right eye, with inactive scar: Secondary | ICD-10-CM | POA: Diagnosis not present

## 2023-09-20 DIAGNOSIS — C44619 Basal cell carcinoma of skin of left upper limb, including shoulder: Secondary | ICD-10-CM | POA: Diagnosis not present

## 2023-09-26 ENCOUNTER — Other Ambulatory Visit: Payer: Self-pay | Admitting: Family Medicine

## 2023-10-10 DIAGNOSIS — E119 Type 2 diabetes mellitus without complications: Secondary | ICD-10-CM | POA: Diagnosis not present

## 2023-10-11 ENCOUNTER — Telehealth: Payer: Self-pay

## 2023-10-11 NOTE — Telephone Encounter (Signed)
 Spoke with patient to advise he has 6 boxes of lantus available  for pick up

## 2023-10-13 NOTE — Telephone Encounter (Signed)
 Patient picked up medication

## 2023-10-24 DIAGNOSIS — H31011 Macula scars of posterior pole (postinflammatory) (post-traumatic), right eye: Secondary | ICD-10-CM | POA: Diagnosis not present

## 2023-10-24 DIAGNOSIS — H353221 Exudative age-related macular degeneration, left eye, with active choroidal neovascularization: Secondary | ICD-10-CM | POA: Diagnosis not present

## 2023-10-24 DIAGNOSIS — H1132 Conjunctival hemorrhage, left eye: Secondary | ICD-10-CM | POA: Diagnosis not present

## 2023-10-24 DIAGNOSIS — H43813 Vitreous degeneration, bilateral: Secondary | ICD-10-CM | POA: Diagnosis not present

## 2023-10-24 DIAGNOSIS — H348312 Tributary (branch) retinal vein occlusion, right eye, stable: Secondary | ICD-10-CM | POA: Diagnosis not present

## 2023-10-25 DIAGNOSIS — C4401 Basal cell carcinoma of skin of lip: Secondary | ICD-10-CM | POA: Diagnosis not present

## 2023-10-25 DIAGNOSIS — C441192 Basal cell carcinoma of skin of left lower eyelid, including canthus: Secondary | ICD-10-CM | POA: Diagnosis not present

## 2023-10-25 DIAGNOSIS — L578 Other skin changes due to chronic exposure to nonionizing radiation: Secondary | ICD-10-CM | POA: Diagnosis not present

## 2023-11-05 ENCOUNTER — Other Ambulatory Visit: Payer: Self-pay | Admitting: Family Medicine

## 2023-11-06 ENCOUNTER — Other Ambulatory Visit: Payer: Self-pay

## 2023-11-06 NOTE — Telephone Encounter (Unsigned)
 Copied from CRM [id number]. Topic: Clinical - Medication Refill >> Nov 06, 2023  4:32 PM Keitha Pata L wrote: Most Recent Primary Care Visit:  Provider: Donnie Galea  Department: [facility]  Visit Type: OFFICE VISIT  Date: 04/18/2023  Medication: glucose blood (ONETOUCH ULTRA) test strip  Has the patient contacted their pharmacy? No (Agent: If no, request that the patient contact the pharmacy for the refill. If patient does not wish to contact the pharmacy document the reason why and proceed with request.) (Agent: If yes, when and what did the pharmacy advise?)  Is this the correct pharmacy for this prescription? Yes If no, delete pharmacy and type the correct one.  This is the patient's preferred pharmacy:  [id number] - [place], [place] - [address] AT [facility] [address] [facility] [address] [place] [address] Phone: [phone] Fax: [phone]   Has the prescription been filled recently? No  Is the patient out of the medication? Yes  Has the patient been seen for an appointment in the last year OR does the patient have an upcoming appointment? Yes  Can we respond through MyChart? No  Agent: Please be advised that Rx refills may take up to 3 business days. We ask that you follow-up with your pharmacy.

## 2023-11-07 ENCOUNTER — Other Ambulatory Visit: Payer: Self-pay

## 2023-11-07 MED ORDER — ONETOUCH ULTRA VI STRP: ORAL_STRIP | 4 refills | Status: DC

## 2023-11-07 MED ORDER — ONETOUCH ULTRA VI STRP: ORAL_STRIP | 4 refills | Status: AC

## 2023-11-09 DIAGNOSIS — E119 Type 2 diabetes mellitus without complications: Secondary | ICD-10-CM | POA: Diagnosis not present

## 2023-11-28 ENCOUNTER — Encounter (INDEPENDENT_AMBULATORY_CARE_PROVIDER_SITE_OTHER): Payer: Self-pay

## 2023-12-10 DIAGNOSIS — E119 Type 2 diabetes mellitus without complications: Secondary | ICD-10-CM | POA: Diagnosis not present

## 2024-01-02 DIAGNOSIS — H43813 Vitreous degeneration, bilateral: Secondary | ICD-10-CM | POA: Diagnosis not present

## 2024-01-02 DIAGNOSIS — H31011 Macula scars of posterior pole (postinflammatory) (post-traumatic), right eye: Secondary | ICD-10-CM | POA: Diagnosis not present

## 2024-01-02 DIAGNOSIS — H348312 Tributary (branch) retinal vein occlusion, right eye, stable: Secondary | ICD-10-CM | POA: Diagnosis not present

## 2024-01-02 DIAGNOSIS — H353221 Exudative age-related macular degeneration, left eye, with active choroidal neovascularization: Secondary | ICD-10-CM | POA: Diagnosis not present

## 2024-01-03 ENCOUNTER — Ambulatory Visit: Payer: Medicare Other | Admitting: Dermatology

## 2024-01-03 ENCOUNTER — Telehealth: Payer: Self-pay

## 2024-01-03 NOTE — Telephone Encounter (Signed)
 Spoke with patients wife who is on DPR to advise that he has 6 boxes of Lantus  available for pick up

## 2024-01-08 NOTE — Telephone Encounter (Signed)
 Pt picked up meds.

## 2024-01-09 DIAGNOSIS — E119 Type 2 diabetes mellitus without complications: Secondary | ICD-10-CM | POA: Diagnosis not present

## 2024-02-26 ENCOUNTER — Ambulatory Visit (INDEPENDENT_AMBULATORY_CARE_PROVIDER_SITE_OTHER): Payer: Medicare Other

## 2024-02-26 VITALS — BP 140/70 | Ht 68.0 in | Wt 199.0 lb

## 2024-02-26 DIAGNOSIS — Z0000 Encounter for general adult medical examination without abnormal findings: Secondary | ICD-10-CM

## 2024-02-26 NOTE — Progress Notes (Signed)
 Because this visit was a virtual/telehealth visit,  certain criteria was not obtained, such a blood pressure, CBG if applicable, and timed get up and go. Any medications not marked as taking were not mentioned during the medication reconciliation part of the visit. Any vitals not documented were not able to be obtained due to this being a telehealth visit or patient was unable to self-report a recent blood pressure reading due to a lack of equipment at home via telehealth. Vitals that have been documented are verbally provided by the patient.   This visit was performed by a medical professional under my direct supervision. I was immediately available for consultation/collaboration. I have reviewed and agree with the Annual Wellness Visit documentation.  Subjective:   [name] is a 78 y.o. who presents for a Medicare Wellness preventive visit.  As a reminder, Annual Wellness Visits don't include a physical exam, and some assessments may be limited, especially if this visit is performed virtually. We may recommend an in-person follow-up visit with your provider if needed.  Visit Complete: Virtual I connected with  [name] on 02/26/24 by a audio enabled telemedicine application and verified that I am speaking with the correct person using two identifiers.  Patient Location: Home  Provider Location: Home Office  I discussed the limitations of evaluation and management by telemedicine. The patient expressed understanding and agreed to proceed.  Vital Signs: Because this visit was a virtual/telehealth visit, some criteria may be missing or patient reported. Any vitals not documented were not able to be obtained and vitals that have been documented are patient reported.  VideoDeclined- This patient declined Librarian, academic. Therefore the visit was completed with audio only.  Persons Participating in Visit: Patient.  AWV Questionnaire: No: Patient  Medicare AWV questionnaire was not completed prior to this visit.  Cardiac Risk Factors include: advanced age (>64men, >41 women);male gender;obesity (BMI >30kg/m2);diabetes mellitus;hypertension;Other (see comment), Risk factor comments: copd     Objective:    Today's Vitals   02/26/24 1358  BP: (!) 140/70  Weight: 199 lb (90.3 kg)  Height: 5' 8 (1.727 m)   Body mass index is 30.26 kg/m.     02/26/2024    2:02 PM 02/20/2023    2:04 PM 02/17/2022    1:05 PM 05/05/2021    1:51 PM 02/09/2021    8:20 AM 11/22/2019    9:51 AM 11/21/2019    4:19 PM  Advanced Directives  Does Patient Have a Medical Advance Directive? No No No No No No No  Does patient want to make changes to medical advance directive?     No - Patient declined    Would patient like information on creating a medical advance directive? No - Patient declined No - Patient declined No - Patient declined        Current Medications (verified) Outpatient Encounter Medications as of 02/26/2024  Medication Sig   acetaminophen  (TYLENOL ) 325 MG tablet Take 1-2 tablets (325-650 mg total) by mouth every 4 (four) hours as needed for mild pain (or temp >/= 101 F).   albuterol  (PROAIR  HFA) 108 (90 BASE) MCG/ACT inhaler Inhale 2 puffs into the lungs 3 (three) times daily as needed (cough).   amLODipine  (NORVASC ) 5 MG tablet TAKE 1 TABLET(5 MG) BY MOUTH EVERY NIGHT.   aspirin  EC 81 MG tablet Take 81 mg by mouth daily. Swallow whole.   atorvastatin  (LIPITOR) 40 MG tablet TAKE 1 TABLET(40 MG) BY MOUTH DAILY   B-D  UF III MINI PEN NEEDLES 31G X 5 MM MISC USE AS DIRECTED TO INJECT INSULIN  EVERY DAY   budesonide -formoterol  (SYMBICORT ) 160-4.5 MCG/ACT inhaler Inhale 2 puffs into the lungs 2 (two) times daily.   clopidogrel  (PLAVIX ) 75 MG tablet Take 1 tablet (75 mg total) by mouth daily.   famotidine  (PEPCID ) 20 MG tablet Take 1 tablet (20 mg total) by mouth 2 (two) times daily.   fenofibrate  (TRICOR ) 145 MG tablet TAKE 1 TABLET(145 MG) BY MOUTH  EVERY DAY   glucosamine-chondroitin 500-400 MG tablet Take 1 tablet by mouth 2 (two) times daily.   glucose blood (ONETOUCH ULTRA) test strip USE TO CHECK BLOOD SUGAR TWICE DAILY   insulin  glargine (LANTUS  SOLOSTAR) 100 UNIT/ML Solostar Pen ADMINISTER 100 UNITS UNDER THE SKIN DAILY (2 split doses with 50 units each)   Lancets (ONETOUCH ULTRASOFT) lancets Use as instructed to check sugar, dx E11.9.   lisinopril  (ZESTRIL ) 40 MG tablet Take 1 tablet (40 mg total) by mouth daily.   meclizine  (ANTIVERT ) 25 MG tablet Take 25 mg by mouth every 6 (six) hours as needed. For dizziness/vertigo   metFORMIN  (GLUCOPHAGE ) 500 MG tablet TAKE 1 TABLET(500 MG) BY MOUTH TWICE DAILY WITH A MEAL   metoprolol  tartrate (LOPRESSOR ) 25 MG tablet Take 0.5 tablets (12.5 mg total) by mouth 2 (two) times daily.   Multiple Vitamins-Minerals (PRESERVISION AREDS 2 PO) Take 1 tablet by mouth 2 (two) times daily.   nitroGLYCERIN  (NITROSTAT ) 0.4 MG SL tablet Place 1 tablet (0.4 mg total) under the tongue every 5 (five) minutes as needed. For chest pain   blood glucose meter kit and supplies KIT 1 each by Does not apply route daily as needed. Dispense based on patient and insurance preference. Use up to four times daily as directed. (FOR ICD-9 250.00, 250.01). (Patient not taking: Reported on 02/26/2024)   EPINEPHrine  0.3 mg/0.3 mL IJ SOAJ injection SMARTSIG:0.3 Milliliter(s) IM Once PRN (Patient not taking: Reported on 02/26/2024)   No facility-administered encounter medications on file as of 02/26/2024.    Allergies (verified) Augmentin  [amoxicillin -pot clavulanate], Morphine  and codeine , and Metformin  and related   History: Past Medical History:  Diagnosis Date   CAD (coronary artery disease)    a. 1999: PCI-->LAD 2/2 MI; b. 2005: inf MI s/p PCI/DES x 4 to RCA; c. Myoview  in 12/2005: EF 61%, no evidence for ischemia; d. cath 10/2014: occluded mLAD w/ L to L and L to R collats, dRCA 95% s/p PCI/DES 0%, mPDA 70%, LCx mild to mod  irregs, procedure complicated by inf ST ele in recovery, repeat cath showed acute dRCA stent thrombosis o/w occluded mRPDA, PTCA dRCA, PCI/DES RPDA, aggrastat x 18 hr   CKD (chronic kidney disease)    COPD (chronic obstructive pulmonary disease) (HCC)    DM2 (diabetes mellitus, type 2) (HCC)    H/O hiatal hernia    Heart attack (HCC) [phone]   had 2 in one day(released plaque during angio). 5 stents total   HTN (hypertension)    Hypercholesterolemia    Impaired fasting glucose    Elevated after steroid injection   Macular degeneration of May eye 2020   receiving injections into eye   Osteoarthritis of hip    injections in both hips   Osteoarthritis, knee    PAD (peripheral artery disease) (HCC)    L fem to below the knee popliteal vein bypass graft   Peri-rectal abscess 12/27/2011   Tobacco abuse    Prior   Past Surgical History:  Procedure Laterality  Date   CORONARY ANGIOPLASTY     CORONARY STENT PLACEMENT  1999, 2006, 2016 x 2   Multiple, LAD in 1999, RCA in 2005.  has a total of 4 stents   ENDARTERECTOMY Left 09/21/2018   Procedure: ENDARTERECTOMY CAROTID;  Surgeon: Jama Cordella MATSU, MD;  Location: [facility] ORS;  Service: Vascular;  Laterality: Left;   FEMORAL-POPLITEAL BYPASS GRAFT  2016   L fem to below the knee popliteal vein bypass graft   INCISE AND DRAIN ABCESS  2007   on scrotum   INCISION AND DRAINAGE PERIRECTAL ABSCESS  12/28/2011   Procedure: IRRIGATION AND DEBRIDEMENT PERIRECTAL ABSCESS;  Surgeon: Deward GORMAN Curvin DOUGLAS, MD;  Location: [facility] OR;  Service: General;  Laterality: N/A;   Family History  Problem Relation Age of Onset   Heart failure Father        CHF   CAD Mother    Diabetes Mother    Colon cancer Neg Hx    Prostate cancer Neg Hx    Social History   Socioeconomic History   Marital status: Married    Spouse name: wanda   Number of children: 2   Years of education: Not on file   Highest education level: Not on file  Occupational History    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    Comment: still works full time  Tobacco Use   Smoking status: Former    Current packs/day: 0.00    Average packs/day: 1 pack/day for 40.0 years (40.0 ttl pk-yrs)    Types: Cigarettes    Start date: 10/21/1958    Quit date: 10/21/1998    Years since quitting: 25.3   Smokeless tobacco: Never   Tobacco comments:    Past smoker, states he will smoke maybe 1 cigarette/ month or so still  Vaping Use   Vaping status: Never Used  Substance and Sexual Activity   Alcohol use: Not Currently    Alcohol/week: 0.0 standard drinks of alcohol    Comment: Occasional beer on the weekends   Drug use: No   Sexual activity: Never  Other Topics Concern   Not on file  Social History Narrative   Lives in [place]   Married 50+ years   2 grown daughters, 8 grandchildren, 9 great grandchildren   Designated Party Release Form signed on 01/19/10 appointing Apolinar Lucks   Social Drivers of Health   Financial Resource Strain: Low Risk  (02/26/2024)   Overall Financial Resource Strain (CARDIA)    Difficulty of Paying Living Expenses: Not hard at all  Food Insecurity: No Food Insecurity (02/26/2024)   Hunger Vital Sign    Worried About Running Out of Food in the Last Year: Never true    Ran Out of Food in the Last Year: Never true  Transportation Needs: No Transportation Needs (02/26/2024)   PRAPARE - Administrator, Civil Service (Medical): No    Lack of Transportation (Non-Medical): No  Physical Activity: Sufficiently Active (02/26/2024)   Exercise Vital Sign    Days of Exercise per Week: 7 days    Minutes of Exercise per Session: 30 min  Stress: No Stress Concern Present (02/26/2024)   [employer] of Occupational Health - Occupational Stress Questionnaire    Feeling of Stress: Not at all  Social Connections: Moderately Integrated (02/26/2024)   Social Connection and Isolation Panel    Frequency of Communication with Friends and  Family: More than three times a week    Frequency of Social Gatherings  with Friends and Family: More than three times a week    Attends Religious Services: More than 4 times per year    Active Member of Clubs or Organizations: No    Attends Engineer, structural: Never    Marital Status: Married    Tobacco Counseling Counseling given: Not Answered Tobacco comments: Past smoker, states he will smoke maybe 1 cigarette/ month or so still    Clinical Intake:  Pre-visit preparation completed: Yes  Pain : No/denies pain     BMI - recorded: 30.26 Nutritional Status: BMI > 30  Obese Nutritional Risks: None Diabetes: Yes CBG done?: No Did pt. bring in CBG monitor from home?: No  Lab Results  Component Value Date   HGBA1C 8.7 (H) 04/13/2023   HGBA1C 8.5 (H) 10/06/2022   HGBA1C 8.9 (H) 03/23/2022     How often do you need to have someone help you when you read instructions, pamphlets, or other written materials from your doctor or pharmacy?: 1 - Never  Interpreter Needed?: No  Information entered by :: Brand Siever,CMA   Activities of Daily Living     02/26/2024    2:01 PM  In your present state of health, do you have any difficulty performing the following activities:  Hearing? 0  Vision? 0  Difficulty concentrating or making decisions? 0  Walking or climbing stairs? 0  Dressing or bathing? 0  Doing errands, shopping? 0  Preparing Food and eating ? N  Using the Toilet? N  In the past six months, have you accidently leaked urine? N  Do you have problems with loss of bowel control? N  Managing your Medications? N  Managing your Finances? N  Housekeeping or managing your Housekeeping? N    Patient Care Team: Cleatus Arlyss RAMAN, MD as PCP - General Perla Evalene PARAS, MD as PCP - Cardiology (Cardiology) Perla Evalene PARAS, MD as Consulting Physician (Cardiology) Fate Morna SAILOR, [facility] (Inactive) as Pharmacist (Pharmacist) Carolee Manus DASEN., MD  (Ophthalmology)  I have updated your Care Teams any recent Medical Services you may have received from other providers in the past year.     Assessment:   This is a routine wellness examination for [name].  Hearing/Vision screen Hearing Screening - Comments:: Patient wears hearing aids  Vision Screening - Comments:: No difficulties    Goals Addressed             This Visit's Progress    Patient Stated       To get moved       Depression Screen     02/26/2024    2:03 PM 04/18/2023   11:17 AM 02/20/2023    2:03 PM 10/06/2022    2:10 PM 02/17/2022    1:03 PM 02/09/2021    8:21 AM 05/14/2020    9:16 AM  PHQ 2/9 Scores  PHQ - 2 Score 0 0 0 0 0 0 0  PHQ- 9 Score 0 0  0 0 0     Fall Risk     02/26/2024    2:02 PM 04/18/2023   11:17 AM 02/20/2023    1:59 PM 10/06/2022    2:10 PM 02/17/2022    1:06 PM  Fall Risk   Falls in the past year? 0 0 0 0 0  Number falls in past yr: 0 0 0 0 0  Injury with Fall? 0 0 0 0 0  Risk for fall due to : No Fall Risks No Fall Risks No Fall  Risks No Fall Risks No Fall Risks  Follow up Falls evaluation completed Falls evaluation completed Falls prevention discussed;Falls evaluation completed Falls evaluation completed Falls evaluation completed      Data saved with a previous flowsheet row definition    MEDICARE RISK AT HOME:  Medicare Risk at Home Any stairs in or around the home?: Yes If so, are there any without handrails?: No Home free of loose throw rugs in walkways, pet beds, electrical cords, etc?: Yes Adequate lighting in your home to reduce risk of falls?: Yes Life alert?: No Use of a cane, walker or w/c?: No Grab bars in the bathroom?: No Shower chair or bench in shower?: Yes Elevated toilet seat or a handicapped toilet?: Yes  TIMED UP AND GO:  Was the test performed?  No  Cognitive Function: 6CIT completed    02/09/2021    8:23 AM 03/31/2017    3:07 PM 11/20/2015    3:15 PM  MMSE - Mini Mental State Exam  Not completed:  Refused    Orientation to time  5  5   Orientation to Place  5  5   Registration  3  3   Attention/ Calculation  0  0   Recall  2  3   Recall-comments  pt was unable to recall 1 of 3 words    Language- name 2 objects  0  0   Language- repeat  1 1  Language- follow 3 step command  3  3   Language- read & follow direction  0  0   Write a sentence  0  0   Copy design  0  0   Total score  19  20      Data saved with a previous flowsheet row definition        02/26/2024    2:00 PM 02/20/2023    2:06 PM  6CIT Screen  What Year? 0 points 0 points  What month? 0 points 0 points  What time? 0 points 0 points  Count back from 20 0 points 0 points  Months in reverse 0 points 0 points  Repeat phrase 0 points 2 points  Total Score 0 points 2 points    Immunizations Immunization History  Administered Date(s) Administered   Fluad Quad(high Dose 65+) 05/14/2020, 05/27/2021, 04/07/2022   Fluad Trivalent(High Dose 65+) 04/18/2023   Influenza,inj,Quad PF,6+ Mos 07/16/2013, 04/21/2014, 06/02/2015, 06/10/2016, 03/31/2017, 08/02/2018   PFIZER(Purple Top)SARS-COV-2 Vaccination 08/17/2019, 09/07/2019, 04/25/2020   Pneumococcal Conjugate-13 10/20/2014   Pneumococcal Polysaccharide-23 12/29/2011   Td 01/19/2010    Screening Tests Health Maintenance  Topic Date Due   Zoster Vaccines- Shingrix (1 of 2) Never done   DTaP/Tdap/Td (2 - Tdap) 01/20/2020   OPHTHALMOLOGY EXAM  08/11/2021   Diabetic kidney evaluation - Urine ACR  10/06/2023   HEMOGLOBIN A1C  10/12/2023   INFLUENZA VACCINE  02/09/2024   Diabetic kidney evaluation - eGFR measurement  04/12/2024   FOOT EXAM  04/17/2024   Medicare Annual Wellness (AWV)  02/25/2025   Pneumococcal Vaccine: 50+ Years  Completed   Hepatitis C Screening  Completed   HPV VACCINES  Aged Out   Meningococcal B Vaccine  Aged Out   Colonoscopy  Discontinued   COVID-19 Vaccine  Discontinued    Health Maintenance  Health Maintenance Due  Topic Date Due    Zoster Vaccines- Shingrix (1 of 2) Never done   DTaP/Tdap/Td (2 - Tdap) 01/20/2020   OPHTHALMOLOGY EXAM  08/11/2021   Diabetic  kidney evaluation - Urine ACR  10/06/2023   HEMOGLOBIN A1C  10/12/2023   INFLUENZA VACCINE  02/09/2024   Health Maintenance Items Addressed:patient declined   Additional Screening:  Vision Screening: Recommended annual ophthalmology exams for early detection of glaucoma and other disorders of the eye. Would you like a referral to an eye doctor? No    Dental Screening: Recommended annual dental exams for proper oral hygiene  Community Resource Referral / Chronic Care Management: CRR required this visit?  No   CCM required this visit?  No   Plan:    I have personally reviewed and noted the following in the patient's chart:   Medical and social history Use of alcohol, tobacco or illicit drugs  Current medications and supplements including opioid prescriptions. Patient is not currently taking opioid prescriptions. Functional ability and status Nutritional status Physical activity Advanced directives List of other physicians Hospitalizations, surgeries, and ER visits in previous 12 months Vitals Screenings to include cognitive, depression, and falls Referrals and appointments  In addition, I have reviewed and discussed with patient certain preventive protocols, quality metrics, and best practice recommendations. A written personalized care plan for preventive services as well as general preventive health recommendations were provided to patient.   [name], [place]   02/26/2024   After Visit Summary: (MyChart) Due to this being a telephonic visit, the after visit summary with patients personalized plan was offered to patient via MyChart   Notes: Nothing significant to report at this time.

## 2024-02-26 NOTE — Patient Instructions (Signed)
 Mr. Guzzetta , Thank you for taking time out of your busy schedule to complete your Annual Wellness Visit with me. I enjoyed our conversation and look forward to speaking with you again next year. I, as well as your care team,  appreciate your ongoing commitment to your health goals. Please review the following plan we discussed and let me know if I can assist you in the future. Your Game plan/ To Do List    Referrals: If you haven't heard from the office you've been referred to, please reach out to them at the phone provided.   Follow up Visits: We will see or speak with you next year for your Next Medicare AWV with our clinical staff Have you seen your provider in the last 6 months (3 months if uncontrolled diabetes)? No  Clinician Recommendations:  Aim for 30 minutes of exercise or brisk walking, 6-8 glasses of water, and 5 servings of fruits and vegetables each day.       This is a list of the screenings recommended for you:  Health Maintenance  Topic Date Due   Zoster (Shingles) Vaccine (1 of 2) Never done   DTaP/Tdap/Td vaccine (2 - Tdap) 01/20/2020   Eye exam for diabetics  08/11/2021   Yearly kidney health urinalysis for diabetes  10/06/2023   Hemoglobin A1C  10/12/2023   Flu Shot  02/09/2024   Yearly kidney function blood test for diabetes  04/12/2024   Complete foot exam   04/17/2024   Medicare Annual Wellness Visit  02/25/2025   Pneumococcal Vaccine for age over 36  Completed   Hepatitis C Screening  Completed   HPV Vaccine  Aged Out   Meningitis B Vaccine  Aged Out   Colon Cancer Screening  Discontinued   COVID-19 Vaccine  Discontinued    Advanced directives: (Declined) Advance directive discussed with you today. Even though you declined this today, please call our office should you change your mind, and we can give you the proper paperwork for you to fill out. Advance Care Planning is important because it:  [x]  Makes sure you receive the medical care that is consistent  with your values, goals, and preferences  [x]  It provides guidance to your family and loved ones and reduces their decisional burden about whether or not they are making the right decisions based on your wishes.  Follow the link provided in your after visit summary or read over the paperwork we have mailed to you to help you started getting your Advance Directives in place. If you need assistance in completing these, please reach out to us  so that we can help you!  See attachments for Preventive Care and Fall Prevention Tips.

## 2024-02-27 ENCOUNTER — Other Ambulatory Visit (INDEPENDENT_AMBULATORY_CARE_PROVIDER_SITE_OTHER): Payer: Self-pay | Admitting: Vascular Surgery

## 2024-02-27 DIAGNOSIS — Z9889 Other specified postprocedural states: Secondary | ICD-10-CM

## 2024-02-27 DIAGNOSIS — I724 Aneurysm of artery of lower extremity: Secondary | ICD-10-CM

## 2024-02-27 DIAGNOSIS — I6523 Occlusion and stenosis of bilateral carotid arteries: Secondary | ICD-10-CM

## 2024-02-27 DIAGNOSIS — I714 Abdominal aortic aneurysm, without rupture, unspecified: Secondary | ICD-10-CM

## 2024-02-27 NOTE — Progress Notes (Unsigned)
 MRN : [id number]  [name] is a 78 y.o. (July 31, 1945) male who presents with chief complaint of check circulation.  History of Present Illness:   The patient returns to the office for followup and review of the noninvasive studies. There have been no interval changes in lower extremity symptoms. No interval shortening of the patient's claudication distance or development of rest pain symptoms. No new ulcers or wounds have occurred since the last visit.   He is also followed for carotid stenosis.  He is s/p left CEA on 09/21/2018.  The carotid stenosis followed by ultrasound.    The patient denies amaurosis fugax. There is no recent history of TIA symptoms or focal motor deficits. There is no prior documented CVA.   The patient is taking enteric-coated aspirin  81 mg daily.   The patient is also followed for  a known abdominal aortic aneurysm. Patient denies abdominal pain or back pain, no other abdominal complaints. No changes suggesting embolic episodes.    There have been no significant changes to the patient's overall health care.   The patient denies history of DVT, PE or superficial thrombophlebitis. The patient denies recent episodes of angina or shortness of breath.    ABI Rt=1.27 and Lt=1.19  (previous ABI's Rt=1.09 and Lt=1.09 )   Carotid duplex done today shows RICA 1-39% and LICA widely patent s/p CEA.  No change compared to last study in 02/2023 . The bilateral vertebral arteries demonstrate antegrade flow with normal flow hemodynamics in the bilateral subclavian arteries.   Abdominal aortic duplex done today shows an AAA that measures 2.7 cm. No change compared to last study.   Duplex ultrasound of the bilateral lower extremities shows patent right lower extremity arterial system without hemodynamically significant stenosis, on the left there is moderate stenosis of the origin of the left profunda  femoris artery but the bypass is widely patent.  Rt popliteal artery measures 1.00 cm  No outpatient medications have been marked as taking for the 02/29/24 encounter (Appointment) with Jama, Cordella MATSU, MD.    Past Medical History:  Diagnosis Date   CAD (coronary artery disease)    a. 1999: PCI-->LAD 2/2 MI; b. 2005: inf MI s/p PCI/DES x 4 to RCA; c. Myoview  in 12/2005: EF 61%, no evidence for ischemia; d. cath 10/2014: occluded mLAD w/ L to L and L to R collats, dRCA 95% s/p PCI/DES 0%, mPDA 70%, LCx mild to mod irregs, procedure complicated by inf ST ele in recovery, repeat cath showed acute dRCA stent thrombosis o/w occluded mRPDA, PTCA dRCA, PCI/DES RPDA, aggrastat x 18 hr   CKD (chronic kidney disease)    COPD (chronic obstructive pulmonary disease) (HCC)    DM2 (diabetes mellitus, type 2) (HCC)    H/O hiatal hernia    Heart attack (HCC) [phone]   had 2 in one day(released plaque during angio). 5 stents total   HTN (hypertension)    Hypercholesterolemia    Impaired fasting glucose    Elevated after steroid injection   Macular degeneration of right eye 2020   receiving injections into eye  Osteoarthritis of hip    injections in both hips   Osteoarthritis, knee    PAD (peripheral artery disease) (HCC)    L fem to below the knee popliteal vein bypass graft   Peri-rectal abscess 12/27/2011   Tobacco abuse    Prior    Past Surgical History:  Procedure Laterality Date   CORONARY ANGIOPLASTY     CORONARY STENT PLACEMENT  1999, 2006, 2016 x 2   Multiple, LAD in 1999, RCA in 2005.  has a total of 4 stents   ENDARTERECTOMY Left 09/21/2018   Procedure: ENDARTERECTOMY CAROTID;  Surgeon: Jama Cordella MATSU, MD;  Location: [facility] ORS;  Service: Vascular;  Laterality: Left;   FEMORAL-POPLITEAL BYPASS GRAFT  2016   L fem to below the knee popliteal vein bypass graft   INCISE AND DRAIN ABCESS  2007   on scrotum   INCISION AND DRAINAGE PERIRECTAL ABSCESS  12/28/2011   Procedure:  IRRIGATION AND DEBRIDEMENT PERIRECTAL ABSCESS;  Surgeon: Deward GORMAN Null III, MD;  Location: [facility];  Service: General;  Laterality: N/A;    Social History Social History   Tobacco Use   Smoking status: Former    Current packs/day: 0.00    Average packs/day: 1 pack/day for 40.0 years (40.0 ttl pk-yrs)    Types: Cigarettes    Start date: 10/21/1958    Quit date: 10/21/1998    Years since quitting: 25.3   Smokeless tobacco: Never   Tobacco comments:    Past smoker, states he will smoke maybe 1 cigarette/ month or so still  Vaping Use   Vaping status: Never Used  Substance Use Topics   Alcohol use: Not Currently    Alcohol/week: 0.0 standard drinks of alcohol    Comment: Occasional beer on the weekends   Drug use: No    Family History Family History  Problem Relation Age of Onset   Heart failure Father        CHF   CAD Mother    Diabetes Mother    Colon cancer Neg Hx    Prostate cancer Neg Hx     Allergies  Allergen Reactions   Augmentin  [Amoxicillin -Pot Clavulanate] Other (See Comments)    Nausea,vomiting,diarrhea   Morphine  And Codeine  Nausea And Vomiting    Vomiting, GI upset   Metformin  And Related Other (See Comments)    Intolerant of 2000mg  a day.      REVIEW OF SYSTEMS (Negative unless checked)  Constitutional: [] Weight loss  [] Fever  [] Chills Cardiac: [] Chest pain   [] Chest pressure   [] Palpitations   [] Shortness of breath when laying flat   [] Shortness of breath with exertion. Vascular:  [x] Pain in legs with walking   [] Pain in legs at rest  [] History of DVT   [] Phlebitis   [] Swelling in legs   [] Varicose veins   [] Non-healing ulcers Pulmonary:   [] Uses home oxygen   [] Productive cough   [] Hemoptysis   [] Wheeze  [] COPD   [] Asthma Neurologic:  [] Dizziness   [] Seizures   [] History of stroke   [] History of TIA  [] Aphasia   [] Vissual changes   [] Weakness or numbness in arm   [] Weakness or numbness in leg Musculoskeletal:   [] Joint swelling   [] Joint pain   [] Low back  pain Hematologic:  [] Easy bruising  [] Easy bleeding   [] Hypercoagulable state   [] Anemic Gastrointestinal:  [] Diarrhea   [] Vomiting  [] Gastroesophageal reflux/heartburn   [] Difficulty swallowing. Genitourinary:  [] Chronic kidney disease   [] Difficult urination  [] Frequent urination   [] Blood  in urine Skin:  [] Rashes   [] Ulcers  Psychological:  [] History of anxiety   []  History of major depression.  Physical Examination  There were no vitals filed for this visit. There is no height or weight on file to calculate BMI. Gen: WD/WN, NAD Head: [place]/AT, No temporalis wasting.  Ear/Nose/Throat: Hearing grossly intact, nares w/o erythema or drainage Eyes: PER, EOMI, sclera nonicteric.  Neck: Supple, no masses.  No bruit or JVD.  Pulmonary:  Good air movement, no audible wheezing, no use of accessory muscles.  Cardiac: RRR, normal S1, S2, no Murmurs. Vascular:  mild trophic changes, no open wounds Vessel Right Left  Radial Palpable Palpable  PT Not Palpable Not Palpable  DP Not Palpable Not Palpable  Gastrointestinal: soft, non-distended. No guarding/no peritoneal signs.  Musculoskeletal: M/S 5/5 throughout.  No visible deformity.  Neurologic: CN 2-12 intact. Pain and light touch intact in extremities.  Symmetrical.  Speech is fluent. Motor exam as listed above. Psychiatric: Judgment intact, Mood & affect appropriate for pt's clinical situation. Dermatologic: No rashes or ulcers noted.  No changes consistent with cellulitis.   CBC Lab Results  Component Value Date   WBC 7.1 04/13/2023   HGB 15.1 04/13/2023   HCT 46.0 04/13/2023   MCV 93.1 04/13/2023   PLT 192.0 04/13/2023    BMET    Component Value Date/Time   NA 139 04/13/2023 0814   NA 134 (L) 10/29/2014 0908   K 3.8 04/13/2023 0814   K 4.3 10/29/2014 0908   CL 103 04/13/2023 0814   CL 103 10/29/2014 0908   CO2 29 04/13/2023 0814   CO2 24 10/29/2014 0908   GLUCOSE 164 (H) 04/13/2023 0814   GLUCOSE 142 (H) 10/29/2014 0908    BUN 17 04/13/2023 0814   BUN 20 10/29/2014 0908   CREATININE 1.07 04/13/2023 0814   CREATININE 1.32 (H) 10/29/2014 0908   CREATININE 0.96 01/28/2011 0855   CALCIUM  9.3 04/13/2023 0814   CALCIUM  9.3 10/29/2014 0908   GFRNONAA >60 05/05/2021 1351   GFRNONAA 55 (L) 10/29/2014 0908   GFRAA 56 (L) 11/22/2019 0952   GFRAA >60 10/29/2014 0908   CrCl cannot be calculated (Patient's most recent lab result is older than the maximum 21 days allowed.).  COAG Lab Results  Component Value Date   INR 1.0 05/05/2021   INR 1.0 09/14/2018   INR 0.9 10/24/2014    Radiology No results found.   Assessment/Plan 1. Bilateral carotid artery stenosis (Primary) Recommend:   Given the patient's asymptomatic subcritical stenosis no further invasive testing or surgery at this time.   Duplex ultrasound shows RICA <40% and LICA 40-59% stenosis s/p left CEA.   Continue antiplatelet therapy as prescribed Continue management of CAD, HTN and Hyperlipidemia Healthy heart diet,  encouraged exercise at least 4 times per week Follow up in 12 months with duplex ultrasound and physical exam   - VAS US  CAROTID; Future  2. Atherosclerosis of native artery of both lower extremities with intermittent claudication (HCC) Recommend:   The patient has evidence of atherosclerosis of the lower extremities with claudication.  The patient does not voice lifestyle limiting changes at this point in time.   Noninvasive studies do not suggest clinically significant change.   No invasive studies, angiography or surgery at this time The patient should continue walking and begin a more formal exercise program.  The patient should continue antiplatelet therapy and aggressive treatment of the lipid abnormalities   No changes in the patient's medications at this  time - VAS US  LOWER EXTREMITY ARTERIAL DUPLEX; Future - VAS US  ABI WITH/WO TBI; Future  3. Abdominal aortic aneurysm (AAA) without rupture, unspecified part  (HCC) No surgery or intervention at this time. The patient has an asymptomatic abdominal aortic aneurysm that is less than 4 cm in maximal diameter.  I have discussed the natural history of abdominal aortic aneurysm and the small risk of rupture for aneurysm less than 5 cm in size.  However, as these small aneurysms tend to enlarge over time, continued surveillance with ultrasound or CT scan is mandatory.  I have also discussed optimizing medical management with hypertension and lipid control and the importance of abstinence from tobacco.  The patient is also encouraged to exercise a minimum of 30 minutes 4 times a week.  Should the patient develop new onset abdominal or back pain or signs of peripheral embolization they are instructed to seek medical attention immediately and to alert the physician providing care that they have an aneurysm.  The patient voices their understanding. The patient will return in 12 months with an aortic duplex.  - VAS US  AORTA/IVC/ILIACS; Future  4. Popliteal artery aneurysm (HCC) No surgery or intervention at this time.   The patient has an asymptomatic popliteal artery aneurysm that is less than 2.5 cm in maximal diameter on the right and is s/p bypass on the left.  I have discussed the natural history of popliteal aneurysm and the small risk of thrombosis for aneurysm less than 2.5 cm in size.  However, as these small aneurysms tend to enlarge over time, continued surveillance with ultrasound is mandatory.   I have also discussed optimizing medical management with hypertension and lipid control and the importance of abstinence from tobacco.  The patient is also encouraged to exercise a minimum of 30 minutes 4 times a week.    Should the patient develop new leg pain or signs of peripheral embolization they are instructed to seek medical attention immediately and to alert the physician providing care that they have an aneurysm.  The patient voices their understanding.   - VAS US  LOWER EXTREMITY ARTERIAL DUPLEX; Future  5. Essential hypertension Continue antihypertensive medications as already ordered, these medications have been reviewed and there are no changes at this time.  6. Coronary artery disease of native artery of native heart with stable angina pectoris (HCC) Continue cardiac and antihypertensive medications as already ordered and reviewed, no changes at this time.  Continue statin as ordered and reviewed, no changes at this time  Nitrates PRN for chest pain    Cordella Shawl, MD  02/27/2024 4:43 PM

## 2024-02-29 ENCOUNTER — Encounter (INDEPENDENT_AMBULATORY_CARE_PROVIDER_SITE_OTHER): Payer: Self-pay | Admitting: Vascular Surgery

## 2024-02-29 ENCOUNTER — Other Ambulatory Visit (INDEPENDENT_AMBULATORY_CARE_PROVIDER_SITE_OTHER): Payer: Medicare Other

## 2024-02-29 ENCOUNTER — Ambulatory Visit (INDEPENDENT_AMBULATORY_CARE_PROVIDER_SITE_OTHER): Payer: Medicare Other | Admitting: Vascular Surgery

## 2024-02-29 ENCOUNTER — Other Ambulatory Visit (INDEPENDENT_AMBULATORY_CARE_PROVIDER_SITE_OTHER)

## 2024-02-29 VITALS — BP 136/70 | HR 54 | Ht 68.0 in | Wt 197.0 lb

## 2024-02-29 DIAGNOSIS — Z9889 Other specified postprocedural states: Secondary | ICD-10-CM

## 2024-02-29 DIAGNOSIS — I6523 Occlusion and stenosis of bilateral carotid arteries: Secondary | ICD-10-CM

## 2024-02-29 DIAGNOSIS — I10 Essential (primary) hypertension: Secondary | ICD-10-CM

## 2024-02-29 DIAGNOSIS — I739 Peripheral vascular disease, unspecified: Secondary | ICD-10-CM

## 2024-02-29 DIAGNOSIS — I714 Abdominal aortic aneurysm, without rupture, unspecified: Secondary | ICD-10-CM

## 2024-02-29 DIAGNOSIS — I25118 Atherosclerotic heart disease of native coronary artery with other forms of angina pectoris: Secondary | ICD-10-CM

## 2024-02-29 DIAGNOSIS — I70213 Atherosclerosis of native arteries of extremities with intermittent claudication, bilateral legs: Secondary | ICD-10-CM

## 2024-02-29 DIAGNOSIS — I724 Aneurysm of artery of lower extremity: Secondary | ICD-10-CM

## 2024-03-01 ENCOUNTER — Encounter (INDEPENDENT_AMBULATORY_CARE_PROVIDER_SITE_OTHER): Payer: Self-pay | Admitting: Vascular Surgery

## 2024-03-01 LAB — VAS US ABI WITH/WO TBI
Left ABI: 1.19
Right ABI: 1.27

## 2024-03-05 DIAGNOSIS — H353221 Exudative age-related macular degeneration, left eye, with active choroidal neovascularization: Secondary | ICD-10-CM | POA: Diagnosis not present

## 2024-03-05 DIAGNOSIS — H353213 Exudative age-related macular degeneration, right eye, with inactive scar: Secondary | ICD-10-CM | POA: Diagnosis not present

## 2024-03-05 DIAGNOSIS — H348312 Tributary (branch) retinal vein occlusion, right eye, stable: Secondary | ICD-10-CM | POA: Diagnosis not present

## 2024-03-05 DIAGNOSIS — H31013 Macula scars of posterior pole (postinflammatory) (post-traumatic), bilateral: Secondary | ICD-10-CM | POA: Diagnosis not present

## 2024-04-03 ENCOUNTER — Other Ambulatory Visit: Payer: Self-pay | Admitting: Family Medicine

## 2024-04-12 NOTE — Progress Notes (Signed)
 [name]                                          MRN: [id number]   04/12/2024   The VBCI Quality Team Specialist reviewed this patient medical record for the purposes of chart review for care gap closure. The following were reviewed: chart review for care gap closure-kidney health evaluation for diabetes:eGFR  and uACR.    VBCI Quality Team

## 2024-04-14 ENCOUNTER — Other Ambulatory Visit: Payer: Self-pay | Admitting: Family Medicine

## 2024-04-14 DIAGNOSIS — E1169 Type 2 diabetes mellitus with other specified complication: Secondary | ICD-10-CM

## 2024-04-17 ENCOUNTER — Ambulatory Visit: Payer: Self-pay | Admitting: Family Medicine

## 2024-04-17 ENCOUNTER — Other Ambulatory Visit (INDEPENDENT_AMBULATORY_CARE_PROVIDER_SITE_OTHER)

## 2024-04-17 DIAGNOSIS — E1169 Type 2 diabetes mellitus with other specified complication: Secondary | ICD-10-CM

## 2024-04-17 DIAGNOSIS — E785 Hyperlipidemia, unspecified: Secondary | ICD-10-CM | POA: Diagnosis not present

## 2024-04-17 LAB — LIPID PANEL
Cholesterol: 112 mg/dL (ref 0–200)
HDL: 26.9 mg/dL — ABNORMAL LOW (ref >39.00)
LDL Cholesterol: 44 mg/dL (ref 0–99)
NonHDL: 84.77
Total CHOL/HDL Ratio: 4
Triglycerides: 204 mg/dL — ABNORMAL HIGH (ref 0.0–149.0)
VLDL: 40.8 mg/dL — ABNORMAL HIGH (ref 0.0–40.0)

## 2024-04-17 LAB — HEMOGLOBIN A1C: Hgb A1c MFr Bld: 8.8 % — ABNORMAL HIGH (ref 4.6–6.5)

## 2024-04-17 LAB — CBC WITH DIFFERENTIAL/PLATELET
Basophils Absolute: 0 K/uL (ref 0.0–0.1)
Basophils Relative: 0.6 % (ref 0.0–3.0)
Eosinophils Absolute: 0.3 K/uL (ref 0.0–0.7)
Eosinophils Relative: 4.2 % (ref 0.0–5.0)
HCT: 45 % (ref 39.0–52.0)
Hemoglobin: 14.6 g/dL (ref 13.0–17.0)
Lymphocytes Relative: 22.5 % (ref 12.0–46.0)
Lymphs Abs: 1.5 K/uL (ref 0.7–4.0)
MCHC: 32.5 g/dL (ref 30.0–36.0)
MCV: 92.5 fl (ref 78.0–100.0)
Monocytes Absolute: 0.6 K/uL (ref 0.1–1.0)
Monocytes Relative: 8.3 % (ref 3.0–12.0)
Neutro Abs: 4.3 K/uL (ref 1.4–7.7)
Neutrophils Relative %: 64.4 % (ref 43.0–77.0)
Platelets: 195 K/uL (ref 150.0–400.0)
RBC: 4.87 Mil/uL (ref 4.22–5.81)
RDW: 14.2 % (ref 11.5–15.5)
WBC: 6.7 K/uL (ref 4.0–10.5)

## 2024-04-17 LAB — COMPREHENSIVE METABOLIC PANEL WITH GFR
ALT: 19 U/L (ref 0–53)
AST: 16 U/L (ref 0–37)
Albumin: 3.9 g/dL (ref 3.5–5.2)
Alkaline Phosphatase: 36 U/L — ABNORMAL LOW (ref 39–117)
BUN: 11 mg/dL (ref 6–23)
CO2: 32 meq/L (ref 19–32)
Calcium: 9.2 mg/dL (ref 8.4–10.5)
Chloride: 103 meq/L (ref 96–112)
Creatinine, Ser: 1.04 mg/dL (ref 0.40–1.50)
GFR: 68.54 mL/min (ref >60.00)
Glucose, Bld: 178 mg/dL — ABNORMAL HIGH (ref 70–99)
Potassium: 4.3 meq/L (ref 3.5–5.1)
Sodium: 143 meq/L (ref 135–145)
Total Bilirubin: 0.4 mg/dL (ref 0.2–1.2)
Total Protein: 6.4 g/dL (ref 6.0–8.3)

## 2024-04-17 LAB — MICROALBUMIN / CREATININE URINE RATIO
Creatinine,U: 73.3 mg/dL
Microalb Creat Ratio: 737.4 mg/g — ABNORMAL HIGH (ref 0.0–30.0)
Microalb, Ur: 54 mg/dL — ABNORMAL HIGH (ref 0.0–1.9)

## 2024-04-20 LAB — FRUCTOSAMINE: Fructosamine: 243 umol/L (ref 205–285)

## 2024-04-22 ENCOUNTER — Encounter: Payer: Self-pay | Admitting: Family Medicine

## 2024-04-22 ENCOUNTER — Ambulatory Visit (INDEPENDENT_AMBULATORY_CARE_PROVIDER_SITE_OTHER): Admitting: Family Medicine

## 2024-04-22 VITALS — BP 144/70 | HR 60 | Temp 98.2°F | Ht 68.0 in | Wt 195.0 lb

## 2024-04-22 DIAGNOSIS — Z7189 Other specified counseling: Secondary | ICD-10-CM

## 2024-04-22 DIAGNOSIS — R399 Unspecified symptoms and signs involving the genitourinary system: Secondary | ICD-10-CM

## 2024-04-22 DIAGNOSIS — J432 Centrilobular emphysema: Secondary | ICD-10-CM

## 2024-04-22 DIAGNOSIS — E782 Mixed hyperlipidemia: Secondary | ICD-10-CM

## 2024-04-22 DIAGNOSIS — E1169 Type 2 diabetes mellitus with other specified complication: Secondary | ICD-10-CM | POA: Diagnosis not present

## 2024-04-22 DIAGNOSIS — Z0000 Encounter for general adult medical examination without abnormal findings: Secondary | ICD-10-CM | POA: Diagnosis not present

## 2024-04-22 DIAGNOSIS — E785 Hyperlipidemia, unspecified: Secondary | ICD-10-CM

## 2024-04-22 DIAGNOSIS — Z794 Long term (current) use of insulin: Secondary | ICD-10-CM

## 2024-04-22 DIAGNOSIS — Z7984 Long term (current) use of oral hypoglycemic drugs: Secondary | ICD-10-CM

## 2024-04-22 MED ORDER — DOXYCYCLINE HYCLATE 100 MG PO TABS: 100.0000 mg | ORAL_TABLET | Freq: Two times a day (BID) | ORAL | 0 refills | Status: DC

## 2024-04-22 MED ORDER — TAMSULOSIN HCL 0.4 MG PO CAPS: 0.4000 mg | ORAL_CAPSULE | Freq: Every day | ORAL | 3 refills | Status: AC
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
## 2024-04-22 NOTE — Patient Instructions (Addendum)
 Start doxycycline  with food.  Keep taking mucinex  with water.  Update me if not better.   When better, try taking flomax.  Let me know if not tolerated/if not urinating better.   Flu shot when feeling better.   Recheck in about 6 months.  Labs before the visit.   Take care.  Glad to see you.

## 2024-04-25 DIAGNOSIS — R399 Unspecified symptoms and signs involving the genitourinary system: Secondary | ICD-10-CM | POA: Insufficient documentation

## 2024-04-25 NOTE — Assessment & Plan Note (Signed)
 Continue atorvastatin .  Continue fenofibrate .  Labs discussed with patient.

## 2024-04-25 NOTE — Assessment & Plan Note (Signed)
 Discussed trying Flomax with routine cautions.  He can let me know if not tolerated/if not urinating better.

## 2024-04-25 NOTE — Assessment & Plan Note (Signed)
 Colonoscopy d/w pt, would defer age >40.  No bloody or black stools.   Tetanus and shingles may be cheaper at pharmacy, d/w pt.   Flu d/w pt.   PNA up to date.   RSV and covid vaccines d/w pt.  Prostate cancer screening and PSA options (with potential risks and benefits of testing vs not testing) were discussed along with recent recs/guidelines.  He declined testing PSA at this point. Advance directive- wife designated if patient were incapacitated.

## 2024-04-25 NOTE — Assessment & Plan Note (Signed)
 Advance directive- wife designated if patient were incapacitated.

## 2024-04-25 NOTE — Assessment & Plan Note (Signed)
 MALB elevated, similar to prior.  On ACE. D/w pt about options.  He is already dealing with nocturia and frequency/urgency.   Still on metformin  and insulin  at baseline.  Fructosamine converts to A1c of approximately 5.7. d/w pt at OV.   Defer adding on Jardiance or similar at this point given his urinary symptoms.  Recheck periodically.  See urinary symptom discussion.

## 2024-04-25 NOTE — Assessment & Plan Note (Signed)
 Discussed options.  Continue inhalers at baseline. Start doxycycline  with food.  Keep taking mucinex  with water.  Update me if not better.  He agrees to plan.

## 2024-05-09 ENCOUNTER — Other Ambulatory Visit: Payer: Self-pay | Admitting: Family Medicine

## 2024-05-09 DIAGNOSIS — E1169 Type 2 diabetes mellitus with other specified complication: Secondary | ICD-10-CM

## 2024-06-11 ENCOUNTER — Encounter: Payer: Self-pay | Admitting: Pharmacist

## 2024-06-11 ENCOUNTER — Telehealth: Payer: Self-pay | Admitting: Family Medicine

## 2024-06-11 NOTE — Progress Notes (Signed)
 Chart Review Reason: Drug refill question  Summary: Last PAP shipment through [employer] program was picked up by patient in June which was a 90-day supply. Overdue for refill. Patient notes he still has 2 unopened boxes which would be a 30-day supply.   Called [employer] given refill is technically overdue a couple of months.  Representative confirms this was an error on their end. Refill was submitted today. Delivery expected within 7-10 business days.    Manuelita FABIENE Kobs, PharmD Clinical Pharmacist [facility] [phone]

## 2024-06-11 NOTE — Progress Notes (Signed)
Printed and placed in PCP box for review

## 2024-06-11 NOTE — Telephone Encounter (Signed)
 Copied from CRM #[id number]. Topic: General - Other >> Jun 11, 2024  9:48 AM Rosina BIRCH wrote: Reason for CRM: patient called stating he would like to talk to the pharmacist regarding his insulin  medication because he is getting low [phone]

## 2024-06-11 NOTE — Progress Notes (Signed)
 Patient Assistance Program (PAP) Application   Manufacturer: [employer]    (Re-enrollment) Medication(s): Lantus  solostar  Patient Portion of Application:  06/11/24: Mailed to patient's home from clinic per patient request.  Income Documentation: N/A - Electronic verification elected.  Provider Portion of Application:  06/11/24: Provider portion completed by PharmD and uploaded PCP eFax folder for signature.  Prescription(s): Included in application    Next Steps: [x]    Provider pages of Application filled out and uploaded to [facility] eFax folder for review/signature []    Upon PCP signature, Provider page to be faxed to [facility] PAP team at [phone]. Msg sent to clinical pool   Upon receipt of patient pages via mail, can be faxed to [facility] PAP team at [phone].

## 2024-06-12 NOTE — Progress Notes (Signed)
 I will work on [employer].  Thanks.

## 2024-06-19 ENCOUNTER — Ambulatory Visit: Payer: Self-pay

## 2024-06-19 NOTE — Telephone Encounter (Signed)
 FYI Only or Action Required?: Action required by provider: request for appointment.- scheduled for Friday due to not wanting to see another provider but wife is requesting a sooner appt.   Patient was last seen in primary care on 04/22/2024 by Cleatus Arlyss RAMAN, MD.  Called Nurse Triage reporting Cough.  Symptoms began several months ago.  Interventions attempted: OTC medications: mucinex  and Prescription medications: inhalers.  Symptoms are: gradually worsening.  Triage Disposition: See HCP Within 4 Hours (Or PCP Triage)  Patient/caregiver understands and will follow disposition?: No, wishes to speak with PCP   Copied from CRM #[id number]. Topic: Clinical - Red Word Triage >> Jun 19, 2024  8:39 AM [name] wrote: Red Word that prompted transfer to Nurse Triage: congested ,trouble breathing,not able to sleep Reason for Disposition  [1] MILD difficulty breathing (e.g., minimal/no SOB at rest, SOB with walking, pulse < 100) AND [2] still present when not coughing  Answer Assessment - Initial Assessment Questions Wife called in for pt. Cough ongoing, states they saw Dr. Cleatus for it last month and he gave him meds. Appears he saw Dr. Cleatus in October and it was going on for a month prior to that. She states cough is worse, head congestion, not sleeping due to the cough, non productive. She states he has some hard breathing at times when he is active and after coughing, states he is always wheezy, inhaler helps a little but doesn't last long. RN confirmed twice he is taking symbicort  twice a day. She denies any fever or chest pain. At the end of the conversation she said so he can take both. RN advised the symbicort  is a maintenance inhaler and is scheduled meaning he needs to take it 2 times a day. The other is as needed. Rn did give instructions on when to go to the ER and some care advise. Rn offered to look for appt for today or tomorrow she declined due to husband wanting to see Dr. Cleatus. Rn  stated understanding. At the end of conversation she stated she would really like or him to be seen sooner. RN offered again to schedule with someone else or for him to be seen in UC. She asked if RN could talk to Dr. Cleatus and just see if he can be seen sooner. RN advised this message would be sent to his clinical staff. She stated understanding.    1. ONSET: When did the cough begin?      3 months ago 2. SEVERITY: How bad is the cough today?      Getting worse 3. SPUTUM: Describe the color of your sputum (e.g., none, dry cough; clear, white, yellow, green)     no 4. HEMOPTYSIS: Are you coughing up any blood? If Yes, ask: How much? (e.g., flecks, streaks, tablespoons, etc.)     no 5. DIFFICULTY BREATHING: Are you having difficulty breathing? If Yes, ask: How bad is it? (e.g., mild, moderate, severe)      Worse with activity 6. FEVER: Do you have a fever? If Yes, ask: What is your temperature, how was it measured, and when did it start?     denies 7. CARDIAC HISTORY: Do you have any history of heart disease? (e.g., heart attack, congestive heart failure)      Cad, nstemi 8. LUNG HISTORY: Do you have any history of lung disease?  (e.g., pulmonary embolus, asthma, emphysema)     Copd 9. OTHER SYMPTOMS: Do you have any other symptoms? (e.g., runny nose, wheezing,  chest pain)       Head congestion  Protocols used: Cough - Acute Non-Productive-A-AH

## 2024-06-20 ENCOUNTER — Telehealth: Payer: Self-pay

## 2024-06-20 NOTE — Progress Notes (Signed)
 [name]                                          MRN: [id number]   06/20/2024   The VBCI Quality Team Specialist reviewed this patient medical record for the purposes of chart review for care gap closure. The following were reviewed: abstraction for care gap closure-kidney health evaluation for diabetes:eGFR  and uACR.    VBCI Quality Team

## 2024-06-20 NOTE — Telephone Encounter (Signed)
 Copied from CRM #[id number]. Topic: General - Other >> Jun 19, 2024 12:34 PM Alfonso HERO wrote: Reason for CRM: [place] from [employer] calling because page 2 of the application for insulin  glargine (LANTUS  SOLOSTAR) 100 UNIT/ML Solostar Pen is missing and needs to be faxed to [phone].

## 2024-06-21 ENCOUNTER — Encounter: Payer: Self-pay | Admitting: Family Medicine

## 2024-06-21 ENCOUNTER — Ambulatory Visit: Admitting: Family Medicine

## 2024-06-21 ENCOUNTER — Ambulatory Visit
Admission: RE | Admit: 2024-06-21 | Discharge: 2024-06-21 | Disposition: A | Source: Ambulatory Visit | Attending: Family Medicine | Admitting: Family Medicine

## 2024-06-21 VITALS — BP 128/70 | HR 61 | Temp 98.0°F | Ht 68.0 in | Wt 199.1 lb

## 2024-06-21 DIAGNOSIS — E785 Hyperlipidemia, unspecified: Secondary | ICD-10-CM

## 2024-06-21 DIAGNOSIS — R059 Cough, unspecified: Secondary | ICD-10-CM

## 2024-06-21 DIAGNOSIS — Z7984 Long term (current) use of oral hypoglycemic drugs: Secondary | ICD-10-CM | POA: Diagnosis not present

## 2024-06-21 DIAGNOSIS — E1169 Type 2 diabetes mellitus with other specified complication: Secondary | ICD-10-CM

## 2024-06-21 MED ORDER — ALBUTEROL SULFATE HFA 108 (90 BASE) MCG/ACT IN AERS: 2.0000 | INHALATION_SPRAY | Freq: Three times a day (TID) | RESPIRATORY_TRACT | 2 refills | Status: AC | PRN

## 2024-06-21 NOTE — Patient Instructions (Signed)
 Xray on the way out.   Use albuterol  and see if that helps.  Either way, let me know.   We may need to change lisinopril .  Take care.  Glad to see you.

## 2024-06-21 NOTE — Progress Notes (Signed)
 Worse cough over the last few months.   Still on symbicort  daily.  No recent SABA use.  On ACE.  Dry episodic cough, more at night.  No fevers, no chills.  He doesn't feel unwell.  Sleep disrupted from the cough.  Some occ wheeze at night. He is rinsing after using symbicort .    Nocturia is better with flomax .    He needs help with hard copy paperwork for lantus .  I told him I would check on that. Taking 100 units daily.    Meds, vitals, and allergies reviewed.   ROS: Per HPI unless specifically indicated in ROS section   Nad [place] Neck supple, no LA Rrr Ctab No wheeze.  No focal decrease in breath sounds. Abdomen soft. Well-perfused. No edema.

## 2024-06-23 NOTE — Assessment & Plan Note (Signed)
 I will check his paperwork regarding Lantus .  See hardcopy.

## 2024-06-23 NOTE — Assessment & Plan Note (Addendum)
 Ddx d/w pt, not limited to but including ACE COPD GERD.    Discussed options.  Xray on the way out.   He can use albuterol  and see if that helps.  Either way, let me know.   We may need to change lisinopril .  He does not have typical heartburn symptoms.

## 2024-06-24 ENCOUNTER — Telehealth: Payer: Self-pay | Admitting: Family Medicine

## 2024-06-24 ENCOUNTER — Telehealth: Payer: Self-pay

## 2024-06-24 NOTE — Telephone Encounter (Signed)
 Noted, would continue with albuterol  for now.  If the cough doesn't resolve with this after another week, then let me know so we can see about other options with his inhalers.  Thanks.

## 2024-06-24 NOTE — Telephone Encounter (Signed)
 Gave pt a call provider office is asking [employer] is missing pg #2 of pt portion,spoke with pt explain he has left his portion at provider office last Thursday.

## 2024-06-24 NOTE — Telephone Encounter (Signed)
 Called patient reviewed all information and repeated back to me. Will call if any questions.  States that the albuterol  is helping some with the cough. He hopes it will keep improving over the next few days.

## 2024-06-24 NOTE — Telephone Encounter (Signed)
 Relayed the message to [name] his wife ([place]). No further action needed

## 2024-06-24 NOTE — Telephone Encounter (Signed)
 Please check with patient.  I am awaiting the overread from radiology for his chest x-ray.  I looked at the images.  I do not see any new findings.  Let me know if albuterol  helped his cough at all.  Thanks.

## 2024-07-03 ENCOUNTER — Ambulatory Visit: Payer: Self-pay | Admitting: Family Medicine

## 2024-07-15 NOTE — Telephone Encounter (Signed)
 Please check on status of paper work and contact pt.     Copied from CRM #[id number]. Topic: Clinical - Prescription Issue >> Jul 15, 2024  4:35 PM [name] wrote: Reason for CRM: Patient calling for update on paperwork he left to be faxed in for a program for [employer] for Lancets , Patient states he needs that turned in and he is almost out of the insulin  , patient requests a call back on this

## 2024-07-17 NOTE — Telephone Encounter (Signed)
 Call [employer] to follow up on PAP (Lantus ) spoke with representative explain they still have not received missing imf pg #2 of pt portion,faxed to [employer] missing Imf pg2 today.representative said they have mail out a refill to pt home.

## 2024-07-23 ENCOUNTER — Telehealth: Payer: Self-pay | Admitting: Family Medicine

## 2024-07-23 NOTE — Telephone Encounter (Unsigned)
 Copied from CRM #[id number]. Topic: General - Other >> Jul 23, 2024  9:04 AM Tiffini S wrote: Reason for CRM: Patient is on a program where he gets free insulin - document was taken to the office during the last visit- patient is getting low on insulin   Patient spouse Apolinar is calling for a update about the paperwork  Please call [phone]

## 2024-07-24 ENCOUNTER — Telehealth: Payer: Self-pay

## 2024-07-24 NOTE — Telephone Encounter (Signed)
 Patient picked up medication

## 2024-07-24 NOTE — Telephone Encounter (Signed)
 Received patient assistance medications for patient.  Lantus300Iu/3ML INJ SOLO5 RM M30 US * 6boxes  Medications have been placed in the refrigerator and labeled with patient information  and Patient has been informed can pick up medications in our office during normal business hours.

## 2024-08-01 ENCOUNTER — Telehealth: Payer: Self-pay

## 2024-08-01 NOTE — Telephone Encounter (Signed)
 Copied from CRM #[id number]. Topic: General - Other >> Aug 01, 2024 10:18 AM Mercedes MATSU wrote: Reason for CRM: [facility] Patient Assistance Program [facility]) called in needing the maximum dosage changed for this lantus  solar star on the patients paper frequency is not needed.     CB: [phone]

## 2024-08-02 ENCOUNTER — Other Ambulatory Visit: Payer: Self-pay | Admitting: Family Medicine

## 2024-08-02 MED ORDER — LANTUS SOLOSTAR 100 UNIT/ML ~~LOC~~ SOPN: PEN_INJECTOR | SUBCUTANEOUS | 12 refills | Status: AC

## 2024-08-02 NOTE — Progress Notes (Signed)
 Rx printed for lantus .  Please hold rx.  I'll sign next week when back in clinic.  Please send with paperwork for rx assistance.  Thanks.

## 2024-08-02 NOTE — Progress Notes (Signed)
 All information and ppw placed in your box for review.  No further action needed at this time.

## 2024-08-05 NOTE — Telephone Encounter (Signed)
 Received a letter form [employer] requesting missing Imf on provider portion to removed the frequency and only submit the max dose .fixed and faxed back to [employer]

## 2024-08-07 NOTE — Telephone Encounter (Signed)
 It looks like this has been addressed, based on the comments here.  Thanks.

## 2024-10-14 ENCOUNTER — Other Ambulatory Visit

## 2024-10-21 ENCOUNTER — Ambulatory Visit: Admitting: Family Medicine

## 2025-02-26 ENCOUNTER — Ambulatory Visit

## 2025-03-03 ENCOUNTER — Encounter (INDEPENDENT_AMBULATORY_CARE_PROVIDER_SITE_OTHER)

## 2025-03-03 ENCOUNTER — Ambulatory Visit (INDEPENDENT_AMBULATORY_CARE_PROVIDER_SITE_OTHER): Admitting: Vascular Surgery
